# Patient Record
Sex: Male | Born: 1980
Health system: Southern US, Community
[De-identification: ages and names within clinical notes are randomized; demographics above are authoritative.]

## PROBLEM LIST (undated history)

## (undated) DIAGNOSIS — I428 Other cardiomyopathies: Secondary | ICD-10-CM

## (undated) DIAGNOSIS — R059 Cough, unspecified: Secondary | ICD-10-CM

## (undated) DIAGNOSIS — I480 Paroxysmal atrial fibrillation: Secondary | ICD-10-CM

## (undated) DIAGNOSIS — I509 Heart failure, unspecified: Secondary | ICD-10-CM

## (undated) DIAGNOSIS — I639 Cerebral infarction, unspecified: Secondary | ICD-10-CM

## (undated) DIAGNOSIS — Q2112 Patent foramen ovale: Secondary | ICD-10-CM

## (undated) DIAGNOSIS — Z87898 Personal history of other specified conditions: Secondary | ICD-10-CM

## (undated) DIAGNOSIS — R05 Cough: Secondary | ICD-10-CM

## (undated) DIAGNOSIS — Q211 Atrial septal defect: Secondary | ICD-10-CM

## (undated) HISTORY — DX: Other cardiomyopathies: I42.8

## (undated) HISTORY — DX: Cough, unspecified: R05.9

## (undated) HISTORY — PX: NO PAST SURGERIES: SHX2092

## (undated) HISTORY — DX: Paroxysmal atrial fibrillation: I48.0

## (undated) HISTORY — DX: Cough: R05

---
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
## 2001-01-20 ENCOUNTER — Encounter: Admission: RE | Admit: 2001-01-20 | Discharge: 2001-01-20 | Payer: Self-pay | Admitting: Family Medicine

## 2002-06-05 ENCOUNTER — Emergency Department (HOSPITAL_COMMUNITY): Admission: EM | Admit: 2002-06-05 | Discharge: 2002-06-06 | Payer: Self-pay | Admitting: Emergency Medicine

## 2002-06-06 ENCOUNTER — Encounter: Payer: Self-pay | Admitting: Emergency Medicine

## 2002-07-13 ENCOUNTER — Encounter: Admission: RE | Admit: 2002-07-13 | Discharge: 2002-07-13 | Payer: Self-pay | Admitting: Family Medicine

## 2002-07-20 ENCOUNTER — Encounter: Admission: RE | Admit: 2002-07-20 | Discharge: 2002-07-20 | Payer: Self-pay | Admitting: Sports Medicine

## 2003-11-14 ENCOUNTER — Encounter: Admission: RE | Admit: 2003-11-14 | Discharge: 2003-11-14 | Payer: Self-pay | Admitting: Family Medicine

## 2003-11-17 ENCOUNTER — Encounter: Admission: RE | Admit: 2003-11-17 | Discharge: 2003-11-17 | Payer: Self-pay | Admitting: Sports Medicine

## 2004-07-27 ENCOUNTER — Emergency Department (HOSPITAL_COMMUNITY): Admission: EM | Admit: 2004-07-27 | Discharge: 2004-07-27 | Payer: Self-pay | Admitting: Emergency Medicine

## 2005-02-15 ENCOUNTER — Emergency Department (HOSPITAL_COMMUNITY): Admission: EM | Admit: 2005-02-15 | Discharge: 2005-02-15 | Payer: Self-pay | Admitting: Emergency Medicine

## 2006-09-04 DIAGNOSIS — F909 Attention-deficit hyperactivity disorder, unspecified type: Secondary | ICD-10-CM | POA: Insufficient documentation

## 2008-12-19 ENCOUNTER — Ambulatory Visit: Payer: Self-pay | Admitting: Diagnostic Radiology

## 2008-12-19 ENCOUNTER — Emergency Department (HOSPITAL_BASED_OUTPATIENT_CLINIC_OR_DEPARTMENT_OTHER): Admission: EM | Admit: 2008-12-19 | Discharge: 2008-12-19 | Payer: Self-pay | Admitting: Emergency Medicine

## 2009-01-09 ENCOUNTER — Emergency Department (HOSPITAL_BASED_OUTPATIENT_CLINIC_OR_DEPARTMENT_OTHER): Admission: EM | Admit: 2009-01-09 | Discharge: 2009-01-09 | Payer: Self-pay | Admitting: Emergency Medicine

## 2009-05-22 ENCOUNTER — Emergency Department (HOSPITAL_BASED_OUTPATIENT_CLINIC_OR_DEPARTMENT_OTHER): Admission: EM | Admit: 2009-05-22 | Discharge: 2009-05-22 | Payer: Self-pay | Admitting: Emergency Medicine

## 2009-05-24 ENCOUNTER — Emergency Department (HOSPITAL_BASED_OUTPATIENT_CLINIC_OR_DEPARTMENT_OTHER): Admission: EM | Admit: 2009-05-24 | Discharge: 2009-05-24 | Payer: Self-pay | Admitting: Emergency Medicine

## 2009-05-24 ENCOUNTER — Ambulatory Visit: Payer: Self-pay | Admitting: Diagnostic Radiology

## 2010-10-10 LAB — BASIC METABOLIC PANEL
GFR calc non Af Amer: >60 mL/min (ref >60)
Glucose, Bld: 74 mg/dL (ref 70–99)
Potassium: 4.2 mEq/L (ref 3.5–5.1)
Sodium: 144 mEq/L (ref 135–145)

## 2010-10-10 LAB — CBC
HCT: 45.8 % (ref 39.0–52.0)
Hemoglobin: 15.3 g/dL (ref 13.0–17.0)
WBC: 6.6 10*3/uL (ref 4.0–10.5)

## 2010-10-10 LAB — DIFFERENTIAL
Eosinophils Relative: 1 % (ref 0–5)
Lymphocytes Relative: 24 % (ref 12–46)
Lymphs Abs: 1.6 10*3/uL (ref 0.7–4.0)
Monocytes Absolute: 0.5 10*3/uL (ref 0.1–1.0)

## 2010-10-10 LAB — POCT CARDIAC MARKERS
CKMB, poc: <1 ng/mL — ABNORMAL LOW (ref 1.0–8.0)
Troponin i, poc: <0.05 ng/mL (ref 0.00–0.09)

## 2011-02-03 ENCOUNTER — Emergency Department (HOSPITAL_BASED_OUTPATIENT_CLINIC_OR_DEPARTMENT_OTHER)
Admission: EM | Admit: 2011-02-03 | Discharge: 2011-02-03 | Disposition: A | Discharge status: Home / Self Care | Payer: 59 | Attending: Emergency Medicine | Admitting: Emergency Medicine

## 2011-02-03 ENCOUNTER — Encounter: Payer: Self-pay | Admitting: *Deleted

## 2011-02-03 DIAGNOSIS — R369 Urethral discharge, unspecified: Secondary | ICD-10-CM | POA: Insufficient documentation

## 2011-02-03 DIAGNOSIS — N342 Other urethritis: Secondary | ICD-10-CM | POA: Insufficient documentation

## 2011-02-03 LAB — URINE MICROSCOPIC-ADD ON

## 2011-02-03 LAB — URINALYSIS, ROUTINE W REFLEX MICROSCOPIC
Protein, ur: NEGATIVE mg/dL
Urobilinogen, UA: 1 mg/dL (ref 0.0–1.0)

## 2011-02-03 MED ORDER — LIDOCAINE HCL (PF) 1 % IJ SOLN
INTRAMUSCULAR | Status: AC
  Administered 2011-02-03: 5 mL via INTRAMUSCULAR
  Filled 2011-02-03: qty 5

## 2011-02-03 MED ORDER — CEFTRIAXONE SODIUM 250 MG IJ SOLR
250.0000 mg | Freq: Once | INTRAMUSCULAR | Status: AC
  Administered 2011-02-03: 250 mg via INTRAMUSCULAR
  Filled 2011-02-03: qty 250

## 2011-02-03 MED ORDER — AZITHROMYCIN 250 MG PO TABS
1000.0000 mg | ORAL_TABLET | Freq: Every day | ORAL | Status: DC
  Administered 2011-02-03 (×2): 1000 mg via ORAL
  Filled 2011-02-03: qty 4

## 2011-02-03 MED ORDER — AZITHROMYCIN 1 G PO PACK: 1.0000 g | PACK | Freq: Once | ORAL | Status: DC

## 2011-02-03 MED ORDER — SULFAMETHOXAZOLE-TRIMETHOPRIM 800-160 MG PO TABS: 1.0000 | ORAL_TABLET | Freq: Two times a day (BID) | ORAL | Status: AC

## 2011-02-03 NOTE — ED Provider Notes (Signed)
History     Chief Complaint  Patient presents with  . Penile Discharge   HPI Comments: Pt states that he has a burning sensation and a white discharge:pt states that he has a history of std, but this symptoms are different  Patient is a 30 y.o. male presenting with penile discharge. The history is provided by the patient.  Penile Discharge This is a new problem. The current episode started in the past 7 days. The problem occurs constantly. The problem has been unchanged. Pertinent negatives include no abdominal pain or fever. The symptoms are aggravated by nothing. He has tried nothing for the symptoms.    History reviewed. No pertinent past medical history.  History reviewed. No pertinent past surgical history.  No family history on file.  History  Substance Use Topics  . Smoking status: Never Smoker   . Smokeless tobacco: Not on file  . Alcohol Use: No      Review of Systems  Constitutional: Negative for fever.  Gastrointestinal: Negative for abdominal pain.  Genitourinary: Positive for discharge.  All other systems reviewed and are negative.    Physical Exam  BP 128/84  Pulse 80  Temp(Src) 98.4 F (36.9 C) (Oral)  Resp 20  SpO2 100%  Physical Exam  Nursing note and vitals reviewed. Constitutional: He appears well-developed and well-nourished.  Cardiovascular: Normal rate and regular rhythm.   Pulmonary/Chest: Effort normal and breath sounds normal.  Abdominal: Soft. Bowel sounds are normal.  Genitourinary: Penis normal. No penile tenderness. No discharge found.  Musculoskeletal: Normal range of motion.  Neurological: He is alert.  Skin: Skin is warm and dry.  Psychiatric: He has a normal mood and affect.    ED Course  Procedures  MDM Will treat pt for urethritis:no penile discharge noted:pt given instructions      Teressa Lower, NP 02/03/11 1813

## 2011-02-03 NOTE — ED Notes (Signed)
Pain in his penis x 1 week. Discharge.

## 2011-02-04 NOTE — ED Provider Notes (Signed)
Medical screening examination/treatment/procedure(s) were performed by non-physician practitioner and as supervising physician I was immediately available for consultation/collaboration.   Vedansh Kerstetter, MD 02/04/11 0000 

## 2011-02-05 LAB — GC/CHLAMYDIA PROBE AMP, GENITAL: GC Probe Amp, Genital: NEGATIVE

## 2011-02-07 NOTE — ED Notes (Signed)
Chart sent to EDP office for review of chlamydia culture results

## 2011-02-08 NOTE — ED Notes (Signed)
Message left to return call.

## 2011-02-08 NOTE — ED Notes (Signed)
Patient called and notified of results and instructions.  Zithromax 1 gram PO x 1 dose called into [facility] at [phone].

## 2011-05-27 ENCOUNTER — Emergency Department (HOSPITAL_BASED_OUTPATIENT_CLINIC_OR_DEPARTMENT_OTHER)
Admission: EM | Admit: 2011-05-27 | Discharge: 2011-05-27 | Disposition: A | Discharge status: Home / Self Care | Payer: 59 | Attending: Emergency Medicine | Admitting: Emergency Medicine

## 2011-05-27 ENCOUNTER — Encounter (HOSPITAL_BASED_OUTPATIENT_CLINIC_OR_DEPARTMENT_OTHER): Payer: Self-pay

## 2011-05-27 DIAGNOSIS — Z202 Contact with and (suspected) exposure to infections with a predominantly sexual mode of transmission: Secondary | ICD-10-CM | POA: Insufficient documentation

## 2011-05-27 DIAGNOSIS — N451 Epididymitis: Secondary | ICD-10-CM

## 2011-05-27 DIAGNOSIS — N453 Epididymo-orchitis: Secondary | ICD-10-CM | POA: Insufficient documentation

## 2011-05-27 MED ORDER — CIPROFLOXACIN HCL 500 MG PO TABS
500.0000 mg | ORAL_TABLET | Freq: Once | ORAL | Status: AC
  Administered 2011-05-27: 500 mg via ORAL
  Filled 2011-05-27: qty 1

## 2011-05-27 MED ORDER — AZITHROMYCIN 250 MG PO TABS
1000.0000 mg | ORAL_TABLET | Freq: Once | ORAL | Status: AC
  Administered 2011-05-27: 1000 mg via ORAL
  Filled 2011-05-27: qty 4

## 2011-05-27 MED ORDER — METRONIDAZOLE 500 MG PO TABS
2000.0000 mg | ORAL_TABLET | Freq: Once | ORAL | Status: AC
  Administered 2011-05-27: 2000 mg via ORAL
  Filled 2011-05-27: qty 4

## 2011-05-27 MED ORDER — CEFTRIAXONE SODIUM 250 MG IJ SOLR
250.0000 mg | Freq: Once | INTRAMUSCULAR | Status: AC
  Administered 2011-05-27: 250 mg via INTRAMUSCULAR
  Filled 2011-05-27: qty 250

## 2011-05-27 MED ORDER — CIPROFLOXACIN HCL 500 MG PO TABS: 500.0000 mg | ORAL_TABLET | Freq: Two times a day (BID) | ORAL | Status: AC

## 2011-05-27 NOTE — ED Provider Notes (Signed)
History  This chart was scribed for Dayton Bailiff, MD by Bennett Scrape. This patient was seen in room MH06/MH06 and the patient's care was started at 6:08PM.  CSN: [id number] Arrival date & time: 05/27/2011  5:57 PM   First MD Initiated Contact with Patient 05/27/11 1757      Chief Complaint  Patient presents with  . Exposure to STD    HPI Nathan Baird is a 30 y.o. male who presents to the Emergency Department complaining of an exposure to an STD that occurred a few days ago after an unprotected sexual encounter with a partner that has chlamydia. Pt describes having a constant pain in his testicles described as a squeezing discomfort. Pt denies having penile discharge. Pt states that sleeping improves the pain. Pt denies any other symptoms or injuries.  Some mild dysuria at the end of his stream   History reviewed. No pertinent past medical history.  History reviewed. No pertinent past surgical history.  No family history on file.  History  Substance Use Topics  . Smoking status: Never Smoker   . Smokeless tobacco: Not on file  . Alcohol Use: No     Review of Systems A complete 10 system review of systems was obtained and is otherwise negative except as noted in the HPI.   Allergies  Review of patient's allergies indicates no known allergies.  Home Medications   Current Outpatient Rx  Name Route Sig Dispense Refill  . CIPROFLOXACIN HCL 500 MG PO TABS Oral Take 1 tablet (500 mg total) by mouth 2 (two) times daily. 14 tablet 0    BP 129/84  Pulse 94  Temp(Src) 98.4 F (36.9 C) (Oral)  Resp 16  Ht 6\' 2"  (1.88 m)  Wt 231 lb (104.781 kg)  BMI 29.66 kg/m2  SpO2 100%  Physical Exam  Nursing note and vitals reviewed. Constitutional: He is oriented to person, place, and time. He appears well-developed and well-nourished.  HENT:  Head: Normocephalic and atraumatic.  Eyes: EOM are normal. Pupils are equal, round, and reactive to light.  Neck: Normal range of  motion. Neck supple.  Cardiovascular: Normal rate and regular rhythm.   Pulmonary/Chest: Effort normal and breath sounds normal.  Abdominal: Soft.  Genitourinary:       Mild left epidemal tenderness, no penile discharge  Musculoskeletal: Normal range of motion.  Neurological: He is alert and oriented to person, place, and time.  Skin: Skin is warm and dry.  Psychiatric: He has a normal mood and affect. His behavior is normal.    ED Course  Procedures (including critical care time)  DIAGNOSTIC STUDIES: Oxygen Saturation is 100% on room air, normal by my interpretation.    COORDINATION OF CARE: 6:11PM-Discussed medication and antibiotic treatment with patient at bedside and patient agreed to plan.      Labs Reviewed  GC/CHLAMYDIA PROBE AMP, GENITAL   No results found.   1. Exposure to STD   2. Epididymitis       MDM  Patient was treated for his STD exposure. I also feel he has a component of epididymitis. He was swabbed for GC and Chlamydia. I treated with ceftriaxone, azithromycin, Flagyl. He'll be discharged home on a short course of ciprofloxacin. He is instructed to followup with his primary care physician and inform his partners of his diagnosis.      I personally performed the services described in this documentation, which was scribed in my presence. The recorded information has been reviewed and considered.  Dayton Bailiff, MD 05/27/11 [phone]

## 2011-05-27 NOTE — ED Notes (Signed)
Pt reports penile discomfort after having unprotected sex.  He reports partner has chlamydia and trich.

## 2011-05-27 NOTE — ED Notes (Signed)
EDP King at bedside to assess

## 2011-05-27 NOTE — ED Notes (Signed)
Pt given Rx x 1 for cipro

## 2011-05-28 LAB — GC/CHLAMYDIA PROBE AMP, GENITAL
Chlamydia, DNA Probe: NEGATIVE
GC Probe Amp, Genital: NEGATIVE

## 2011-09-06 ENCOUNTER — Encounter (HOSPITAL_BASED_OUTPATIENT_CLINIC_OR_DEPARTMENT_OTHER): Payer: Self-pay | Admitting: *Deleted

## 2011-09-06 ENCOUNTER — Emergency Department (HOSPITAL_BASED_OUTPATIENT_CLINIC_OR_DEPARTMENT_OTHER)
Admission: EM | Admit: 2011-09-06 | Discharge: 2011-09-06 | Disposition: A | Discharge status: Home / Self Care | Payer: 59 | Attending: Emergency Medicine | Admitting: Emergency Medicine

## 2011-09-06 DIAGNOSIS — R300 Dysuria: Secondary | ICD-10-CM | POA: Insufficient documentation

## 2011-09-06 DIAGNOSIS — N342 Other urethritis: Secondary | ICD-10-CM | POA: Insufficient documentation

## 2011-09-06 DIAGNOSIS — R369 Urethral discharge, unspecified: Secondary | ICD-10-CM | POA: Insufficient documentation

## 2011-09-06 DIAGNOSIS — Z202 Contact with and (suspected) exposure to infections with a predominantly sexual mode of transmission: Secondary | ICD-10-CM | POA: Insufficient documentation

## 2011-09-06 LAB — GC/CHLAMYDIA PROBE AMP, GENITAL: Chlamydia, DNA Probe: NEGATIVE

## 2011-09-06 MED ORDER — AZITHROMYCIN 250 MG PO TABS
1000.0000 mg | ORAL_TABLET | Freq: Once | ORAL | Status: AC
  Administered 2011-09-06: 1000 mg via ORAL
  Filled 2011-09-06: qty 4

## 2011-09-06 MED ORDER — LIDOCAINE-EPINEPHRINE 2 %-1:100000 IJ SOLN
2.0000 mL | Freq: Once | INTRAMUSCULAR | Status: AC
  Administered 2011-09-06: 2 mL
  Filled 2011-09-06: qty 1

## 2011-09-06 MED ORDER — CEFTRIAXONE SODIUM 250 MG IJ SOLR
250.0000 mg | Freq: Once | INTRAMUSCULAR | Status: AC
  Administered 2011-09-06: 250 mg via INTRAMUSCULAR
  Filled 2011-09-06: qty 250

## 2011-09-06 NOTE — ED Notes (Signed)
Pt states that he was told by a partner that he was exposed to a STD denies DC but has penile pain

## 2011-09-06 NOTE — ED Notes (Signed)
Received pt. From triage, pt. Alert and oriented, NAD noted, pt. Denies open sores or penial discharge, awaiting EDP

## 2011-09-06 NOTE — ED Notes (Signed)
Pt. Alert and oriented, NAD noted, discharged to home, pt. Ambulatory gait steady,

## 2011-09-06 NOTE — Discharge Instructions (Signed)
You were treated with antibiotics in the ER which should cure your symptoms. The swab results will be back in 1-2 days. No sex until after symptoms have completed resolved, use condoms.  Follow up with primary care doctor in 1 week if symptoms fail to improve/resolve. Return to er if worse, severe pain, high fevers, other concern.  Have any sexual contacts checked by their doctor or health department.   Safer Sex Your caregiver wants you to have this information about the infections that can be transmitted from sexual contact and how to prevent them. The idea behind safer sex is that you can be sexually active, and at the same time reduce the risk of giving or getting a sexually transmitted disease (STD). Every person should be aware of how to prevent him or herself and his or her sex partner from getting an STD. CAUSES OF STDS STDs are transmitted by sharing body fluids, which contain viruses and bacteria. The following fluids all transmit infections during sexual intercourse and sex acts:  Semen.   Saliva.   Urine.   Blood.   Vaginal mucus.  Examples of STDs include:  Chlamydia.   Gonorrhea.   Genital herpes.   Hepatitis B.   Human immunodeficiency virus or acquired immunodeficiency syndrome (HIV or AIDS).   Syphilis.   Trichomonas.   Pubic lice.   Human papillomavirus (HPV), which may include:   Genital warts.   Cervical dysplasia.   Cervical cancer (can develop with certain types of HPV).  SYMPTOMS  Sexual diseases often cause few or no symptoms until they are advanced, so a person can be infected and spread the infection without knowing it. Some STDs respond to treatment very well. Others, like HIV and herpes, cannot be cured, but are treated to reduce their effects. Specific symptoms include:  Abnormal vaginal discharge.   Irritation or itching in and around the vagina, and in the pubic hair.   Pain during sexual intercourse.   Bleeding during sexual  intercourse.   Pelvic or abdominal pain.   Fever.   Growths in and around the vagina.   An ulcer in or around the vagina.   Swollen glands in the groin area.  DIAGNOSIS   Blood tests.   Pap test.   Culture test of abnormal vaginal discharge.   A test that applies a solution and examines the cervix with a lighted magnifying scope (colposcopy).   A test that examines the pelvis with a lighted tube, through a small incision (laparoscopy).  TREATMENT  The treatment will depend on the cause of the STD.  Antibiotic treatment by injection, oral, creams, or suppositories in the vagina.   Over-the-counter medicated shampoo, to get rid of pubic lice.   Removing or treating growths with medicine, freezing, burning (electrocautery), or surgery.   Surgery treatment for HPV of the cervix.   Supportive medicines for herpes, HIV, AIDS, and hepatitis.  Being careful cannot eliminate all risk of infection, but sex can be made much safer. Safe sexual practices include body massage and gentle touching. Masturbation is safe, as long as body fluids do not contact skin that has sores or cuts. Dry kissing and oral sex on a man wearing a latex condom or on a woman wearing a male condom is also safe. Slightly less safe is intercourse while the man wears a latex condom or wet kissing. It is also safer to have one sex partner that you know is not having sex with anyone else. LENGTH OF ILLNESS An  STD might be treated and cured in a week, sometimes a month, or more. And it can linger with symptoms for many years. STDs can also cause damage to the male organs. This can cause chronic pain, infertility, and recurrence of the STD, especially herpes, hepatitis, HIV, and HPV. HOME CARE INSTRUCTIONS AND PREVENTION  Alcohol and recreational drugs are often the reason given for not practicing safer sex. These substances affect your judgment. Alcohol and recreational drugs can also impair your immune system,  making you more vulnerable to disease.   Do not engage in risky and dangerous sexual practices, including:   Vaginal or anal sex without a condom.   Oral sex on a man without a condom.   Oral sex on a woman without a male condom.   Using saliva to lubricate a condom.   Any other sexual contact in which body fluids or blood from one partner contact the other partner.   You should use only latex condoms for men and water soluble lubricants. Petroleum based lubricants or oils used to lubricate a condom will weaken the condom and increase the chance that it will break.   Think very carefully before having sex with anyone who is high risk for STDs and HIV. This includes IV drug users, people with multiple sexual partners, or people who have had an STD, or a positive hepatitis or HIV blood test.   Remember that even if your partner has had only one previous partner, their previous partner might have had multiple partners. If so, you are at high risk of being exposed to an STD. You and your sex partner should be the only sex partners with each other, with no one else involved.   A vaccine is available for hepatitis B and HPV through your caregiver or the Public Health Department. Everyone should be vaccinated with these vaccines.   Avoid risky sex practices. Sex acts that can break the skin make you more likely to get an STD.  SEEK MEDICAL CARE IF:   If you think you have an STD, even if you do not have any symptoms. Contact your caregiver for evaluation and treatment, if needed.   You think or know your sex partner has acquired an STD.   You have any of the symptoms mentioned above.  Document Released: 08/01/2004 Document Revised: 03/06/2011 Document Reviewed: 05/24/2009 [facility] Patient Information 2012 [place], [place].    Urethritis, Adult Urethritis is an inflammation (soreness) of the urethra (the tube exiting from the bladder). It is often caused by germs that may be spread  through sexual contact. TREATMENT  Urethritis will usually respond to antibiotics. These are medications that kill germs. Take all the medicine given to you. You may feel better in a couple days, but TAKE ALL MEDICINE or the infection may not be completely cured and may become more difficult to treat. Response can generally be expected in 7 to 10 days. You may require additional treatment after more testing. HOME CARE INSTRUCTIONS  Not have sex until the test results are known and treatment is completed.   Know that you may be asked to notify your sex partner when your final test results are back.   Finish all medications as prescribed.   Prevent sexually transmitted infections including AIDS. Practice safe sex. Use condoms.  SEEK MEDICAL CARE IF:   Your symptoms are not improved in 2 to 3 days.   Your symptoms are getting worse.   Your develop abdominal pain.   You develop  joint pain.  SEEK IMMEDIATE MEDICAL CARE IF:   You have a fever.   You develop severe pain in the belly, back or side.   You develop repeated vomiting.  TEST RESULTS Not all test results are available during your visit. If your test results are not back during the visit, make an appointment with your caregiver to find out the results. Do not assume everything is normal if you have not heard from your caregiver or the medical facility. It is important for you to follow-up on all of your test results. Document Released: 12/18/2000 Document Revised: 03/06/2011 Document Reviewed: 07/10/2009 [facility] Patient Information 2012 [place], [place].

## 2011-09-06 NOTE — ED Provider Notes (Signed)
History     CSN: [id number]  Arrival date & time 09/06/11  0047   First MD Initiated Contact with Patient 09/06/11 0203      Chief Complaint  Patient presents with  . Exposure to STD    (Consider location/radiation/quality/duration/timing/severity/associated sxs/prior treatment) Patient is a 31 y.o. male presenting with STD exposure. The history is provided by the patient.  Exposure to STD Pertinent negatives include no abdominal pain.  pt states was recently told by sex partner that he was exposed to an std in past week, unsure of name. States notes pain at end of penis w burning sensation and mild discharge. No scrotal or testicular pain. No abd pain or nv. No fever or chills. No rash.   History reviewed. No pertinent past medical history.  History reviewed. No pertinent past surgical history.  History reviewed. No pertinent family history.  History  Substance Use Topics  . Smoking status: Never Smoker   . Smokeless tobacco: Not on file  . Alcohol Use: No      Review of Systems  Constitutional: Negative for fever and chills.  Gastrointestinal: Negative for vomiting and abdominal pain.  Genitourinary: Positive for discharge.  Skin: Negative for rash and wound.    Allergies  Review of patient's allergies indicates no known allergies.  Home Medications  No current outpatient prescriptions on file.  BP 127/77  Pulse 85  Temp(Src) 98.4 F (36.9 C) (Oral)  Resp 16  SpO2 100%  Physical Exam  Nursing note and vitals reviewed. Constitutional: He is oriented to person, place, and time. He appears well-developed and well-nourished. No distress.  HENT:  Head: Atraumatic.  Eyes: Pupils are equal, round, and reactive to light.  Neck: Neck supple. No tracheal deviation present.  Cardiovascular: Normal rate.   Pulmonary/Chest: Effort normal. No accessory muscle usage. No respiratory distress.  Abdominal: Soft. He exhibits no distension. There is no tenderness.    Genitourinary:       Penile discharge. No scrotal or testicle pain, swelling or tenderness. No skin lesions or ulcers noted.   Musculoskeletal: Normal range of motion.  Neurological: He is alert and oriented to person, place, and time.  Skin: Skin is warm and dry. No rash noted.  Psychiatric: He has a normal mood and affect.    ED Course  Procedures (including critical care time)   Labs Reviewed  GC/CHLAMYDIA PROBE AMP, GENITAL     MDM  Rocephin im, zitrhomax po.         Suzi Roots, MD 09/06/11 [phone]

## 2011-11-17 ENCOUNTER — Ambulatory Visit (INDEPENDENT_AMBULATORY_CARE_PROVIDER_SITE_OTHER): Payer: 59 | Admitting: Family Medicine

## 2011-11-17 ENCOUNTER — Ambulatory Visit: Payer: 59

## 2011-11-17 VITALS — BP 109/78 | HR 65 | Temp 98.1°F | Resp 20 | Ht 74.0 in | Wt 239.4 lb

## 2011-11-17 DIAGNOSIS — S336XXA Sprain of sacroiliac joint, initial encounter: Secondary | ICD-10-CM

## 2011-11-17 DIAGNOSIS — M533 Sacrococcygeal disorders, not elsewhere classified: Secondary | ICD-10-CM

## 2011-11-17 MED ORDER — METHYLPREDNISOLONE 4 MG PO KIT: PACK | ORAL | Status: AC

## 2011-11-17 NOTE — Patient Instructions (Signed)
Sacroiliac Joint Dysfunction The sacroiliac joint connects the lower part of the spine (the sacrum) with the bones of the pelvis. CAUSES  Sometimes, there is no obvious reason for sacroiliac joint dysfunction. Other times, it may occur   During pregnancy.   After injury, such as:   Car accidents.   Sport-related injuries.   Work-related injuries.   Due to one leg being shorter than the other.   Due to other conditions that affect the joints, such as:   Rheumatoid arthritis.   Gout.   Psoriasis.   Joint infection (septic arthritis).  SYMPTOMS  Symptoms may include:  Pain in the:   Lower back.   Buttocks.   Groin.   Thighs and legs.   Difficult sitting, standing, walking, lying, bending or lifting.  DIAGNOSIS  A number of tests may be used to help diagnose the cause of sacroiliac joint dysfunction, including:  Imaging tests to look for other causes of pain, including:   MRI.   CT scan.   Bone scan.   Diagnostic injection: During a special x-ray (called fluoroscopy), a needle is put into the sacroiliac joint. A numbing medicine is injected into the joint. If the pain is improved or stopped, the diagnosis of sacroiliac joint dysfunction is more likely.  TREATMENT  There are a number of types of treatment used for sacroiliac joint dysfunction, including:  Only take over-the-counter or prescription medicines for pain, discomfort, or fever as directed by your caregiver.   Medications to relax muscles.   Rest. Decreasing activity can help cut down on painful muscle spasms and allow the back to heal.   Application of heat or ice to the lower back may improve muscle spasms and soothe pain.   Brace. A special back brace, called a sacroiliac belt, can help support the joint while your back is healing.   Physical therapy can help teach comfortable positions and exercises to strengthen muscles that support the sacroiliac joint.   Cortisone injections. Injections  of steroid medicine into the joint can help decrease swelling and improve pain.   Hyaluronic acid injections. This chemical improves lubrication within the sacroiliac joint, thereby decreasing pain.   Radiofrequency ablation. A special needle is placed into the joint, where it burns away nerves that are carrying pain messages from the joint.   Surgery. Because pain occurs during movement of the joint, screws and plates may be installed in order to limit or prevent joint motion.  HOME CARE INSTRUCTIONS   Take all medications exactly as directed.   Follow instructions regarding both rest and physical activity, to avoid worsening the pain.   Do physical therapy exercises exactly as prescribed.  SEEK IMMEDIATE MEDICAL CARE IF:  You experience increasingly severe pain.   You develop new symptoms, such as numbness or tingling in your legs or feet.   You lose bladder or bowel control.  Document Released: 09/20/2008 Document Revised: 06/13/2011 Document Reviewed: 09/20/2008 [employer] Patient Information 2012 [employer]. 

## 2011-11-17 NOTE — Progress Notes (Signed)
This 31 year old gentleman who fell flat on his back and playing basketball 4-6 weeks ago. He's had continued pain ever since. He works doing roof work with asphalt and has been able to continue working with only intermittent twinges of sharp pain. Nevertheless, he's had sharp pain whenever he sits for a long time on his left buttock, when he jumps or runs, although he tries to sleep at night.  Patient had no bowel or bladder problems no fever, no sensory loss in the left leg, no radiculopathy in the left leg or motor weakness.  Objective: Sacroiliac area not particularly tender.  The crossover exam negative.  Neurological: Normal motor and sensory left leg  Leg raising: Negative left side  Hip on the left: Full range of motion without pain [facility] reading (PRIMARY) by  Dr. Milus Glazier:  L/S spine films  Negative  Assessment: This patient has an SI inflammation because of the sharp intermittent nature of his pain which worsens with weightbearing such as jogging.  Plan: Medrol dose pack.

## 2012-02-10 ENCOUNTER — Ambulatory Visit (INDEPENDENT_AMBULATORY_CARE_PROVIDER_SITE_OTHER): Payer: 59 | Admitting: Family Medicine

## 2012-02-10 VITALS — BP 128/92 | HR 92 | Temp 98.1°F | Resp 14 | Ht 73.0 in | Wt 239.8 lb

## 2012-02-10 DIAGNOSIS — Z202 Contact with and (suspected) exposure to infections with a predominantly sexual mode of transmission: Secondary | ICD-10-CM

## 2012-02-10 DIAGNOSIS — Z9189 Other specified personal risk factors, not elsewhere classified: Secondary | ICD-10-CM

## 2012-02-10 MED ORDER — METRONIDAZOLE 500 MG PO TABS: ORAL_TABLET | ORAL | Status: DC

## 2012-02-10 MED ORDER — AZITHROMYCIN 250 MG PO TABS: ORAL_TABLET | ORAL | Status: AC

## 2012-02-10 NOTE — Progress Notes (Signed)
31 yo male whose partner reports trichamonas and chlamydia test positive.  No symprotms  Objective:  Normal circ genitalia  Assessment:  STD exposure  Plan:  Azithromycin and Flagyl

## 2012-05-19 ENCOUNTER — Ambulatory Visit (INDEPENDENT_AMBULATORY_CARE_PROVIDER_SITE_OTHER): Payer: 59 | Admitting: Family Medicine

## 2012-05-19 VITALS — BP 130/86 | HR 86 | Temp 98.7°F | Resp 16 | Ht 74.0 in | Wt 245.0 lb

## 2012-05-19 DIAGNOSIS — N4889 Other specified disorders of penis: Secondary | ICD-10-CM | POA: Insufficient documentation

## 2012-05-19 DIAGNOSIS — N489 Disorder of penis, unspecified: Secondary | ICD-10-CM

## 2012-05-19 MED ORDER — CEFTRIAXONE SODIUM 1 G IJ SOLR
250.0000 mg | Freq: Once | INTRAMUSCULAR | Status: AC
  Administered 2012-05-19: 250 mg via INTRAMUSCULAR

## 2012-05-19 MED ORDER — AZITHROMYCIN 500 MG PO TABS: 1000.0000 mg | ORAL_TABLET | Freq: Once | ORAL | Status: DC

## 2012-05-19 NOTE — Patient Instructions (Addendum)
Thank you for coming in today. We will test for  HIV,  Syphilis Gonorrhea Chlamydia  We're treating for syphilis gonorrhea and Chlamydia.  We will call you with the test results. Please take the 2 azithromycin pills I called in to your pharmacy Come back as needed.

## 2012-05-19 NOTE — Progress Notes (Signed)
Nathan Baird is a 31 y.o. male who presents to [facility] today for penile and lower abdominal pain present for the last several days.  Patient notes recent unprotected sex with a new partner.  He notes pain at the end of urination that radiates from his lower abdomen to his penis. He denies any discharge fever chills or significant abdominal pain. Additionally he denies any nausea vomiting or diarrhea.  He feels well otherwise.  This is consistent with prior episodes of STDs.  He has sex with women exclusively.   PMH: Reviewed otherwise healthy  History  Substance Use Topics  . Smoking status: Never Smoker   . Smokeless tobacco: Not on file  . Alcohol Use: No   ROS as above  Medications reviewed. Current Outpatient Prescriptions  Medication Sig Dispense Refill  . azithromycin (ZITHROMAX) 500 MG tablet Take 2 tablets (1,000 mg total) by mouth once.  2 tablet  0   Current Facility-Administered Medications  Medication Dose Route Frequency Provider Last Rate Last Dose  . [COMPLETED] cefTRIAXone (ROCEPHIN) injection 250 mg  250 mg Intramuscular Once Nathan Najjar, MD   250 mg at 05/19/12 1829    Exam:  BP 130/86  Pulse 86  Temp 98.7 F (37.1 C)  Resp 16  Ht 6\' 2"  (1.88 m)  Wt 245 lb (111.131 kg)  BMI 31.46 kg/m2 Gen: Well NAD Lungs: CTABL Nl WOB Heart: RRR no MRG Abd: NABS, NT, ND Exts: Non edematous BL  LE, warm and well perfused.  Genitals: Normal appearing circumcised penis nontender with no discharge. Normal testicles nontender. No hernias present bilaterally.   No results found for this or any previous visit (from the past 72 hour(s)).  Assessment and Plan: 31 y.o. male with penile pain. Worrisome for STD with recent exposure. Plan: Discussed options.  Plan for urine application of gonorrhea, Chlamydia. Blood HIV and RPR lab Empiric treatment with IM ceftriaxone 250 mg and oral azithromycin prescription for 1000 mg. Will call patient with results.

## 2012-05-20 LAB — HIV ANTIBODY (ROUTINE TESTING W REFLEX): HIV: NONREACTIVE

## 2012-05-20 LAB — RPR

## 2012-05-21 LAB — GC PROBE AMPLIFICATION, URINE: GC Probe Amp, Urine: NEGATIVE

## 2012-05-26 ENCOUNTER — Encounter: Payer: Self-pay | Admitting: *Deleted

## 2012-05-28 ENCOUNTER — Ambulatory Visit: Payer: 59

## 2012-05-28 ENCOUNTER — Ambulatory Visit (INDEPENDENT_AMBULATORY_CARE_PROVIDER_SITE_OTHER): Payer: 59 | Admitting: Family Medicine

## 2012-05-28 VITALS — BP 112/79 | HR 78 | Temp 98.8°F | Resp 18 | Wt 245.0 lb

## 2012-05-28 DIAGNOSIS — R05 Cough: Secondary | ICD-10-CM

## 2012-05-28 DIAGNOSIS — R079 Chest pain, unspecified: Secondary | ICD-10-CM

## 2012-05-28 DIAGNOSIS — R059 Cough, unspecified: Secondary | ICD-10-CM

## 2012-05-28 DIAGNOSIS — Z8489 Family history of other specified conditions: Secondary | ICD-10-CM

## 2012-05-28 DIAGNOSIS — Z832 Family history of diseases of the blood and blood-forming organs and certain disorders involving the immune mechanism: Secondary | ICD-10-CM

## 2012-05-28 DIAGNOSIS — R053 Chronic cough: Secondary | ICD-10-CM

## 2012-05-28 LAB — POCT CBC
Granulocyte percent: 49.7 % (ref 37–80)
HCT, POC: 42.8 % — AB (ref 43.5–53.7)
Hemoglobin: 13.5 g/dL — AB (ref 14.1–18.1)
Lymph, poc: 3 (ref 0.6–3.4)
MCH, POC: 29.2 pg (ref 27–31.2)
MCHC: 31.5 g/dL — AB (ref 31.8–35.4)
MCV: 92.5 fL (ref 80–97)
MID (cbc): 0.6 (ref 0–0.9)
MPV: 8.1 fL (ref 0–99.8)
POC Granulocyte: 3.6 (ref 2–6.9)
POC LYMPH PERCENT: 41.8 % (ref 10–50)
POC MID %: 8.5 % (ref 0–12)
Platelet Count, POC: 317 10*3/uL (ref 142–424)
RBC: 4.63 M/uL — AB (ref 4.69–6.13)
RDW, POC: 13.5 %
WBC: 7.2 10*3/uL (ref 4.6–10.2)

## 2012-05-28 MED ORDER — BENZONATATE 100 MG PO CAPS
200.0000 mg | ORAL_CAPSULE | Freq: Two times a day (BID) | ORAL | Status: AC | PRN
Start: 2012-05-28 — End: 2012-06-04

## 2012-05-28 MED ORDER — TRAMADOL HCL 50 MG PO TABS: 50.0000 mg | ORAL_TABLET | Freq: Three times a day (TID) | ORAL | Status: DC | PRN

## 2012-05-28 MED ORDER — ALBUTEROL SULFATE HFA 108 (90 BASE) MCG/ACT IN AERS: 2.0000 | INHALATION_SPRAY | Freq: Four times a day (QID) | RESPIRATORY_TRACT | Status: DC | PRN

## 2012-05-28 MED ORDER — NAPROXEN 500 MG PO TABS: 500.0000 mg | ORAL_TABLET | Freq: Two times a day (BID) | ORAL | Status: DC

## 2012-05-28 NOTE — Progress Notes (Signed)
[facility]:  Office Visit  Chief Complaint:  Chief Complaint  Patient presents with  . Chest Pain    fell on chest  . Shoulder Pain    HPI: Nathan Baird is a 31 y.o. male who complains of  3 day history of shoulder and chest pain after wrestling with friend. Patient picked him up and threw him down. He then started having CP, denies pain without movement or with shallow breaths, He now has 10/10  sharp pain and pain with deep inspiration. No prior injuries to chest wall. Deneis fevers, chills. Worse with cough. Worse with movement.   Works around [employer],  + productive cough of  white mucus. Multiple episodes of URI sxs ie cough and SOB  in last 1 1/2 which has forced him into using inhaler more frequently. In the past year 5 months out of 12 he has had cough sxs worse at night. 2 x daily and then 3 x a night. Cough does not wake him up. If he plays basketball he is ok but when he goes home he has the sxs. No molds, no pets. No known allergies. Denies any h/o allergies/asthma as child  Mom has end stage sarcoidosis, she is on oxygen,. He denies any skin changes, arthritis, fatigue, night sweats,no rashes, vision changes.   History reviewed. No pertinent past medical history. History reviewed. No pertinent past surgical history. History   Social History  . Marital Status: Single    Spouse Name: N/A    Number of Children: N/A  . Years of Education: N/A   Social History Main Topics  . Smoking status: Never Smoker   . Smokeless tobacco: None  . Alcohol Use: No  . Drug Use: No  . Sexually Active:    Other Topics Concern  . None   Social History Narrative  . None   Family History  Problem Relation Age of Onset  . Sarcoidosis Mother    No Known Allergies Prior to Admission medications   Medication Sig Start Date End Date Taking? Authorizing Provider  azithromycin (ZITHROMAX) 500 MG tablet Take 2 tablets (1,000 mg total) by mouth once. 05/19/12   Rodolph Bong, MD     ROS: The patient denies fevers, chills, night sweats, unintentional weight loss, , palpitations,  nausea, vomiting, abdominal pain, dysuria, hematuria, melena, numbness, weakness, or tingling.  All other systems have been reviewed and were otherwise negative with the exception of those mentioned in the HPI and as above.    PHYSICAL EXAM: Filed Vitals:   05/28/12 1623  BP: 112/79  Pulse: 78  Temp: 98.8 F (37.1 C)  Resp: 18   Filed Vitals:   05/28/12 1623  Weight: 245 lb (111.131 kg)   There is no height on file to calculate BMI.  General: Alert, no acute distress HEENT:  Normocephalic, atraumatic, oropharynx patent. EOMI, PERRLA, no exudates, slightly erythematous throat. Tm nl. No sinus tenderness Cardiovascular:  Regular rate and rhythm, no rubs murmurs or gallops.  No Carotid bruits, radial pulse intact. No pedal edema.  Respiratory: Clear to auscultation bilaterally.  No wheezes, rales, or rhonchi.  No cyanosis, no use of accessory musculature GI: No organomegaly, abdomen is soft and non-tender, positive bowel sounds.  No masses. Skin: No rashes. Neurologic: Facial musculature symmetric. Psychiatric: Patient is appropriate throughout our interaction. Lymphatic: No cervical lymphadenopathy Musculoskeletal: Gait intact. Tender on palpation of upper left chest wall in area above nipple and under clavicle   LABS:  Results for orders placed in visit on 05/28/12  POCT CBC      Component Value Range   WBC 7.2  4.6 - 10.2 K/uL   Lymph, poc 3.0  0.6 - 3.4   POC LYMPH PERCENT 41.8  10 - 50 %L   MID (cbc) 0.6  0 - 0.9   POC MID % 8.5  0 - 12 %M   POC Granulocyte 3.6  2 - 6.9   Granulocyte percent 49.7  37 - 80 %G   RBC 4.63 (*) 4.69 - 6.13 M/uL   Hemoglobin 13.5 (*) 14.1 - 18.1 g/dL   HCT, POC 98.1 (*) 19.1 - 53.7 %   MCV 92.5  80 - 97 fL   MCH, POC 29.2  27 - 31.2 pg   MCHC 31.5 (*) 31.8 - 35.4 g/dL   RDW, POC 47.8     Platelet Count, POC 317  142 - 424  K/uL   MPV 8.1  0 - 99.8 fL    EKG/XRAY:   Primary read interpreted by Dr. Conley Rolls at [facility]. No pneumothorax, cardiomegaly,  stable chronic increase interstitial markings compared to 05/24/2009 cxr ? Left 5th rib xray abnormality  at sternal/rib border or normal variant  ASSESSMENT/PLAN: Encounter Diagnoses  Name Primary?  . Chest pain Yes  . Chronic cough   . Family history of sarcoidosis    Most likely pleuritic chest pain due to rib contusion from wrestling with his friend, worse with coughing and deep breaths I did not see e/o pneumothroax or obvious fractures. He did have palpable tenderness on chest wall c/w costochondritis as well Looking back at old xrays he has had chronic interstitial changes, I am going to get an ACE level due to family h/o sarcoidosis and this on going cough. ? Also possible related to irritant at work/home. Rx Tessalon Perles, albuterol inh, naproxen and tramadol If no improvement in next 2-4 weeks or if need to use inhaler consistently more frequent then follow-up for spirometry.  May need referral to pulmonology  Rockne Coons, DO 05/29/2012 2:29 PM

## 2012-05-29 ENCOUNTER — Encounter: Payer: Self-pay | Admitting: Family Medicine

## 2012-05-29 LAB — ANGIOTENSIN CONVERTING ENZYME: Angiotensin-Converting Enzyme: 47 U/L (ref 8–52)

## 2012-06-04 ENCOUNTER — Encounter: Payer: Self-pay | Admitting: Family Medicine

## 2012-09-09 ENCOUNTER — Encounter: Payer: Self-pay | Admitting: Family Medicine

## 2012-09-09 ENCOUNTER — Ambulatory Visit (INDEPENDENT_AMBULATORY_CARE_PROVIDER_SITE_OTHER): Payer: 59 | Admitting: Family Medicine

## 2012-09-09 VITALS — BP 112/78 | HR 74 | Temp 98.4°F | Resp 16 | Ht 73.5 in | Wt 248.4 lb

## 2012-09-09 LAB — PULMONARY FUNCTION TEST

## 2012-09-09 MED ORDER — AZITHROMYCIN 250 MG PO TABS: ORAL_TABLET | ORAL | Status: DC

## 2012-09-09 MED ORDER — ALBUTEROL SULFATE (2.5 MG/3ML) 0.083% IN NEBU: 2.5000 mg | INHALATION_SOLUTION | Freq: Once | RESPIRATORY_TRACT | Status: DC

## 2012-09-09 MED ORDER — OMEPRAZOLE 20 MG PO CPDR: 20.0000 mg | DELAYED_RELEASE_CAPSULE | Freq: Every day | ORAL | Status: DC

## 2012-09-09 MED ORDER — BENZONATATE 100 MG PO CAPS: ORAL_CAPSULE | ORAL | Status: DC

## 2012-09-09 NOTE — Progress Notes (Signed)
[address]   [place], [place]  [address]   [phone]  Subjective:    Patient ID: Nathan Baird, male    DOB: [birth year], 32 y.o.   MRN: [id number]  HPI This 32 y.o. male presents for evaluation of the following:  1.  Cough:  Three month follow-up for cough.  Horrible cough at night.  Cough is outrageous; must drink a large amount of juice to help with cough.  Coughing a lot throughout the day.  Hears congestion with cough or breath.  Feels wheezing in upper airways.  Onset 1.5 years ago.  Has had previous visit.  Something is not right.  S/p CXR stable.  Mother with sarcoidosis; ACE level normal 05/2012.  Mucous has changed colors in last week.  Cough is terrible at night.  No rhinorrhea; no nasal congestion; +PND chronic.  Mucous is thicker in past week.  No fever/chills/sweats; no malaise/fatigue.  Substernal chest tightness and soreness.  Last night, chest tightness was horrible.  Intermittent SOB.  Works around [employer] for past seven years; cough started 1.5 years ago.  Symptoms are progressing.  Prescribed Albuterol without improvement.  The only thing that helped with cough is [employer].  No heartburn, indigestion, belching.  No family history of asthma; no personal history of asthma.  Playing basketball, no cough. While at work, still has cough but not nearly as bad as nighttime.  Cough is worsening.  No pets.  Current home x 8 months.  Cough was occurring before moving.     Review of Systems  Constitutional: Negative for fever, chills, diaphoresis and fatigue.  HENT: Positive for postnasal drip. Negative for ear pain, congestion, sore throat, rhinorrhea, sneezing, trouble swallowing and voice change.   Respiratory: Positive for cough, chest tightness, shortness of breath and wheezing.   Cardiovascular: Positive for chest pain. Negative for palpitations and leg swelling.  Gastrointestinal: Negative for nausea, vomiting and abdominal pain.  Allergic/Immunologic: Negative for  environmental allergies and immunocompromised state.        History reviewed. No pertinent past medical history.  History reviewed. No pertinent past surgical history.  Prior to Admission medications   Medication Sig Start Date End Date Taking? Authorizing Provider  albuterol (PROVENTIL HFA;VENTOLIN HFA) 108 (90 BASE) MCG/ACT inhaler Inhale 2 puffs into the lungs every 6 (six) hours as needed for wheezing. 05/28/12   Thao P Le, DO  naproxen (NAPROSYN) 500 MG tablet Take 1 tablet (500 mg total) by mouth 2 (two) times daily with a meal. 05/28/12   Thao P Le, DO  traMADol (ULTRAM) 50 MG tablet Take 1 tablet (50 mg total) by mouth every 8 (eight) hours as needed for pain. 05/28/12   Thao P Le, DO    No Known Allergies  History   Social History  . Marital Status: Single    Spouse Name: N/A    Number of Children: N/A  . Years of Education: N/A   Occupational History  . Not on file.   Social History Main Topics  . Smoking status: Never Smoker   . Smokeless tobacco: Not on file  . Alcohol Use: No  . Drug Use: No  . Sexually Active:    Other Topics Concern  . Not on file   Social History Narrative   Marital status: single     Children: 3 children (11, 1, 64month old)      Lives: with girlfriend.      Employment: works for city of [employer] exposure  Tobacco: none      Alcohol: none      Drugs: none      Exercise: basketball every Friday night.    Family History  Problem Relation Age of Onset  . Sarcoidosis Mother     Objective:   Physical Exam  Nursing note and vitals reviewed. Constitutional: He is oriented to person, place, and time. He appears well-developed and well-nourished. No distress.  HENT:  Head: Normocephalic and atraumatic.  Right Ear: External ear normal.  Left Ear: External ear normal.  Nose: Nose normal.  Mouth/Throat: Oropharynx is clear and moist.  Eyes: Conjunctivae and EOM are normal. Pupils are equal, round, and reactive to  light.  Neck: Normal range of motion. Neck supple. No thyromegaly present.  Cardiovascular: Normal rate, regular rhythm and normal heart sounds.  Exam reveals no gallop and no friction rub.   No murmur heard. Pulmonary/Chest: Effort normal and breath sounds normal. No respiratory distress. He has no wheezes. He has no rales.  Lymphadenopathy:    He has no cervical adenopathy.  Neurological: He is alert and oriented to person, place, and time.  Skin: He is not diaphoretic.  Psychiatric: He has a normal mood and affect. His behavior is normal.    PRE-NEBULIZER SPIROMETRY:  FVC 71%, FEV1  73%, FEV1/FVC%  103% -- RESTRICTIVE PATTERN.  ALBUTEROL NEBULIZER ADMINISTERED DURING VISIT.  POST-NEBULIZER SPIROMETRY:  FVC  71%, FEV1  74%,  FEV1/FVC% 104%      Assessment & Plan:  Chronic cough - Plan: Ambulatory referral to Pulmonology, Spirometry with graph, albuterol (PROVENTIL) (2.5 MG/3ML) 0.083% nebulizer solution 2.5 mg  Acute bronchitis - Plan: Spirometry with graph, albuterol (PROVENTIL) (2.5 MG/3ML) 0.083% nebulizer solution 2.5 mg    1.  Chronic Cough: Persistent; worse at nighttime.  Minimal exertional or exercise related symptoms.  S/p Albuterol nebulizer in office with improved areation. 2.  Acute bronchitis:  New.  Treat with Zpack, Tessalon Perles.  Meds ordered this encounter  Medications  . albuterol (PROVENTIL) (2.5 MG/3ML) 0.083% nebulizer solution 2.5 mg    Sig:   . azithromycin (ZITHROMAX Z-PAK) 250 MG tablet    Sig: Two tablets daily x 1 day then one tablet daily x 4 days    Dispense:  6 each    Refill:  0  . benzonatate (TESSALON) 100 MG capsule    Sig: 1-2 perles tid PRN cough    Dispense:  60 capsule    Refill:  0  . omeprazole (PRILOSEC) 20 MG capsule    Sig: Take 1 capsule (20 mg total) by mouth daily.    Dispense:  30 capsule    Refill:  3

## 2012-09-09 NOTE — Patient Instructions (Addendum)
Chronic cough - Plan: Ambulatory referral to Pulmonology, Spirometry with graph, albuterol (PROVENTIL) (2.5 MG/3ML) 0.083% nebulizer solution 2.5 mg, omeprazole (PRILOSEC) 20 MG capsule  Acute bronchitis - Plan: Spirometry with graph, albuterol (PROVENTIL) (2.5 MG/3ML) 0.083% nebulizer solution 2.5 mg, azithromycin (ZITHROMAX Z-PAK) 250 MG tablet, benzonatate (TESSALON) 100 MG capsule

## 2012-09-29 ENCOUNTER — Other Ambulatory Visit: Payer: Self-pay | Admitting: Occupational Medicine

## 2012-09-29 ENCOUNTER — Ambulatory Visit
Admission: RE | Admit: 2012-09-29 | Discharge: 2012-09-29 | Disposition: A | Discharge status: Home / Self Care | Payer: Worker's Compensation | Source: Ambulatory Visit | Attending: Occupational Medicine | Admitting: Occupational Medicine

## 2012-09-29 DIAGNOSIS — M25561 Pain in right knee: Secondary | ICD-10-CM

## 2012-10-05 ENCOUNTER — Institutional Professional Consult (permissible substitution): Payer: 59 | Admitting: Pulmonary Disease

## 2012-10-20 ENCOUNTER — Ambulatory Visit (INDEPENDENT_AMBULATORY_CARE_PROVIDER_SITE_OTHER): Payer: 59 | Admitting: Internal Medicine

## 2012-10-20 ENCOUNTER — Encounter: Payer: Self-pay | Admitting: Internal Medicine

## 2012-10-20 ENCOUNTER — Ambulatory Visit (INDEPENDENT_AMBULATORY_CARE_PROVIDER_SITE_OTHER)
Admission: RE | Admit: 2012-10-20 | Discharge: 2012-10-20 | Disposition: A | Discharge status: Home / Self Care | Payer: 59 | Source: Ambulatory Visit | Attending: Internal Medicine | Admitting: Internal Medicine

## 2012-10-20 VITALS — BP 118/78 | HR 86 | Temp 98.4°F | Ht 74.0 in | Wt 250.6 lb

## 2012-10-20 DIAGNOSIS — R05 Cough: Secondary | ICD-10-CM

## 2012-10-20 DIAGNOSIS — R059 Cough, unspecified: Secondary | ICD-10-CM

## 2012-10-20 DIAGNOSIS — J209 Acute bronchitis, unspecified: Secondary | ICD-10-CM

## 2012-10-20 DIAGNOSIS — I517 Cardiomegaly: Secondary | ICD-10-CM

## 2012-10-20 DIAGNOSIS — I5022 Chronic systolic (congestive) heart failure: Secondary | ICD-10-CM | POA: Insufficient documentation

## 2012-10-20 DIAGNOSIS — R053 Chronic cough: Secondary | ICD-10-CM

## 2012-10-20 MED ORDER — BENZONATATE 100 MG PO CAPS: ORAL_CAPSULE | ORAL | Status: DC

## 2012-10-20 MED ORDER — FAMOTIDINE 20 MG PO TABS: ORAL_TABLET | ORAL | Status: DC

## 2012-10-20 MED ORDER — OMEPRAZOLE 20 MG PO CPDR: DELAYED_RELEASE_CAPSULE | ORAL | Status: DC

## 2012-10-20 MED ORDER — PREDNISONE (PAK) 10 MG PO TABS: ORAL_TABLET | ORAL | Status: DC

## 2012-10-20 NOTE — Patient Instructions (Addendum)
Prednisone 10 mg take  4 each am x 2 days,   2 each am x 2 days, 1 each am x 2 days and stop   Try prilosec (omeprazole)  20mg   Take 30-60 min before first meal of the day and Pepcid 20 mg one bedtime until cough is completely gone for at least a week without the need for cough suppression  If cough bad, ok to use the tessilon to control it in short run   GERD (REFLUX)  is an extremely common cause of respiratory symptoms, many times with no significant heartburn at all.    It can be treated with medication, but also with lifestyle changes including avoidance of late meals, excessive alcohol, smoking cessation, and avoid fatty foods, chocolate, peppermint, colas, red wine, and acidic juices such as orange juice.  NO MINT OR MENTHOL PRODUCTS SO NO COUGH DROPS  USE SUGARLESS CANDY INSTEAD (jolley ranchers or Stover's)  NO OIL BASED VITAMINS - use powdered substitutes.  Please schedule a follow up office visit in 4 weeks, sooner if needed   late add:  Cm on cxr needs echo

## 2012-10-20 NOTE — Progress Notes (Signed)
Subjective:    Patient ID: Nathan Baird, male    DOB: [birth year]  MRN: [id number]  HPI  89 yobm never smoker very athletic with new onset recurrent cough since 2013    10/20/2012 1st pulmonary eval cc recurrent severe cough x 1.5 years much worse x 3 months   Seen 09/09/12  .Three month follow-up for cough. Horrible cough at night. Cough is outrageous; must drink a large amount of juice to help with cough. Coughing a lot throughout the day. Hears congestion with cough or breath. Feels wheezing in upper airways. Onset 1.5 years ago. Has had previous visit. Something is not right. S/p CXR stable. Mother with sarcoidosis; ACE level normal 05/2012. Mucous has changed colors in last week. Cough is terrible at night. No rhinorrhea; no nasal congestion; +PND chronic. Mucous is thicker in past week. No fever/chills/sweats; no malaise/fatigue. Substernal chest tightness and soreness. Last night, chest tightness was horrible. Intermittent SOB. Works around [employer] for past seven years; cough started 1.5 years ago. Symptoms are progressing. Prescribed Albuterol without improvement. The only thing that helped with cough is [employer]. No heartburn, indigestion, belching. No family history of asthma; no personal history of asthma. Playing basketball, no cough. While at work, still has cough but not nearly as bad as nighttime. Cough is worsening. No pets rec Zpak, tessilon pearls, >  Completely better x 3 weeks never took ppi worse again x sev weeks day > night worse as day goes on, worse after supper to point where lie down at it's worse at hs > clear mucus. No sinus or overt hb symptoms.   No obvious daytime variabilty or assoc sob cp or chest tightness, subjective wheeze overt sinus or hb symptoms. No unusual exp hx or h/o childhood pna/ asthma or premature birth to his knowledge.   no early am exacerbation  of respiratory  c/o's or need for noct saba. Also denies any obvious fluctuation of symptoms with  weather or environmental changes or other aggravating or alleviating factors except as outlined above      Review of Systems  Constitutional: Negative for fever and unexpected weight change.  HENT: Negative for ear pain, nosebleeds, congestion, sore throat, rhinorrhea, sneezing, trouble swallowing, dental problem, postnasal drip and sinus pressure.   Eyes: Negative for redness and itching.  Respiratory: Positive for cough. Negative for chest tightness, shortness of breath and wheezing.   Cardiovascular: Negative for palpitations and leg swelling.  Gastrointestinal: Negative for nausea and vomiting.  Genitourinary: Negative for dysuria.  Musculoskeletal: Negative for joint swelling.  Skin: Negative for rash.  Neurological: Negative for headaches.  Hematological: Does not bruise/bleed easily.  Psychiatric/Behavioral: Negative for dysphoric mood. The patient is not nervous/anxious.        Objective:   Physical Exam  amb bm nad Wt Readings from Last 3 Encounters:  10/20/12 250 lb 9.6 oz (113.671 kg)  09/09/12 248 lb 6.4 oz (112.674 kg)  05/28/12 245 lb (111.131 kg)     HEENT: nl dentition, turbinates, and orophanx. Nl external ear canals without cough reflex   NECK :  without JVD/Nodes/TM/ nl carotid upstrokes bilaterally   LUNGS: no acc muscle use, clear to A and P bilaterally without cough on insp or exp maneuvers   CV:  RRR  no s3 or murmur or increase in P2, no edema   ABD:  soft and nontender with nl excursion in the supine position. No bruits or organomegaly, bowel sounds nl  MS:  warm without deformities, calf  tenderness, cyanosis or clubbing  SKIN: warm and dry without lesions    NEURO:  alert, approp, no deficits   CXR  10/20/2012 :   Cardiomegaly with mild vascular congestion. No overt pulmonary edema or other acute cardiopulmonary abnormality.      Assessment & Plan:

## 2012-10-20 NOTE — Assessment & Plan Note (Signed)
The most common causes of chronic cough in immunocompetent adults include the following: upper airway cough syndrome (UACS), previously referred to as postnasal drip syndrome (PNDS), which is caused by variety of rhinosinus conditions; (2) asthma; (3) GERD; (4) chronic bronchitis from cigarette smoking or other inhaled environmental irritants; (5) nonasthmatic eosinophilic bronchitis; and (6) bronchiectasis.   These conditions, singly or in combination, have accounted for up to 94% of the causes of chronic cough in prospective studies.   Other conditions have constituted no >6% of the causes in prospective studies These have included bronchogenic carcinoma, chronic interstitial pneumonia, sarcoidosis, left ventricular failure, ACEI-induced cough, and aspiration from a condition associated with pharyngeal dysfunction.    Chronic cough is often simultaneously caused by more than one condition. A single cause has been found from 38 to 82% of the time, multiple causes from 18 to 62%. Multiply caused cough has been the result of three diseases up to 42% of the time.       Most likely this is  Classic Upper airway cough syndrome, so named because it's frequently impossible to sort out how much is  CR/sinusitis with freq throat clearing (which can be related to primary GERD)   vs  causing  secondary (" extra esophageal")  GERD from wide swings in gastric pressure that occur with throat clearing, often  promoting self use of mint and menthol lozenges that reduce the lower esophageal sphincter tone and exacerbate the problem further in a cyclical fashion.   These are the same pts (now being labeled as having "irritable larynx syndrome" by some cough centers) who not infrequently have a history of having failed to tolerate ace inhibitors,  dry powder inhalers or biphosphonates or report having atypical reflux symptoms that don't respond to standard doses of PPI , and are easily confused as having aecopd or asthma  flares by even experienced allergists/ pulmonologists.   Rec challenge with max gerd rx and short course prednisone then regroup  Discussed with pt: Unlike when you get a prescription for eyeglasses, it's not possible to always walk out of this or any medical office with a perfect prescription that is immediately effective  based on any test that we offer here.    On the contrary, it may take several weeks for the full impact of changes recommened today - hopefully you will respond well.  If not, then we'll adjust your medication on your next visit accordingly, knowing more then than we can possibly know now.

## 2012-10-21 ENCOUNTER — Telehealth: Payer: Self-pay | Admitting: Internal Medicine

## 2012-10-21 NOTE — Telephone Encounter (Signed)
Pt is aware of CXR results per MW.

## 2012-10-22 ENCOUNTER — Telehealth: Payer: Self-pay | Admitting: *Deleted

## 2012-10-22 NOTE — Telephone Encounter (Signed)
Message copied by Christen Butter on Thu Oct 22, 2012 10:31 AM ------      Message from: Nathan Baird      Created: Tue Oct 20, 2012  9:05 PM       Chart reviewed and never had echo but has chronic cardiac enlargement - this is probably just an "athletic heart" but could be contributing to some of his symptoms so I ordered an echo ------

## 2012-10-22 NOTE — Telephone Encounter (Signed)
LMTCB for pt 

## 2012-10-27 NOTE — Telephone Encounter (Signed)
Pt already aware ECHO is scheduled

## 2012-10-28 ENCOUNTER — Ambulatory Visit (HOSPITAL_COMMUNITY): Payer: 59 | Attending: Internal Medicine | Admitting: Radiology

## 2012-10-28 ENCOUNTER — Other Ambulatory Visit: Payer: Self-pay | Admitting: Cardiovascular Disease

## 2012-10-28 ENCOUNTER — Encounter: Payer: Self-pay | Admitting: Internal Medicine

## 2012-10-28 DIAGNOSIS — I5021 Acute systolic (congestive) heart failure: Secondary | ICD-10-CM

## 2012-10-28 DIAGNOSIS — R0989 Other specified symptoms and signs involving the circulatory and respiratory systems: Secondary | ICD-10-CM | POA: Insufficient documentation

## 2012-10-28 DIAGNOSIS — R0602 Shortness of breath: Secondary | ICD-10-CM

## 2012-10-28 DIAGNOSIS — I059 Rheumatic mitral valve disease, unspecified: Secondary | ICD-10-CM | POA: Insufficient documentation

## 2012-10-28 DIAGNOSIS — I517 Cardiomegaly: Secondary | ICD-10-CM

## 2012-10-28 DIAGNOSIS — I079 Rheumatic tricuspid valve disease, unspecified: Secondary | ICD-10-CM | POA: Insufficient documentation

## 2012-10-28 DIAGNOSIS — R0609 Other forms of dyspnea: Secondary | ICD-10-CM | POA: Insufficient documentation

## 2012-10-28 MED ORDER — POTASSIUM CHLORIDE CRYS ER 20 MEQ PO TBCR: 20.0000 meq | EXTENDED_RELEASE_TABLET | Freq: Every day | ORAL | Status: DC

## 2012-10-28 MED ORDER — FUROSEMIDE 40 MG PO TABS: 40.0000 mg | ORAL_TABLET | Freq: Every day | ORAL | Status: DC

## 2012-10-28 NOTE — Progress Notes (Signed)
Pt has acute systolic CHF by echo today.   I called in Lasix 40 daily, Kdur 20 daily.    Will see for consult on Wednesday 4/30 at 8 am. Alvino Chapel, will you open up 2-3 slots that day Please call and schedule apt.  He will need BMP, BNP, CBC, lipids.  Was told to go to ER or call our office if he worsens before  Apt. 4/30  [name], Montez Hageman., MD, [facility] 10/28/2012, 5:33 PM Office - [phone] Pager [phone]

## 2012-10-28 NOTE — Progress Notes (Signed)
Echocardiogram performed.  

## 2012-10-29 NOTE — Progress Notes (Signed)
Pt was called and high sodium foods were reviewed to avoid, CHF ss described, app given and I reviewed his new meds, he will pick it up today. Pt verbalized understanding to call with questions or concerns.

## 2012-11-04 ENCOUNTER — Encounter: Payer: Self-pay | Admitting: Cardiovascular Disease

## 2012-11-04 ENCOUNTER — Ambulatory Visit (INDEPENDENT_AMBULATORY_CARE_PROVIDER_SITE_OTHER): Payer: 59 | Admitting: Cardiovascular Disease

## 2012-11-04 VITALS — BP 112/68 | HR 73 | Wt 241.0 lb

## 2012-11-04 DIAGNOSIS — I509 Heart failure, unspecified: Secondary | ICD-10-CM

## 2012-11-04 MED ORDER — CARVEDILOL 3.125 MG PO TABS: 3.1250 mg | ORAL_TABLET | Freq: Two times a day (BID) | ORAL | Status: DC

## 2012-11-04 NOTE — Assessment & Plan Note (Signed)
Presents for further evaluation of chronic systolic congestive heart failure. Surprisingly, he he has relatively few symptoms. He does have a chronic cough. He was found have restrictive lung defect and has been seen by Dr. Sherene Sires in the pulmonary department.  His ejection fraction is 20-25%. Despite this, he is able to play several hours of basketball without symptoms.  We will start him on carvedilol 3.125 mg twice a day. We discussed the fact that this may cause him to be fatigued for a week or so but then he should feel better after about a week. We'll see him for followup visit on May 28 at 8 AM. He'll call us if he has any palpitations.  We will try to initiate low-dose ACE inhibitor at that time. We may retry Lasix 20 mg and later time. He did not tolerate Lasix 40 mg a day.  He already has improved his diet. He used to be a fairly high salt diet but now is eating relatively healthy diet. He's lost 10 pounds and is feeling well.  He may need to apply for FMLA. He works for the city of [place].

## 2012-11-04 NOTE — Patient Instructions (Addendum)
START COREG 3.125 MG TWICE A DAY   Your physician recommends that you schedule a follow-up appointment in: 12/02/12 AT 8:00 AM

## 2012-11-04 NOTE — Progress Notes (Signed)
Nathan Baird Date of Birth  [birth year]       [facility]    [place] [address], [address]  [address], [address] [place], [place]  [address]   [place], [place]  [address] [phone]     [phone]   Fax  [phone]    Fax [phone]  Problem List: 1. Congestive heart failure-ejection fraction of 20-25% by echo 2. Restrictive lung disease  History of Present Illness:  Nathan Baird is a 32 year old gentleman who is a new consultation for further evaluation of his cough. He had an echocardiogram and was found to have ejection fraction of between 20 and 25%. I started him on Lasix 40 mg a day as well as potassium chloride 20 mg a day. He developed severe orthostatic hypotension and felt very poorly after taking one dose Lasix and he stopped.  Surprisingly, he does not have much shortness of breath. His basketball on a regular basis. He is able to play for hours at a time and does not have any significant shortness of breath. He does have a cough. He's tried a Z-Pak which cleared up the greenish color of his sputum but he is still having this cough.  He denies any PND orthopnea. He denies any syncope or presyncope. He denies any chest pain.  He does not have any cardiac history.  Current Outpatient Prescriptions on File Prior to Visit  Medication Sig Dispense Refill  . benzonatate (TESSALON) 100 MG capsule 1-2 perles tid PRN cough  60 capsule  0  . famotidine (PEPCID) 20 MG tablet One at bedtime  30 tablet  11  . omeprazole (PRILOSEC) 20 MG capsule Take 30-60 min before first meal of the day  30 capsule  3  . predniSONE (STERAPRED UNI-PAK) 10 MG tablet Prednisone 10 mg take  4 each am x 2 days,   2 each am x 2 days,  1 each am x2days and stop  14 tablet  0  . furosemide (LASIX) 40 MG tablet Take 1 tablet (40 mg total) by mouth daily.  30 tablet  3  . potassium chloride SA (K-DUR,KLOR-CON) 20 MEQ tablet Take 1 tablet (20 mEq total) by mouth daily.  30  tablet  3   No current facility-administered medications on file prior to visit.    No Known Allergies  Past Medical History  Diagnosis Date  . Cough     Past Surgical History  Procedure Laterality Date  . No past surgeries      History  Smoking status  . Never Smoker   Smokeless tobacco  . Not on file    History  Alcohol Use No    Family History  Problem Relation Age of Onset  . Sarcoidosis Mother     Reviw of Systems:  Reviewed in the HPI.  All other systems are negative.  Physical Exam: Blood pressure 112/68, pulse 73, weight 241 lb (109.317 kg), SpO2 98.00%. General: Well developed, well nourished, in no acute distress.  Head: Normocephalic, atraumatic, sclera non-icteric, mucus membranes are moist,   Neck: Supple. Carotids are 2 + without bruits. No JVD   Lungs: Clear   Heart: Regular rate S1-S2. No S3 gallop.  Abdomen: Soft, non-tender, non-distended with normal bowel sounds.  Msk:  Strength and tone are normal   Extremities: No clubbing or cyanosis. No edema.  Distal pedal pulses are 2+ and equal    Neuro: CN II - XII intact.  Alert and oriented  X 3.   Psych:  Normal   ECG: 11/04/2012: Normal sinus rhythm at 66 beats a minute. He has T-wave inversions in lead V3 through V6.  Assessment / Plan:

## 2012-11-19 ENCOUNTER — Ambulatory Visit: Payer: Self-pay | Admitting: Internal Medicine

## 2012-12-02 ENCOUNTER — Encounter: Payer: Self-pay | Admitting: Cardiovascular Disease

## 2012-12-02 ENCOUNTER — Ambulatory Visit (INDEPENDENT_AMBULATORY_CARE_PROVIDER_SITE_OTHER): Payer: 59 | Admitting: Cardiovascular Disease

## 2012-12-02 VITALS — BP 100/78 | HR 72 | Ht 74.0 in | Wt 241.0 lb

## 2012-12-02 DIAGNOSIS — I5022 Chronic systolic (congestive) heart failure: Secondary | ICD-10-CM

## 2012-12-02 DIAGNOSIS — I509 Heart failure, unspecified: Secondary | ICD-10-CM

## 2012-12-02 MED ORDER — CARVEDILOL 6.25 MG PO TABS: 6.2500 mg | ORAL_TABLET | Freq: Two times a day (BID) | ORAL | Status: DC

## 2012-12-02 NOTE — Progress Notes (Signed)
Nathan Baird Date of Birth  11-13-1980       [facility]    [place] [address], [address]  [address] [place], [place]  [address]   [place], [place]  [address] [phone]     [phone]   Fax  [phone]    Fax [phone]  Problem List: 1. Congestive heart failure-ejection fraction of 20-25% by echo 2. Restrictive lung disease  History of Present Illness:  Nathan Baird is a 32 year old gentleman who is a new consultation for further evaluation of his cough. He had an echocardiogram and was found to have ejection fraction of between 20 and 25%. I started him on Lasix 40 mg a day as well as potassium chloride 20 mg a day. He developed severe orthostatic hypotension and felt very poorly after taking one dose Lasix and he stopped.  Surprisingly, he does not have much shortness of breath. His basketball on a regular basis. He is able to play for hours at a time and does not have any significant shortness of breath. He does have a cough. He's tried a Z-Pak which cleared up the greenish color of his sputum but he is still having this cough.  He denies any PND orthopnea. He denies any syncope or presyncope. He denies any chest pain.  He does not have any cardiac history.  Dec 02, 2012:  Nathan Baird is feeling a bit better. His cough has improved. He still has episodes of profound fatigue.  His breathing is better.  He has greatly reduced his salt intake.  He still plays basket ball on a regular basis.     Current Outpatient Prescriptions on File Prior to Visit  Medication Sig Dispense Refill  . carvedilol (COREG) 3.125 MG tablet Take 1 tablet (3.125 mg total) by mouth 2 (two) times daily.  60 tablet  5  . furosemide (LASIX) 40 MG tablet Take 1 tablet (40 mg total) by mouth daily.  30 tablet  3  . potassium chloride SA (K-DUR,KLOR-CON) 20 MEQ tablet Take 1 tablet (20 mEq total) by mouth daily.  30 tablet  3   No current facility-administered  medications on file prior to visit.    No Known Allergies  Past Medical History  Diagnosis Date  . Cough     Past Surgical History  Procedure Laterality Date  . No past surgeries      History  Smoking status  . Never Smoker   Smokeless tobacco  . Not on file    History  Alcohol Use No    Family History  Problem Relation Age of Onset  . Sarcoidosis Mother     Reviw of Systems:  Reviewed in the HPI.  All other systems are negative.  Physical Exam: Blood pressure 100/78, pulse 72, height 6\' 2"  (1.88 m), weight 241 lb (109.317 kg). General: Well developed, well nourished, in no acute distress.  Head: Normocephalic, atraumatic, sclera non-icteric, mucus membranes are moist,   Neck: Supple. Carotids are 2 + without bruits. No JVD   Lungs: Clear   Heart: Regular rate S1-S2. No S3 gallop.  Abdomen: Soft, non-tender, non-distended with normal bowel sounds.  Msk:  Strength and tone are normal   Extremities: No clubbing or cyanosis. No edema.  Distal pedal pulses are 2+ and equal    Neuro: CN II - XII intact.  Alert and oriented X 3.   Psych:  Normal   ECG: 11/04/2012: Normal sinus rhythm at 66 beats  a minute. He has T-wave inversions in lead V3 through V6.  Assessment / Plan:

## 2012-12-02 NOTE — Assessment & Plan Note (Signed)
Nathan Baird seems to be feeling very well. His blood pressure is on the low side and he had a difficult time starting the Lasix after our last visit. He seems to be tolerating low-dose carvedilol. He has been able to greatly decrease his sodium intake.  We will increase his carvedilol to 6.25 mg twice a day.  My hope is that we will be able to start him on low-dose ACE inhibitor at his next visit. I'll see him again in 3-4 weeks for followup visit. He'll call us if he has any additional problems. Will and has been getting an echocardiogram after we have been on maximal medical therapy for 3 months.

## 2012-12-02 NOTE — Patient Instructions (Signed)
Your physician has recommended you make the following change in your medication:   INCREASE COREG/ CARVEDILOL TO 6.25 MG TWICE A DAY 12 HOURS A DAY  Your physician recommends that you schedule a follow-up appointment in: 3-4 WEEKS

## 2012-12-04 ENCOUNTER — Emergency Department (HOSPITAL_COMMUNITY): Payer: 59

## 2012-12-04 ENCOUNTER — Encounter (HOSPITAL_COMMUNITY): Payer: Self-pay | Admitting: Emergency Medicine

## 2012-12-04 ENCOUNTER — Emergency Department (HOSPITAL_COMMUNITY)
Admission: EM | Admit: 2012-12-04 | Discharge: 2012-12-05 | Disposition: A | Discharge status: Home / Self Care | Payer: 59 | Attending: Emergency Medicine | Admitting: Emergency Medicine

## 2012-12-04 ENCOUNTER — Telehealth: Payer: Self-pay | Admitting: Physician Assistant

## 2012-12-04 DIAGNOSIS — R0602 Shortness of breath: Secondary | ICD-10-CM | POA: Insufficient documentation

## 2012-12-04 DIAGNOSIS — R61 Generalized hyperhidrosis: Secondary | ICD-10-CM | POA: Insufficient documentation

## 2012-12-04 DIAGNOSIS — M940 Chondrocostal junction syndrome [Tietze]: Secondary | ICD-10-CM | POA: Insufficient documentation

## 2012-12-04 DIAGNOSIS — Z8709 Personal history of other diseases of the respiratory system: Secondary | ICD-10-CM | POA: Insufficient documentation

## 2012-12-04 DIAGNOSIS — I509 Heart failure, unspecified: Secondary | ICD-10-CM | POA: Insufficient documentation

## 2012-12-04 HISTORY — DX: Heart failure, unspecified: I50.9

## 2012-12-04 LAB — BASIC METABOLIC PANEL
BUN: 15 mg/dL (ref 6–23)
CO2: 27 mEq/L (ref 19–32)
Calcium: 9.8 mg/dL (ref 8.4–10.5)
Chloride: 103 mEq/L (ref 96–112)
Creatinine, Ser: 1.39 mg/dL — ABNORMAL HIGH (ref 0.50–1.35)
GFR calc Af Amer: 77 mL/min — ABNORMAL LOW (ref >90)
GFR calc non Af Amer: 66 mL/min — ABNORMAL LOW (ref >90)
Glucose, Bld: 85 mg/dL (ref 70–99)
Potassium: 3.5 mEq/L (ref 3.5–5.1)
Sodium: 142 mEq/L (ref 135–145)

## 2012-12-04 LAB — CBC
HCT: 43.5 % (ref 39.0–52.0)
Hemoglobin: 15.5 g/dL (ref 13.0–17.0)
MCH: 30.6 pg (ref 26.0–34.0)
MCHC: 35.6 g/dL (ref 30.0–36.0)
MCV: 86 fL (ref 78.0–100.0)
Platelets: 298 10*3/uL (ref 150–400)
RBC: 5.06 MIL/uL (ref 4.22–5.81)
RDW: 12.5 % (ref 11.5–15.5)
WBC: 8.9 10*3/uL (ref 4.0–10.5)

## 2012-12-04 LAB — POCT I-STAT TROPONIN I: Troponin i, poc: 0 ng/mL (ref 0.00–0.08)

## 2012-12-04 MED ORDER — NAPROXEN 250 MG PO TABS
250.0000 mg | ORAL_TABLET | Freq: Once | ORAL | Status: AC
  Administered 2012-12-05: 250 mg via ORAL
  Filled 2012-12-04: qty 1

## 2012-12-04 NOTE — ED Notes (Signed)
Pt asked about wait time and states he usually goes to [facility].  Explained triage process to pt and informed him that we would start labs and chest XR so that MD would have results once he is seen.

## 2012-12-04 NOTE — ED Provider Notes (Signed)
History     CSN: [id number]  Arrival date & time 12/04/12  1929   First MD Initiated Contact with Patient 12/04/12 2256      Chief Complaint  Patient presents with  . Chest Pain    (Consider location/radiation/quality/duration/timing/severity/associated sxs/prior treatment) HPI 32 year old male presents to emergency room with complaint of central chest pain starting today around 2 PM.  Pain is nonradiating, no prior history of same.  Pain is worse with deep breathing and palpation.  Patient reports pain has been constant since onset.  Pain started after playing basketball.  He denies any trauma or strenuous activity.  Other than basketball today.  Patient has history of congestive heart failure, noted on echo to have EF of 20-25%.  Patient reports he has had some shortness of breath and diaphoresis with the pain, none currently though.  Patient is followed by [facility] our cardiology, recently increased his Coreg from 6.25 mg once a day to twice a day.  Patient was concerned about problems with his heart with this chest pain and presented to the ER.  Past Medical History  Diagnosis Date  . Cough   . CHF (congestive heart failure)     Past Surgical History  Procedure Laterality Date  . No past surgeries      Family History  Problem Relation Age of Onset  . Sarcoidosis Mother     History  Substance Use Topics  . Smoking status: Never Smoker   . Smokeless tobacco: Not on file  . Alcohol Use: No      Review of Systems  Allergies  Review of patient's allergies indicates no known allergies.  Home Medications   Current Outpatient Rx  Name  Route  Sig  Dispense  Refill  . carvedilol (COREG) 6.25 MG tablet   Oral   Take 1 tablet (6.25 mg total) by mouth 2 (two) times daily.   60 tablet   5     MED INCREASED     BP 125/85  Pulse 71  Temp(Src) 98.5 F (36.9 C) (Oral)  Resp 20  SpO2 97%  Physical Exam  Nursing note and vitals reviewed. Constitutional: He is  oriented to person, place, and time. He appears well-developed and well-nourished.  HENT:  Head: Normocephalic and atraumatic.  Right Ear: External ear normal.  Left Ear: External ear normal.  Nose: Nose normal.  Mouth/Throat: Oropharynx is clear and moist.  Eyes: Conjunctivae and EOM are normal. Pupils are equal, round, and reactive to light.  Neck: Normal range of motion. Neck supple. No JVD present. No tracheal deviation present. No thyromegaly present.  Cardiovascular: Normal rate, regular rhythm, normal heart sounds and intact distal pulses.  Exam reveals no gallop and no friction rub.   No murmur heard. Pulmonary/Chest: Effort normal and breath sounds normal. No stridor. No respiratory distress. He has no wheezes. He has no rales. He exhibits tenderness (patient with tenderness to his mid sternum and sternal costal margins).  Abdominal: Soft. Bowel sounds are normal. He exhibits no distension and no mass. There is no tenderness. There is no rebound and no guarding.  Musculoskeletal: Normal range of motion. He exhibits no edema and no tenderness.  Lymphadenopathy:    He has no cervical adenopathy.  Neurological: He is alert and oriented to person, place, and time. He exhibits normal muscle tone. Coordination normal.  Skin: Skin is warm and dry. No rash noted. No erythema. No pallor.  Psychiatric: He has a normal mood and affect. His behavior is  normal. Judgment and thought content normal.    See History of Present Illness; otherwise all other systems are reviewed and negative ED Course  Procedures (including critical care time)  Labs Reviewed  BASIC METABOLIC PANEL - Abnormal; Notable for the following:    Creatinine, Ser 1.39 (*)    GFR calc non Af Amer 66 (*)    GFR calc Af Amer 77 (*)    All other components within normal limits  PRO B NATRIURETIC PEPTIDE - Abnormal; Notable for the following:    Pro B Natriuretic peptide (BNP) 136.8 (*)    All other components within normal  limits  CBC  POCT I-STAT TROPONIN I   Dg Chest 2 View  12/04/2012   *RADIOLOGY REPORT*  Clinical Data: Chest pain, shortness of breath  CHEST - 2 VIEW  Comparison: 10/20/2012  Findings: Lungs are essentially clear.  No focal consolidation.  No pleural effusion or pneumothorax.  Cardiomegaly.  Mild degenerative changes of the visualized thoracolumbar spine.  IMPRESSION: No evidence of acute cardiopulmonary disease.   Original Report Authenticated By: Charline Bills, M.D.    Date: 12/04/2012  Rate: 79  Rhythm: normal sinus rhythm  QRS Axis: normal  Intervals: normal  ST/T Wave abnormalities: t wave inversions inferio-laterally  Conduction Disutrbances:none  Narrative Interpretation:   Old EKG Reviewed: unchanged    1. Costochondral chest pain       MDM  32 year old male with chest pain.  It appears to be musculoskeletal/costochondritis in nature.  Patient does have history of congestive heart failure with ejection fraction of 20-25%.  Will inform cardiology of his visit today, but feel that he is safe for discharge        Olivia Mackie, MD 12/05/12 [phone]

## 2012-12-04 NOTE — Telephone Encounter (Signed)
Nathan Baird is a 32 y/o M who was recently evaluated for newly diagnosed CHF with EF 20-25%. He called the answering service this evening complaining of severe intermittent chest pain starting around 2pm. He is beginning to feel SOB as well. He is actually in the [facility] parking lot and was going to go to [facility] first, but I urged him to go to the ER instead given his cardiac history and severity of symptoms. He verbalized understand and gratitude and will do that now. Elverda Wendel PA-C

## 2012-12-04 NOTE — ED Notes (Signed)
C/o tightness to center of chest with sob and diaphoresis since he finished playing basketball at 2pm. Reports history of CHF.

## 2012-12-05 MED ORDER — TRAMADOL HCL 50 MG PO TABS: 50.0000 mg | ORAL_TABLET | Freq: Four times a day (QID) | ORAL | Status: DC | PRN

## 2012-12-05 NOTE — ED Notes (Signed)
Pt c/o central, pounding CP since this afternoon.  States pain worsens with deep breath.  Denies diaphoresis, n/v

## 2012-12-05 NOTE — ED Notes (Signed)
Rx x 1.  Pt voiced understanding to f/u with cardiologist on Monday and return for worsening condition.

## 2012-12-16 ENCOUNTER — Emergency Department (HOSPITAL_BASED_OUTPATIENT_CLINIC_OR_DEPARTMENT_OTHER)
Admission: EM | Admit: 2012-12-16 | Discharge: 2012-12-16 | Disposition: A | Discharge status: Home / Self Care | Payer: 59 | Attending: Emergency Medicine | Admitting: Emergency Medicine

## 2012-12-16 ENCOUNTER — Encounter (HOSPITAL_BASED_OUTPATIENT_CLINIC_OR_DEPARTMENT_OTHER): Payer: Self-pay | Admitting: *Deleted

## 2012-12-16 DIAGNOSIS — Z79899 Other long term (current) drug therapy: Secondary | ICD-10-CM | POA: Insufficient documentation

## 2012-12-16 DIAGNOSIS — R209 Unspecified disturbances of skin sensation: Secondary | ICD-10-CM | POA: Insufficient documentation

## 2012-12-16 DIAGNOSIS — A749 Chlamydial infection, unspecified: Secondary | ICD-10-CM | POA: Insufficient documentation

## 2012-12-16 DIAGNOSIS — Z202 Contact with and (suspected) exposure to infections with a predominantly sexual mode of transmission: Secondary | ICD-10-CM

## 2012-12-16 DIAGNOSIS — I509 Heart failure, unspecified: Secondary | ICD-10-CM | POA: Insufficient documentation

## 2012-12-16 LAB — URINALYSIS, ROUTINE W REFLEX MICROSCOPIC
Glucose, UA: NEGATIVE mg/dL
Hgb urine dipstick: NEGATIVE
Leukocytes, UA: NEGATIVE
Protein, ur: NEGATIVE mg/dL
pH: 5.5 (ref 5.0–8.0)

## 2012-12-16 MED ORDER — AZITHROMYCIN 250 MG PO TABS
1000.0000 mg | ORAL_TABLET | Freq: Once | ORAL | Status: AC
  Administered 2012-12-16: 1000 mg via ORAL
  Filled 2012-12-16: qty 4

## 2012-12-16 MED ORDER — LIDOCAINE HCL (PF) 1 % IJ SOLN
INTRAMUSCULAR | Status: AC
  Administered 2012-12-16: 5 mL
  Filled 2012-12-16: qty 5

## 2012-12-16 MED ORDER — CEFTRIAXONE SODIUM 250 MG IJ SOLR
250.0000 mg | Freq: Once | INTRAMUSCULAR | Status: AC
  Administered 2012-12-16: 250 mg via INTRAMUSCULAR
  Filled 2012-12-16: qty 250

## 2012-12-16 MED ORDER — ONDANSETRON 4 MG PO TBDP
4.0000 mg | ORAL_TABLET | Freq: Once | ORAL | Status: AC
  Administered 2012-12-16: 4 mg via ORAL
  Filled 2012-12-16: qty 1

## 2012-12-16 MED ORDER — METRONIDAZOLE 500 MG PO TABS
2000.0000 mg | ORAL_TABLET | Freq: Once | ORAL | Status: AC
  Administered 2012-12-16: 2000 mg via ORAL
  Filled 2012-12-16: qty 4

## 2012-12-16 NOTE — ED Notes (Signed)
Pt reports exposure to STD , request std check

## 2012-12-16 NOTE — ED Provider Notes (Addendum)
History     CSN: [id number]  Arrival date & time 12/16/12  1748   First MD Initiated Contact with Patient 12/16/12 1905      Chief Complaint  Patient presents with  . Exposure to STD    (Consider location/radiation/quality/duration/timing/severity/associated sxs/prior treatment) Patient is a 32 y.o. male presenting with STD exposure. The history is provided by the patient.  Exposure to STD This is a new problem. The current episode started yesterday. The problem occurs constantly. The problem has not changed since onset.Associated symptoms comments: Mild tingling at the tip of penis and minimal discharge.  Girlfriend told him that she was treated for Trichomonas and Chlamydia. Exacerbated by: Urinating. Nothing relieves the symptoms. He has tried nothing for the symptoms. The treatment provided no relief.    Past Medical History  Diagnosis Date  . Cough   . CHF (congestive heart failure)     Past Surgical History  Procedure Laterality Date  . No past surgeries      Family History  Problem Relation Age of Onset  . Sarcoidosis Mother     History  Substance Use Topics  . Smoking status: Never Smoker   . Smokeless tobacco: Not on file  . Alcohol Use: No      Review of Systems  All other systems reviewed and are negative.    Allergies  Review of patient's allergies indicates no known allergies.  Home Medications   Current Outpatient Rx  Name  Route  Sig  Dispense  Refill  . carvedilol (COREG) 6.25 MG tablet   Oral   Take 1 tablet (6.25 mg total) by mouth 2 (two) times daily.   60 tablet   5     MED INCREASED   . traMADol (ULTRAM) 50 MG tablet   Oral   Take 1 tablet (50 mg total) by mouth every 6 (six) hours as needed for pain.   15 tablet   0     BP 116/76  Pulse 80  Temp(Src) 98.8 F (37.1 C) (Oral)  Resp 18  Ht 6\' 2"  (1.88 m)  Wt 245 lb (111.131 kg)  BMI 31.44 kg/m2  SpO2 99%  Physical Exam  Nursing note and vitals  reviewed. Constitutional: He is oriented to person, place, and time. He appears well-developed and well-nourished. No distress.  HENT:  Head: Normocephalic and atraumatic.  Eyes: EOM are normal. Pupils are equal, round, and reactive to light.  Cardiovascular: Normal rate.   Pulmonary/Chest: Effort normal.  Abdominal: Soft. He exhibits no distension. There is no tenderness. There is no rebound and no guarding.  Genitourinary: Testes normal and penis normal. No penile tenderness. No discharge found.  Lymphadenopathy:       Right: No inguinal adenopathy present.       Left: No inguinal adenopathy present.  Neurological: He is alert and oriented to person, place, and time.  Skin: Skin is warm and dry. No rash noted. No erythema.    ED Course  Procedures (including critical care time)  Labs Reviewed  URINALYSIS, ROUTINE W REFLEX MICROSCOPIC - Abnormal; Notable for the following:    Specific Gravity, Urine 1.035 (*)    Bilirubin Urine SMALL (*)    Ketones, ur 15 (*)    All other components within normal limits  GC/CHLAMYDIA PROBE AMP   No results found.   1. Exposure to STD       MDM   Patient here for STD exposure day. States that his girlfriend called him and told  him she was treated for Trichomonas and Chlamydia. He states within the last 2 days he's had tingling and mild discharge from the penis. Prior history of STD approximately one year ago.  UA negative for Trichomonas. Patient treated for GC and Chlamydia and Trichomonas with Rocephin, azithromycin and Flagyl.        Gwyneth Sprout, MD 12/16/12 9811  Gwyneth Sprout, MD 12/16/12 9147

## 2012-12-28 ENCOUNTER — Ambulatory Visit: Payer: Self-pay | Admitting: Cardiovascular Disease

## 2012-12-30 ENCOUNTER — Encounter: Payer: Self-pay | Admitting: Cardiovascular Disease

## 2013-01-23 ENCOUNTER — Emergency Department (HOSPITAL_COMMUNITY): Payer: 59

## 2013-01-23 ENCOUNTER — Inpatient Hospital Stay (HOSPITAL_COMMUNITY)
Admission: EM | Admit: 2013-01-23 | Discharge: 2013-01-27 | DRG: 065 | Disposition: A | Discharge status: Home / Self Care | Payer: 59 | Attending: Neurology | Admitting: Neurology

## 2013-01-23 ENCOUNTER — Encounter (HOSPITAL_COMMUNITY): Payer: Self-pay | Admitting: *Deleted

## 2013-01-23 DIAGNOSIS — G8190 Hemiplegia, unspecified affecting unspecified side: Secondary | ICD-10-CM | POA: Diagnosis present

## 2013-01-23 DIAGNOSIS — R05 Cough: Secondary | ICD-10-CM | POA: Diagnosis present

## 2013-01-23 DIAGNOSIS — K219 Gastro-esophageal reflux disease without esophagitis: Secondary | ICD-10-CM | POA: Diagnosis present

## 2013-01-23 DIAGNOSIS — Q2112 Patent foramen ovale: Secondary | ICD-10-CM

## 2013-01-23 DIAGNOSIS — R002 Palpitations: Secondary | ICD-10-CM | POA: Diagnosis present

## 2013-01-23 DIAGNOSIS — I509 Heart failure, unspecified: Secondary | ICD-10-CM | POA: Diagnosis present

## 2013-01-23 DIAGNOSIS — I5022 Chronic systolic (congestive) heart failure: Secondary | ICD-10-CM

## 2013-01-23 DIAGNOSIS — Q211 Atrial septal defect: Secondary | ICD-10-CM

## 2013-01-23 DIAGNOSIS — Z79899 Other long term (current) drug therapy: Secondary | ICD-10-CM

## 2013-01-23 DIAGNOSIS — Q2111 Secundum atrial septal defect: Secondary | ICD-10-CM

## 2013-01-23 DIAGNOSIS — I639 Cerebral infarction, unspecified: Secondary | ICD-10-CM

## 2013-01-23 DIAGNOSIS — R4789 Other speech disturbances: Secondary | ICD-10-CM | POA: Diagnosis present

## 2013-01-23 DIAGNOSIS — I6350 Cerebral infarction due to unspecified occlusion or stenosis of unspecified cerebral artery: Secondary | ICD-10-CM

## 2013-01-23 DIAGNOSIS — I6340 Cerebral infarction due to embolism of unspecified cerebral artery: Principal | ICD-10-CM | POA: Diagnosis present

## 2013-01-23 DIAGNOSIS — R059 Cough, unspecified: Secondary | ICD-10-CM | POA: Diagnosis present

## 2013-01-23 DIAGNOSIS — R1310 Dysphagia, unspecified: Secondary | ICD-10-CM | POA: Diagnosis present

## 2013-01-23 DIAGNOSIS — R29810 Facial weakness: Secondary | ICD-10-CM | POA: Diagnosis present

## 2013-01-23 HISTORY — DX: Patent foramen ovale: Q21.12

## 2013-01-23 HISTORY — DX: Personal history of other specified conditions: Z87.898

## 2013-01-23 HISTORY — DX: Atrial septal defect: Q21.1

## 2013-01-23 HISTORY — DX: Cerebral infarction, unspecified: I63.9

## 2013-01-23 LAB — COMPREHENSIVE METABOLIC PANEL
Albumin: 3.9 g/dL (ref 3.5–5.2)
BUN: 16 mg/dL (ref 6–23)
Creatinine, Ser: 1.39 mg/dL — ABNORMAL HIGH (ref 0.50–1.35)
GFR calc Af Amer: 77 mL/min — ABNORMAL LOW (ref >90)
Total Protein: 7 g/dL (ref 6.0–8.3)

## 2013-01-23 LAB — PROTIME-INR
INR: 1.08 (ref 0.00–1.49)
Prothrombin Time: 13.8 seconds (ref 11.6–15.2)

## 2013-01-23 LAB — CBC
HCT: 43.4 % (ref 39.0–52.0)
Hemoglobin: 14.4 g/dL (ref 13.0–17.0)
MCH: 28.8 pg (ref 26.0–34.0)
MCHC: 33.2 g/dL (ref 30.0–36.0)
MCV: 86.8 fL (ref 78.0–100.0)

## 2013-01-23 LAB — DIFFERENTIAL
Basophils Relative: 0 % (ref 0–1)
Eosinophils Absolute: 0.2 10*3/uL (ref 0.0–0.7)
Eosinophils Relative: 3 % (ref 0–5)
Monocytes Absolute: 0.4 10*3/uL (ref 0.1–1.0)
Monocytes Relative: 7 % (ref 3–12)

## 2013-01-23 LAB — GLUCOSE, CAPILLARY: Glucose-Capillary: 89 mg/dL (ref 70–99)

## 2013-01-23 LAB — TROPONIN I: Troponin I: <0.3 ng/mL (ref <0.30)

## 2013-01-23 LAB — POCT I-STAT, CHEM 8
BUN: 16 mg/dL (ref 6–23)
Chloride: 106 mEq/L (ref 96–112)
HCT: 46 % (ref 39.0–52.0)
Sodium: 140 mEq/L (ref 135–145)
TCO2: 23 mmol/L (ref 0–100)

## 2013-01-23 LAB — POCT I-STAT TROPONIN I: Troponin i, poc: 0.01 ng/mL (ref 0.00–0.08)

## 2013-01-23 NOTE — ED Notes (Signed)
MD at bedside. 

## 2013-01-23 NOTE — ED Provider Notes (Signed)
History    CSN: [id number] Arrival date & time 01/23/13  2227  First MD Initiated Contact with Patient 01/23/13 2310 (on return from CT)   Chief Complaint  Patient presents with  . Numbness    Facial   (Consider location/radiation/quality/duration/timing/severity/associated sxs/prior Treatment) HPI 32 year old male with a history of congestive heart failure. He is here with a 2 hour history of numbness affecting the left side of his face and his left forearm associated with weakness of the left forearm. The symptoms were mild to moderate at their worst an apartment that resolved. He is continuing to have numbness and paresthesias of the left side of his face. He denies headache. He is not confused. There is no shortness of breath, chest pain, nausea or vomiting. There no specific mitigating or exacerbating factors. He does not know why he has congestive heart failure. His nurse reports he is having difficulty swallowing and has been coughing.  Past Medical History  Diagnosis Date  . Cough   . CHF (congestive heart failure)    Past Surgical History  Procedure Laterality Date  . No past surgeries     Family History  Problem Relation Age of Onset  . Sarcoidosis Mother    History  Substance Use Topics  . Smoking status: Never Smoker   . Smokeless tobacco: Not on file  . Alcohol Use: No    Review of Systems  All other systems reviewed and are negative.    Allergies  Review of patient's allergies indicates no known allergies.  Home Medications   Current Outpatient Rx  Name  Route  Sig  Dispense  Refill  . carvedilol (COREG) 6.25 MG tablet   Oral   Take 1 tablet (6.25 mg total) by mouth 2 (two) times daily.   60 tablet   5     MED INCREASED    BP 125/93  Pulse 62  Temp(Src) 98.6 F (37 C) (Oral)  Resp 10  SpO2 97%  Physical Exam General: Well-developed, well-nourished male in no acute distress; appearance consistent with age of record HENT: normocephalic,  atraumatic Eyes: pupils equal round and reactive to light; extraocular muscles intact Neck: supple; no bruit Heart: regular rate and rhythm Lungs: clear to auscultation bilaterally with normal respirations but with rhonchi on deep inspiration Abdomen: soft; nondistended; nontender; bowel sounds present Extremities: No deformity; full range of motion; pulses normal Neurologic: Awake, alert and oriented; left pronator drift; strength 5 out of 5 in right upper extremity and bilateral lower extremities, strength 4/5 in left upper extremity with wrist drop and past-pointing; slight left facial droop; altered sensation left side of face Skin: Warm and dry Psychiatric: Normal mood and affect     ED Course  Procedures (including critical care time)   MDM   Nursing notes and vitals signs, including pulse oximetry, reviewed.  Summary of this visit's results, reviewed by myself:  Labs:  Results for orders placed during the hospital encounter of 01/23/13 (from the past 24 hour(s))  GLUCOSE, CAPILLARY     Status: None   Collection Time    01/23/13 10:30 PM      Result Value Range   Glucose-Capillary 89  70 - 99 mg/dL   Comment 1 Notify RN    PROTIME-INR     Status: None   Collection Time    01/23/13 10:34 PM      Result Value Range   Prothrombin Time 13.8  11.6 - 15.2 seconds   INR 1.08  0.00 -  1.49  APTT     Status: None   Collection Time    01/23/13 10:34 PM      Result Value Range   aPTT 30  24 - 37 seconds  CBC     Status: None   Collection Time    01/23/13 10:34 PM      Result Value Range   WBC 5.6  4.0 - 10.5 K/uL   RBC 5.00  4.22 - 5.81 MIL/uL   Hemoglobin 14.4  13.0 - 17.0 g/dL   HCT 40.9  81.1 - 91.4 %   MCV 86.8  78.0 - 100.0 fL   MCH 28.8  26.0 - 34.0 pg   MCHC 33.2  30.0 - 36.0 g/dL   RDW 78.2  95.6 - 21.3 %   Platelets 261  150 - 400 K/uL  DIFFERENTIAL     Status: Abnormal   Collection Time    01/23/13 10:34 PM      Result Value Range   Neutrophils Relative  % 35 (*) 43 - 77 %   Neutro Abs 1.9  1.7 - 7.7 K/uL   Lymphocytes Relative 55 (*) 12 - 46 %   Lymphs Abs 3.1  0.7 - 4.0 K/uL   Monocytes Relative 7  3 - 12 %   Monocytes Absolute 0.4  0.1 - 1.0 K/uL   Eosinophils Relative 3  0 - 5 %   Eosinophils Absolute 0.2  0.0 - 0.7 K/uL   Basophils Relative 0  0 - 1 %   Basophils Absolute 0.0  0.0 - 0.1 K/uL  COMPREHENSIVE METABOLIC PANEL     Status: Abnormal   Collection Time    01/23/13 10:34 PM      Result Value Range   Sodium 137  135 - 145 mEq/L   Potassium 4.0  3.5 - 5.1 mEq/L   Chloride 102  96 - 112 mEq/L   CO2 26  19 - 32 mEq/L   Glucose, Bld 94  70 - 99 mg/dL   BUN 16  6 - 23 mg/dL   Creatinine, Ser 0.86 (*) 0.50 - 1.35 mg/dL   Calcium 9.5  8.4 - 57.8 mg/dL   Total Protein 7.0  6.0 - 8.3 g/dL   Albumin 3.9  3.5 - 5.2 g/dL   AST 31  0 - 37 U/L   ALT 25  0 - 53 U/L   Alkaline Phosphatase 65  39 - 117 U/L   Total Bilirubin 0.4  0.3 - 1.2 mg/dL   GFR calc non Af Amer 66 (*) >90 mL/min   GFR calc Af Amer 77 (*) >90 mL/min  TROPONIN I     Status: None   Collection Time    01/23/13 10:34 PM      Result Value Range   Troponin I <0.30  <0.30 ng/mL  POCT I-STAT TROPONIN I     Status: None   Collection Time    01/23/13 10:42 PM      Result Value Range   Troponin i, poc 0.01  0.00 - 0.08 ng/mL   Comment 3           POCT I-STAT, CHEM 8     Status: Abnormal   Collection Time    01/23/13 10:44 PM      Result Value Range   Sodium 140  135 - 145 mEq/L   Potassium 3.9  3.5 - 5.1 mEq/L   Chloride 106  96 - 112 mEq/L   BUN 16  6 - 23 mg/dL   Creatinine, Ser 1.61 (*) 0.50 - 1.35 mg/dL   Glucose, Bld 95  70 - 99 mg/dL   Calcium, Ion 0.96  0.45 - 1.23 mmol/L   TCO2 23  0 - 100 mmol/L   Hemoglobin 15.6  13.0 - 17.0 g/dL   HCT 40.9  81.1 - 91.4 %   EKG Interpretation:  Date & Time: 01/23/2013 10:29 PM  Rate: 69  Rhythm: normal sinus rhythm  QRS Axis: normal  Intervals: normal  ST/T Wave abnormalities: Inverted T waves laterally   Conduction Disutrbances:none  Narrative Interpretation:   Old EKG Reviewed: unchanged     Imaging Studies: Ct Head Wo Contrast  01/23/2013   *RADIOLOGY REPORT*  Clinical Data: Left sided facial numbness and left arm numbness  CT HEAD WITHOUT CONTRAST  Technique:  Contiguous axial images were obtained from the base of the skull through the vertex without contrast.  Comparison: Head CT, 05/24/2009  Findings: There are no parenchymal masses or mass effect.  There are no areas of abnormal parenchymal attenuation.  There is no evidence of a recent transcortical infarct.  The ventricles are normal in size and configuration.  There are no extra-axial masses or abnormal fluid collections.  There is no intracranial hemorrhage.  The visualized sinuses and mastoid air cells are clear.  IMPRESSION: Normal unenhanced CT scan of the brain.   Original Report Authenticated By: Amie Portland, M.D.   11:30 PM Code Stroke called after dicussion with Dr. Thad Ranger of neurology. She will admit to the neuro ICU.        Hanley Seamen, MD 01/24/13 [phone]

## 2013-01-23 NOTE — ED Notes (Signed)
Rapid response RN at bedside.

## 2013-01-23 NOTE — ED Notes (Signed)
Per EMS: pt coming from home with c/o left side facial numbness, left arm weakness/tingling. Grips equal, no facial droop noted, A&Ox4, respirations equal and unlabored, skin warm and dry. Pt states symptoms started approximately a hour prior to arrival

## 2013-01-23 NOTE — ED Notes (Signed)
Patient transported to CT 

## 2013-01-23 NOTE — ED Notes (Signed)
Patient transported to X-ray 

## 2013-01-24 ENCOUNTER — Encounter (HOSPITAL_COMMUNITY): Payer: Self-pay | Admitting: *Deleted

## 2013-01-24 ENCOUNTER — Inpatient Hospital Stay (HOSPITAL_COMMUNITY): Payer: 59

## 2013-01-24 DIAGNOSIS — I517 Cardiomegaly: Secondary | ICD-10-CM

## 2013-01-24 LAB — LIPID PANEL
Cholesterol: 167 mg/dL (ref 0–200)
HDL: 59 mg/dL (ref >39)
Total CHOL/HDL Ratio: 2.8 RATIO
VLDL: 11 mg/dL (ref 0–40)

## 2013-01-24 LAB — HEMOGLOBIN A1C: Mean Plasma Glucose: 114 mg/dL (ref <117)

## 2013-01-24 MED ORDER — PANTOPRAZOLE SODIUM 40 MG IV SOLR
40.0000 mg | Freq: Every day | INTRAVENOUS | Status: DC
  Administered 2013-01-24 (×2): 40 mg via INTRAVENOUS
  Filled 2013-01-24 (×3): qty 40

## 2013-01-24 MED ORDER — ALTEPLASE (STROKE) FULL DOSE INFUSION
90.0000 mg | Freq: Once | INTRAVENOUS | Status: AC
  Administered 2013-01-24: 90 mg via INTRAVENOUS
  Filled 2013-01-24: qty 90

## 2013-01-24 MED ORDER — ACETAMINOPHEN 650 MG RE SUPP: 650.0000 mg | RECTAL | Status: DC | PRN

## 2013-01-24 MED ORDER — SODIUM CHLORIDE 0.9 % IV SOLN
250.0000 mL | Freq: Once | INTRAVENOUS | Status: AC
  Administered 2013-01-24: 250 mL via INTRAVENOUS

## 2013-01-24 MED ORDER — SODIUM CHLORIDE 0.9 % IV SOLN
INTRAVENOUS | Status: DC
  Administered 2013-01-24: 01:00:00 via INTRAVENOUS

## 2013-01-24 MED ORDER — ACETAMINOPHEN 325 MG PO TABS
650.0000 mg | ORAL_TABLET | ORAL | Status: DC | PRN
  Administered 2013-01-24: 650 mg via ORAL
  Filled 2013-01-24: qty 2

## 2013-01-24 MED ORDER — STROKE: EARLY STAGES OF RECOVERY BOOK
Freq: Once | Status: AC
  Administered 2013-01-24: 20:00:00
  Filled 2013-01-24: qty 1

## 2013-01-24 MED ORDER — ASPIRIN EC 325 MG PO TBEC
325.0000 mg | DELAYED_RELEASE_TABLET | Freq: Every day | ORAL | Status: DC
  Administered 2013-01-24 – 2013-01-27 (×4): 325 mg via ORAL
  Filled 2013-01-24 (×5): qty 1

## 2013-01-24 MED ORDER — SENNOSIDES-DOCUSATE SODIUM 8.6-50 MG PO TABS
1.0000 | ORAL_TABLET | Freq: Every evening | ORAL | Status: DC | PRN
  Filled 2013-01-24: qty 1

## 2013-01-24 NOTE — Evaluation (Signed)
Clinical/Bedside Swallow Evaluation Patient Details  Name: Nathan Baird MRN: [id number] Date of Birth: June 14, 1981  Today's Date: 01/24/2013 Time: 1000-1035 SLP Time Calculation (min): 35 min  Past Medical History:  Past Medical History  Diagnosis Date  . Cough   . CHF (congestive heart failure)    Past Surgical History:  Past Surgical History  Procedure Laterality Date  . No past surgeries     HPI:  32 year old male admitted 01/23/13 after sudden onset of "weird" sensation in left face and LUE.  Pt given tPA in ED. BSE/SLE ordered per stroke protocol.  CT negative.   Assessment / Plan / Recommendation Clinical Impression  Minimal left facial weakness.  Speech fully intelligible, however, pt and girlfriend indicate it has not returned to baseline.  No oral leakage or residue observed or reported.  Pt did report difficulty with posterior propulsion and swallow of cracker last night, but reported this had resolved. No overt s/s aspiration with any consistency presented.  Will recommend regular diet with thin liquids, and follow for diet tolerance.  SLE ordered for Monday 7/21/    Aspiration Risk  Mild    Diet Recommendation Regular;Thin liquid   Liquid Administration via: Cup;Straw Medication Administration: Whole meds with liquid Supervision: Patient able to self feed Compensations: Slow rate;Small sips/bites Postural Changes and/or Swallow Maneuvers: Seated upright 90 degrees    Other  Recommendations Oral Care Recommendations: Oral care BID Other Recommendations: Clarify dietary restrictions   Follow Up Recommendations  Inpatient Rehab    Frequency and Duration min 1 x/week  1 week   Pertinent Vitals/Pain No pain reported.    SLP Swallow Goals Patient will consume recommended diet without observed clinical signs of aspiration with: Independent assistance Swallow Study Goal #1 - Progress: Progressing toward goal   Swallow Study Prior Functional Status   Regular  diet, thin liquids    General Date of Onset: 01/23/13 HPI: 32 year old male admitted 01/23/13 after sudden onset of "weird" sensation in left face and LUE.  Pt given tPA in ED. BSE/SLE ordered per stroke protocol.  CT negative. Type of Study: Bedside swallow evaluation Previous Swallow Assessment: n/a Diet Prior to this Study: NPO Temperature Spikes Noted: No Respiratory Status: Room air History of Recent Intubation: No Behavior/Cognition: Alert;Cooperative;Pleasant mood Oral Cavity - Dentition: Adequate natural dentition Self-Feeding Abilities: Able to feed self Patient Positioning: Upright in bed Baseline Vocal Quality: Clear Volitional Cough: Strong Volitional Swallow: Able to elicit    Oral/Motor/Sensory Function Overall Oral Motor/Sensory Function: Impaired Labial ROM: Reduced left Labial Symmetry: Abnormal symmetry left Labial Strength: Within Functional Limits Labial Sensation: Within Functional Limits Lingual ROM: Within Functional Limits Lingual Symmetry: Within Functional Limits Lingual Strength: Within Functional Limits Lingual Sensation: Within Functional Limits Facial ROM: Reduced left Facial Symmetry: Left droop Facial Strength: Within Functional Limits Facial Sensation: Reduced Velum: Within Functional Limits Mandible: Within Functional Limits   Ice Chips Ice chips: Not tested   Thin Liquid Thin Liquid: Within functional limits Presentation: Self Fed;Straw    Nectar Thick Nectar Thick Liquid: Not tested   Honey Thick Honey Thick Liquid: Not tested   Puree Puree: Within functional limits Presentation: Spoon;Self Fed   Solid   Nathan Baird [facility], CCC-SLP [phone]    Solid: Within functional limits Presentation: Self Fed       Nathan Baird, Ruffin Pyo 01/24/2013,11:52 AM

## 2013-01-24 NOTE — Progress Notes (Signed)
  Echocardiogram 2D Echocardiogram has been performed.  Nathan Baird 01/24/2013, 4:40 PM

## 2013-01-24 NOTE — Progress Notes (Signed)
*  PRELIMINARY RESULTS* Vascular Ultrasound Carotid Duplex (Doppler) has been completed.   There is no obvious evidence of hemodynamically significant internal carotid artery stenosis >40%. Vertebral arteries are patent with antegrade flow.  01/24/2013 12:46 PM Gertie Fey, RVT, RDCS, RDMS

## 2013-01-24 NOTE — Progress Notes (Signed)
Stroke Team Rounding History "32 y.o. male who was at work today when he began to feel "weird" on the left side. Symptoms were mostly in his left face and arm. EMS was called and the patient was brought in for evaluation. While in the ED symptoms worsened with weakness noted on the left side including the lower extremity and left facial droop. Code stroke was called at that time.  Date last known well: Date: 01/23/2013  Time last known well: Time: 21:00  tPA Given: Yes 00:14"   Subjective: Doing little better than yesterday. Left arm improving. Some slurred speech, word finding diff.   Objective: Vital signs in last 24 hours: Filed Vitals:   01/24/13 0906 01/24/13 1000 01/24/13 1100 01/24/13 1152  BP:  110/70 121/79   Pulse:  60 62   Temp: 98 F (36.7 C)   97.3 F (36.3 C)  TempSrc: Oral   Oral  Resp:  15 15   Height:      Weight:      SpO2:  95% 97%      Intake/Output from previous day: 07/19 0701 - 07/20 0700 In: 129 [I.V.:129] Out: 200 [Urine:200]  GENERAL EXAM: Patient is in no distress  CARDIOVASCULAR: Regular rate and rhythm, no murmurs, no carotid bruits  NEUROLOGIC: MENTAL STATUS: awake, alert, language fluent, comprehension intact, naming intact CRANIAL NERVE: no papilledema on fundoscopic exam, pupils equal and reactive to light, visual fields full to confrontation, extraocular muscles intact, no nystagmus, DECR SENS AND STRENGTH IN LEFT FACE, uvula midline, shoulder shrug symmetric, tongue midline. MOTOR: normal bulk and tone, full strength in the RUE AND RLE. LUE 4, LLE 4. SENSORY: DECR IN LUE AND LLE COORDINATION: NO ATAXIA. SLOW MOVEMENTS IN LUE  Lab Results:  Recent Labs  01/23/13 2234 01/23/13 2244  WBC 5.6  --   HGB 14.4 15.6  HCT 43.4 46.0  PLT 261  --     BMET  Recent Labs  01/23/13 2234 01/23/13 2244  NA 137 140  K 4.0 3.9  CL 102 106  CO2 26  --   GLUCOSE 94 95  BUN 16 16  CREATININE 1.39* 1.50*  CALCIUM 9.5  --      LIPIDS:    Component Value Date/Time   CHOL 167 01/24/2013 0400   TRIG 55 01/24/2013 0400   HDL 59 01/24/2013 0400   CHOLHDL 2.8 01/24/2013 0400   VLDL 11 01/24/2013 0400   LDLCALC 97 01/24/2013 0400    HEMOGLOBIN A1C: Lab Results  Component Value Date   HGBA1C 5.6 01/24/2013     Studies/Results: Dg Chest 2 View  01/23/2013   *RADIOLOGY REPORT*  Clinical Data: Left arm weakness  CHEST - 2 VIEW  Comparison: 12/04/2012  Findings: Borderline cardiomegaly persists without evidence for edema. Lung volumes are low with crowding of the bronchovascular markings.  No focal pulmonary opacity.  No pleural effusion. Minimal inferior endplate compression at T11 level is stable.  IMPRESSION: Borderline cardiomegaly without focal acute finding.   Original Report Authenticated By: Christiana Pellant, M.D.   Ct Head Wo Contrast  01/23/2013   *RADIOLOGY REPORT*  Clinical Data: Left sided facial numbness and left arm numbness  CT HEAD WITHOUT CONTRAST  Technique:  Contiguous axial images were obtained from the base of the skull through the vertex without contrast.  Comparison: Head CT, 05/24/2009  Findings: There are no parenchymal masses or mass effect.  There are no areas of abnormal parenchymal attenuation.  There is no evidence of a  recent transcortical infarct.  The ventricles are normal in size and configuration.  There are no extra-axial masses or abnormal fluid collections.  There is no intracranial hemorrhage.  The visualized sinuses and mastoid air cells are clear.  IMPRESSION: Normal unenhanced CT scan of the brain.   Original Report Authenticated By: Amie Portland, M.D.    MRI brain  MRA head   01/24/13 Carotid u/s - PRELIM There is no obvious evidence of hemodynamically significant internal carotid artery stenosis >40%. Vertebral arteries are patent with antegrade flow.  TTE   Medications:  .  stroke: mapping our early stages of recovery book   Does not apply Once  . pantoprazole (PROTONIX) IV   40 mg Intravenous QHS   . sodium chloride 10 mL/hr at 01/24/13 1100     Assessment: 32 y.o. male male with idiopathic CHF (dx'd April 2014), here with suspected right brain stroke, s/p IV TPA. Still with left face, arm > leg numbness and weakness.  Stroke Risk Factors - CHF   LOS: 1 day   Plan: - check MRI brain, MRA head - f/u TTE - Permissive HTN x 24-48 hours post stroke, then gradually reduce; goal SBP <180 - Antiplatelet/Anticoagulation therapy: pending 24 hour post TPA imaging, then start aspirin 325mg  daily - Head of bed < 30 degrees - Maintain euvolemia, euglycemia, euthermia - Telemetry - Frequent neuro checks - PT consult, OT consult - pending  Suanne Marker, MD 01/24/2013, 1:22 PM Certified in Neurology, Neurophysiology and Neuroimaging [facility] - Stroke Team  Please refer to [email or web address] for on-call Stroke MD

## 2013-01-24 NOTE — Code Documentation (Signed)
32 yo bm brought in via [facility] for Lt side weakness.  Per pt he was at work when he started feeling numb & weak on left & called EMS.  Code stroke called 2337, pt arrival 2227, LKW 2100, EDP exam 2320, stroke team 2345, neurologist 2352, pt arrival in CT 2300, phlebotomist arrival 2234, pharmacy notified to mix tPA 0000, tPA to bedside 0012, ICU bed requested 0004, tPA started 0014. NIH 6. Admit to 3107

## 2013-01-24 NOTE — Progress Notes (Signed)
SLP Note  Patient Details Name: PHU RECORD MRN: [id number] DOB: August 08, 1980   BSE completed. Recommend Regular diet with thin liquids.  Full report to follow. RN aware.  Ronith Berti B. Murvin [facility], CCC-SLP [phone]  Leigh Aurora 01/24/2013, 10:37 AM

## 2013-01-24 NOTE — ED Notes (Signed)
Dr. Reynolds at bedside.

## 2013-01-24 NOTE — H&P (Addendum)
Admission H&P    Chief Complaint: Left sided weakness and numbness HPI: Nathan Baird is an 32 y.o. male who was at work today when he began to feel "weird" on the left side.  Symptoms were mostly in his left face and arm.  EMS was called and the patient was brought in for evaluation.  While in the ED symptoms worsened with weakness noted on the left side including the lower extremity and left facial droop.  Code stroke was called at that time.     Date last known well: Date: 01/23/2013 Time last known well: Time: 21:00 tPA Given: Yes  Past Medical History  Diagnosis Date  . Cough   . CHF (congestive heart failure)     Past Surgical History  Procedure Laterality Date  . No past surgeries      Family History  Problem Relation Age of Onset  . Sarcoidosis Mother    Social History:  reports that he has never smoked. He does not have any smokeless tobacco history on file. He reports that he does not drink alcohol or use illicit drugs.  Allergies: No Known Allergies  Medications Prior to Admission  Medication Sig Dispense Refill  . carvedilol (COREG) 6.25 MG tablet Take 1 tablet (6.25 mg total) by mouth 2 (two) times daily.  60 tablet  5    ROS: History obtained from the patient  General ROS: negative for - chills, fatigue, fever, night sweats, weight gain or weight loss Psychological ROS: negative for - behavioral disorder, hallucinations, memory difficulties, mood swings or suicidal ideation Ophthalmic ROS: negative for - blurry vision, double vision, eye pain or loss of vision ENT ROS: negative for - epistaxis, nasal discharge, oral lesions, sore throat, tinnitus or vertigo Allergy and Immunology ROS: negative for - hives or itchy/watery eyes Hematological and Lymphatic ROS: negative for - bleeding problems, bruising or swollen lymph nodes Endocrine ROS: negative for - galactorrhea, hair pattern changes, polydipsia/polyuria or temperature intolerance Respiratory ROS: negative  for - cough, hemoptysis, shortness of breath or wheezing Cardiovascular ROS: negative for - chest pain, dyspnea on exertion, edema or irregular heartbeat Gastrointestinal ROS: negative for - abdominal pain, diarrhea, hematemesis, nausea/vomiting or stool incontinence Genito-Urinary ROS: negative for - dysuria, hematuria, incontinence or urinary frequency/urgency Musculoskeletal ROS: negative for - joint swelling or muscular weakness Neurological ROS: as noted in HPI Dermatological ROS: negative for rash and skin lesion changes  Physical Examination: Blood pressure 127/91, pulse 58, temperature 98.6 F (37 C), temperature source Oral, resp. rate 18, height 6\' 2"  (1.88 m), weight 115.667 kg (255 lb), SpO2 100.00%.  General Examination: HEENT-  Normocephalic, no lesions, without obvious abnormality.  Normal external eye and conjunctiva.  Normal TM's bilaterally.  Normal auditory canals and external ears. Normal external nose, mucus membranes and septum.  Normal pharynx. Neck supple with no masses, nodes, nodules or enlargement. Cardiovascular - S1, S2 normal Lungs - chest clear, no wheezing, rales, normal symmetric air entry Abdomen - soft, non-tender; bowel sounds normal; no masses,  no organomegaly Extremities - no edema  Neurologic Examination: Mental Status: Alert, oriented, thought content appropriate.  Speech fluent without evidence of aphasia.  Dysarthria noted.  Able to follow 3 step commands without difficulty. Cranial Nerves: II: Discs flat bilaterally; Visual fields grossly normal, pupils equal, round, reactive to light and accommodation III,IV, VI: ptosis not present, extra-ocular motions intact bilaterally V,VII: left facial droop, facial light touch sensation decreased on the left VIII: hearing normal bilaterally IX,X: gag reflex present  XI: bilateral shoulder shrug XII: midline tongue extension Motor: Right : Upper extremity   5/5    Left:     Upper extremity    4-/5  Lower extremity   5/5     Lower extremity   4/5 Tone and bulk:normal tone throughout; no atrophy noted Sensory: Pinprick and light touch decreased on the left upper extremity Deep Tendon Reflexes: 2+ with absent AJ's bilaterally Plantars: Right: downgoing   Left: downgoing Cerebellar: Finger-to-nose and heel-to-shin testing dysmetric on the left Gait: Unable to test CV: pulses palpable throughout   Laboratory Studies:   Basic Metabolic Panel:  Recent Labs Lab 01/23/13 2234 01/23/13 2244  NA 137 140  K 4.0 3.9  CL 102 106  CO2 26  --   GLUCOSE 94 95  BUN 16 16  CREATININE 1.39* 1.50*  CALCIUM 9.5  --     Liver Function Tests:  Recent Labs Lab 01/23/13 2234  AST 31  ALT 25  ALKPHOS 65  BILITOT 0.4  PROT 7.0  ALBUMIN 3.9   No results found for this basename: LIPASE, AMYLASE,  in the last 168 hours No results found for this basename: AMMONIA,  in the last 168 hours  CBC:  Recent Labs Lab 01/23/13 2234 01/23/13 2244  WBC 5.6  --   NEUTROABS 1.9  --   HGB 14.4 15.6  HCT 43.4 46.0  MCV 86.8  --   PLT 261  --     Cardiac Enzymes:  Recent Labs Lab 01/23/13 2234  TROPONINI <0.30    BNP: No components found with this basename: POCBNP,   CBG:  Recent Labs Lab 01/23/13 2230  GLUCAP 89    Microbiology: Results for orders placed during the hospital encounter of 12/16/12  GC/CHLAMYDIA PROBE AMP     Status: None   Collection Time    12/16/12  7:22 PM      Result Value Range Status   CT Probe RNA NEGATIVE  NEGATIVE Final   GC Probe RNA NEGATIVE  NEGATIVE Final   Comment: (NOTE)                                                                                              Normal Reference Range: Negative          Assay performed using the Gen-Probe APTIMA COMBO2 (R) Assay.     Acceptable specimen types for this assay include APTIMA Swabs (Unisex,     endocervical, urethral, or vaginal), first void urine, and ThinPrep     liquid based  cytology samples.    Coagulation Studies:  Recent Labs  01/23/13 2234  LABPROT 13.8  INR 1.08    Urinalysis: No results found for this basename: COLORURINE, APPERANCEUR, LABSPEC, PHURINE, GLUCOSEU, HGBUR, BILIRUBINUR, KETONESUR, PROTEINUR, UROBILINOGEN, NITRITE, LEUKOCYTESUR,  in the last 168 hours  Lipid Panel:  No results found for this basename: chol, trig, hdl, cholhdl, vldl, ldlcalc    HgbA1C:  No results found for this basename: HGBA1C    Urine Drug Screen:   No results found for this basename: labopia, cocainscrnur, labbenz, amphetmu, thcu, labbarb  Alcohol Level: No results found for this basename: ETH,  in the last 168 hours   Imaging: Dg Chest 2 View  01/23/2013   *RADIOLOGY REPORT*  Clinical Data: Left arm weakness  CHEST - 2 VIEW  Comparison: 12/04/2012  Findings: Borderline cardiomegaly persists without evidence for edema. Lung volumes are low with crowding of the bronchovascular markings.  No focal pulmonary opacity.  No pleural effusion. Minimal inferior endplate compression at T11 level is stable.  IMPRESSION: Borderline cardiomegaly without focal acute finding.   Original Report Authenticated By: Christiana Pellant, M.D.   Ct Head Wo Contrast  01/23/2013   *RADIOLOGY REPORT*  Clinical Data: Left sided facial numbness and left arm numbness  CT HEAD WITHOUT CONTRAST  Technique:  Contiguous axial images were obtained from the base of the skull through the vertex without contrast.  Comparison: Head CT, 05/24/2009  Findings: There are no parenchymal masses or mass effect.  There are no areas of abnormal parenchymal attenuation.  There is no evidence of a recent transcortical infarct.  The ventricles are normal in size and configuration.  There are no extra-axial masses or abnormal fluid collections.  There is no intracranial hemorrhage.  The visualized sinuses and mastoid air cells are clear.  IMPRESSION: Normal unenhanced CT scan of the brain.   Original Report  Authenticated By: Amie Portland, M.D.    Assessment: 32 y.o. male presenting with a left hemiparesis.  Symptoms have worsened while in the ED.  Checklist for tPA reviewed.  Risks and benefits of tPA discussed with patient and family.  Verbal consent obtained.  tPA administered.    Stroke Risk Factors - low EF  Plan: 1. HgbA1c, fasting lipid panel 2. MRI, MRA  of the brain without contrast 3. PT consult, OT consult, Speech consult 4. Echocardiogram 5. Carotid dopplers 6. Prophylactic therapy-None 7. Risk factor modification 8. Telemetry monitoring 9. Frequent neuro checks 10. Admit to 3100 11. Repeat head CT in 24 hours  This patient is critically ill and at significant risk of neurological worsening, death and care requires constant monitoring of vital signs, hemodynamics,respiratory and cardiac monitoring, neurological assessment, discussion with family, other specialists and medical decision making of high complexity. I spent 60 minutes of neurocritical care time  in the care of  this patient.  Thana Farr, MD [facility] [phone] 01/24/2013, 1:04 AM

## 2013-01-25 ENCOUNTER — Encounter (HOSPITAL_COMMUNITY): Payer: Self-pay | Admitting: Physician Assistant

## 2013-01-25 DIAGNOSIS — I639 Cerebral infarction, unspecified: Secondary | ICD-10-CM | POA: Diagnosis present

## 2013-01-25 DIAGNOSIS — I6789 Other cerebrovascular disease: Secondary | ICD-10-CM

## 2013-01-25 DIAGNOSIS — R059 Cough, unspecified: Secondary | ICD-10-CM

## 2013-01-25 DIAGNOSIS — R05 Cough: Secondary | ICD-10-CM

## 2013-01-25 MED ORDER — CARVEDILOL 6.25 MG PO TABS
6.2500 mg | ORAL_TABLET | Freq: Two times a day (BID) | ORAL | Status: DC
  Administered 2013-01-25 – 2013-01-27 (×3): 6.25 mg via ORAL
  Filled 2013-01-25 (×6): qty 1

## 2013-01-25 MED ORDER — PANTOPRAZOLE SODIUM 40 MG PO TBEC
40.0000 mg | DELAYED_RELEASE_TABLET | Freq: Every day | ORAL | Status: DC
  Administered 2013-01-25: 40 mg via ORAL
  Filled 2013-01-25: qty 1

## 2013-01-25 MED ORDER — SODIUM CHLORIDE 0.9 % IV SOLN
INTRAVENOUS | Status: DC
  Administered 2013-01-25: 23:00:00 via INTRAVENOUS

## 2013-01-25 NOTE — Progress Notes (Signed)
Stroke Team Progress Note  HISTORY 32 y.o. male who was at work today 01/24/2013 when he began to feel "weird" on the left side. Symptoms were mostly in his left face and arm. EMS was called and the patient was brought in for evaluation. While in the ED symptoms worsened with weakness noted on the left side including the lower extremity and left facial droop. Code stroke was called at that time. Post tpa administration, He was admitted to the neuro ICU for further evaluation and treatment.  SUBJECTIVE No family is at the bedside. His girlfriend arrived during rounds  Overall he feels his condition is gradually improving.   OBJECTIVE Most recent Vital Signs: Filed Vitals:   01/25/13 0500 01/25/13 0600 01/25/13 0700 01/25/13 0800  BP: 100/75 108/75 102/67 104/74  Pulse: 70 54 65 66  Temp:      TempSrc:      Resp: 16 16 15 14   Height:      Weight:      SpO2: 95% 97% 95% 96%   CBG (last 3)   Recent Labs  01/23/13 2230  GLUCAP 89    IV Fluid Intake:   . sodium chloride Stopped (01/24/13 1700)    MEDICATIONS  . aspirin EC  325 mg Oral Daily  . pantoprazole  40 mg Oral Daily   PRN:  acetaminophen, acetaminophen, senna-docusate  Diet:  Cardiac thin liquids Activity:  Bedrest DVT Prophylaxis:  SCDs   CLINICALLY SIGNIFICANT STUDIES Basic Metabolic Panel:  Recent Labs Lab 01/23/13 2234 01/23/13 2244  NA 137 140  K 4.0 3.9  CL 102 106  CO2 26  --   GLUCOSE 94 95  BUN 16 16  CREATININE 1.39* 1.50*  CALCIUM 9.5  --    Liver Function Tests:  Recent Labs Lab 01/23/13 2234  AST 31  ALT 25  ALKPHOS 65  BILITOT 0.4  PROT 7.0  ALBUMIN 3.9   CBC:  Recent Labs Lab 01/23/13 2234 01/23/13 2244  WBC 5.6  --   NEUTROABS 1.9  --   HGB 14.4 15.6  HCT 43.4 46.0  MCV 86.8  --   PLT 261  --    Coagulation:  Recent Labs Lab 01/23/13 2234  LABPROT 13.8  INR 1.08   Cardiac Enzymes:  Recent Labs Lab 01/23/13 2234  TROPONINI <0.30   Urinalysis: No results found  for this basename: COLORURINE, APPERANCEUR, LABSPEC, PHURINE, GLUCOSEU, HGBUR, BILIRUBINUR, KETONESUR, PROTEINUR, UROBILINOGEN, NITRITE, LEUKOCYTESUR,  in the last 168 hours Lipid Panel    Component Value Date/Time   CHOL 167 01/24/2013 0400   TRIG 55 01/24/2013 0400   HDL 59 01/24/2013 0400   CHOLHDL 2.8 01/24/2013 0400   VLDL 11 01/24/2013 0400   LDLCALC 97 01/24/2013 0400   HgbA1C  Lab Results  Component Value Date   HGBA1C 5.6 01/24/2013    Urine Drug Screen:   No results found for this basename: labopia, cocainscrnur, labbenz, amphetmu, thcu, labbarb    Alcohol Level: No results found for this basename: ETH,  in the last 168 hours   CT of the brain  01/23/2013    Normal unenhanced CT scan of the brain.     MRI of the brain  01/24/2013   1.  Small acute right MCA infarct effecting pre motor cortex.  No mass effect or hemorrhage. 2.  Otherwise normal noncontrast brain MRI.  MRA of the brain  01/24/2013   Negative intracranial MRA.   No right MCA branch irregularity, stenosis, or occlusion  identified.   2D Echocardiogram  EF 20-25% with no source of embolus. Systolic function severely reduced.  Carotid Doppler  No evidence of hemodynamically significant internal carotid artery stenosis. Vertebral artery flow is antegrade.   CXR  01/23/2013   Borderline cardiomegaly without focal acute finding.     EKG  normal EKG, normal sinus rhythm, nonspecific  T wave changes.   Therapy Recommendations   GENERAL EXAM:  Patient is in no distress  CARDIOVASCULAR:  Regular rate and rhythm, no murmurs, no carotid bruits  NEUROLOGIC:  MENTAL STATUS: awake, alert, language fluent, comprehension intact, naming intact  CRANIAL NERVE: no papilledema on fundoscopic exam, pupils equal and reactive to light, visual fields full to confrontation, extraocular muscles intact, no nystagmus, DECR SENS AND STRENGTH IN LEFT FACE, uvula midline, shoulder shrug symmetric, tongue midline.  MOTOR: normal bulk and  tone, full strength in the RUE AND RLE. LUE 4, LLE 4.  SENSORY: DECR IN LUE AND LLE  COORDINATION: NO ATAXIA. SLOW MOVEMENTS IN LUE   ASSESSMENT [name] is a 32 y.o. male presenting with left hemiparesis. Status post IV t-PA 01/24/2013 at 0014. Imaging confirms a small right MCA infarct in the pre motor cortex. Infarct felt to be embolic secondary to likely cardiac source, workup underway.  On no antithrombotic prior to admission. Now on aspirin 325 mg orally every day for secondary stroke prevention. Work up underway.   Idiopathic CHF Dx'd April 2014  Hospital day # 2  TREATMENT/PLAN  Continue aspirin 325 mg orally every day for secondary stroke prevention.  OOB, therapy evals  Resume home medications.  Cardiology consult for treatment - ? TEE to look for cardiac source vs anticoagulant given current cardiac status  Transfer to the floor  Annie Main, MSN, RN, ANVP-BC, ANP-BC, GNP-BC [facility] Pager: [phone] 01/25/2013 8:23 AM  I have personally obtained a history, examined the patient, evaluated imaging results, and formulated the assessment and plan of care. I agree with the above. Delia Heady, MD

## 2013-01-25 NOTE — Evaluation (Signed)
Occupational Therapy Evaluation Patient Details Name: Nathan Baird MRN: [id number] DOB: 24-Feb-1981 Today's Date: 01/25/2013 Time: 6962-9528 OT Time Calculation (min): 23 min  OT Assessment / Plan / Recommendation History of present illness 32 year old admitted with L side weakness due to small R MCA infarct affecting premotor cortex.  s/p Tpa.    Clinical Impression   Pt presents with mild weakness and impaired endurance on the L side.  Pt with dizziness due to vestibular dysfunction per PT placing him at risk for falls during ADL, particular with dynamic standing and showering in standing. Will follow acutely.    OT Assessment  Patient needs continued OT Services    Follow Up Recommendations  Outpatient OT    Barriers to Discharge      Equipment Recommendations       Recommendations for Other Services    Frequency  Min 2X/week    Precautions / Restrictions Precautions Precautions: Fall   Pertinent Vitals/Pain No pain, vital monitored throughout WNL.    ADL  Eating/Feeding: Independent Where Assessed - Eating/Feeding: Chair Grooming: Wash/dry hands;Supervision/safety Where Assessed - Grooming: Unsupported standing Upper Body Bathing: Set up Where Assessed - Upper Body Bathing: Unsupported sitting Lower Body Bathing: Min guard Where Assessed - Lower Body Bathing: Unsupported standing;Unsupported sitting Upper Body Dressing: Set up Where Assessed - Upper Body Dressing: Unsupported sitting Lower Body Dressing: Min guard Where Assessed - Lower Body Dressing: Unsupported sitting;Supported sit to stand Toilet Transfer: Min Pension scheme manager Method: Sit to Barista: Regular height toilet Toileting - Clothing Manipulation and Hygiene: Modified independent Where Assessed - Glass blower/designer Manipulation and Hygiene: Sit on 3-in-1 or toilet Equipment Used: Gait belt Transfers/Ambulation Related to ADLs: Pt with fatigue in L LE with ambulation and  dizziness, see PT note for vestibular evaluation.    OT Diagnosis: Generalized weakness;Hemiplegia non-dominant side  OT Problem List: Decreased activity tolerance;Decreased strength;Impaired balance (sitting and/or standing);Decreased coordination;Decreased knowledge of use of DME or AE OT Treatment Interventions: Self-care/ADL training;Neuromuscular education;Patient/family education;Balance training   OT Goals(Current goals can be found in the care plan section) Acute Rehab OT Goals Patient Stated Goal: Return to active lifestyle. OT Goal Formulation: With patient Time For Goal Achievement: 02/01/13 Potential to Achieve Goals: Good ADL Goals Pt Will Perform Grooming: standing;Independently Pt Will Perform Lower Body Bathing: Independently;sit to/from stand Pt Will Perform Lower Body Dressing: Independently;sit to/from stand Pt Will Transfer to Toilet: Independently;ambulating Pt Will Perform Toileting - Clothing Manipulation and hygiene: Independently;sit to/from stand Pt Will Perform Tub/Shower Transfer: Tub transfer;ambulating;with supervision Pt/caregiver will Perform Home Exercise Program: For increased strengthening;Independently Additional ADL Goal #1: Pt will be able to stand with eyes closed supported by UE simulating showering x 30 secs with supervision.  Visit Information  Last OT Received On: 01/25/13 Assistance Needed: +1 PT/OT Co-Evaluation/Treatment: Yes History of Present Illness: 32 year old admitted with L side weakness due to small R MCA infarct affecting premotor cortex.  s/p Tpa.        Prior Functioning     Home Living Family/patient expects to be discharged to:: Private residence Living Arrangements: Spouse/significant other Available Help at Discharge: Family;Available 24 hours/day (girlfriend works but pt can stay with mom if necessary) Type of Home: Apartment Home Access: Level entry Home Layout: One level Home Equipment: None Prior Function Level  of Independence: Independent Communication Communication: No difficulties Dominant Hand: Right         Vision/Perception Vision - History Baseline Vision: No visual deficits Patient  Visual Report: No change from baseline   Cognition  Cognition Arousal/Alertness: Awake/alert Behavior During Therapy: WFL for tasks assessed/performed Overall Cognitive Status: Within Functional Limits for tasks assessed    Extremity/Trunk Assessment Upper Extremity Assessment Upper Extremity Assessment: LUE deficits/detail LUE Deficits / Details: mild weakness when compared to L, 4+/5 fatigues LUE Coordination: decreased fine motor;decreased gross motor (mild incoordination appears more related to fatigue) Lower Extremity Assessment Lower Extremity Assessment: Defer to PT evaluation     Mobility Bed Mobility Bed Mobility: Supine to Sit;Sitting - Scoot to Edge of Bed Supine to Sit: 7: Independent Sitting - Scoot to [place] of Bed: 7: Independent Transfers Transfers: Sit to Stand;Stand to Sit Sit to Stand: 5: Supervision Stand to Sit: 5: Supervision     Exercise     Balance Balance Balance Assessed: Yes Dynamic Sitting Balance Dynamic Sitting - Balance Support: Feet supported;During functional activity (when donning sock) Dynamic Sitting - Level of Assistance: 5: Stand by assistance   End of Session OT - End of Session Activity Tolerance: Patient limited by fatigue;Treatment limited secondary to medical complications (Comment) (dizziness) Patient left: in chair;with call bell/phone within reach;with family/visitor present Nurse Communication: Mobility status  GO     Evern Bio 01/25/2013, 11:51 AM [phone]

## 2013-01-25 NOTE — Consult Note (Signed)
CARDIOLOGY CONSULT NOTE  Nathan Baird ID: Nathan Baird, MRN: [id number], DOB/AGE: [birth year] 32 y.o. Admit date: 01/23/2013   Date of Consult: 01/25/2013 Primary Physician: No PCP Per Nathan Baird Primary Cardiologist: Nahser  Chief Complaint: feeling "weird" on left side Reason for Consult: ? TEE vs initiate anticoagulation in setting of new CVA  HPI: Nathan Baird is a 32 y/o M with minimal PMH except cough and LV dysfunction identified this year. In 09/2012 he was evaluated by urgent care for persistent cough and PFTs showed a restrictive defect. He was referred to pulmonary 10/2012 at which time he was placed on GERD rx and short-course prednisone with plan to re-group, but did not show. Note his mother has history of sarcoidosis. Cardiomegaly was seen on CXR thus 2D echo was performed showing EF 20-25% with diffuse HK and severely dilated LV. He was referred to Dr. Elease Hashimoto who placed him on Coreg. The Nathan Baird apparently did not tolerate Lasix due to orthostatic hypotension. He was still able to be very active, playing basketball for several hours. He has not had cath or nuc. He was due to f/u end of June but per records did not show.  He presented to [facility] late on the evening of 01/23/13 via EMS feeling "weird on his left side. Symptoms were mostly in his left face and arm initially, but in the ER his symptoms worsened with weakness noted on the left side including the lower extremity and left facial droop. Code stroke was called at that time and he received TPA. MRI brain showed small acute R MCA infarct affecting pre-motor cortex, no mass effect or hemorrhage. Carotids and MRA unremarkable. 2D echo again demonstrates EF 20-25%, severely dilated LV. We are asked to see the Nathan Baird to determine if TEE is warranted vs empiric initiation of full anticoagulation.  Nathan Baird reports for several years feeling "heart racing and pounding in chest" intermittently (on average once every 2 weeks). The episodes last  approximately 1 minute, does not cause him to stop whatever he is doing at the time, and is not associated with other symptoms such as chest pain, SOB, or dizziness. He continues to have persistent cough, usually productive with dark green, yellow, or clear sputum. Overall he feels his post-CVA condition is gradually improving. Still has left-sided facial tingling but otherwise doing well.   Mother has sarcoidosis and had PPM implanted at age 78; Nathan Baird does not know reason for PPM. Sister is 50 y/o and in good health. Unaware of other conditions in family. He has no PMH of bleeding disorders, HTN, hyperlipidemia, or bleeding disorders. Nathan Baird works in [employer], [employer] and does not wear a mask, although admits he should. Denies drug, tobacco, or alcohol use.   Past Medical History  Diagnosis Date  . Cough     a. PFTS 09/2012: restriction probable, further examinations recommended. Saw pulm 11/2012: placed on GERD rx and short course prednisone, planned to regroup.  . CHF (congestive heart failure)     echo 01/2013 LV severely dilated; EF 20-25%  . History of palpitations     2x/month for several years feels heart racing  . Cardiomegaly   . CVA (cerebral vascular accident) 01/23/2013    tPA administered; MRI brain showed small acute R MCA infarct      Most Recent Cardiac Studies: 2D Echo 01/24/13 - Left ventricle: The cavity size was severely dilated. Wall thickness was normal. Systolic function was severely reduced. The estimated ejection fraction was in the range of 20% to 25%. -  Atrial septum: No defect or patent foramen ovale was Identified.  2D echo 10/28/12 - Left ventricle: The cavity size was severely dilated. Wall thickness was normal. Systolic function was severely reduced. The estimated ejection fraction was in the range of 20% to 25%. Diffuse hypokinesis.   Surgical History:  Past Surgical History  Procedure Laterality Date  . No past surgeries       Home Meds: Prior to  Admission medications   Medication Sig Start Date End Date Taking? Authorizing Provider  carvedilol (COREG) 6.25 MG tablet Take 1 tablet (6.25 mg total) by mouth 2 (two) times daily. 12/02/12  Yes [name], MD    Inpatient Medications:  . aspirin EC  325 mg Oral Daily  . pantoprazole  40 mg Oral Daily      Allergies: No Known Allergies  History   Social History  . Marital Status: Single    Spouse Name: N/A    Number of Children: N/A  . Years of Education: N/A   Occupational History  . Not on file.   Social History Main Topics  . Smoking status: Never Smoker   . Smokeless tobacco: Not on file  . Alcohol Use: No  . Drug Use: No  . Sexually Active: Not on file   Other Topics Concern  . Not on file   Social History Narrative   Marital status: single     Children: 3 children (40, 1, 28month old)      Lives: with girlfriend.      Employment: works for city of [employer]      Tobacco: none      Alcohol: none      Drugs: none      Exercise: basketball every Friday night.     Family History  Problem Relation Age of Onset  . Sarcoidosis Mother   . Heart Problems Mother     PPM implantation age 98     Review of Systems: General: negative for chills, fever, night sweats or weight changes.  Cardiovascular: negative for chest pain, edema, orthopnea, palpitations, paroxysmal nocturnal dyspnea, shortness of breath or dyspnea on exertion Dermatological: negative for rash Respiratory: negative for cough or wheezing Urologic: negative for hematuria Abdominal: negative for nausea, vomiting, diarrhea, bright red blood per rectum, melena, or hematemesis Neurologic: negative for visual changes, syncope, or dizziness All other systems reviewed and are otherwise negative except as noted above.  Labs:  Recent Labs  01/23/13 2234  TROPONINI <0.30   Lab Results  Component Value Date   WBC 5.6 01/23/2013   HGB 15.6 01/23/2013   HCT 46.0 01/23/2013   MCV  86.8 01/23/2013   PLT 261 01/23/2013     Recent Labs Lab 01/23/13 2234 01/23/13 2244  NA 137 140  K 4.0 3.9  CL 102 106  CO2 26  --   BUN 16 16  CREATININE 1.39* 1.50*  CALCIUM 9.5  --   PROT 7.0  --   BILITOT 0.4  --   ALKPHOS 65  --   ALT 25  --   AST 31  --   GLUCOSE 94 95   Lab Results  Component Value Date   CHOL 167 01/24/2013   HDL 59 01/24/2013   LDLCALC 97 01/24/2013   TRIG 55 01/24/2013   Radiology/Studies:  Dg Chest 2 View  01/23/2013   *RADIOLOGY REPORT*  Clinical Data: Left arm weakness  CHEST - 2 VIEW  Comparison: 12/04/2012  Findings: Borderline cardiomegaly persists without  evidence for edema. Lung volumes are low with crowding of the bronchovascular markings.  No focal pulmonary opacity.  No pleural effusion. Minimal inferior endplate compression at T11 level is stable.  IMPRESSION: Borderline cardiomegaly without focal acute finding.   Original Report Authenticated By: Christiana Pellant, M.D.   Ct Head Wo Contrast  01/23/2013   *RADIOLOGY REPORT*  Clinical Data: Left sided facial numbness and left arm numbness  CT HEAD WITHOUT CONTRAST  Technique:  Contiguous axial images were obtained from the base of the skull through the vertex without contrast.  Comparison: Head CT, 05/24/2009  Findings: There are no parenchymal masses or mass effect.  There are no areas of abnormal parenchymal attenuation.  There is no evidence of a recent transcortical infarct.  The ventricles are normal in size and configuration.  There are no extra-axial masses or abnormal fluid collections.  There is no intracranial hemorrhage.  The visualized sinuses and mastoid air cells are clear.  IMPRESSION: Normal unenhanced CT scan of the brain.   Original Report Authenticated By: Amie Portland, M.D.   [name] Contrast  01/24/2013   *RADIOLOGY REPORT*  Clinical Data:  32 year old male 24 hours after TPA treatment for left side numbness and facial droop.  Comparison: Head CTs without contrast 01/23/2013  and earlier.  MRI HEAD WITHOUT CONTRAST  Technique: Multiplanar, multiecho pulse sequences of the brain and surrounding structures were obtained according to standard protocol without intravenous contrast.  Findings: A small mixed nodular and linear area of restricted diffusion occurs in the right posterior frontal lobe gyrus, 1 gyrus anterior to the motor strip, along the superior lateral convexity. The abnormal area encompasses 14 x 13 mm transaxially with decreased signal on ADC.  There is subtle associated T2 and FLAIR hyperintensity, mostly cortical (series 6 and series 7 images 17 and 18).  No associated mass effect or hemorrhage.  Elsewhere normal diffusion, great, and white matter signal throughout the brain. Major intracranial vascular flow voids are preserved.  No midline shift, ventriculomegaly, mass effect, evidence of mass lesion, extra-axial collection or acute intracranial hemorrhage. Cervicomedullary junction and pituitary are within normal limits. Negative visualized cervical spine.  Normal bone marrow signal.  Visualized orbit soft tissues are within normal limits.  Small right maxillary sinus mucous retention cyst.  Other paranasal sinuses and mastoids are clear.  Chronic left lamina papyracea fracture.  Negative scalp soft tissues.  IMPRESSION: 1.  Small acute right MCA infarct effecting pre motor cortex.  No mass effect or hemorrhage. 2.  Otherwise normal noncontrast brain MRI. 3.  MRA findings are below.  MRA HEAD WITHOUT CONTRAST  Technique: Angiographic images of the Circle of Willis were obtained using MRA technique without  intravenous contrast.  Findings: Antegrade flow in the posterior circulation with dominant distal right vertebral artery, the left functionally terminates in PICA.  Normal right PICA.  No basilar artery stenosis.  SCA and PCA origins are normal.  Diminutive posterior communicating arteries. Bilateral PCA branches are within normal limits.  Antegrade flow in both ICA  siphons.  No ICA stenosis.  Normal ophthalmic and posterior communicating artery origins.  Normal carotid termini, MCA and ACA origins.  Anterior communicating artery, visualized ACA branches, and left MCA branches are within normal limits.  Right MCA M1 segment is normal.  Right MCA trifurcation is patent. Visualized right MCA branches are within normal limits.  No focal branch stenosis or major branch occlusion is identified.  IMPRESSION: Negative intracranial MRA.   No right MCA branch irregularity,  stenosis, or occlusion identified.   Original Report Authenticated By: Erskine Speed, M.D.   Mr Mra Head/brain Wo Cm  01/24/2013   *RADIOLOGY REPORT*  Clinical Data:  32 year old male 24 hours after TPA treatment for left side numbness and facial droop.  Comparison: Head CTs without contrast 01/23/2013 and earlier.  MRI HEAD WITHOUT CONTRAST  Technique: Multiplanar, multiecho pulse sequences of the brain and surrounding structures were obtained according to standard protocol without intravenous contrast.  Findings: A small mixed nodular and linear area of restricted diffusion occurs in the right posterior frontal lobe gyrus, 1 gyrus anterior to the motor strip, along the superior lateral convexity. The abnormal area encompasses 14 x 13 mm transaxially with decreased signal on ADC.  There is subtle associated T2 and FLAIR hyperintensity, mostly cortical (series 6 and series 7 images 17 and 18).  No associated mass effect or hemorrhage.  Elsewhere normal diffusion, great, and white matter signal throughout the brain. Major intracranial vascular flow voids are preserved.  No midline shift, ventriculomegaly, mass effect, evidence of mass lesion, extra-axial collection or acute intracranial hemorrhage. Cervicomedullary junction and pituitary are within normal limits. Negative visualized cervical spine.  Normal bone marrow signal.  Visualized orbit soft tissues are within normal limits.  Small right maxillary sinus mucous  retention cyst.  Other paranasal sinuses and mastoids are clear.  Chronic left lamina papyracea fracture.  Negative scalp soft tissues.  IMPRESSION: 1.  Small acute right MCA infarct effecting pre motor cortex.  No mass effect or hemorrhage. 2.  Otherwise normal noncontrast brain MRI. 3.  MRA findings are below.  MRA HEAD WITHOUT CONTRAST  Technique: Angiographic images of the Circle of Willis were obtained using MRA technique without  intravenous contrast.  Findings: Antegrade flow in the posterior circulation with dominant distal right vertebral artery, the left functionally terminates in PICA.  Normal right PICA.  No basilar artery stenosis.  SCA and PCA origins are normal.  Diminutive posterior communicating arteries. Bilateral PCA branches are within normal limits.  Antegrade flow in both ICA siphons.  No ICA stenosis.  Normal ophthalmic and posterior communicating artery origins.  Normal carotid termini, MCA and ACA origins.  Anterior communicating artery, visualized ACA branches, and left MCA branches are within normal limits.  Right MCA M1 segment is normal.  Right MCA trifurcation is patent. Visualized right MCA branches are within normal limits.  No focal branch stenosis or major branch occlusion is identified.  IMPRESSION: Negative intracranial MRA.   No right MCA branch irregularity, stenosis, or occlusion identified.   Original Report Authenticated By: Erskine Speed, M.D.   EKG: NSR 69bpm, inf lat ST-T changes but similar territory to prior tracing Tele: NSR, sinus arrhythmia/sinus bradycardia, rare PVC  Physical Exam: Blood pressure 109/76, pulse 72, temperature 98.7 F (37.1 C), temperature source Oral, resp. rate 19, height 6\' 2"  (1.88 m), weight 255 lb (115.667 kg), SpO2 97.00%. General: Well developed, well nourished, healthy appearing black male in no acute distress. Head: Normocephalic, atraumatic, sclera non-icteric, no xanthomas, nares are without discharge.  Neck: Negative for carotid  bruits. JVD not elevated. Lungs: Clear bilaterally to auscultation without wheezes, rales, or rhonchi. Breathing is unlabored. Heart: RRR with S1 S2. No murmurs, rubs, or gallops appreciated. Abdomen: Soft, non-tender, non-distended with normoactive bowel sounds. No hepatomegaly. No rebound/guarding. No obvious abdominal masses. Msk:  Strength and tone appear normal for age. Extremities: No clubbing or cyanosis. No edema.  Distal pedal pulses are 2+ and equal bilaterally. Neuro: Alert  and oriented X 3. No facial asymmetry. No focal deficit. Moves all extremities spontaneously. Psych:  Responds to questions appropriately with a normal affect.   Assessment and Plan:   1. Acute CVA 2. Known LV dysfunction 3. Chronic cough with restrictive defect on PFTs 4. Renal insufficiency  Signed, Nyra Jabs, PA-S2 01/25/2013, 4:36 PM  Nathan Baird seen, examined. Available data reviewed. Agree with findings, assessment, and plan as outlined by Ms Lyanne Co. 33 year old gentleman with no past medical history who presented with an acute ischemic stroke. Has new dx of DCM with LVEF 20-25%. Exam reveals muscular, healthy appearing male in NAD. Lungs clear, heart RRR, no murmur, no leg edema. Issues as follows:   Etiology of cardiomyopathy: no HTN or alcohol history. R/O CAD with a gated coronary CTA. May be familial but no clear history  Etiology of stroke, suspect cardioembolic: check TEE tomorrow. Reviewed risks and indication with Nathan Baird. May need outpatient monitor if we do not anticoagulate him  Treatment of cardiomyopathy: on carvedilol. Wait on ACE until after CT / dye load.  Will hydrate overnight in anticipation of studies in the am.  Tonny Bollman, M.D. 01/25/2013 5:08 PM

## 2013-01-25 NOTE — Progress Notes (Signed)
UR completed 

## 2013-01-25 NOTE — Progress Notes (Signed)
Physical Therapy Evaluation Patient Details Name: Nathan Baird MRN: [id number] DOB: 09/15/1980 Today's Date: 01/25/2013 Time: 6962-9528 PT Time Calculation (min): 26 min  PT Assessment / Plan / Recommendation History of Present Illness  32 year old admitted with L side weakness due to small R MCA infarct affecting premotor cortex.  s/p Tpa.   Clinical Impression  Pt admitted with right CVA. Pt currently with functional limitations due to the deficits listed below (see PT Problem List). Pt appears to have right vestibular hypofunction as well as left LE coordination.  Initiated x1 exercises.  Pt reports that his left LE fatigues quickly.  Recommend Outpt PT.  Pt and girlfriend aware.  Pt will benefit from skilled PT to increase their independence and safety with mobility to allow discharge to the venue listed below.     PT Assessment  Patient needs continued PT services    Follow Up Recommendations  Outpatient PT;Supervision - Intermittent                Equipment Recommendations  None recommended by PT         Frequency Min 4X/week    Precautions / Restrictions Precautions Precautions: Fall Restrictions Weight Bearing Restrictions: No   Pertinent Vitals/Pain VSS, No pain      Mobility  Bed Mobility Bed Mobility: Supine to Sit;Sitting - Scoot to Edge of Bed Supine to Sit: 7: Independent Sitting - Scoot to [place] of Bed: 7: Independent Transfers Transfers: Sit to Stand;Stand to Sit Sit to Stand: 5: Supervision Stand to Sit: 5: Supervision Ambulation/Gait Ambulation/Gait Assistance: 4: Min assist Ambulation Distance (Feet): 300 Feet Assistive device: None Ambulation/Gait Assistance Details: Pt ambulated without device on unit.  Noted that pts' left LE with  incr effort to pick it up and swing it through.  No LOB because of it.  Pt reports that he felt "his left leg getting tired."  Tested vestibular system as we walked and pt did become dizzy with turns needing  steadying assist.  Pt sat and rested and walked back to room with continued difficulty secondary to unsteady.   Gait Pattern: Step-through pattern;Decreased stride length;Decreased hip/knee flexion - left;Decreased dorsiflexion - left Gait velocity: decreased Stairs: No Wheelchair Mobility Wheelchair Mobility: No Modified Rankin (Stroke Patients Only) Pre-Morbid Rankin Score: No symptoms Modified Rankin: Moderate disability         PT Diagnosis: Generalized weakness  PT Problem List: Decreased activity tolerance;Decreased balance;Decreased mobility;Decreased knowledge of use of DME;Decreased safety awareness;Decreased coordination PT Treatment Interventions: DME instruction;Gait training;Functional mobility training;Therapeutic activities;Therapeutic exercise;Balance training;Neuromuscular re-education;Cognitive remediation;Patient/family education     PT Goals(Current goals can be found in the care plan section) Acute Rehab PT Goals Patient Stated Goal: Return to active lifestyle. PT Goal Formulation: With patient Time For Goal Achievement: 02/01/13 Potential to Achieve Goals: Good  Visit Information  Last PT Received On: 01/25/13 Assistance Needed: +1 PT/OT Co-Evaluation/Treatment: Yes History of Present Illness: 32 year old admitted with L side weakness due to small R MCA infarct affecting premotor cortex.  s/p Tpa.        Prior Functioning  Home Living Family/patient expects to be discharged to:: Private residence Living Arrangements: Spouse/significant other Available Help at Discharge: Family;Available 24 hours/day (girlfriend works but pt can stay with mom if necessary) Type of Home: Apartment Home Access: Level entry Home Layout: One level Home Equipment: None Prior Function Level of Independence: Independent Communication Communication: No difficulties Dominant Hand: Right    Cognition  Cognition Arousal/Alertness: Awake/alert Behavior During  Therapy: WFL  for tasks assessed/performed Overall Cognitive Status: Within Functional Limits for tasks assessed    Extremity/Trunk Assessment Upper Extremity Assessment Upper Extremity Assessment: Defer to OT evaluation LUE Deficits / Details: mild weakness when compared to L, 4+/5 fatigues LUE Coordination: decreased fine motor;decreased gross motor (mild incoordination appears more related to fatigue) Lower Extremity Assessment Lower Extremity Assessment: LLE deficits/detail LLE Deficits / Details: grossly 4+/5 LLE Coordination: decreased fine motor;decreased gross motor Cervical / Trunk Assessment Cervical / Trunk Assessment: Normal   Balance Balance Balance Assessed: Yes Dynamic Sitting Balance Dynamic Sitting - Balance Support: Feet supported;During functional activity (when donning sock) Dynamic Sitting - Level of Assistance: 5: Stand by assistance Standardized Balance Assessment Standardized Balance Assessment: Dynamic Gait Index Dynamic Gait Index Level Surface: Normal Change in Gait Speed: Normal Gait with Horizontal Head Turns: Mild Impairment Gait with Vertical Head Turns: Mild Impairment Gait and Pivot Turn: Mild Impairment Step Over Obstacle: Mild Impairment Step Around Obstacles: Normal Steps: Mild Impairment Total Score: 19 High Level Balance High Level Balance Activites: Turns;Direction changes;Head turns High Level Balance Comments: Has difficulty with turns and direction changes as well as head turns.    End of Session PT - End of Session Equipment Utilized During Treatment: Gait belt Activity Tolerance: Patient limited by fatigue Patient left: in chair;with call bell/phone within reach;with family/visitor present Nurse Communication: Mobility status       INGOLD,Colena Ketterman 01/25/2013, 12:55 PM [facility] [phone] (pager)

## 2013-01-26 ENCOUNTER — Encounter (HOSPITAL_COMMUNITY)
Admission: EM | Disposition: A | Discharge status: Home / Self Care | Payer: Self-pay | Source: Home / Self Care | Attending: Neurology

## 2013-01-26 ENCOUNTER — Encounter (HOSPITAL_COMMUNITY): Payer: Self-pay | Admitting: *Deleted

## 2013-01-26 DIAGNOSIS — I059 Rheumatic mitral valve disease, unspecified: Secondary | ICD-10-CM

## 2013-01-26 HISTORY — PX: TEE WITHOUT CARDIOVERSION: SHX5443

## 2013-01-26 LAB — BASIC METABOLIC PANEL
BUN: 15 mg/dL (ref 6–23)
Calcium: 9.5 mg/dL (ref 8.4–10.5)
Creatinine, Ser: 1.22 mg/dL (ref 0.50–1.35)
GFR calc Af Amer: >90 mL/min (ref >90)
GFR calc non Af Amer: 78 mL/min — ABNORMAL LOW (ref >90)
Glucose, Bld: 99 mg/dL (ref 70–99)
Potassium: 3.7 mEq/L (ref 3.5–5.1)

## 2013-01-26 SURGERY — ECHOCARDIOGRAM, TRANSESOPHAGEAL
Anesthesia: Moderate Sedation

## 2013-01-26 MED ORDER — BUTAMBEN-TETRACAINE-BENZOCAINE 2-2-14 % EX AERO
INHALATION_SPRAY | CUTANEOUS | Status: DC | PRN
  Administered 2013-01-26: 2 via TOPICAL

## 2013-01-26 MED ORDER — SODIUM CHLORIDE 0.9 % IV SOLN: INTRAVENOUS | Status: DC

## 2013-01-26 MED ORDER — MIDAZOLAM HCL 10 MG/2ML IJ SOLN
INTRAMUSCULAR | Status: DC | PRN
  Administered 2013-01-26 (×2): 2 mg via INTRAVENOUS

## 2013-01-26 MED ORDER — FENTANYL CITRATE 0.05 MG/ML IJ SOLN
INTRAMUSCULAR | Status: DC | PRN
  Administered 2013-01-26 (×2): 25 ug via INTRAVENOUS

## 2013-01-26 NOTE — Progress Notes (Signed)
Echocardiogram Echocardiogram Transesophageal has been performed.  Nathan Baird 01/26/2013, 3:37 PM 

## 2013-01-26 NOTE — H&P (View-Only) (Signed)
 CARDIOLOGY CONSULT NOTE  Patient ID: Nathan Baird, MRN: [id number], DOB/AGE: 10/08/1980 32 y.o. Admit date: 01/23/2013   Date of Consult: 01/25/2013 Primary Physician: No PCP Per Patient Primary Cardiologist: Nahser  Chief Complaint: feeling "weird" on left side Reason for Consult: ? TEE vs initiate anticoagulation in setting of new CVA  HPI: Nathan Baird is a 32 y/o M with minimal PMH except cough and LV dysfunction identified this year. In 09/2012 he was evaluated by urgent care for persistent cough and PFTs showed a restrictive defect. He was referred to pulmonary 10/2012 at which time he was placed on GERD rx and short-course prednisone with plan to re-group, but did not show. Note his mother has history of sarcoidosis. Cardiomegaly was seen on CXR thus 2D echo was performed showing EF 20-25% with diffuse HK and severely dilated LV. He was referred to Dr. Nahser who placed him on Coreg. The patient apparently did not tolerate Lasix due to orthostatic hypotension. He was still able to be very active, playing basketball for several hours. He has not had cath or nuc. He was due to f/u end of June but per records did not show.  He presented to [facility] late on the evening of 01/23/13 via EMS feeling "weird on his left side. Symptoms were mostly in his left face and arm initially, but in the ER his symptoms worsened with weakness noted on the left side including the lower extremity and left facial droop. Code stroke was called at that time and he received TPA. MRI brain showed small acute R MCA infarct affecting pre-motor cortex, no mass effect or hemorrhage. Carotids and MRA unremarkable. 2D echo again demonstrates EF 20-25%, severely dilated LV. We are asked to see the patient to determine if TEE is warranted vs empiric initiation of full anticoagulation.  Patient reports for several years feeling "heart racing and pounding in chest" intermittently (on average once every 2 weeks). The episodes last  approximately 1 minute, does not cause him to stop whatever he is doing at the time, and is not associated with other symptoms such as chest pain, SOB, or dizziness. He continues to have persistent cough, usually productive with dark green, yellow, or clear sputum. Overall he feels his post-CVA condition is gradually improving. Still has left-sided facial tingling but otherwise doing well.   Mother has sarcoidosis and had PPM implanted at age 52; patient does not know reason for PPM. Sister is 22 y/o and in good health. Unaware of other conditions in family. He has no PMH of bleeding disorders, HTN, hyperlipidemia, or bleeding disorders. 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 Denies drug, tobacco, or alcohol use.   Past Medical History  Diagnosis Date  . Cough     a. PFTS 09/2012: restriction probable, further examinations recommended. Saw pulm 11/2012: placed on GERD rx and short course prednisone, planned to regroup.  . CHF (congestive heart failure)     echo 01/2013 LV severely dilated; EF 20-25%  . History of palpitations     2x/month for several years feels heart racing  . Cardiomegaly   . CVA (cerebral vascular accident) 01/23/2013    tPA administered; MRI brain showed small acute R MCA infarct      Most Recent Cardiac Studies: 2D Echo 01/24/13 - Left ventricle: The cavity size was severely dilated. Wall thickness was normal. Systolic function was severely reduced. The estimated ejection fraction was in the range of 20% to 25%. -   Atrial septum: No defect or patent foramen ovale was Identified.  2D echo 10/28/12 - Left ventricle: The cavity size was severely dilated. Wall thickness was normal. Systolic function was severely reduced. The estimated ejection fraction was in the range of 20% to 25%. Diffuse hypokinesis.   Surgical History:  Past Surgical History  Procedure Laterality Date  . No past surgeries       Home Meds: Prior to  Admission medications   Medication Sig Start Date End Date Taking? Authorizing Provider  carvedilol (COREG) 6.25 MG tablet Take 1 tablet (6.25 mg total) by mouth 2 (two) times daily. 12/02/12  Yes Philip J Nahser, MD    Inpatient Medications:  . aspirin EC  325 mg Oral Daily  . pantoprazole  40 mg Oral Daily      Allergies: No Known Allergies  History   Social History  . Marital Status: Single    Spouse Name: N/A    Number of Children: N/A  . Years of Education: N/A   Occupational History  . Not on file.   Social History Main Topics  . Smoking status: Never Smoker   . Smokeless tobacco: Not on file  . Alcohol Use: No  . Drug Use: No  . Sexually Active: Not on file   Other Topics Concern  . Not on file   Social History Narrative   Marital status: single     Children: 3 children (9, 1, 3month old)      Lives: with girlfriend.      Employment: works for [employer]      Tobacco: none      Alcohol: none      Drugs: none      Exercise: basketball every Friday night.     Family History  Problem Relation Age of Onset  . Sarcoidosis Mother   . Heart Problems Mother     PPM implantation age 52     Review of Systems: General: negative for chills, fever, night sweats or weight changes.  Cardiovascular: negative for chest pain, edema, orthopnea, palpitations, paroxysmal nocturnal dyspnea, shortness of breath or dyspnea on exertion Dermatological: negative for rash Respiratory: negative for cough or wheezing Urologic: negative for hematuria Abdominal: negative for nausea, vomiting, diarrhea, bright red blood per rectum, melena, or hematemesis Neurologic: negative for visual changes, syncope, or dizziness All other systems reviewed and are otherwise negative except as noted above.  Labs:  Recent Labs  01/23/13 2234  TROPONINI <0.30   Lab Results  Component Value Date   WBC 5.6 01/23/2013   HGB 15.6 01/23/2013   HCT 46.0 01/23/2013   MCV  86.8 01/23/2013   PLT 261 01/23/2013     Recent Labs Lab 01/23/13 2234 01/23/13 2244  NA 137 140  K 4.0 3.9  CL 102 106  CO2 26  --   BUN 16 16  CREATININE 1.39* 1.50*  CALCIUM 9.5  --   PROT 7.0  --   BILITOT 0.4  --   ALKPHOS 65  --   ALT 25  --   AST 31  --   GLUCOSE 94 95   Lab Results  Component Value Date   CHOL 167 01/24/2013   HDL 59 01/24/2013   LDLCALC 97 01/24/2013   TRIG 55 01/24/2013   Radiology/Studies:  Dg Chest 2 View  01/23/2013   *RADIOLOGY REPORT*  Clinical Data: Left arm weakness  CHEST - 2 VIEW  Comparison: 12/04/2012  Findings: Borderline cardiomegaly persists without   evidence for edema. Lung volumes are low with crowding of the bronchovascular markings.  No focal pulmonary opacity.  No pleural effusion. Minimal inferior endplate compression at T11 level is stable.  IMPRESSION: Borderline cardiomegaly without focal acute finding.   Original Report Authenticated By: Gretchen Green, M.D.   Ct Head Wo Contrast  01/23/2013   *RADIOLOGY REPORT*  Clinical Data: Left sided facial numbness and left arm numbness  CT HEAD WITHOUT CONTRAST  Technique:  Contiguous axial images were obtained from the base of the skull through the vertex without contrast.  Comparison: Head CT, 05/24/2009  Findings: There are no parenchymal masses or mass effect.  There are no areas of abnormal parenchymal attenuation.  There is no evidence of a recent transcortical infarct.  The ventricles are normal in size and configuration.  There are no extra-axial masses or abnormal fluid collections.  There is no intracranial hemorrhage.  The visualized sinuses and mastoid air cells are clear.  IMPRESSION: Normal unenhanced CT scan of the brain.   Original Report Authenticated By: David Ormond, M.D.   [name] Contrast  01/24/2013   *RADIOLOGY REPORT*  Clinical Data:  32-year-old male 24 hours after TPA treatment for left side numbness and facial droop.  Comparison: Head CTs without contrast 01/23/2013  and earlier.  MRI HEAD WITHOUT CONTRAST  Technique: Multiplanar, multiecho pulse sequences of the brain and surrounding structures were obtained according to standard protocol without intravenous contrast.  Findings: A small mixed nodular and linear area of restricted diffusion occurs in the right posterior frontal lobe gyrus, 1 gyrus anterior to the motor strip, along the superior lateral convexity. The abnormal area encompasses 14 x 13 mm transaxially with decreased signal on ADC.  There is subtle associated T2 and FLAIR hyperintensity, mostly cortical (series 6 and series 7 images 17 and 18).  No associated mass effect or hemorrhage.  Elsewhere normal diffusion, great, and white matter signal throughout the brain. Major intracranial vascular flow voids are preserved.  No midline shift, ventriculomegaly, mass effect, evidence of mass lesion, extra-axial collection or acute intracranial hemorrhage. Cervicomedullary junction and pituitary are within normal limits. Negative visualized cervical spine.  Normal bone marrow signal.  Visualized orbit soft tissues are within normal limits.  Small right maxillary sinus mucous retention cyst.  Other paranasal sinuses and mastoids are clear.  Chronic left lamina papyracea fracture.  Negative scalp soft tissues.  IMPRESSION: 1.  Small acute right MCA infarct effecting pre motor cortex.  No mass effect or hemorrhage. 2.  Otherwise normal noncontrast brain MRI. 3.  MRA findings are below.  MRA HEAD WITHOUT CONTRAST  Technique: Angiographic images of the Circle of Willis were obtained using MRA technique without  intravenous contrast.  Findings: Antegrade flow in the posterior circulation with dominant distal right vertebral artery, the left functionally terminates in PICA.  Normal right PICA.  No basilar artery stenosis.  SCA and PCA origins are normal.  Diminutive posterior communicating arteries. Bilateral PCA branches are within normal limits.  Antegrade flow in both ICA  siphons.  No ICA stenosis.  Normal ophthalmic and posterior communicating artery origins.  Normal carotid termini, MCA and ACA origins.  Anterior communicating artery, visualized ACA branches, and left MCA branches are within normal limits.  Right MCA M1 segment is normal.  Right MCA trifurcation is patent. Visualized right MCA branches are within normal limits.  No focal branch stenosis or major branch occlusion is identified.  IMPRESSION: Negative intracranial MRA.   No right MCA branch irregularity,   stenosis, or occlusion identified.   Original Report Authenticated By: H. Hall III, M.D.   Mr Mra Head/brain Wo Cm  01/24/2013   *RADIOLOGY REPORT*  Clinical Data:  32-year-old male 24 hours after TPA treatment for left side numbness and facial droop.  Comparison: Head CTs without contrast 01/23/2013 and earlier.  MRI HEAD WITHOUT CONTRAST  Technique: Multiplanar, multiecho pulse sequences of the brain and surrounding structures were obtained according to standard protocol without intravenous contrast.  Findings: A small mixed nodular and linear area of restricted diffusion occurs in the right posterior frontal lobe gyrus, 1 gyrus anterior to the motor strip, along the superior lateral convexity. The abnormal area encompasses 14 x 13 mm transaxially with decreased signal on ADC.  There is subtle associated T2 and FLAIR hyperintensity, mostly cortical (series 6 and series 7 images 17 and 18).  No associated mass effect or hemorrhage.  Elsewhere normal diffusion, great, and white matter signal throughout the brain. Major intracranial vascular flow voids are preserved.  No midline shift, ventriculomegaly, mass effect, evidence of mass lesion, extra-axial collection or acute intracranial hemorrhage. Cervicomedullary junction and pituitary are within normal limits. Negative visualized cervical spine.  Normal bone marrow signal.  Visualized orbit soft tissues are within normal limits.  Small right maxillary sinus mucous  retention cyst.  Other paranasal sinuses and mastoids are clear.  Chronic left lamina papyracea fracture.  Negative scalp soft tissues.  IMPRESSION: 1.  Small acute right MCA infarct effecting pre motor cortex.  No mass effect or hemorrhage. 2.  Otherwise normal noncontrast brain MRI. 3.  MRA findings are below.  MRA HEAD WITHOUT CONTRAST  Technique: Angiographic images of the Circle of Willis were obtained using MRA technique without  intravenous contrast.  Findings: Antegrade flow in the posterior circulation with dominant distal right vertebral artery, the left functionally terminates in PICA.  Normal right PICA.  No basilar artery stenosis.  SCA and PCA origins are normal.  Diminutive posterior communicating arteries. Bilateral PCA branches are within normal limits.  Antegrade flow in both ICA siphons.  No ICA stenosis.  Normal ophthalmic and posterior communicating artery origins.  Normal carotid termini, MCA and ACA origins.  Anterior communicating artery, visualized ACA branches, and left MCA branches are within normal limits.  Right MCA M1 segment is normal.  Right MCA trifurcation is patent. Visualized right MCA branches are within normal limits.  No focal branch stenosis or major branch occlusion is identified.  IMPRESSION: Negative intracranial MRA.   No right MCA branch irregularity, stenosis, or occlusion identified.   Original Report Authenticated By: H. Hall III, M.D.   EKG: NSR 69bpm, inf lat ST-T changes but similar territory to prior tracing Tele: NSR, sinus arrhythmia/sinus bradycardia, rare PVC  Physical Exam: Blood pressure 109/76, pulse 72, temperature 98.7 F (37.1 C), temperature source Oral, resp. rate 19, height 6' 2" (1.88 m), weight 255 lb (115.667 kg), SpO2 97.00%. General: Well developed, well nourished, healthy appearing black male in no acute distress. Head: Normocephalic, atraumatic, sclera non-icteric, no xanthomas, nares are without discharge.  Neck: Negative for carotid  bruits. JVD not elevated. Lungs: Clear bilaterally to auscultation without wheezes, rales, or rhonchi. Breathing is unlabored. Heart: RRR with S1 S2. No murmurs, rubs, or gallops appreciated. Abdomen: Soft, non-tender, non-distended with normoactive bowel sounds. No hepatomegaly. No rebound/guarding. No obvious abdominal masses. Msk:  Strength and tone appear normal for age. Extremities: No clubbing or cyanosis. No edema.  Distal pedal pulses are 2+ and equal bilaterally. Neuro: Alert   and oriented X 3. No facial asymmetry. No focal deficit. Moves all extremities spontaneously. Psych:  Responds to questions appropriately with a normal affect.   Assessment and Plan:   1. Acute CVA 2. Known LV dysfunction 3. Chronic cough with restrictive defect on PFTs 4. Renal insufficiency  Signed, Kate Daenzer, PA-S2 01/25/2013, 4:36 PM  Patient seen, examined. Available data reviewed. Agree with findings, assessment, and plan as outlined by Ms Daenzer. 32-year-old gentleman with no past medical history who presented with an acute ischemic stroke. Has new dx of DCM with LVEF 20-25%. Exam reveals muscular, healthy appearing male in NAD. Lungs clear, heart RRR, no murmur, no leg edema. Issues as follows:   Etiology of cardiomyopathy: no HTN or alcohol history. R/O CAD with a gated coronary CTA. May be familial but no clear history  Etiology of stroke, suspect cardioembolic: check TEE tomorrow. Reviewed risks and indication with patient. May need outpatient monitor if we do not anticoagulate him  Treatment of cardiomyopathy: on carvedilol. Wait on ACE until after CT / dye load.  Will hydrate overnight in anticipation of studies in the am.  Andyn Sales, M.D. 01/25/2013 5:08 PM     

## 2013-01-26 NOTE — Interval H&P Note (Signed)
History and Physical Interval Note:  01/26/2013 3:02 PM  Nathan Baird  has presented today for surgery, with the diagnosis of stroke  The various methods of treatment have been discussed with the patient and family. After consideration of risks, benefits and other options for treatment, the patient has consented to  Procedure(s): TRANSESOPHAGEAL ECHOCARDIOGRAM (TEE) (N/A) as a surgical intervention .  The patient's history has been reviewed, patient examined, no change in status, stable for surgery.  I have reviewed the patient's chart and labs.  Questions were answered to the patient's satisfaction.     Olga Millers

## 2013-01-26 NOTE — CV Procedure (Signed)
See full TEE report in [employer]; Severe global LV dysfunction (EF 15); mild spontaneous contrast LV; severe RV dysfunction; mild MR.

## 2013-01-26 NOTE — Progress Notes (Signed)
Occupational Therapy Treatment Patient Details Name: Nathan Baird MRN: [id number] DOB: [birth year] Today's Date: 01/26/2013 Time: 7829-5621 OT Time Calculation (min): 15 min  OT Assessment / Plan / Recommendation  History of present illness 32 year old admitted with L side weakness due to small R MCA infarct affecting premotor cortex.  s/p Tpa.    Clinical Impression Pt progressing well with OT. Pt has met acute care goals at this time. ot to sign off acutely and recommend outpatient follow up for high balance activities.   OT comments  No pain No dizziness Reports weakness in left le Reports hands and arms are at baseline  Follow Up Recommendations  Outpatient OT    Barriers to Discharge       Equipment Recommendations  None recommended by OT    Recommendations for Other Services    Frequency Min 2X/week   Progress towards OT Goals Progress towards OT goals: Goals met/education completed, patient discharged from OT  Plan Discharge plan remains appropriate    Precautions / Restrictions Precautions Precautions: Fall   Pertinent Vitals/Pain     ADL  Lower Body Dressing: Independent Where Assessed - Lower Body Dressing: Unsupported sit to stand Toilet Transfer: Modified independent Toilet Transfer Method: Sit to stand Toilet Transfer Equipment: Regular height toilet Tub/Shower Transfer: Modified independent Tub/Shower Transfer Method: Ambulating Equipment Used: Gait belt Transfers/Ambulation Related to ADLs: Pt ambulating with bil UE extended at times to guard. Pt reports "I am not dizzy but I feel like one leg is weaker" Pt with slight sway to the right side x2 during ambulation ADL Comments: pt completed bed mobility MOD I. Pt don shorts mod I. pt ambulated to the gym completed tub transfer. Pt transfered into tub no hands single leg standing. Pt completed 30 seconds eye closed and turned 360 degrees in tub surface. pt educated to have family near by first few times  showering in case (A) is needed. Pt located room without deficits. Pt pleasant and ready to return to basketball . Pt educated on shooting basketball would be okay but playing basketball will have to be cleared by MD    OT Diagnosis:    OT Problem List:   OT Treatment Interventions:     OT Goals(current goals can now be found in the care plan section) Acute Rehab OT Goals Patient Stated Goal: Return to active lifestyle. OT Goal Formulation: With patient Time For Goal Achievement: 02/01/13 Potential to Achieve Goals: Good ADL Goals Pt Will Perform Grooming: standing;Independently Pt Will Perform Lower Body Bathing: Independently;sit to/from stand Pt Will Perform Lower Body Dressing: Independently;sit to/from stand Pt Will Transfer to Toilet: Independently;ambulating Pt Will Perform Toileting - Clothing Manipulation and hygiene: Independently;sit to/from stand Pt Will Perform Tub/Shower Transfer: Tub transfer;ambulating;with supervision Pt/caregiver will Perform Home Exercise Program: For increased strengthening;Independently Additional ADL Goal #1: Pt will be able to stand with eyes closed supported by UE simulating showering x 30 secs with supervision.  Visit Information  Last OT Received On: 01/26/13 Assistance Needed: +1 History of Present Illness: 32 year old admitted with L side weakness due to small R MCA infarct affecting premotor cortex.  s/p Tpa.     Subjective Data      Prior Functioning       Cognition  Cognition Arousal/Alertness: Awake/alert Behavior During Therapy: WFL for tasks assessed/performed Overall Cognitive Status: Within Functional Limits for tasks assessed    Mobility  Bed Mobility Supine to Sit: 7: Independent Sitting - Scoot to Edge of Bed:  7: Independent Transfers Transfers: Sit to Stand Sit to Stand: 7: Independent Stand to Sit: 7: Independent    Exercises      Balance High Level Balance High Level Balance Activites: Turns;Sudden  stops;Direction changes High Level Balance Comments: pt required extra steps to change directions quickly. Pt with high balance deficits noted. OT to call PT assigned to help facilitate incr high level balance challenges   End of Session OT - End of Session Activity Tolerance: Patient tolerated treatment well Patient left: in bed;with call bell/phone within reach Nurse Communication: Mobility status;Precautions  GO     Lucile Shutters 01/26/2013, 10:27 AM Pager: [phone]

## 2013-01-26 NOTE — Progress Notes (Signed)
Physical Therapy Treatment Patient Details Name: Nathan Baird MRN: [id number] DOB: [birth year] Today's Date: 01/26/2013 Time: 6213-0865 PT Time Calculation (min): 23 min  PT Assessment / Plan / Recommendation  History of Present Illness 32 year old admitted with L side weakness due to small R MCA infarct affecting premotor cortex.  s/p Tpa.    Clinical Impression Emphasis on high level balance tasks.  No overt LOB, but decr. Coordination and clumsiness with fatigue.   He will benefit greatly from OPPT      Follow Up Recommendations  Outpatient PT;Supervision - Intermittent     Does the patient have the potential to tolerate intense rehabilitation     Barriers to Discharge        Equipment Recommendations  None recommended by PT    Recommendations for Other Services    Frequency Min 4X/week   Progress towards PT Goals Progress towards PT goals: Progressing toward goals  Plan Current plan remains appropriate    Precautions / Restrictions Precautions Precautions: Fall Restrictions Weight Bearing Restrictions: No   Pertinent Vitals/Pain     Mobility  Bed Mobility Bed Mobility: Supine to Sit;Sitting - Scoot to Edge of Bed;Sit to Supine Supine to Sit: 7: Independent Sitting - Scoot to Edge of Bed: 7: Independent Sit to Supine: 7: Independent Transfers Transfers: Sit to Stand;Stand to Sit Sit to Stand: 7: Independent Stand to Sit: 7: Independent Ambulation/Gait Ambulation/Gait Assistance: 4: Min assist Ambulation Distance (Feet): 900 Feet Assistive device: None Ambulation/Gait Assistance Details: Challenged pt with progressively incr. speed abrupt changes in direction, hopping laterally over broad space going forward and back, braiding and up/down stairs, no hands forward and back.  One fatigued, pt started circumducting and weaker df and foot flat contact.  L foot showed clumsy movement in braiding both directions. Stairs: Yes Stairs Assistance: 5: Supervision Stairs  Assistance Details (indicate cue type and reason): exaggerated stepping with L foot to clear toes. mildly ataxic once fatigue started. Stair Management Technique: No rails;One rail Right;Alternating pattern;Backwards;Forwards Number of Stairs: 9 Wheelchair Mobility Wheelchair Mobility: No Modified Rankin (Stroke Patients Only) Pre-Morbid Rankin Score: No symptoms Modified Rankin: Moderate disability    Exercises     PT Diagnosis:    PT Problem List:   PT Treatment Interventions:     PT Goals (current goals can now be found in the care plan section) Acute Rehab PT Goals Patient Stated Goal: Return to active lifestyle. PT Goal Formulation: With patient Time For Goal Achievement: 02/01/13 Potential to Achieve Goals: Good  Visit Information  Last PT Received On: 01/26/13 Assistance Needed: +1 History of Present Illness: 32 year old admitted with L side weakness due to small R MCA infarct affecting premotor cortex.  s/p Tpa.     Subjective Data  Subjective: I'm going to get really (emotional) about this when I get back to the room Patient Stated Goal: Return to active lifestyle.   Cognition  Cognition Arousal/Alertness: Awake/alert Behavior During Therapy: WFL for tasks assessed/performed Overall Cognitive Status: Within Functional Limits for tasks assessed    Balance  High Level Balance High Level Balance Activites: Side stepping;Braiding;Backward walking;Direction changes;Turns;Sudden stops;Head turns High Level Balance Comments: No LOB, but noticeably less coordinated with higher level tasks (braiding) or when fatigued  End of Session PT - End of Session Equipment Utilized During Treatment: Gait belt Activity Tolerance: Patient tolerated treatment well;Patient limited by fatigue Patient left: in bed;with call bell/phone within reach Nurse Communication: Mobility status   GP  Koda Routon, Eliseo Gum 01/26/2013, 1:34 PM 01/26/2013  [place] Bing,  PT [phone]  (pager)

## 2013-01-26 NOTE — Progress Notes (Signed)
Pt seen in TEE suite. Reviewed images with Dr Jens Som. Notable findings are very severe LV dysfunction, spontaneous contrast in the LV, and PFO with left-to-right shunting. Will cancel coronary CT and arrange for further outpatient evaluation of ischemic disease as a potential etiology of his cardiomyopathy.  Tonny Bollman 01/26/2013 3:24 PM

## 2013-01-26 NOTE — Progress Notes (Signed)
IV team unable to obtain an 18G in either [facility] Right AC is very sore from earlier infiltration and L AC has a 20G.  Read note with plan to cancel cardiac CT and arrange outpatient instead, so will no longer attempt to get IV since pt c/o pain in the Right AC. [place] aware.

## 2013-01-26 NOTE — Progress Notes (Signed)
Occupational Therapy Discharge Patient Details Name: Nathan Baird MRN: [id number] DOB: February 19, 1981 Today's Date: 01/26/2013 Time: 4098-1191 OT Time Calculation (min): 15 min  Patient discharged from OT services secondary to goals met and no further OT needs identified.  Please see latest therapy progress note for current level of functioning and progress toward goals.    Progress and discharge plan discussed with patient and/or caregiver: Patient/Caregiver agrees with plan  GO     Lucile Shutters Pager: [phone]  01/26/2013, 10:28 AM

## 2013-01-26 NOTE — Progress Notes (Signed)
Upon AM assessment noticed Nathan Baird had a LAC and a RAC IV, both 20G. For cardiac CT Nathan Baird needs 18G in an [facility]. Paged IV team to see if they could place an 18G. Waiting for IV RN to come and assess. [place] aware of the delay in getting an IV. Cheral Cappucci, [place] Marie, RN

## 2013-01-26 NOTE — Progress Notes (Signed)
Stroke Team Progress Note  HISTORY 32 y.o. male who was at work today 01/24/2013 when he began to feel "weird" on the left side. Symptoms were mostly in his left face and arm. EMS was called and the patient was brought in for evaluation. While in the ED symptoms worsened with weakness noted on the left side including the lower extremity and left facial droop. Code stroke was called at that time. Post tpa administration, He was admitted to the neuro ICU for further evaluation and treatment.  SUBJECTIVE Patient preparing for studies. No new symptoms.   OBJECTIVE Most recent Vital Signs: Filed Vitals:   01/25/13 2124 01/26/13 0105 01/26/13 0511 01/26/13 1014  BP: 118/81 98/60 99/65  123/75  Pulse: 81 70 64 65  Temp: 97.5 F (36.4 C) 98.4 F (36.9 C) 98.4 F (36.9 C) 98.1 F (36.7 C)  TempSrc: Oral Oral Oral Oral  Resp: 20 20 20 20   Height:      Weight:      SpO2: 99% 94% 98% 100%   CBG (last 3)   Recent Labs  01/23/13 2230  GLUCAP 89    IV Fluid Intake:   . sodium chloride 75 mL/hr at 01/25/13 2240    MEDICATIONS  . aspirin EC  325 mg Oral Daily  . carvedilol  6.25 mg Oral BID   PRN:  acetaminophen, acetaminophen, senna-docusate  Diet:  NPO for procedure (thin liquids) Activity:  Bedrest DVT Prophylaxis:  SCDs   CLINICALLY SIGNIFICANT STUDIES Basic Metabolic Panel:   Recent Labs Lab 01/23/13 2234 01/23/13 2244 01/26/13 0545  NA 137 140 138  K 4.0 3.9 3.7  CL 102 106 103  CO2 26  --  24  GLUCOSE 94 95 99  BUN 16 16 15   CREATININE 1.39* 1.50* 1.22  CALCIUM 9.5  --  9.5   Liver Function Tests:   Recent Labs Lab 01/23/13 2234  AST 31  ALT 25  ALKPHOS 65  BILITOT 0.4  PROT 7.0  ALBUMIN 3.9   CBC:   Recent Labs Lab 01/23/13 2234 01/23/13 2244  WBC 5.6  --   NEUTROABS 1.9  --   HGB 14.4 15.6  HCT 43.4 46.0  MCV 86.8  --   PLT 261  --    Coagulation:   Recent Labs Lab 01/23/13 2234  LABPROT 13.8  INR 1.08   Cardiac Enzymes:   Recent  Labs Lab 01/23/13 2234  TROPONINI <0.30   Urinalysis: No results found for this basename: COLORURINE, APPERANCEUR, LABSPEC, PHURINE, GLUCOSEU, HGBUR, BILIRUBINUR, KETONESUR, PROTEINUR, UROBILINOGEN, NITRITE, LEUKOCYTESUR,  in the last 168 hours Lipid Panel    Component Value Date/Time   CHOL 167 01/24/2013 0400   TRIG 55 01/24/2013 0400   HDL 59 01/24/2013 0400   CHOLHDL 2.8 01/24/2013 0400   VLDL 11 01/24/2013 0400   LDLCALC 97 01/24/2013 0400   HgbA1C  Lab Results  Component Value Date   HGBA1C 5.6 01/24/2013    Urine Drug Screen:   No results found for this basename: labopia,  cocainscrnur,  labbenz,  amphetmu,  thcu,  labbarb    Alcohol Level: No results found for this basename: ETH,  in the last 168 hours   CT of the brain  01/23/2013    Normal unenhanced CT scan of the brain.     MRI of the brain  01/24/2013   1.  Small acute right MCA infarct effecting pre motor cortex.  No mass effect or hemorrhage. 2.  Otherwise normal noncontrast brain  MRI.  MRA of the brain  01/24/2013   Negative intracranial MRA.   No right MCA branch irregularity, stenosis, or occlusion identified.   2D Echocardiogram  EF 20-25% with no source of embolus. Systolic function severely reduced.  Carotid Doppler  No evidence of hemodynamically significant internal carotid artery stenosis. Vertebral artery flow is antegrade.   CXR  01/23/2013   Borderline cardiomegaly without focal acute finding.     TEE Severe LV dysfunction, spontaneous contrast in the LV, and PFO with left-to-right shunting  EKG  normal EKG, normal sinus rhythm, nonspecific  T wave changes.   Therapy Recommendations OUTPATIENT  GENERAL EXAM:  Patient is in no distress  CARDIOVASCULAR:  Regular rate and rhythm, no murmurs, no carotid bruits  NEUROLOGIC:  MENTAL STATUS: awake, alert, language fluent, comprehension intact, naming intact  CRANIAL NERVE: no papilledema on fundoscopic exam, pupils equal and reactive to light, visual  fields full to confrontation, extraocular muscles intact, no nystagmus, DECR SENS AND STRENGTH IN LEFT FACE, uvula midline, shoulder shrug symmetric, tongue midline.  MOTOR: normal bulk and tone, full strength in the RUE AND RLE. LUE 4, LLE 4.  SENSORY: DECR IN LUE AND LLE  COORDINATION: NO ATAXIA. SLOW MOVEMENTS IN LUE   ASSESSMENT [name] is a 32 y.o. male presenting with left hemiparesis. Status post IV t-PA 01/24/2013 at 0014. Imaging confirms a small right MCA infarct in the pre motor cortex. Infarct felt to be embolic secondary to likely cardiac source, workup underway.  On no antithrombotic prior to admission. Now on aspirin 325 mg orally every day for secondary stroke prevention. Work up underway.   Idiopathic CHF Dx'd April 2014  Hospital day # 3  TREATMENT/PLAN  Continue aspirin 325 mg orally every day for secondary stroke prevention.  Outpatient therapy  Results of TEE discussed with Dr. Excell Seltzer. Patient would be candidate for coumadin; however will need to discuss with patient the commitment with taking this drug for indefinite time peroid. Otherwise, we discussed that perhaps Xarelto may be an alternative. He will see patient in am. Coronary CT canceled as ischemic workup can be completed as an outpatient.  Gwendolyn Lima. Manson Passey, [facility], MBA, MHA [facility] Pager: [phone] 01/26/2013 8:19 PM  I have personally obtained a history, examined the patient, evaluated imaging results, and formulated the assessment and plan of care. I agree with the above.  Delia Heady, MD

## 2013-01-26 NOTE — Evaluation (Signed)
Speech Language Pathology Evaluation Patient Details Name: Nathan Baird MRN: [id number] DOB: [birth year] Today's Date: 01/26/2013 Time: 4098-1191 SLP Time Calculation (min): 17 min  Problem List:  Patient Active Problem List   Diagnosis Date Noted  . Congestive dilated cardiomyopathy 01/25/2013  . Stroke, acute, embolic 01/25/2013  . Cough 10/20/2012  . Chronic systolic congestive heart failure 10/20/2012  . Penile pain 05/19/2012  . ATTENTION DEFICIT, W/HYPERACTIVITY 09/04/2006   Past Medical History:  Past Medical History  Diagnosis Date  . Cough     a. PFTS 09/2012: restriction probable, further examinations recommended. Saw pulm 11/2012: placed on GERD rx and short course prednisone, planned to regroup.  . CHF (congestive heart failure)     echo 01/2013 LV severely dilated; EF 20-25%  . History of palpitations     2x/month for several years feels heart racing  . Cardiomegaly   . CVA (cerebral vascular accident) 01/23/2013    tPA administered; MRI brain showed small acute R MCA infarct   Past Surgical History:  Past Surgical History  Procedure Laterality Date  . No past surgeries     HPI:  32 year old male admitted 01/23/13 after sudden onset of "weird" sensation in left face and LUE.  Pt given tPA in ED. BSE/SLE ordered per stroke protocol.  CT negative.   Assessment / Plan / Recommendation Clinical Impression  Pts cognitive linguistic function WNL; pt agrees speech has returned to baseline.  Unable to observe pt with PO as he is NPO for TEE. Pt WNL with Regular solid at last visit, reports he is tolerating well.  Education complete, no SLP f/u needed. Will sign off.     SLP Assessment  Patient does not need any further Speech Lanaguage Pathology Services    Follow Up Recommendations       Frequency and Duration        Pertinent Vitals/Pain NA   SLP Goals     SLP Evaluation Prior Functioning  Cognitive/Linguistic Baseline: Within functional limits Type of  Home: Apartment Available Help at Discharge: Family;Available 24 hours/day Vocation: Full time employment   Cognition  Overall Cognitive Status: Within Functional Limits for tasks assessed Orientation Level: Oriented X4    Comprehension  Auditory Comprehension Overall Auditory Comprehension: Appears within functional limits for tasks assessed    Expression Verbal Expression Overall Verbal Expression: Appears within functional limits for tasks assessed   Oral / Motor Oral Motor/Sensory Function Overall Oral Motor/Sensory Function: Appears within functional limits for tasks assessed Motor Speech Overall Motor Speech: Appears within functional limits for tasks assessed   GO    [name], MA CCC-SLP [phone]  Claudine Mouton 01/26/2013, 12:01 PM

## 2013-01-27 ENCOUNTER — Other Ambulatory Visit: Payer: Self-pay

## 2013-01-27 ENCOUNTER — Encounter (HOSPITAL_COMMUNITY): Payer: Self-pay | Admitting: Physician Assistant

## 2013-01-27 DIAGNOSIS — Q2112 Patent foramen ovale: Secondary | ICD-10-CM

## 2013-01-27 DIAGNOSIS — I429 Cardiomyopathy, unspecified: Secondary | ICD-10-CM

## 2013-01-27 DIAGNOSIS — Z79899 Other long term (current) drug therapy: Secondary | ICD-10-CM

## 2013-01-27 DIAGNOSIS — I509 Heart failure, unspecified: Secondary | ICD-10-CM

## 2013-01-27 DIAGNOSIS — Q211 Atrial septal defect: Secondary | ICD-10-CM

## 2013-01-27 DIAGNOSIS — I5022 Chronic systolic (congestive) heart failure: Secondary | ICD-10-CM

## 2013-01-27 LAB — BASIC METABOLIC PANEL
BUN: 16 mg/dL (ref 6–23)
Calcium: 9.8 mg/dL (ref 8.4–10.5)
GFR calc Af Amer: >90 mL/min (ref >90)
GFR calc non Af Amer: 84 mL/min — ABNORMAL LOW (ref >90)
Glucose, Bld: 100 mg/dL — ABNORMAL HIGH (ref 70–99)
Potassium: 4 mEq/L (ref 3.5–5.1)
Sodium: 139 mEq/L (ref 135–145)

## 2013-01-27 MED ORDER — LOSARTAN POTASSIUM 25 MG PO TABS
25.0000 mg | ORAL_TABLET | Freq: Every day | ORAL | Status: DC
  Administered 2013-01-27: 25 mg via ORAL
  Filled 2013-01-27: qty 1

## 2013-01-27 MED ORDER — RIVAROXABAN 20 MG PO TABS: 20.0000 mg | ORAL_TABLET | Freq: Every day | ORAL | Status: DC

## 2013-01-27 MED ORDER — LOSARTAN POTASSIUM 25 MG PO TABS: 25.0000 mg | ORAL_TABLET | Freq: Every day | ORAL | Status: DC

## 2013-01-27 MED ORDER — RIVAROXABAN 20 MG PO TABS
20.0000 mg | ORAL_TABLET | Freq: Every day | ORAL | Status: DC
  Filled 2013-01-27: qty 1

## 2013-01-27 NOTE — Care Management Note (Signed)
    Page 1 of 1   01/27/2013     2:41:41 PM   CARE MANAGEMENT NOTE 01/27/2013  Patient:  Nathan Baird, Nathan Baird   Account Number:  [id number]  Date Initiated:  01/27/2013  Documentation initiated by:  Jiles Crocker  Subjective/Objective Assessment:   ADMITTED WITH LT SIDED WEAKNESS     Action/Plan:   LIVES AT HOME WITH SPOUSE; CM FOLLOWING FOR DCP; POSSIBLE NEED OUTPATIENT REHAB AT DISCHARGE   Anticipated DC Date:  01/30/2013   Anticipated DC Plan:  HOME/SELF CARE      DC Planning Services  CM consult      Choice offered to / List presented to:             Status of service:  Completed, signed off Medicare Important Message given?  NA - LOS <3 / Initial given by admissions (If response is "NO", the following Medicare IM given date fields will be blank) Date Medicare IM given:   Date Additional Medicare IM given:    Discharge Disposition:  HOME/SELF CARE  Per UR Regulation:  Reviewed for med. necessity/level of care/duration of stay  If discussed at Long Length of Stay Meetings, dates discussed:    Comments:  01/27/13 1430 Elmer Bales RN, MSN, CM- Met with patient and family to discuss outpatient PT/OT.  Pt is agreeable to [facility].  Information was faxed out and written information was provided.  01/27/2013- B CHANDLER RN,BSN,MHA

## 2013-01-27 NOTE — Progress Notes (Signed)
Our office is working on scheduling cardiac MRI - spoke with Leotis Shames, RN at our office. He will have BMET in 1 week to ensure stability of renal function given initiation of ARB, then cardiac MRI soon after and then follow up appointment in our office. Our office is working on scheduling these things and will call him with dates/times. Meliah Appleman PA-C

## 2013-01-27 NOTE — Progress Notes (Signed)
Stroke Discharge Summary  Patient ID: Nathan Baird   MRN: [id number]      DOB: [birth year]  Date of Admission: 01/23/2013 Date of Discharge: 01/27/2013  Attending Physician:  Darcella Cheshire, MD, Stroke MD  Consulting Physician(s):   Treatment Team:  Rounding L[facility], MD   Patient's PCP:  No PCP Per Patient  Discharge Diagnoses:  Principal Problem:   Stroke, acute, embolic Active Problems:   Congestive dilated cardiomyopathy   Encounter for long-term (current) use of high-risk medication   Patent foramen ovale with right to left shunt  BMI: Body mass index is 30.31 kg/(m^2).  Past Medical History  Diagnosis Date  . Cough     a. PFTS 09/2012: restriction probable, further examinations recommended. Saw pulm 11/2012: placed on GERD rx and short course prednisone, planned to regroup.  . LV dysfunction     a. Identified 10/2012 by echo EF 20-25%. b. Echo 01/2013: EF 20-25%, TEE EF 15% with severe RV dysfunction as well.  [name] History of palpitations     2x/month for several years feels heart racing  . CVA (cerebral vascular accident) 01/23/2013    tPA administered; MRI brain showed small acute R MCA infarct  . PFO (patent foramen ovale)     a. By TEE 01/2013.   Past Surgical History  Procedure Laterality Date  . No past surgeries    . Tee without cardioversion N/A 01/26/2013    Procedure: TRANSESOPHAGEAL ECHOCARDIOGRAM (TEE);  Surgeon: [name], MD;  Location: [facility] ENDOSCOPY;  Service: Cardiovascular;  Laterality: N/A;      Medication List         carvedilol 6.25 MG tablet  Commonly known as:  COREG  Take 1 tablet (6.25 mg total) by mouth 2 (two) times daily.     losartan 25 MG tablet  Commonly known as:  COZAAR  Take 1 tablet (25 mg total) by mouth daily.     Rivaroxaban 20 MG Tabs  Commonly known as:  XARELTO  Take 1 tablet (20 mg total) by mouth daily.        LABORATORY STUDIES CBC    Component Value Date/Time   WBC 5.6 01/23/2013 2234   WBC 7.2  05/28/2012 1745   RBC 5.00 01/23/2013 2234   RBC 4.63* 05/28/2012 1745   HGB 15.6 01/23/2013 2244   HGB 13.5* 05/28/2012 1745   HCT 46.0 01/23/2013 2244   HCT 42.8* 05/28/2012 1745   PLT 261 01/23/2013 2234   MCV 86.8 01/23/2013 2234   MCV 92.5 05/28/2012 1745   MCH 28.8 01/23/2013 2234   MCH 29.2 05/28/2012 1745   MCHC 33.2 01/23/2013 2234   MCHC 31.5* 05/28/2012 1745   RDW 12.5 01/23/2013 2234   LYMPHSABS 3.1 01/23/2013 2234   MONOABS 0.4 01/23/2013 2234   EOSABS 0.2 01/23/2013 2234   BASOSABS 0.0 01/23/2013 2234   CMP    Component Value Date/Time   NA 139 01/27/2013 0518   K 4.0 01/27/2013 0518   CL 103 01/27/2013 0518   CO2 24 01/27/2013 0518   GLUCOSE 100* 01/27/2013 0518   BUN 16 01/27/2013 0518   CREATININE 1.14 01/27/2013 0518   CALCIUM 9.8 01/27/2013 0518   PROT 7.0 01/23/2013 2234   ALBUMIN 3.9 01/23/2013 2234   AST 31 01/23/2013 2234   ALT 25 01/23/2013 2234   ALKPHOS 65 01/23/2013 2234   BILITOT 0.4 01/23/2013 2234   GFRNONAA 84* 01/27/2013 0518   GFRAA >90 01/27/2013 0518   COAGS  Lab Results  Component Value Date   INR 1.08 01/23/2013   Lipid Panel    Component Value Date/Time   CHOL 167 01/24/2013 0400   TRIG 55 01/24/2013 0400   HDL 59 01/24/2013 0400   CHOLHDL 2.8 01/24/2013 0400   VLDL 11 01/24/2013 0400   LDLCALC 97 01/24/2013 0400   HgbA1C  Lab Results  Component Value Date   HGBA1C 5.6 01/24/2013   Cardiac Panel (last 3 results) No results found for this basename: CKTOTAL, CKMB, TROPONINI, RELINDX,  in the last 72 hours Urinalysis    Component Value Date/Time   COLORURINE YELLOW 12/16/2012 1925   APPEARANCEUR CLEAR 12/16/2012 1925   LABSPEC 1.035* 12/16/2012 1925   PHURINE 5.5 12/16/2012 1925   GLUCOSEU NEGATIVE 12/16/2012 1925   HGBUR NEGATIVE 12/16/2012 1925   BILIRUBINUR SMALL* 12/16/2012 1925   KETONESUR 15* 12/16/2012 1925   PROTEINUR NEGATIVE 12/16/2012 1925   UROBILINOGEN 1.0 12/16/2012 1925   NITRITE NEGATIVE 12/16/2012 1925   LEUKOCYTESUR NEGATIVE 12/16/2012  1925   Urine Drug Screen  No results found for this basename: labopia,  cocainscrnur,  labbenz,  amphetmu,  thcu,  labbarb    Alcohol Level No results found for this basename: eth     SIGNIFICANT DIAGNOSTIC STUDIES Urine Drug Screen:  No results found for this basename: labopia, cocainscrnur, labbenz, amphetmu, thcu, labbarb    Alcohol Level: No results found for this basename: ETH, in the last 168 hours  CT of the brain 01/23/2013 Normal unenhanced CT scan of the brain.  MRI of the brain 01/24/2013 1. Small acute right MCA infarct effecting pre motor cortex. No mass effect or hemorrhage. 2. Otherwise normal noncontrast brain MRI.  MRA of the brain 01/24/2013 Negative intracranial MRA. No right MCA branch irregularity, stenosis, or occlusion identified.  2D Echocardiogram EF 20-25% with no source of embolus. Systolic function severely reduced.  Carotid Doppler No evidence of hemodynamically significant internal carotid artery stenosis. Vertebral artery flow is antegrade.  CXR 01/23/2013 Borderline cardiomegaly without focal acute finding.  TEE Severe LV dysfunction, spontaneous contrast in the LV, and PFO with left-to-right shunting  EKG normal EKG, normal sinus rhythm, nonspecific T wave changes.  Therapy Recommendations OUTPATIENT      History of Present Illness    32 y.o. male who was at work 01/24/2013 when he began to feel "weird" on the left side. Symptoms were mostly in his left face and arm. EMS was called and the patient was brought in for evaluation. While in the ED symptoms worsened with weakness noted on the left side including the lower extremity and left facial droop. Code stroke was called at that time. Post tpa administration, He was admitted to the neuro ICU for further evaluation and treatment   Hospital Course   [name] is a 32 y.o. male presenting with left hemiparesis. Status post IV t-PA 01/24/2013 at 0014. Imaging confirmed a small right MCA infarct in  the pre motor cortex. Infarct felt to be embolic secondary to likely cardiac source, workup underway. On no antithrombotic prior to admission. Now on aspirin 325 mg orally every day for secondary stroke prevention. Idiopathic CHF Dx'd April 2014. Placed on Coreg. Now ARB added. Ischemic workup as outpatient. Results of TEE discussed with Dr. Excell Seltzer. Patient will be placed on xarelto long term.  LDL 97, will not place on statin as no atherosclerotic disease as symptoms felt secondary to severe cardiac function  Cardiac MRI as outpatient for ischemic workup.  No  sports or rigorous physical activity due to severe decreased heart function.  Follow up with cardiology as arranged. Follow up with Dr. Pearlean Brownie in 2 months   Patient with continued stroke symptoms of left hemiparesis. Physical therapy, occupational therapy and speech therapy evaluated patient. They recommend outpatient therapy  Discharge Exam  Blood pressure 109/73, pulse 76, temperature 98.5 F (36.9 C), temperature source Oral, resp. rate 20, height 6\' 3"  (1.905 m), weight 110 kg (242 lb 8.1 oz), SpO2 100.00%.  GENERAL EXAM:  Patient is in no distress  CARDIOVASCULAR:  Regular rate and rhythm, no murmurs, no carotid bruits  NEUROLOGIC:  MENTAL STATUS: awake, alert, language fluent, comprehension intact, naming intact  CRANIAL NERVE: no papilledema on fundoscopic exam, pupils equal and reactive to light, visual fields full to confrontation, extraocular muscles intact, no nystagmus, DECR SENS AND STRENGTH IN LEFT FACE, uvula midline, shoulder shrug symmetric, tongue midline.  MOTOR: normal bulk and tone, full strength in the RUE AND RLE. LUE 4, LLE 4.  SENSORY: DECR IN LUE AND LLE  COORDINATION: NO ATAXIA. SLOW MOVEMENTS IN LUE   Discharge Diet   Cardiac thin liquids  Discharge Plan    Disposition:  home   xarelto 20mg  daily for secondary stroke prevention.  Ongoing risk factor control by Primary Care Physician. Risk factor  recommendations:  Hypertension target range 130-140/70-80 Lipid range - LDL < 100 and checked every 6 months, fasting   Follow-up with primary MD in 1 month.  Follow-up with Dr. Delia Heady, Stroke [facility] for TCD Bubble and emboli.  Follow-up with Cardiology as they have set up.  35 minutes were spent preparing discharge.  Signed  Gwendolyn Lima. Manson Passey, [facility], MBA, MHA [facility] Pager: [phone] 01/27/2013 1:19 PM  I have personally examined this patient, reviewed pertinent data and developed the plan of care. I agree with above. Delia Heady, MD

## 2013-01-27 NOTE — Progress Notes (Signed)
Stroke Team Progress Note  HISTORY 32 y.o. male who was at work today 01/24/2013 when he began to feel "weird" on the left side. Symptoms were mostly in his left face and arm. EMS was called and the patient was brought in for evaluation. While in the ED symptoms worsened with weakness noted on the left side including the lower extremity and left facial droop. Code stroke was called at that time. Post tpa administration, He was admitted to the neuro ICU for further evaluation and treatment.  SUBJECTIVE Pt already seen by Dr. Excell Seltzer. Ready to go home. No new symptoms.  OBJECTIVE Most recent Vital Signs: Filed Vitals:   01/26/13 2139 01/27/13 0217 01/27/13 0648 01/27/13 0939  BP: 108/69 103/59 107/66 109/73  Pulse: 58 59 68 76  Temp: 98.4 F (36.9 C) 98.3 F (36.8 C) 97.9 F (36.6 C) 98.5 F (36.9 C)  TempSrc: Oral Oral Oral Oral  Resp: 20 20 20 20   Height:      Weight:      SpO2: 99% 97% 97% 100%   CBG (last 3)  No results found for this basename: GLUCAP,  in the last 72 hours  IV Fluid Intake:   . sodium chloride 75 mL/hr at 01/25/13 2240    MEDICATIONS  . aspirin EC  325 mg Oral Daily  . carvedilol  6.25 mg Oral BID  . losartan  25 mg Oral Daily   PRN:  acetaminophen, acetaminophen, senna-docusate  Diet:  Cardiac for procedure (thin liquids) Activity: up as tolerated DVT Prophylaxis:  SCDs   CLINICALLY SIGNIFICANT STUDIES Basic Metabolic Panel:   Recent Labs Lab 01/26/13 0545 01/27/13 0518  NA 138 139  K 3.7 4.0  CL 103 103  CO2 24 24  GLUCOSE 99 100*  BUN 15 16  CREATININE 1.22 1.14  CALCIUM 9.5 9.8   Liver Function Tests:   Recent Labs Lab 01/23/13 2234  AST 31  ALT 25  ALKPHOS 65  BILITOT 0.4  PROT 7.0  ALBUMIN 3.9   CBC:   Recent Labs Lab 01/23/13 2234 01/23/13 2244  WBC 5.6  --   NEUTROABS 1.9  --   HGB 14.4 15.6  HCT 43.4 46.0  MCV 86.8  --   PLT 261  --    Coagulation:   Recent Labs Lab 01/23/13 2234  LABPROT 13.8  INR 1.08    Cardiac Enzymes:   Recent Labs Lab 01/23/13 2234  TROPONINI <0.30   Urinalysis: No results found for this basename: COLORURINE, APPERANCEUR, LABSPEC, PHURINE, GLUCOSEU, HGBUR, BILIRUBINUR, KETONESUR, PROTEINUR, UROBILINOGEN, NITRITE, LEUKOCYTESUR,  in the last 168 hours Lipid Panel    Component Value Date/Time   CHOL 167 01/24/2013 0400   TRIG 55 01/24/2013 0400   HDL 59 01/24/2013 0400   CHOLHDL 2.8 01/24/2013 0400   VLDL 11 01/24/2013 0400   LDLCALC 97 01/24/2013 0400   HgbA1C  Lab Results  Component Value Date   HGBA1C 5.6 01/24/2013    Urine Drug Screen:   No results found for this basename: labopia,  cocainscrnur,  labbenz,  amphetmu,  thcu,  labbarb    Alcohol Level: No results found for this basename: ETH,  in the last 168 hours   CT of the brain  01/23/2013    Normal unenhanced CT scan of the brain.     MRI of the brain  01/24/2013   1.  Small acute right MCA infarct effecting pre motor cortex.  No mass effect or hemorrhage. 2.  Otherwise normal  noncontrast brain MRI.  MRA of the brain  01/24/2013   Negative intracranial MRA.   No right MCA branch irregularity, stenosis, or occlusion identified.   2D Echocardiogram  EF 20-25% with no source of embolus. Systolic function severely reduced.  Carotid Doppler  No evidence of hemodynamically significant internal carotid artery stenosis. Vertebral artery flow is antegrade.   CXR  01/23/2013   Borderline cardiomegaly without focal acute finding.     TEE Severe LV dysfunction, spontaneous contrast in the LV, and PFO with left-to-right shunting  EKG  normal EKG, normal sinus rhythm, nonspecific  T wave changes.   Therapy Recommendations OUTPATIENT  GENERAL EXAM:  Patient is in no distress  CARDIOVASCULAR:  Regular rate and rhythm, no murmurs, no carotid bruits  NEUROLOGIC:  MENTAL STATUS: awake, alert, language fluent, comprehension intact, naming intact  CRANIAL NERVE: no papilledema on fundoscopic exam, pupils equal and  reactive to light, visual fields full to confrontation, extraocular muscles intact, no nystagmus, DECR SENS AND STRENGTH IN LEFT FACE, uvula midline, shoulder shrug symmetric, tongue midline.  MOTOR: normal bulk and tone, full strength in the RUE AND RLE. LUE 4, LLE 4.  SENSORY: DECR IN LUE AND LLE  COORDINATION: NO ATAXIA. SLOW MOVEMENTS IN LUE   ASSESSMENT [name] is a 32 y.o. male presenting with left hemiparesis. Status post IV t-PA 01/24/2013 at 0014. Imaging confirms a small right MCA infarct in the pre motor cortex. Infarct felt to be embolic secondary to likely cardiac source, workup underway.  On no antithrombotic prior to admission. Now on aspirin 325 mg orally every day for secondary stroke prevention. Work up underway.   Idiopathic CHF Dx'd Nathan 2014  Hospital day # 4  TREATMENT/PLAN  Continue aspirin 325 mg orally every day for secondary stroke prevention.  Results of TEE discussed with Dr. Excell Seltzer. Patient will be placed on xarelto long term.  LDL 97, will not place on statin as no atherosclerotic disease and symptoms felt secondary to severe cardiac function  Cardiac MRI as outpatient for ischemic workup.  No sports or rigorous physical activity due to severe decreased heart function.  Follow up with cardiology as arranged.  Follow up with Dr. Pearlean Brownie in 2 months.  Gwendolyn Lima. Manson Passey, [facility], MBA, MHA [facility] Pager: [phone] 01/27/2013 10:28 AM  I have personally obtained a history, examined the patient, evaluated imaging results, and formulated the assessment and plan of care. I agree with the above.  Delia Heady, MD

## 2013-01-27 NOTE — Progress Notes (Signed)
Physical Therapy Treatment Patient Details Name: Nathan Baird MRN: [id number] DOB: 1980-07-10 Today's Date: 01/27/2013 Time: 7829-5621 PT Time Calculation (min): 15 min  PT Assessment / Plan / Recommendation  History of Present Illness 32 year old admitted with L side weakness due to small R MCA infarct affecting premotor cortex.  s/p Tpa.    Clinical Impression Ready for D/C home,  Much improved, but still not at normal for 32 y/o.   PT Comments     Follow Up Recommendations  Outpatient PT;Supervision - Intermittent     Does the patient have the potential to tolerate intense rehabilitation     Barriers to Discharge        Equipment Recommendations  None recommended by PT    Recommendations for Other Services    Frequency Min 4X/week   Progress towards PT Goals Progress towards PT goals: Progressing toward goals  Plan Current plan remains appropriate    Precautions / Restrictions Precautions Precautions: Fall Restrictions Weight Bearing Restrictions: No   Pertinent Vitals/Pain     Mobility  Bed Mobility Bed Mobility: Supine to Sit;Sitting - Scoot to Edge of Bed;Sit to Supine Supine to Sit: 7: Independent Sitting - Scoot to Edge of Bed: 7: Independent Sit to Supine: 7: Independent Transfers Transfers: Sit to Stand;Stand to Sit Sit to Stand: 7: Independent Stand to Sit: 7: Independent Ambulation/Gait Ambulation/Gait Assistance: 4: Min assist Ambulation Distance (Feet): 900 Feet Assistive device: None Ambulation/Gait Assistance Details: steady with high level balance changes, but pt feels that he's having trouble makingquicker decisions Gait Pattern: Step-through pattern;Decreased stride length;Decreased hip/knee flexion - left;Decreased dorsiflexion - left Gait velocity: WFL Stairs: Yes Stairs Assistance: 6: Modified independent (Device/Increase time) Stairs Assistance Details (indicate cue type and reason): front ward and backward up 2 flights turning mid  flight Stair Management Technique: One rail Right;No rails;Alternating pattern;Sideways;Backwards;Forwards Number of Stairs: 26 Wheelchair Mobility Wheelchair Mobility: No Modified Rankin (Stroke Patients Only) Pre-Morbid Rankin Score: No symptoms Modified Rankin: Slight disability    Exercises     PT Diagnosis:    PT Problem List:   PT Treatment Interventions:     PT Goals (current goals can now be found in the care plan section) Acute Rehab PT Goals Potential to Achieve Goals: Good  Visit Information  Last PT Received On: 01/27/13 Assistance Needed: +1 PT/OT Co-Evaluation/Treatment: Yes History of Present Illness: 32 year old admitted with L side weakness due to small R MCA infarct affecting premotor cortex.  s/p Tpa.     Subjective Data  Subjective: I've just been kind of bummed   Cognition  Cognition Arousal/Alertness: Awake/alert Behavior During Therapy: WFL for tasks assessed/performed Overall Cognitive Status: Within Functional Limits for tasks assessed    Balance  Dynamic Gait Index Level Surface: Normal Change in Gait Speed: Normal Gait and Pivot Turn: Normal Step Over Obstacle: Normal Step Around Obstacles: Normal Steps: Normal High Level Balance High Level Balance Activites: Side stepping;Backward walking;Direction changes;Turns;Sudden stops;Head turns;Other (comment) High Level Balance Comments: Few signs of fatigue today.  no over episodes on uncoordinated movement today  End of Session PT - End of Session Equipment Utilized During Treatment: Gait belt Activity Tolerance: Patient tolerated treatment well;Patient limited by fatigue Patient left: in bed;with call bell/phone within reach Nurse Communication: Mobility status   GP     Aladdin Kollmann, Eliseo Gum 01/27/2013, 3:31 PM 01/27/2013  [place], PT [phone]  (pager)

## 2013-01-27 NOTE — Progress Notes (Addendum)
Patient: Nathan Baird / Admit Date: 01/23/2013 / Date of Encounter: 01/27/2013, 7:35 AM   Subjective  Wrote his name for the first time yesterday and it felt funny, as with stairs. No CP, SOB or palpitations.   Objective    TEE 01/26/13 - Left ventricle: The cavity size was dilated. The estimated ejection fraction was 15%. Diffuse hypokinesis. There was mild spontaneous echo contrast, indicative of stasis. - Aortic valve: No evidence of vegetation. - Mitral valve: No evidence of vegetation. Mild regurgitation. - Left atrium: The atrium was mildly dilated. No evidence of thrombus in the atrial cavity or appendage. - Right ventricle: The cavity size was mildly dilated. Systolic function was severely reduced. - Right atrium: The atrium was mildly dilated. - Atrial septum: There was a patent foramen ovale. - Tricuspid valve: No evidence of vegetation.   Telemetry: NSR occ PVCs Physical Exam: Filed Vitals:   01/27/13 0648  BP: 107/66  Pulse: 68  Temp: 97.9 F (36.6 C)  Resp: 20   General: Well developed, well nourished AAM in no acute distress. Head: Normocephalic, atraumatic, sclera non-icteric, no xanthomas, nares are without discharge. Neck: Negative for carotid bruits. JVD not elevated. Lungs: Clear bilaterally to auscultation without wheezes, rales, or rhonchi. Breathing is unlabored. Heart: RRR S1 S2 without murmurs, rubs, or gallops.  Abdomen: Soft, non-tender, non-distended with normoactive bowel sounds. No hepatomegaly. No rebound/guarding. No obvious abdominal masses. Msk:  Strength and tone appear normal for age. Extremities: No clubbing or cyanosis. No edema.  Distal pedal pulses are 2+ and equal bilaterally. Neuro: Alert and oriented X 3. Moves all extremities spontaneously. Psych:  Responds to questions appropriately with a normal affect.    Intake/Output Summary (Last 24 hours) at 01/27/13 0735 Last data filed at 01/26/13 1500  Gross per 24 hour  Intake      50 ml  Output      0 ml  Net     50 ml    Inpatient Medications:  . aspirin EC  325 mg Oral Daily  . carvedilol  6.25 mg Oral BID    Labs:  Recent Labs  01/26/13 0545 01/27/13 0518  NA 138 139  K 3.7 4.0  CL 103 103  CO2 24 24  GLUCOSE 99 100*  BUN 15 16  CREATININE 1.22 1.14  CALCIUM 9.5 9.8   No results found for this basename: AST, ALT, ALKPHOS, BILITOT, PROT, ALBUMIN,  in the last 72 hours No results found for this basename: WBC, NEUTROABS, HGB, HCT, MCV, PLT,  in the last 72 hours No results found for this basename: CKTOTAL, CKMB, TROPONINI,  in the last 72 hours No components found with this basename: POCBNP,  No results found for this basename: HGBA1C,  in the last 72 hours   Radiology/Studies:  Dg Chest 2 View  01/23/2013   *RADIOLOGY REPORT*  Clinical Data: Left arm weakness  CHEST - 2 VIEW  Comparison: 12/04/2012  Findings: Borderline cardiomegaly persists without evidence for edema. Lung volumes are low with crowding of the bronchovascular markings.  No focal pulmonary opacity.  No pleural effusion. Minimal inferior endplate compression at T11 level is stable.  IMPRESSION: Borderline cardiomegaly without focal acute finding.   Original Report Authenticated By: Christiana Pellant, M.D.   Ct Head Wo Contrast  01/23/2013   *RADIOLOGY REPORT*  Clinical Data: Left sided facial numbness and left arm numbness  CT HEAD WITHOUT CONTRAST  Technique:  Contiguous axial images were obtained from the base of the skull  through the vertex without contrast.  Comparison: Head CT, 05/24/2009  Findings: There are no parenchymal masses or mass effect.  There are no areas of abnormal parenchymal attenuation.  There is no evidence of a recent transcortical infarct.  The ventricles are normal in size and configuration.  There are no extra-axial masses or abnormal fluid collections.  There is no intracranial hemorrhage.  The visualized sinuses and mastoid air cells are clear.  IMPRESSION: Normal  unenhanced CT scan of the brain.   Original Report Authenticated By: Amie Portland, M.D.   [name] Contrast  01/24/2013   *RADIOLOGY REPORT*  Clinical Data:  32 year old male 24 hours after TPA treatment for left side numbness and facial droop.  Comparison: Head CTs without contrast 01/23/2013 and earlier.  MRI HEAD WITHOUT CONTRAST  Technique: Multiplanar, multiecho pulse sequences of the brain and surrounding structures were obtained according to standard protocol without intravenous contrast.  Findings: A small mixed nodular and linear area of restricted diffusion occurs in the right posterior frontal lobe gyrus, 1 gyrus anterior to the motor strip, along the superior lateral convexity. The abnormal area encompasses 14 x 13 mm transaxially with decreased signal on ADC.  There is subtle associated T2 and FLAIR hyperintensity, mostly cortical (series 6 and series 7 images 17 and 18).  No associated mass effect or hemorrhage.  Elsewhere normal diffusion, great, and white matter signal throughout the brain. Major intracranial vascular flow voids are preserved.  No midline shift, ventriculomegaly, mass effect, evidence of mass lesion, extra-axial collection or acute intracranial hemorrhage. Cervicomedullary junction and pituitary are within normal limits. Negative visualized cervical spine.  Normal bone marrow signal.  Visualized orbit soft tissues are within normal limits.  Small right maxillary sinus mucous retention cyst.  Other paranasal sinuses and mastoids are clear.  Chronic left lamina papyracea fracture.  Negative scalp soft tissues.  IMPRESSION: 1.  Small acute right MCA infarct effecting pre motor cortex.  No mass effect or hemorrhage. 2.  Otherwise normal noncontrast brain MRI. 3.  MRA findings are below.  MRA HEAD WITHOUT CONTRAST  Technique: Angiographic images of the Circle of Willis were obtained using MRA technique without  intravenous contrast.  Findings: Antegrade flow in the posterior  circulation with dominant distal right vertebral artery, the left functionally terminates in PICA.  Normal right PICA.  No basilar artery stenosis.  SCA and PCA origins are normal.  Diminutive posterior communicating arteries. Bilateral PCA branches are within normal limits.  Antegrade flow in both ICA siphons.  No ICA stenosis.  Normal ophthalmic and posterior communicating artery origins.  Normal carotid termini, MCA and ACA origins.  Anterior communicating artery, visualized ACA branches, and left MCA branches are within normal limits.  Right MCA M1 segment is normal.  Right MCA trifurcation is patent. Visualized right MCA branches are within normal limits.  No focal branch stenosis or major branch occlusion is identified.  IMPRESSION: Negative intracranial MRA.   No right MCA branch irregularity, stenosis, or occlusion identified.   Original Report Authenticated By: Erskine Speed, M.D.   Mr Mra Head/brain Wo Cm  01/24/2013   *RADIOLOGY REPORT*  Clinical Data:  32 year old male 24 hours after TPA treatment for left side numbness and facial droop.  Comparison: Head CTs without contrast 01/23/2013 and earlier.  MRI HEAD WITHOUT CONTRAST  Technique: Multiplanar, multiecho pulse sequences of the brain and surrounding structures were obtained according to standard protocol without intravenous contrast.  Findings: A small mixed nodular and linear area of  restricted diffusion occurs in the right posterior frontal lobe gyrus, 1 gyrus anterior to the motor strip, along the superior lateral convexity. The abnormal area encompasses 14 x 13 mm transaxially with decreased signal on ADC.  There is subtle associated T2 and FLAIR hyperintensity, mostly cortical (series 6 and series 7 images 17 and 18).  No associated mass effect or hemorrhage.  Elsewhere normal diffusion, great, and white matter signal throughout the brain. Major intracranial vascular flow voids are preserved.  No midline shift, ventriculomegaly, mass effect,  evidence of mass lesion, extra-axial collection or acute intracranial hemorrhage. Cervicomedullary junction and pituitary are within normal limits. Negative visualized cervical spine.  Normal bone marrow signal.  Visualized orbit soft tissues are within normal limits.  Small right maxillary sinus mucous retention cyst.  Other paranasal sinuses and mastoids are clear.  Chronic left lamina papyracea fracture.  Negative scalp soft tissues.  IMPRESSION: 1.  Small acute right MCA infarct effecting pre motor cortex.  No mass effect or hemorrhage. 2.  Otherwise normal noncontrast brain MRI. 3.  MRA findings are below.  MRA HEAD WITHOUT CONTRAST  Technique: Angiographic images of the Circle of Willis were obtained using MRA technique without  intravenous contrast.  Findings: Antegrade flow in the posterior circulation with dominant distal right vertebral artery, the left functionally terminates in PICA.  Normal right PICA.  No basilar artery stenosis.  SCA and PCA origins are normal.  Diminutive posterior communicating arteries. Bilateral PCA branches are within normal limits.  Antegrade flow in both ICA siphons.  No ICA stenosis.  Normal ophthalmic and posterior communicating artery origins.  Normal carotid termini, MCA and ACA origins.  Anterior communicating artery, visualized ACA branches, and left MCA branches are within normal limits.  Right MCA M1 segment is normal.  Right MCA trifurcation is patent. Visualized right MCA branches are within normal limits.  No focal branch stenosis or major branch occlusion is identified.  IMPRESSION: Negative intracranial MRA.   No right MCA branch irregularity, stenosis, or occlusion identified.   Original Report Authenticated By: Erskine Speed, M.D.     Assessment and Plan  1. Acute CVA - traditional CV risk factors are absent. He has known LV dysfunction (not fully evaluated yet) also with PFO identified on TEE this admission; spontaneous contrast seen. 2. Known LV dysfunction,  RV dysfunction also present - not yet clear if ICM vs NICM although no ischemic-type symptoms at home (plays basketball 3-4x/week). Tolerating Coreg at home dose, but BP remains chronically on lower side prohibiting addition of ACEI. (H/o severe orthostasis when placed on Lasix in the past.) 18g IV access unable to be obtained yesterday so cardiac CT cancelled in favor of outpatient study by Dr. Excell Seltzer. Will discuss further recs for this with him including consideration for referral to EP given persistent LV dysfunction. 3. PFO by TEE 01/26/13 - will discuss mgmt with Dr. Excell Seltzer.   4. Palpitations - 2x/mo, lasting seconds at at time. Could represent SVT vs NSVT vs afib. Consider 30 day outpt event monitor. If unrevealing, could proceed with loop. 4. Chronic cough with restrictive defect on PFTs - pt feels this has worsened over the last several months. I instructed him to f/u with Dr. Sherene Sires as previously planned particularly given that mother has sarcoidosis. 5. Renal insufficiency - improved.  Signed, Ronie Spies PA-C  Patient seen, examined. Available data reviewed. Agree with findings, assessment, and plan as outlined by Ronie Spies, PA-C. Exam reveals pleasant, alert and oriented male in NAD.  Lungs clear, heart RRR without murmur, no peripheral edema. TEE findings notable for very severe LV dysfunction with spontaneous contrast and PFO with L-R shunting. Several issues discussed at length with patient and his girlfriend:  With findings noted above, would favor anticoagulation over antiplatelet Rx if stroke team is in agreement. I reviewed pros and cons of warfarin versus NOAC's and would favor a novel drug in this young patient, i.e. Xarelto 20 mg daily. Obviously should stop ASA if placed on Xarelto. Will ask Case Manager to evaluate cost.  Will arrange outpatient cardiac MRI to assess etiology of cardiomyopathy, but highly suspect nonischemic dilated CM  Will add low-dose ARB for treatment of  cardiomyopathy, losartan 25 mg daily. No ACE because cough is already an issue for him  Lipids reviewed and LDL less than 100 mg/dL. With acute stroke I suspect low-dose statin indicated. Will defer to stroke team, but would be happy to follow lipids/LFT's as an outpatient  Will ultimately need EP evaluation for primary prevention of sudden cardiac death. Will defer timing of this to Dr Elease Hashimoto when he sees the patient back in follow-up.  Advised patient he cannot participate in vigorous physical activity or competitive sports at this time because of his severe cardiomyopathy.  Please call if any questions. I will arrange cardiac MRI and outpatient follow-up visit.  Tonny Bollman, M.D. 01/27/2013 10:02 AM

## 2013-01-28 ENCOUNTER — Telehealth: Payer: Self-pay | Admitting: *Deleted

## 2013-01-28 DIAGNOSIS — I6350 Cerebral infarction due to unspecified occlusion or stenosis of unspecified cerebral artery: Secondary | ICD-10-CM

## 2013-01-28 NOTE — Telephone Encounter (Signed)
Angie called for pt re: needing order for PT.  DONE.

## 2013-01-29 NOTE — Addendum Note (Signed)
Addended byHermenia Fiscal on: 01/29/2013 03:47 PM   Modules accepted: Orders

## 2013-01-29 NOTE — Discharge Summary (Addendum)
Stroke Discharge Summary  Patient ID: Nathan Baird    l   MRN: [id number]      DOB: 04/13/1981  Date of Admission: 01/23/2013 Date of Discharge: 01/27/2013  Attending Physician:  Pramodkumar Aolanis Crispen, MD, Stroke MD  Consulting Physician(s):   Treatment Team:  [facility], MD   Patient's PCP:  No PCP Per Patient  Discharge Diagnoses:  Principal Problem:   Stroke, acute, embolic Active Problems:   Congestive dilated cardiomyopathy   Encounter for long-term (current) use of high-risk medication   Patent foramen ovale with right to left shunt  BMI: Body mass index is 30.31 kg/(m^2).  Past Medical History  Diagnosis Date  . Cough     a. PFTS 09/2012: restriction probable, further examinations recommended. Saw pulm 11/2012: placed on GERD rx and short course prednisone, planned to regroup.  . LV dysfunction     a. Identified 10/2012 by echo EF 20-25%. b. Echo 01/2013: EF 20-25%, TEE EF 15% with severe RV dysfunction as well.  . History of palpitations     2x/month for several years feels heart racing  . CVA (cerebral vascular accident) 01/23/2013    tPA administered; MRI brain showed small acute R MCA infarct  . PFO (patent foramen ovale)     a. By TEE 01/2013.   Past Surgical History  Procedure Laterality Date  . No past surgeries    . Tee without cardioversion N/A 01/26/2013    Procedure: TRANSESOPHAGEAL ECHOCARDIOGRAM (TEE);  Surgeon: Brian S Crenshaw, MD;  Location: [facility];  Service: Cardiovascular;  Laterality: N/A;      Medication List         carvedilol 6.25 MG tablet  Commonly known as:  COREG  Take 1 tablet (6.25 mg total) by mouth 2 (two) times daily.     losartan 25 MG tablet  Commonly known as:  COZAAR  Take 1 tablet (25 mg total) by mouth daily.     Rivaroxaban 20 MG Tabs  Commonly known as:  XARELTO  Take 1 tablet (20 mg total) by mouth daily.        LABORATORY STUDIES CBC    Component Value Date/Time   WBC 5.6 01/23/2013 2234   WBC 7.2  05/28/2012 1745   RBC 5.00 01/23/2013 2234   RBC 4.63* 05/28/2012 1745   HGB 15.6 01/23/2013 2244   HGB 13.5* 05/28/2012 1745   HCT 46.0 01/23/2013 2244   HCT 42.8* 05/28/2012 1745   PLT 261 01/23/2013 2234   MCV 86.8 01/23/2013 2234   MCV 92.5 05/28/2012 1745   MCH 28.8 01/23/2013 2234   MCH 29.2 05/28/2012 1745   MCHC 33.2 01/23/2013 2234   MCHC 31.5* 05/28/2012 1745   RDW 12.5 01/23/2013 2234   LYMPHSABS 3.1 01/23/2013 2234   MONOABS 0.4 01/23/2013 2234   EOSABS 0.2 01/23/2013 2234   BASOSABS 0.0 01/23/2013 2234   CMP    Component Value Date/Time   NA 139 01/27/2013 0518   K 4.0 01/27/2013 0518   CL 103 01/27/2013 0518   CO2 24 01/27/2013 0518   GLUCOSE 100* 01/27/2013 0518   BUN 16 01/27/2013 0518   CREATININE 1.14 01/27/2013 0518   CALCIUM 9.8 01/27/2013 0518   PROT 7.0 01/23/2013 2234   ALBUMIN 3.9 01/23/2013 2234   AST 31 01/23/2013 2234   ALT 25 01/23/2013 2234   ALKPHOS 65 01/23/2013 2234   BILITOT 0.4 01/23/2013 2234   GFRNONAA 84* 01/27/2013 0518   GFRAA >90 01/27/2013 0518   COAGS   Lab Results  Component Value Date   INR 1.08 01/23/2013   Lipid Panel    Component Value Date/Time   CHOL 167 01/24/2013 0400   TRIG 55 01/24/2013 0400   HDL 59 01/24/2013 0400   CHOLHDL 2.8 01/24/2013 0400   VLDL 11 01/24/2013 0400   LDLCALC 97 01/24/2013 0400   HgbA1C  Lab Results  Component Value Date   HGBA1C 5.6 01/24/2013   Cardiac Panel (last 3 results) No results found for this basename: CKTOTAL, CKMB, TROPONINI, RELINDX,  in the last 72 hours Urinalysis    Component Value Date/Time   COLORURINE YELLOW 12/16/2012 1925   APPEARANCEUR CLEAR 12/16/2012 1925   LABSPEC 1.035* 12/16/2012 1925   PHURINE 5.5 12/16/2012 1925   GLUCOSEU NEGATIVE 12/16/2012 1925   HGBUR NEGATIVE 12/16/2012 1925   BILIRUBINUR SMALL* 12/16/2012 1925   KETONESUR 15* 12/16/2012 1925   PROTEINUR NEGATIVE 12/16/2012 1925   UROBILINOGEN 1.0 12/16/2012 1925   NITRITE NEGATIVE 12/16/2012 1925   LEUKOCYTESUR NEGATIVE 12/16/2012  1925   Urine Drug Screen  No results found for this basename: labopia,  cocainscrnur,  labbenz,  amphetmu,  thcu,  labbarb    Alcohol Level No results found for this basename: eth     SIGNIFICANT DIAGNOSTIC STUDIES Urine Drug Screen:  No results found for this basename: labopia, cocainscrnur, labbenz, amphetmu, thcu, labbarb    Alcohol Level: No results found for this basename: ETH, in the last 168 hours  CT of the brain 01/23/2013 Normal unenhanced CT scan of the brain.  MRI of the brain 01/24/2013 1. Small acute right MCA infarct effecting pre motor cortex. No mass effect or hemorrhage. 2. Otherwise normal noncontrast brain MRI.  MRA of the brain 01/24/2013 Negative intracranial MRA. No right MCA branch irregularity, stenosis, or occlusion identified.  2D Echocardiogram EF 20-25% with no source of embolus. Systolic function severely reduced.  Carotid Doppler No evidence of hemodynamically significant internal carotid artery stenosis. Vertebral artery flow is antegrade.  CXR 01/23/2013 Borderline cardiomegaly without focal acute finding.  TEE Severe LV dysfunction, spontaneous contrast in the LV, and PFO with left-to-right shunting  EKG normal EKG, normal sinus rhythm, nonspecific T wave changes.  Therapy Recommendations OUTPATIENT      History of Present Illness    32 y.o. male who was at work 01/24/2013 when he began to feel "weird" on the left side. Symptoms were mostly in his left face and arm. EMS was called and the patient was brought in for evaluation. While in the ED symptoms worsened with weakness noted on the left side including the lower extremity and left facial droop. Code stroke was called at that time. Post tpa administration, He was admitted to the neuro ICU for further evaluation and treatment   Hospital Course   Nathan Baird is a 32 y.o. male presenting with left hemiparesis. Status post IV t-PA 01/24/2013 at 0014. Imaging confirmed a small right MCA infarct in  the pre motor cortex. Infarct felt to be embolic secondary to likely cardiac source, workup underway. On no antithrombotic prior to admission. Now on aspirin 325 mg orally every day for secondary stroke prevention. Idiopathic CHF Dx'd April 2014. Placed on Coreg. Now ARB added. Ischemic workup as outpatient. Results of TEE discussed with Dr. Cooper. Patient will be placed on xarelto long term.  LDL 97, will not place on statin as no atherosclerotic disease as symptoms felt secondary to severe cardiac function  Cardiac MRI as outpatient for ischemic workup.  No   sports or rigorous physical activity due to severe decreased heart function.  Follow up with cardiology as arranged. Follow up with Dr. Mairany Bruno in 2 months   Patient with continued stroke symptoms of left hemiparesis. Physical therapy, occupational therapy and speech therapy evaluated patient. They recommend outpatient therapy  Discharge Exam  Blood pressure 109/73, pulse 76, temperature 98.5 F (36.9 C), temperature source Oral, resp. rate 20, height 6' 3" (1.905 m), weight 110 kg (242 lb 8.1 oz), SpO2 100.00%.  GENERAL EXAM:  Patient is in no distress  CARDIOVASCULAR:  Regular rate and rhythm, no murmurs, no carotid bruits  NEUROLOGIC:  MENTAL STATUS: awake, alert, language fluent, comprehension intact, naming intact  CRANIAL NERVE: no papilledema on fundoscopic exam, pupils equal and reactive to light, visual fields full to confrontation, extraocular muscles intact, no nystagmus, DECR SENS AND STRENGTH IN LEFT FACE, uvula midline, shoulder shrug symmetric, tongue midline.  MOTOR: normal bulk and tone, full strength in the RUE AND RLE. LUE 4, LLE 4.  SENSORY: DECR IN LUE AND LLE  COORDINATION: NO ATAXIA. SLOW MOVEMENTS IN LUE   Discharge Diet   Cardiac thin liquids  Discharge Plan    Disposition:  home   xarelto 20mg daily for secondary stroke prevention.  Ongoing risk factor control by Primary Care Physician. Risk factor  recommendations:  Hypertension target range 130-140/70-80 Lipid range - LDL < 100 and checked every 6 months, fasting   Follow-up with primary MD in 1 month.  Follow-up with Dr. Juli Odom, Stroke Clinic for TCD Bubble and emboli.  Follow-up with Cardiology as they have set up.  35 minutes were spent preparing discharge.  Signed  Lynn D. Brown, PAC, MBA, MHA [facility] Pager: [phone] 01/27/2013 1:19 PM  I have personally examined this patient, reviewed pertinent data and developed the plan of care. I agree with above. Hayzel Ruberg, MD  

## 2013-02-01 ENCOUNTER — Ambulatory Visit: Payer: 59 | Attending: Neurology | Admitting: Physical Therapy

## 2013-02-01 DIAGNOSIS — R5381 Other malaise: Secondary | ICD-10-CM | POA: Insufficient documentation

## 2013-02-01 DIAGNOSIS — IMO0001 Reserved for inherently not codable concepts without codable children: Secondary | ICD-10-CM | POA: Insufficient documentation

## 2013-02-01 DIAGNOSIS — R42 Dizziness and giddiness: Secondary | ICD-10-CM | POA: Insufficient documentation

## 2013-02-03 ENCOUNTER — Other Ambulatory Visit (INDEPENDENT_AMBULATORY_CARE_PROVIDER_SITE_OTHER): Payer: 59

## 2013-02-03 ENCOUNTER — Telehealth: Payer: Self-pay | Admitting: Cardiovascular Disease

## 2013-02-03 ENCOUNTER — Telehealth: Payer: Self-pay | Admitting: Neurology

## 2013-02-03 DIAGNOSIS — I428 Other cardiomyopathies: Secondary | ICD-10-CM

## 2013-02-03 DIAGNOSIS — I429 Cardiomyopathy, unspecified: Secondary | ICD-10-CM

## 2013-02-03 LAB — BASIC METABOLIC PANEL
CO2: 28 mEq/L (ref 19–32)
Calcium: 9.9 mg/dL (ref 8.4–10.5)
Chloride: 104 mEq/L (ref 96–112)
Potassium: 4 mEq/L (ref 3.5–5.1)
Sodium: 140 mEq/L (ref 135–145)

## 2013-02-03 NOTE — Telephone Encounter (Signed)
This is a Dr Elease Hashimoto patient.

## 2013-02-03 NOTE — Telephone Encounter (Signed)
Pt was called and told to call Dr Marlis Edelson office/ number provided and see when he can return to work. Pt had a stroke and was seen in ED/ admit. Told him to call with further questions.

## 2013-02-03 NOTE — Telephone Encounter (Signed)
Pt would like a work note for work please call pt for specific dates.

## 2013-02-03 NOTE — Telephone Encounter (Signed)
I called pt and he relayed needed note for work.  He stated that he works for [employer] and was admitted for stroke on 01-23-13.  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  He will bring to tomorrow.

## 2013-02-03 NOTE — Telephone Encounter (Signed)
Left msg call back.

## 2013-02-04 ENCOUNTER — Other Ambulatory Visit: Payer: Self-pay | Admitting: Neurology

## 2013-02-04 DIAGNOSIS — I6350 Cerebral infarction due to unspecified occlusion or stenosis of unspecified cerebral artery: Secondary | ICD-10-CM

## 2013-02-08 ENCOUNTER — Ambulatory Visit: Payer: 59 | Attending: Neurology | Admitting: Occupational Therapy

## 2013-02-08 ENCOUNTER — Ambulatory Visit (HOSPITAL_COMMUNITY)
Admission: RE | Admit: 2013-02-08 | Discharge: 2013-02-08 | Disposition: A | Discharge status: Home / Self Care | Payer: 59 | Source: Ambulatory Visit | Attending: Cardiovascular Disease | Admitting: Cardiovascular Disease

## 2013-02-08 ENCOUNTER — Encounter: Payer: Self-pay | Admitting: *Deleted

## 2013-02-08 DIAGNOSIS — IMO0001 Reserved for inherently not codable concepts without codable children: Secondary | ICD-10-CM | POA: Insufficient documentation

## 2013-02-08 DIAGNOSIS — I429 Cardiomyopathy, unspecified: Secondary | ICD-10-CM

## 2013-02-08 DIAGNOSIS — R5381 Other malaise: Secondary | ICD-10-CM | POA: Insufficient documentation

## 2013-02-08 DIAGNOSIS — I428 Other cardiomyopathies: Secondary | ICD-10-CM | POA: Insufficient documentation

## 2013-02-08 DIAGNOSIS — R42 Dizziness and giddiness: Secondary | ICD-10-CM | POA: Insufficient documentation

## 2013-02-08 MED ORDER — GADOBENATE DIMEGLUMINE 529 MG/ML IV SOLN
35.0000 mL | Freq: Once | INTRAVENOUS | Status: AC | PRN
  Administered 2013-02-08: 35 mL via INTRAVENOUS

## 2013-02-09 ENCOUNTER — Ambulatory Visit (INDEPENDENT_AMBULATORY_CARE_PROVIDER_SITE_OTHER): Payer: 59 | Admitting: Emergency Medicine

## 2013-02-09 VITALS — BP 110/78 | HR 79 | Temp 97.9°F | Resp 18 | Ht 74.5 in | Wt 241.0 lb

## 2013-02-09 DIAGNOSIS — Z2089 Contact with and (suspected) exposure to other communicable diseases: Secondary | ICD-10-CM

## 2013-02-09 DIAGNOSIS — Z202 Contact with and (suspected) exposure to infections with a predominantly sexual mode of transmission: Secondary | ICD-10-CM

## 2013-02-09 NOTE — Progress Notes (Signed)
[facility] [address], [place] [address] [phone]  Date:  02/09/2013   Name:  Nathan Baird   DOB:  12-07-1980   MRN:  [id number]  PCP:  No primary provider on file.    Chief Complaint: Abdominal Pain   History of Present Illness:  Nathan Baird is a 32 y.o. very pleasant male patient who presents with the following:  Had unprotected sexual encounter with a woman who texted him that she was treated for an STD.  He doesn't know the specifics. He is asymptomatic.  Has no discharge or dysuria.  Denies other complaint or health concern today.   Patient Active Problem List   Diagnosis Date Noted  . Encounter for long-term (current) use of high-risk medication 01/27/2013  . Patent foramen ovale with right to left shunt 01/27/2013  . Congestive dilated cardiomyopathy 01/25/2013  . Stroke, acute, embolic 01/25/2013  . Cough 10/20/2012  . Chronic systolic congestive heart failure 10/20/2012  . Penile pain 05/19/2012  . ATTENTION DEFICIT, W/HYPERACTIVITY 09/04/2006    Past Medical History  Diagnosis Date  . Cough     a. PFTS 09/2012: restriction probable, further examinations recommended. Saw pulm 11/2012: placed on GERD rx and short course prednisone, planned to regroup.  . LV dysfunction     a. Identified 10/2012 by echo EF 20-25%. b. Echo 01/2013: EF 20-25%, TEE EF 15% with severe RV dysfunction as well.  [name] History of palpitations     2x/month for several years feels heart racing  . CVA (cerebral vascular accident) 01/23/2013    tPA administered; MRI brain showed small acute R MCA infarct  . PFO (patent foramen ovale)     a. By TEE 01/2013.  [name] CHF (congestive heart failure)     Past Surgical History  Procedure Laterality Date  . No past surgeries    . Tee without cardioversion N/A 01/26/2013    Procedure: TRANSESOPHAGEAL ECHOCARDIOGRAM (TEE);  Surgeon: [name], MD;  Location: [facility] ENDOSCOPY;  Service: Cardiovascular;  Laterality: N/A;     History  Substance Use Topics  . Smoking status: Never Smoker   . Smokeless tobacco: Not on file  . Alcohol Use: No    Family History  Problem Relation Age of Onset  . Sarcoidosis Mother   . Heart Problems Mother     PPM implantation age 44    No Known Allergies  Medication list has been reviewed and updated.  Current Outpatient Prescriptions on File Prior to Visit  Medication Sig Dispense Refill  . carvedilol (COREG) 6.25 MG tablet Take 1 tablet (6.25 mg total) by mouth 2 (two) times daily.  60 tablet  5  . losartan (COZAAR) 25 MG tablet Take 1 tablet (25 mg total) by mouth daily.  30 tablet  2  . Rivaroxaban (XARELTO) 20 MG TABS Take 1 tablet (20 mg total) by mouth daily.  30 tablet  2   No current facility-administered medications on file prior to visit.    Review of Systems:  As per HPI, otherwise negative.    Physical Examination: Filed Vitals:   02/09/13 1148  BP: 110/78  Pulse: 79  Temp: 97.9 F (36.6 C)  Resp: 18   Filed Vitals:   02/09/13 1148  Height: 6' 2.5" (1.892 m)  Weight: 241 lb (109.317 kg)   Body mass index is 30.54 kg/(m^2). Ideal Body Weight: Weight in (lb) to have BMI = 25: 196.9   GEN: WDWN, NAD, Non-toxic, Alert &  Oriented x 3 HEENT: Atraumatic, Normocephalic.  Ears and Nose: No external deformity. EXTR: No clubbing/cyanosis/edema NEURO: Normal gait.  PSYCH: Normally interactive. Conversant. Not depressed or anxious appearing.  Calm demeanor.  GENITALIA:  Normal male  Assessment and Plan: STD exposure Follow up based on labs   Signed,  Phillips Odor, MD

## 2013-02-10 LAB — HSV(HERPES SIMPLEX VRS) I + II AB-IGG: HSV 2 Glycoprotein G Ab, IgG: 0.12 IV

## 2013-02-10 LAB — RPR

## 2013-02-10 LAB — HIV ANTIBODY (ROUTINE TESTING W REFLEX): HIV: NONREACTIVE

## 2013-02-10 LAB — GC/CHLAMYDIA PROBE AMP: GC Probe RNA: NEGATIVE

## 2013-02-12 DIAGNOSIS — Z0289 Encounter for other administrative examinations: Secondary | ICD-10-CM

## 2013-02-15 ENCOUNTER — Telehealth: Payer: Self-pay | Admitting: *Deleted

## 2013-02-15 ENCOUNTER — Telehealth: Payer: Self-pay | Admitting: Neurology

## 2013-02-15 NOTE — Telephone Encounter (Signed)
Message copied by Antony Odea on Mon Feb 15, 2013  5:43 PM ------      Message from: [name]      Created: Sat Feb 13, 2013  7:10 AM       No thrombus seen.  EF 26%.  Continue current meds. ------

## 2013-02-16 ENCOUNTER — Ambulatory Visit (INDEPENDENT_AMBULATORY_CARE_PROVIDER_SITE_OTHER): Payer: 59 | Admitting: Nurse Practitioner

## 2013-02-16 ENCOUNTER — Encounter: Payer: Self-pay | Admitting: Nurse Practitioner

## 2013-02-16 VITALS — BP 110/76 | HR 72 | Ht 75.0 in | Wt 239.1 lb

## 2013-02-16 DIAGNOSIS — I5022 Chronic systolic (congestive) heart failure: Secondary | ICD-10-CM

## 2013-02-16 MED ORDER — CARVEDILOL 6.25 MG PO TABS: 6.2500 mg | ORAL_TABLET | Freq: Two times a day (BID) | ORAL | Status: DC

## 2013-02-16 NOTE — Patient Instructions (Addendum)
Increase the Coreg to TWO times a day  Really avoid salt  Weigh each morning  See Dr. Elease Hashimoto in 2 to 3 weeks  We need to check lab today to follow up the Xarelto  Call the [facility] office at [phone] if you have any questions, problems or concerns.

## 2013-02-16 NOTE — Telephone Encounter (Signed)
I returned patient's call regarding status of medical forms. I received them on February 12, 2013. There is a two week turn around time. I do expect to get it completed and faxed by this Friday, pending physician signature. I will call patient when I have faxed document.

## 2013-02-16 NOTE — Progress Notes (Signed)
Nathan Baird Date of Birth: [birth year] Medical Record #[id number]  History of Present Illness: Nathan Baird is seen back today for a follow up visit. Seen for Dr. Elease Hashimoto. He has systolic heart failure with EF 20 to 25%. TEE confirmed - EF was 15% with severe RV dysfunction along with PFO. Other issues include restrictive lung disease.   Seen here back in May. Had stroke in July. Now on Xarelto.  Has had cardiac MRI. Results as noted below.   Comes back today. Here with his girlfriend. He is doing ok. No chest pain. Not short of breath. Not much endurance and gets dizzy/lightheaded if he does too much. No swelling. Weight is down 2 pounds. Tolerating his medicines but only taking the Coreg once a day. No bleeding issues. No apparent residual from his stroke.   Current Outpatient Prescriptions  Medication Sig Dispense Refill  . carvedilol (COREG) 6.25 MG tablet Take 1 tablet (6.25 mg total) by mouth 2 (two) times daily.  60 tablet  5  . losartan (COZAAR) 25 MG tablet Take 1 tablet (25 mg total) by mouth daily.  30 tablet  2  . Rivaroxaban (XARELTO) 20 MG TABS Take 1 tablet (20 mg total) by mouth daily.  30 tablet  2   No current facility-administered medications for this visit.    No Known Allergies  Past Medical History  Diagnosis Date  . Cough     a. PFTS 09/2012: restriction probable, further examinations recommended. Saw pulm 11/2012: placed on GERD rx and short course prednisone, planned to regroup.  . LV dysfunction     a. Identified 10/2012 by echo EF 20-25%. b. Echo 01/2013: EF 20-25%, TEE EF 15% with severe RV dysfunction as well.  Nathan Baird History of palpitations     2x/month for several years feels heart racing  . CVA (cerebral vascular accident) 01/23/2013    tPA administered; MRI brain showed small acute R MCA infarct  . PFO (patent foramen ovale)     a. By TEE 01/2013.  Nathan Baird CHF (congestive heart failure)     Past Surgical History  Procedure Laterality Date  . No past surgeries    .  Tee without cardioversion N/A 01/26/2013    Procedure: TRANSESOPHAGEAL ECHOCARDIOGRAM (TEE);  Surgeon: [name], MD;  Location: [facility] ENDOSCOPY;  Service: Cardiovascular;  Laterality: N/A;    History  Smoking status  . Never Smoker   Smokeless tobacco  . Not on file    History  Alcohol Use No    Family History  Problem Relation Age of Onset  . Sarcoidosis Mother   . Heart Problems Mother     PPM implantation age 44    Review of Systems: The review of systems is per the HPI.  All other systems were reviewed and are negative.  Physical Exam: BP 110/76  Pulse 72  Ht 6\' 3"  (1.905 m)  Wt 239 lb 1.9 oz (108.464 kg)  BMI 29.89 kg/m2 Patient is very pleasant and in no acute distress. Skin is warm and dry. Color is normal.  HEENT is unremarkable. Normocephalic/atraumatic. PERRL. Sclera are nonicteric. Neck is supple. No masses. No JVD. Lungs are clear. Cardiac exam shows a regular rate and rhythm. Abdomen is soft. Extremities are without edema. Gait and ROM are intact. No gross neurologic deficits noted.  LABORATORY DATA:  Lab Results  Component Value Date   WBC 5.6 01/23/2013   HGB 15.6 01/23/2013   HCT 46.0 01/23/2013   PLT 261 01/23/2013  GLUCOSE 79 02/03/2013   CHOL 167 01/24/2013   TRIG 55 01/24/2013   HDL 59 01/24/2013   LDLCALC 97 01/24/2013   ALT 25 01/23/2013   AST 31 01/23/2013   NA 140 02/03/2013   K 4.0 02/03/2013   CL 104 02/03/2013   CREATININE 1.3 02/03/2013   BUN 14 02/03/2013   CO2 28 02/03/2013   INR 1.08 01/23/2013   HGBA1C 5.6 01/24/2013   Nathan Baird Contrast  01/24/2013   * IMPRESSION: 1.  Small acute right MCA infarct effecting pre motor cortex.  No mass effect or hemorrhage. 2.  Otherwise normal noncontrast brain MRI. 3.  MRA findings are below.    MRA HEAD WITHOUT CONTRAST  IMPRESSION: Negative intracranial MRA.   No right MCA branch irregularity, stenosis, or occlusion identified.   Original Report Authenticated By: Erskine Speed, M.D.   Nathan Baird  Morphology Baird/w Cm  02/08/2013   Cardiac MRI:  Indication:  TIA/ Cardiomyopathy  Protocol:  The patient was scanned on a 1.5 Tesla GE magnet.  A dedicated cardiac coil was used.  Functional imaging was done using Fiesta sequences.  2,3 and 4 chamber views were done to assess RWMA;s.  Quantitative  EF was calculated using Circle software on a dedicated work station.  The patient received 30cc of Multihance. After 10 minutes inversion recovery sequences were done to assess for infarct or scar  Findings:  There was severe LVE with no LVH.  There was diffuse hypokinesis.  The quantitative EF was 26% ( EDV 301 ESV 223 SV 78 ) Delayed enhancement images showed no infarct or scar. There appeared to be ventricular non compaction. There was no mural apical thrombus. There was Mild LAE.  There was no LAA thrombus. The RV was mildly dilated  There was no ASD but PFO flow was apparent.  The RA was normal.  The AV was trileaflet and normal The mitral valve was normal.  The tricuspid valve was normal The ascending aorta was normal with no evidence of dissection.  Impression:     1)    Severe LVE with diffuse hypokinesis EF 26%        2)    Findings consistent with ventricular non-compaction 3)    No infiltration or scar in LV myocardium 4)    Mild RV enlargement 5)    PFO 6)    No LAA thrombus 7)    Mild LAE  Nathan Haws MD [facility]   Original Report Authenticated By: Nathan Baird, M.D.   Assessment / Plan: 1. Systolic heart failure - EF 26% per cardiac MRI - will try to get his medicines titrated up to target doses. Don't have much BP to work with and he has had issues with orthostasis in the past. Will try to get him on the Coreg BID for now. See Dr. Elease Hashimoto in 2 to 3 weeks. Will defer timing of a repeat echo to Dr. Elease Hashimoto. Have reviewed in detail the need for salt restriction. Paper handouts are given to him as well today. We have discussed possible ICD implant as well as approached the subject of possible heart transplant.    2. Recent right MCA infarct - on Xarelto - noted to have PFO flow on his cardiac MRI - for further studies by Neurology later this month.   Patient is agreeable to this plan and will call if any problems develop in the interim.   Rosalio Macadamia, RN, ANP-C [facility] [address]  [address] [place], [place]  [address]

## 2013-02-17 ENCOUNTER — Telehealth: Payer: Self-pay | Admitting: *Deleted

## 2013-02-17 LAB — BASIC METABOLIC PANEL
BUN: 14 mg/dL (ref 6–23)
CO2: 31 mEq/L (ref 19–32)
Calcium: 10 mg/dL (ref 8.4–10.5)
Chloride: 104 mEq/L (ref 96–112)
Creatinine, Ser: 1.2 mg/dL (ref 0.4–1.5)
GFR: 90.58 mL/min (ref >60.00)
Glucose, Bld: 76 mg/dL (ref 70–99)
Potassium: 4 mEq/L (ref 3.5–5.1)
Sodium: 139 mEq/L (ref 135–145)

## 2013-02-17 LAB — CBC WITH DIFFERENTIAL/PLATELET
Basophils Absolute: 0 10*3/uL (ref 0.0–0.1)
Basophils Relative: 0.6 % (ref 0.0–3.0)
Eosinophils Absolute: 0.2 10*3/uL (ref 0.0–0.7)
Eosinophils Relative: 2.1 % (ref 0.0–5.0)
HCT: 47.5 % (ref 39.0–52.0)
Hemoglobin: 16 g/dL (ref 13.0–17.0)
Lymphocytes Relative: 43.1 % (ref 12.0–46.0)
Lymphs Abs: 3.2 10*3/uL (ref 0.7–4.0)
MCHC: 33.7 g/dL (ref 30.0–36.0)
MCV: 89.3 fl (ref 78.0–100.0)
Monocytes Absolute: 0.6 10*3/uL (ref 0.1–1.0)
Monocytes Relative: 8.1 % (ref 3.0–12.0)
Neutro Abs: 3.4 10*3/uL (ref 1.4–7.7)
Neutrophils Relative %: 46.1 % (ref 43.0–77.0)
Platelets: 315 10*3/uL (ref 150.0–400.0)
RBC: 5.32 Mil/uL (ref 4.22–5.81)
RDW: 12.5 % (ref 11.5–14.6)
WBC: 7.3 10*3/uL (ref 4.5–10.5)

## 2013-02-17 NOTE — Telephone Encounter (Signed)
ERROR

## 2013-02-17 NOTE — Telephone Encounter (Deleted)
ERROR

## 2013-02-23 ENCOUNTER — Telehealth: Payer: Self-pay | Admitting: *Deleted

## 2013-02-23 NOTE — Telephone Encounter (Signed)
Per Dr Nahser/ due to low EF, needs app with EP, staff msg sent, pt was made aware and agreed to plan.

## 2013-02-25 ENCOUNTER — Encounter: Payer: Self-pay | Admitting: Internal Medicine

## 2013-02-25 ENCOUNTER — Ambulatory Visit (INDEPENDENT_AMBULATORY_CARE_PROVIDER_SITE_OTHER): Payer: 59 | Admitting: Internal Medicine

## 2013-02-25 VITALS — BP 119/90 | HR 78 | Ht 75.0 in | Wt 237.0 lb

## 2013-02-25 DIAGNOSIS — I639 Cerebral infarction, unspecified: Secondary | ICD-10-CM

## 2013-02-25 DIAGNOSIS — I6340 Cerebral infarction due to embolism of unspecified cerebral artery: Secondary | ICD-10-CM

## 2013-02-25 DIAGNOSIS — I509 Heart failure, unspecified: Secondary | ICD-10-CM

## 2013-02-25 DIAGNOSIS — I5022 Chronic systolic (congestive) heart failure: Secondary | ICD-10-CM

## 2013-02-25 MED ORDER — CARVEDILOL 6.25 MG PO TABS: ORAL_TABLET | ORAL | Status: DC

## 2013-02-25 NOTE — Patient Instructions (Addendum)
Your physician wants you to follow-up in: 6 months with Dr Court Joy will receive a reminder letter in the mail two months in advance. If you don't receive a letter, please call our office to schedule the follow-up appointment.  Your physician has requested that you have an echocardiogram. Echocardiography is a painless test that uses sound waves to create images of your heart. It provides your doctor with information about the size and shape of your heart and how well your heart's chambers and valves are working. This procedure takes approximately one hour. There are no restrictions for this procedure. 4-5 months prior to office visit  Your physician has recommended you make the following change in your medication:  1) Increase Carvedilol to 6.25mg   1 1/2 tablets twice daily

## 2013-02-25 NOTE — Assessment & Plan Note (Signed)
The patient has little if any residual deficits. He will continue his anticoagulation.

## 2013-02-25 NOTE — Progress Notes (Signed)
HPI Nathan Baird is referred today for consideration for prophylactic ICD implantation. The patient is a 32 year old man whose health is been quite good until several months ago when he developed cough, and was ultimately diagnosed with a nonischemic cardiomyopathy. Subsequent evaluation has shown biventricular cardiac enlargement, an ejection fraction of 25%, and his etiology of his cardiomyopathy is thought to be idiopathic. He has very minimal heart failure, currently class 1-2. He exercises regularly, and continues to work. He has not had syncope, and denies peripheral edema. There is no family history of sudden cardiac death or cardiomyopathy. He does not have palpitations. He has had problems with medical noncompliance. No Known Allergies   Current Outpatient Prescriptions  Medication Sig Dispense Refill  . carvedilol (COREG) 6.25 MG tablet Take 1 1/2 tablets by mouth twice daily  90 tablet  6  . losartan (COZAAR) 25 MG tablet Take 1 tablet (25 mg total) by mouth daily.  30 tablet  2  . Rivaroxaban (XARELTO) 20 MG TABS Take 1 tablet (20 mg total) by mouth daily.  30 tablet  2   No current facility-administered medications for this visit.     Past Medical History  Diagnosis Date  . Cough     a. PFTS 09/2012: restriction probable, further examinations recommended. Saw pulm 11/2012: placed on GERD rx and short course prednisone, planned to regroup.  . LV dysfunction     a. Identified 10/2012 by echo EF 20-25%. b. Echo 01/2013: EF 20-25%, TEE EF 15% with severe RV dysfunction as well.  [name] History of palpitations     2x/month for several years feels heart racing  . CVA (cerebral vascular accident) 01/23/2013    tPA administered; MRI brain showed small acute R MCA infarct  . PFO (patent foramen ovale)     a. By TEE 01/2013.  [name] CHF (congestive heart failure)     ROS:   All systems reviewed and negative except as noted in the HPI.   Past Surgical History  Procedure Laterality Date  . No  past surgeries    . Tee without cardioversion N/A 01/26/2013    Procedure: TRANSESOPHAGEAL ECHOCARDIOGRAM (TEE);  Surgeon: [name], MD;  Location: [facility] ENDOSCOPY;  Service: Cardiovascular;  Laterality: N/A;     Family History  Problem Relation Age of Onset  . Sarcoidosis Mother   . Heart Problems Mother     PPM implantation age 16     History   Social History  . Marital Status: Single    Spouse Name: N/A    Number of Children: N/A  . Years of Education: N/A   Occupational History  . Not on file.   Social History Main Topics  . Smoking status: Never Smoker   . Smokeless tobacco: Not on file  . Alcohol Use: No  . Drug Use: No  . Sexual Activity: Yes   Other Topics Concern  . Not on file   Social History Narrative   Marital status: single     Children: 3 children (50, 1, 18month old)      Lives: with girlfriend.      Employment: works for city of [employer]      Tobacco: none      Alcohol: none      Drugs: none      Exercise: basketball every Friday night.     BP 119/90  Pulse 78  Ht 6\' 3"  (1.905 m)  Wt 237 lb (107.502 kg)  BMI 29.62 kg/m2  Physical Exam:  Well appearing 32 year old man, NAD HEENT: Unremarkable Neck:  7 cm JVD, no thyromegally Back:  No CVA tenderness Lungs:  Clear with no wheezes, rales, or rhonchi. HEART:  Regular rate rhythm, no murmurs, no rubs, no clicks Abd:  soft, positive bowel sounds, no organomegally, no rebound, no guarding Ext:  2 plus pulses, no edema, no cyanosis, no clubbing Skin:  No rashes no nodules Neuro:  CN II through XII intact, motor grossly intact  EKG - nsr  Assess/Plan:

## 2013-02-25 NOTE — Assessment & Plan Note (Signed)
I discussed the treatment options with the patient and his wife in detail. While he typically would qualify for ICD implantation, based on his nonischemic cardiomyopathy, left ventricular dysfunction, and heart failure, his symptoms are improved, and nearly class I. For this reason, and because the patient is not inclined to proceed with ICD implantation, we will undergo watchful waiting and up titration of his medical therapy. His meds have not been maximally up titrated. We'll increase his dose of carvedilol today and he will need ongoing medication adjustment of both his beta blocker and losartan. He has not have problems with volume, and for this reason I would not recommend a diuretic, unless Aldactone was being considered. I will defer this to his primary cardiologist. Ultimately, if his left ventricular dysfunction does not improve, prophylactic ICD insertion would be indicated, particularly if his heart failure symptoms worsen. I've recommended that he maintain a low-sodium diet, avoid caffeine and alcohol, and abstain from any recreational drugs. He denies use of any recreational drugs at this time.

## 2013-02-26 ENCOUNTER — Ambulatory Visit (INDEPENDENT_AMBULATORY_CARE_PROVIDER_SITE_OTHER): Payer: 59 | Admitting: Neurology

## 2013-02-26 ENCOUNTER — Ambulatory Visit (INDEPENDENT_AMBULATORY_CARE_PROVIDER_SITE_OTHER): Payer: 59

## 2013-02-26 ENCOUNTER — Encounter: Payer: Self-pay | Admitting: *Deleted

## 2013-02-26 ENCOUNTER — Ambulatory Visit: Payer: 59 | Admitting: Neurology

## 2013-02-26 ENCOUNTER — Encounter: Payer: Self-pay | Admitting: Neurology

## 2013-02-26 VITALS — BP 104/73 | HR 65

## 2013-02-26 DIAGNOSIS — I6350 Cerebral infarction due to unspecified occlusion or stenosis of unspecified cerebral artery: Secondary | ICD-10-CM

## 2013-02-26 DIAGNOSIS — Z0289 Encounter for other administrative examinations: Secondary | ICD-10-CM

## 2013-02-26 NOTE — Patient Instructions (Addendum)
He was advised to continue xarelto for stroke prevention and maintain strict control of blood pressure. He was advised to stay out of work till he gets cardiology clearance from Dr. Elease Hashimoto. Return for followup in 3 months with Larita Fife, NP

## 2013-02-26 NOTE — Progress Notes (Signed)
[facility] [address] [place]. [place] [address] [phone]       OFFICE FOLLOW-UP NOTE  [name] Date of Birth:  1980/12/17 Medical Record Number:  [id number]   HPI: Mr Ellery is a 32 year African American male seen for first office followup visit today after hospital admission on 01/24/13 for stroke. He presented with a weird sensation on the left side of his face and arm. His wife called EMS and his symptoms worsened en route and he was found to have left facial droop and left-sided weakness. Code stroke was called. He was felt to be candidate for TPA which was administered uneventfully. He showed significant improvement except for mild residual left facial numbness. MRI scan of the brain showed a right middle cerebral artery branch infarct involving the pre-motor cortex. MRA of the brain showed no significant large vessel occlusion. Carotid Dopplers were unremarkable. Transthoracic echo showed decreased ejection fraction of 2045% with no obvious clot. Transesophageal echocardiogram confirmed severe LV dysfunction and showed spontaneous echo contrast in the left ventricle as well as a patent foramen ovale. Hemoglobin A1c was 5.6% and lipid profile showed total cholesterol 167, HDL 59, LDL 97 mg percent. He was started on Xarelto anticoagulation   for presumed cardiogenic embolism from his left ventricular dysfunction and probable clot. He states he has done well since discharge and he has seen his cardiologist and has been advised to have a defibrillator implanted. His left-sided strength and motor skills have improved though she still has some diminished fine motor skills. He still has some numbness on his left face the arm and leg have improved. 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  ROS:   14 system review of systems is positive for numbness, dizziness, headache, depression, not enough  sleep, change in appetite, insomnia PMH:  Past Medical History  Diagnosis Date  . Cough     a. PFTS 09/2012: restriction probable, further examinations recommended. Saw pulm 11/2012: placed on GERD rx and short course prednisone, planned to regroup.  . LV dysfunction     a. Identified 10/2012 by echo EF 20-25%. b. Echo 01/2013: EF 20-25%, TEE EF 15% with severe RV dysfunction as well.  [name] History of palpitations     2x/month for several years feels heart racing  . CVA (cerebral vascular accident) 01/23/2013    tPA administered; MRI brain showed small acute R MCA infarct  . PFO (patent foramen ovale)     a. By TEE 01/2013.  [name] CHF (congestive heart failure)     Social History:  History   Social History  . Marital Status: Single    Spouse Name: N/A    Number of Children: N/A  . Years of Education: N/A   Occupational History  . Not on file.   Social History Main Topics  . Smoking status: Never Smoker   . Smokeless tobacco: Not on file  . Alcohol Use: No  . Drug Use: No  . Sexual Activity: Yes   Other Topics Concern  . Not on file   Social History Narrative   Marital status: single     Children: 3 children (78, 1, 40month old)      Lives: with girlfriend.      Employment: works for city of [employer]      Tobacco: none      Alcohol: none  Drugs: none      Exercise: basketball every Friday night.    Medications:   Current Outpatient Prescriptions on File Prior to Visit  Medication Sig Dispense Refill  . carvedilol (COREG) 6.25 MG tablet Take 1 1/2 tablets by mouth twice daily  90 tablet  6  . losartan (COZAAR) 25 MG tablet Take 1 tablet (25 mg total) by mouth daily.  30 tablet  2  . Rivaroxaban (XARELTO) 20 MG TABS Take 1 tablet (20 mg total) by mouth daily.  30 tablet  2   No current facility-administered medications on file prior to visit.    Allergies:  No Known Allergies  Physical Exam General: well developed, well nourished young [place]  American male, seated, in no evident distress Head: head normocephalic and atraumatic. Orohparynx benign Neck: supple with no carotid or supraclavicular bruits Cardiovascular: regular rate and rhythm, no murmurs Musculoskeletal: no deformity Skin:  no rash/petichiae Vascular:  Normal pulses all extremities Filed Vitals:   02/26/13 1634  BP: 104/73  Pulse: 65    Neurologic Exam Mental Status: Awake and fully alert. Oriented to place and time. Recent and remote memory intact. Attention span, concentration and fund of knowledge appropriate. Mood and affect appropriate.  Cranial Nerves: Fundoscopic exam reveals sharp disc margins. Pupils equal, briskly reactive to light. Extraocular movements full without nystagmus. Visual fields full to confrontation. Hearing intact. Facial sensation intact. Face, tongue, palate moves normally and symmetrically.  Motor: Normal bulk and tone. Normal strength in all tested extremity muscles. Diminished fine finger movements on the left. Orbits right over left approximately. Sensory.: Diminished touch and pinprick sensation on the left face only Coordination: Rapid alternating movements normal in all extremities. Finger-to-nose and heel-to-shin performed accurately bilaterally. Gait and Station: Arises from chair without difficulty. Stance is normal. Gait demonstrates normal stride length and balance .  Reflexes: 1+ and symmetric. Toes downgoing.   NIHSS 1 Modified Rankin  1  ASSESSMENT: 32 year old African American male with embolic right middle cerebral artery branch infarct in July 2014 treated with IV TPA with good neurological improvement. Vascular risk factors of cardiomyopathy with low ejection fraction and patent foramen ovale.    PLAN: He was advised to continue xarelto for stroke prevention and maintain strict control of blood pressure. He was advised to stay out of work till he gets cardiology clearance from Dr. Elease Hashimoto. Return for followup in 3  months with Larita Fife, NP.Check TCD Bubble study to further categorize his PFO.       [facility]      [address]      [place]. [place] [address] [phone]       TRANSCRANIAL DOPPLER BUBBLE STUDY   Mr. TYRIK STETZER Date of Birth:  1981/02/13 Medical Record Number:  [id number]   Indications: Diagnostic Date of Procedure: 02/26/2013 Clinical History:  32 year male with Rt MCA stroke Technical Description:   Transcranial Doppler Bubble Study was performed at the bedside after taking written informed consent from the patient and explaining risk/benefits. Both middle cerebral arteries were insonated using a headset. And IV line was inserted in the left forearm by the RN using aseptic precautions. Agitated saline injection at rest and after valsalva maneuver did  result in high intensity transient signals (HITS).   Impression:  Positive  Transcranial Doppler Bubble Study indicative  Of medium size right to left intracardiac shunt.   Results were explained to the patient. Questions were answered.I had a long discussion with the patient with regards  to the role of patent foramen ovale   and risk of stroke. It is unclear at the present time whether PFO closure leads to better secondary stroke prevention or not. There are ongoing clinical trials which are trying to address this issue. I would anticoagulation with xarelto  therapy for now given his low EF and risk for cardaic clot.[name]

## 2013-02-26 NOTE — Telephone Encounter (Signed)
App was made and pt cancelled.

## 2013-02-26 NOTE — Telephone Encounter (Signed)
Pt was seen by Dr Ladona Ridgel

## 2013-03-04 ENCOUNTER — Ambulatory Visit: Payer: Self-pay | Admitting: Internal Medicine

## 2013-03-10 ENCOUNTER — Telehealth: Payer: Self-pay | Admitting: *Deleted

## 2013-03-10 ENCOUNTER — Encounter: Payer: Self-pay | Admitting: Cardiovascular Disease

## 2013-03-10 ENCOUNTER — Ambulatory Visit (INDEPENDENT_AMBULATORY_CARE_PROVIDER_SITE_OTHER): Payer: 59 | Admitting: Cardiovascular Disease

## 2013-03-10 ENCOUNTER — Encounter: Payer: Self-pay | Admitting: *Deleted

## 2013-03-10 VITALS — BP 118/78 | HR 67 | Ht 75.0 in | Wt 242.0 lb

## 2013-03-10 DIAGNOSIS — I5022 Chronic systolic (congestive) heart failure: Secondary | ICD-10-CM

## 2013-03-10 DIAGNOSIS — I509 Heart failure, unspecified: Secondary | ICD-10-CM

## 2013-03-10 MED ORDER — LOSARTAN POTASSIUM 50 MG PO TABS: 50.0000 mg | ORAL_TABLET | Freq: Every day | ORAL | Status: DC

## 2013-03-10 NOTE — Assessment & Plan Note (Addendum)
He seems to be doing fairly well. He remains asymptomatic and would be classified class I.  There is a suggestion that he has left ventricular non-compaction based on MRI scan.  I have reviewed that scan with Dr. Delton See and she does not think it is c/w ventricular non-compaction.     We'll increase his Losartan  and 50 mg a day. He'll call if he develops any symptoms of orthostatic hypotension. I seen again in several months.  We will write him a note to return to work on Monday, Sept. 8.   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   I will recommend that he not work in the [employer] .  Hopefully, he can be moved to a different deparment.   Continue the Xarelto for now

## 2013-03-10 NOTE — Progress Notes (Signed)
Nathan Baird Date of Birth  1980-11-25       [facility]    [place] [address]  [address] [place], [place]  [address]   [place], [place]  [address] [phone]     [phone]   Fax  [phone]    Fax [phone]  Problem List: 1. Congestive heart failure-ejection fraction of 20-25% by echo 2. Restrictive lung disease 3. CVA   History of Present Illness:  Nathan Baird is a 32 year old gentleman who is a new consultation for further evaluation of his cough. He had an echocardiogram and was found to have ejection fraction of between 20 and 25%. I started him on Lasix 40 mg a day as well as potassium chloride 20 mg a day. He developed severe orthostatic hypotension and felt very poorly after taking one dose Lasix and he stopped.  Surprisingly, he does not have much shortness of breath. His basketball on a regular basis. He is able to play for hours at a time and does not have any significant shortness of breath. He does have a cough. He's tried a Z-Pak which cleared up the greenish color of his sputum but he is still having this cough.  He denies any PND orthopnea. He denies any syncope or presyncope. He denies any chest pain.  He does not have any cardiac history.  Dec 02, 2012:  Nathan Baird is feeling a bit better. His cough has improved. He still has episodes of profound fatigue.  His breathing is better.  He has greatly reduced his salt intake.  He still plays basket ball on a regular basis.   March 10, 2013:     Current Outpatient Prescriptions on File Prior to Visit  Medication Sig Dispense Refill  . carvedilol (COREG) 6.25 MG tablet Take 1 1/2 tablets by mouth twice daily  90 tablet  6  . losartan (COZAAR) 25 MG tablet Take 1 tablet (25 mg total) by mouth daily.  30 tablet  2  . Rivaroxaban (XARELTO) 20 MG TABS Take 1 tablet (20 mg total) by mouth daily.  30 tablet  2   No current facility-administered medications on file  prior to visit.    No Known Allergies  Past Medical History  Diagnosis Date  . Cough     a. PFTS 09/2012: restriction probable, further examinations recommended. Saw pulm 11/2012: placed on GERD rx and short course prednisone, planned to regroup.  . LV dysfunction     a. Identified 10/2012 by echo EF 20-25%. b. Echo 01/2013: EF 20-25%, TEE EF 15% with severe RV dysfunction as well.  [name] History of palpitations     2x/month for several years feels heart racing  . CVA (cerebral vascular accident) 01/23/2013    tPA administered; MRI brain showed small acute R MCA infarct  . PFO (patent foramen ovale)     a. By TEE 01/2013.  [name] CHF (congestive heart failure)     Past Surgical History  Procedure Laterality Date  . No past surgeries    . Tee without cardioversion N/A 01/26/2013    Procedure: TRANSESOPHAGEAL ECHOCARDIOGRAM (TEE);  Surgeon: [name], MD;  Location: [facility] ENDOSCOPY;  Service: Cardiovascular;  Laterality: N/A;    History  Smoking status  . Never Smoker   Smokeless tobacco  . Not on file    History  Alcohol Use No    Family History  Problem Relation Age of Onset  . Sarcoidosis Mother   .  Heart Problems Mother     PPM implantation age 68    Reviw of Systems:  Reviewed in the HPI.  All other systems are negative.  Physical Exam: Blood pressure 118/78, pulse 67, height 6\' 3"  (1.905 m), weight 242 lb (109.77 kg), SpO2 99.00%. General: Well developed, well nourished, in no acute distress.  Head: Normocephalic, atraumatic, sclera non-icteric, mucus membranes are moist,   Neck: Supple. Carotids are 2 + without bruits. No JVD   Lungs: Clear   Heart: Regular rate S1-S2. No S3 gallop.  Abdomen: Soft, non-tender, non-distended with normal bowel sounds.  Msk:  Strength and tone are normal   Extremities: No clubbing or cyanosis. No edema.  Distal pedal pulses are 2+ and equal    Neuro: CN II - XII intact.  Alert and oriented X 3.   Psych:  Normal    ECG: 11/04/2012: Normal sinus rhythm at 66 beats a minute. He has T-wave inversions in lead V3 through V6.  Assessment / Plan:

## 2013-03-10 NOTE — Telephone Encounter (Signed)
Pt was called / reviewed message, pt verbalized understanding.

## 2013-03-10 NOTE — Telephone Encounter (Signed)
Message copied by Antony Odea on Wed Mar 10, 2013 11:29 AM ------      Message from: [name]      Created: Wed Mar 10, 2013 11:04 AM       Talked with Dr. Delton See.      She does not think that Nathan Baird has ventricular non-compaction.  Will you call him and inform him that is not the congenital abnormality that was orignially diagnosed.            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  We will continue the meds and the heart should gradually improve. ------

## 2013-03-10 NOTE — Patient Instructions (Addendum)
Your physician has recommended you make the following change in your medication:   INCREASE LOSARTAN TO 50 MG DAILY/ USE CURRENT 25 MG TABLETS/ TAKE 2 AT ONE TIME DAILY TILL GONE/ THEN PICK UP NEW  50 MG TABLET AND TAKE ONE DAILY  YOU MAY RETURN TO WORK Monday.  Your physician recommends that you schedule a follow-up appointment in: 2 MONTHS

## 2013-03-30 ENCOUNTER — Ambulatory Visit: Payer: 59 | Admitting: Nurse Practitioner

## 2013-04-14 ENCOUNTER — Telehealth: Payer: Self-pay | Admitting: Cardiovascular Disease

## 2013-04-14 NOTE — Telephone Encounter (Signed)
Pt was called back and informed that we did not receive any request for records.

## 2013-04-14 NOTE — Telephone Encounter (Signed)
His place of employment requested records 2 days ago. He is wondering about the status of that. MR contacted to see if they have received request/ I have not seen one. 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  Pt told I will call him back with his advice.

## 2013-04-14 NOTE — Telephone Encounter (Signed)
New problem:  Pt states his job sent in paperwork and he wanted to check on the status...  Pt states he wants to know if he is medically cleared to work his part-time job. Pt would like the doctor to write a letter explaining why he cannot stay employed at his part-time job. Pt states he'd like to leave his job on good terms. Please advise

## 2013-04-15 NOTE — Telephone Encounter (Signed)
I have not seen any paperwork mentioned.

## 2013-04-15 NOTE — Telephone Encounter (Signed)
Please advise if he should stop his part time job. See prior note.

## 2013-04-16 NOTE — Telephone Encounter (Signed)
Given his diagnosis of systolic CHF, I would imagine that it would be difficult for him to work 2 jobs and I would agree that he should probably quit his part time job.

## 2013-04-16 NOTE — Telephone Encounter (Signed)
msg left with information and asked him to call back with questions next week.

## 2013-04-19 ENCOUNTER — Encounter: Payer: Self-pay | Admitting: *Deleted

## 2013-04-19 NOTE — Telephone Encounter (Signed)
Pt was called, letter written and placed at desk for pick up.

## 2013-04-19 NOTE — Telephone Encounter (Signed)
Follow up      Pt called back please call him back @  [phone].

## 2013-04-20 ENCOUNTER — Telehealth: Payer: Self-pay | Admitting: Cardiovascular Disease

## 2013-04-20 NOTE — Telephone Encounter (Signed)
New Problem:  Pt is asking Jodette call his manager, Madalyn Rob --[phone]...  Pt states it's regarding paperwork. I kept asking the pt what the call was in reference to but pt was very vague... Call the patient with any questions.

## 2013-04-20 NOTE — Telephone Encounter (Signed)
Called  MR/  message left, pt's employer has requested MR x 3 and has had no response. Asked Selena Batten to look into this situation.

## 2013-05-10 ENCOUNTER — Other Ambulatory Visit: Payer: 59

## 2013-05-10 ENCOUNTER — Ambulatory Visit (INDEPENDENT_AMBULATORY_CARE_PROVIDER_SITE_OTHER): Payer: 59 | Admitting: Cardiovascular Disease

## 2013-05-10 ENCOUNTER — Encounter: Payer: Self-pay | Admitting: Cardiovascular Disease

## 2013-05-10 ENCOUNTER — Encounter (INDEPENDENT_AMBULATORY_CARE_PROVIDER_SITE_OTHER): Payer: Self-pay

## 2013-05-10 VITALS — BP 102/80 | HR 80 | Ht 75.0 in | Wt 243.0 lb

## 2013-05-10 DIAGNOSIS — I509 Heart failure, unspecified: Secondary | ICD-10-CM

## 2013-05-10 DIAGNOSIS — R0602 Shortness of breath: Secondary | ICD-10-CM

## 2013-05-10 DIAGNOSIS — I5022 Chronic systolic (congestive) heart failure: Secondary | ICD-10-CM

## 2013-05-10 LAB — BASIC METABOLIC PANEL
Chloride: 104 mEq/L (ref 96–112)
Potassium: 4.2 mEq/L (ref 3.5–5.1)

## 2013-05-10 LAB — BRAIN NATRIURETIC PEPTIDE: Pro B Natriuretic peptide (BNP): 12 pg/mL (ref 0.0–100.0)

## 2013-05-10 MED ORDER — RIVAROXABAN 20 MG PO TABS: 20.0000 mg | ORAL_TABLET | Freq: Every day | ORAL | Status: DC

## 2013-05-10 MED ORDER — CARVEDILOL 12.5 MG PO TABS: ORAL_TABLET | ORAL | Status: DC

## 2013-05-10 NOTE — Progress Notes (Signed)
Nathan Baird Date of Birth  1981-03-19       [facility]    [place] [address]  [address] [place], [place]  [address]   [place], [place]  [address] [phone]     [phone]   Fax  [phone]    Fax [phone]  Problem List: 1. Congestive heart failure-ejection fraction of 20-25% by echo 2. Restrictive lung disease 3. CVA  History of Present Illness:  Nathan Baird is a 32 year old gentleman who is a new consultation for further evaluation of his cough. He had an echocardiogram and was found to have ejection fraction of between 20 and 25%. I started him on Lasix 40 mg a day as well as potassium chloride 20 mg a day. He developed severe orthostatic hypotension and felt very poorly after taking one dose Lasix and he stopped.  Surprisingly, he does not have much shortness of breath. His basketball on a regular basis. He is able to play for hours at a time and does not have any significant shortness of breath. He does have a cough. He's tried a Z-Pak which cleared up the greenish color of his sputum but he is still having this cough.  He denies any PND orthopnea. He denies any syncope or presyncope. He denies any chest pain.  He does not have any cardiac history.  Dec 02, 2012:  Nathan Baird is feeling a bit better. His cough has improved. He still has episodes of profound fatigue.  His breathing is better.  He has greatly reduced his salt intake.  He still plays basket ball on a regular basis.   March 10, 2013:  Nov. 3, 2014:  Nathan Baird has gained some weight since his last ov.   He complains of a slight head ache / dizziness.   He has occasional episodes of orthostasis.   Otherwise, he is feeling well - breathing well.    Current Outpatient Prescriptions on File Prior to Visit  Medication Sig Dispense Refill  . carvedilol (COREG) 6.25 MG tablet Take 1 1/2 tablets by mouth twice daily  90 tablet  6  . losartan (COZAAR) 50 MG tablet  Take 1 tablet (50 mg total) by mouth daily.  30 tablet  2  . Rivaroxaban (XARELTO) 20 MG TABS Take 1 tablet (20 mg total) by mouth daily.  30 tablet  2   No current facility-administered medications on file prior to visit.    No Known Allergies  Past Medical History  Diagnosis Date  . Cough     a. PFTS 09/2012: restriction probable, further examinations recommended. Saw pulm 11/2012: placed on GERD rx and short course prednisone, planned to regroup.  . LV dysfunction     a. Identified 10/2012 by echo EF 20-25%. b. Echo 01/2013: EF 20-25%, TEE EF 15% with severe RV dysfunction as well.  [name] History of palpitations     2x/month for several years feels heart racing  . CVA (cerebral vascular accident) 01/23/2013    tPA administered; MRI brain showed small acute R MCA infarct  . PFO (patent foramen ovale)     a. By TEE 01/2013.  [name] CHF (congestive heart failure)     Past Surgical History  Procedure Laterality Date  . No past surgeries    . Tee without cardioversion N/A 01/26/2013    Procedure: TRANSESOPHAGEAL ECHOCARDIOGRAM (TEE);  Surgeon: [name], MD;  Location: [facility] ENDOSCOPY;  Service: Cardiovascular;  Laterality: N/A;  History  Smoking status  . Never Smoker   Smokeless tobacco  . Not on file    History  Alcohol Use No    Family History  Problem Relation Age of Onset  . Sarcoidosis Mother   . Heart Problems Mother     PPM implantation age 60    Reviw of Systems:  Reviewed in the HPI.  All other systems are negative.  Physical Exam: Blood pressure 102/80, pulse 80, height 6\' 3"  (1.905 m), weight 243 lb (110.224 kg). General: Well developed, well nourished, in no acute distress.  Head: Normocephalic, atraumatic, sclera non-icteric, mucus membranes are moist,   Neck: Supple. Carotids are 2 + without bruits. No JVD   Lungs: Clear   Heart: Regular rate S1-S2. No S3 gallop.  Abdomen: Soft, non-tender, non-distended with normal bowel sounds.  Msk:  Strength  and tone are normal   Extremities: No clubbing or cyanosis. No edema.  Distal pedal pulses are 2+ and equal    Neuro: CN II - XII intact.  Alert and oriented X 3.   Psych:  Normal   ECG: 11/04/2012: Normal sinus rhythm at 66 beats a minute. He has T-wave inversions in lead V3 through V6.  Assessment / Plan:

## 2013-05-10 NOTE — Assessment & Plan Note (Signed)
10 she is making slow progress. We'll continue to gradually titrate up his medications. His heart rate still a bit faster than I would like. We will increase his carvedilol from 9.375 mg twice a day up to 12.5 mg twice a day.  He'll call us if he develops any weakness or dizziness.  His blood pressure is about as low as he'll tolerate. I do not think it we can increase his Bussard at this point. In addition, I don't think that he'll necessarily tolerate spironolactone although we can consider adding spironolactone in the future.  He's having a slight headache has been chronic. I don't think that it is necessarily do to the medications or his markedly low blood pressure. I've encouraged him to see his medical doctor for further evaluation of his headache.

## 2013-05-10 NOTE — Patient Instructions (Signed)
Your physician recommends that you return for lab work in: today bnp bmet   Your physician has requested that you have an echocardiogram. Echocardiography is a painless test that uses sound waves to create images of your heart. It provides your doctor with information about the size and shape of your heart and how well your heart's chambers and valves are working. This procedure takes approximately one hour. There are no restrictions for this procedure.  Your physician has recommended you make the following change in your medication: increase carvedilol to 12.5 mg twice daily 12 hours apart  Your physician recommends that you schedule a follow-up appointment in: 2 months /// take a consult app.

## 2013-05-21 ENCOUNTER — Ambulatory Visit (HOSPITAL_COMMUNITY): Payer: 59 | Attending: Cardiovascular Disease | Admitting: Radiology

## 2013-05-21 DIAGNOSIS — Z8673 Personal history of transient ischemic attack (TIA), and cerebral infarction without residual deficits: Secondary | ICD-10-CM | POA: Insufficient documentation

## 2013-05-21 DIAGNOSIS — I509 Heart failure, unspecified: Secondary | ICD-10-CM | POA: Insufficient documentation

## 2013-05-21 DIAGNOSIS — I5022 Chronic systolic (congestive) heart failure: Secondary | ICD-10-CM

## 2013-05-21 DIAGNOSIS — R0602 Shortness of breath: Secondary | ICD-10-CM

## 2013-05-21 NOTE — Progress Notes (Signed)
Echocardiogram performed.  

## 2013-06-14 ENCOUNTER — Ambulatory Visit (INDEPENDENT_AMBULATORY_CARE_PROVIDER_SITE_OTHER): Payer: 59 | Admitting: Family Medicine

## 2013-06-14 VITALS — BP 126/74 | HR 79 | Temp 98.5°F | Resp 18 | Ht 73.5 in | Wt 248.0 lb

## 2013-06-14 DIAGNOSIS — R6889 Other general symptoms and signs: Secondary | ICD-10-CM

## 2013-06-14 DIAGNOSIS — J09X2 Influenza due to identified novel influenza A virus with other respiratory manifestations: Secondary | ICD-10-CM

## 2013-06-14 DIAGNOSIS — J029 Acute pharyngitis, unspecified: Secondary | ICD-10-CM

## 2013-06-14 DIAGNOSIS — R059 Cough, unspecified: Secondary | ICD-10-CM

## 2013-06-14 DIAGNOSIS — R05 Cough: Secondary | ICD-10-CM

## 2013-06-14 LAB — POCT RAPID STREP A (OFFICE): Rapid Strep A Screen: NEGATIVE

## 2013-06-14 LAB — POCT INFLUENZA A/B
Influenza A, POC: POSITIVE
Influenza B, POC: NEGATIVE

## 2013-06-14 MED ORDER — OSELTAMIVIR PHOSPHATE 75 MG PO CAPS
75.0000 mg | ORAL_CAPSULE | Freq: Two times a day (BID) | ORAL | Status: DC
Start: 2013-06-14 — End: 2013-08-06

## 2013-06-14 MED ORDER — BENZONATATE 100 MG PO CAPS: 200.0000 mg | ORAL_CAPSULE | Freq: Two times a day (BID) | ORAL | Status: DC | PRN

## 2013-06-14 NOTE — Patient Instructions (Signed)

## 2013-06-14 NOTE — Progress Notes (Signed)
Chief Complaint:  Chief Complaint  Patient presents with  . Cough    x3 days   . Night Sweats  . Generalized Body Aches    HPI: Nathan Baird is a 32 y.o. male who is here for  2 -3 day history of sinus HA and had soreness in his neck muscles, he had nause and vomiting. Since Saturday he has sweating and it was not even hot while he was working.  He has had sneezing and coughing, coughing up mucus and was yellow. He has had soreness in his midchest. Vomited x 1. Subjective fevers. Denies SOB or cough Feels like he has ear pressure, no facial pain.   Past Medical History  Diagnosis Date  . Cough     a. PFTS 09/2012: restriction probable, further examinations recommended. Saw pulm 11/2012: placed on GERD rx and short course prednisone, planned to regroup.  . LV dysfunction     a. Identified 10/2012 by echo EF 20-25%. b. Echo 01/2013: EF 20-25%, TEE EF 15% with severe RV dysfunction as well.  [name] History of palpitations     2x/month for several years feels heart racing  . CVA (cerebral vascular accident) 01/23/2013    tPA administered; MRI brain showed small acute R MCA infarct  . PFO (patent foramen ovale)     a. By TEE 01/2013.  [name] CHF (congestive heart failure)    Past Surgical History  Procedure Laterality Date  . No past surgeries    . Tee without cardioversion N/A 01/26/2013    Procedure: TRANSESOPHAGEAL ECHOCARDIOGRAM (TEE);  Surgeon: [name], MD;  Location: [facility] ENDOSCOPY;  Service: Cardiovascular;  Laterality: N/A;   History   Social History  . Marital Status: Single    Spouse Name: N/A    Number of Children: N/A  . Years of Education: N/A   Social History Main Topics  . Smoking status: Never Smoker   . Smokeless tobacco: None  . Alcohol Use: No  . Drug Use: No  . Sexual Activity: Yes   Other Topics Concern  . None   Social History Narrative   Marital status: single     Children: 3 children (9, 1, 39month old)      Lives: with girlfriend.   Employment: works for city of [employer]      Tobacco: none      Alcohol: none      Drugs: none      Exercise: basketball every Friday night.   Family History  Problem Relation Age of Onset  . Sarcoidosis Mother   . Heart Problems Mother     PPM implantation age 92   No Known Allergies Prior to Admission medications   Medication Sig Start Date End Date Taking? Authorizing Provider  carvedilol (COREG) 12.5 MG tablet Take 1  tablets by mouth twice daily, 12 hours apart 05/10/13  Yes [name], MD  losartan (COZAAR) 50 MG tablet Take 1 tablet (50 mg total) by mouth daily. 03/10/13  Yes [name], MD  Rivaroxaban (XARELTO) 20 MG TABS tablet Take 1 tablet (20 mg total) by mouth daily. 05/10/13  Yes [name], MD     ROS: The patient denies unintentional weight loss, chest pain, palpitations, wheezing, dyspnea on exertion, nausea, vomiting, abdominal pain, dysuria, hematuria, melena, numbness, weakness, or tingling.   All other systems have been reviewed and were otherwise negative with the exception of those mentioned in the HPI and as  above.    PHYSICAL EXAM: Filed Vitals:   06/14/13 1251  BP: 126/74  Pulse: 79  Temp: 98.5 F (36.9 C)  Resp: 18   Filed Vitals:   06/14/13 1251  Height: 6' 1.5" (1.867 m)  Weight: 248 lb (112.492 kg)   Body mass index is 32.27 kg/(m^2).  General: Alert, no acute distress, tired appearing HEENT:  Normocephalic, atraumatic, oropharynx patent. EOMI, PERRLA, no exudates, TM nl Cardiovascular:  Regular rate and rhythm, no rubs , no gallops.  No Carotid bruits, radial pulse intact. No pedal edema.  Respiratory: Clear to auscultation bilaterally.  No wheezes, rales, or rhonchi.  No cyanosis, no use of accessory musculature GI: No organomegaly, abdomen is soft and non-tender, positive bowel sounds.  No masses. Skin: No rashes. Neurologic: Facial musculature symmetric. Psychiatric: Patient is appropriate throughout our  interaction. Lymphatic: No cervical lymphadenopathy Musculoskeletal: Gait intact.   LABS: Results for orders placed in visit on 06/14/13  POCT RAPID STREP A (OFFICE)      Result Value Range   Rapid Strep A Screen Negative  Negative  POCT INFLUENZA A/B      Result Value Range   Influenza A, POC Positive     Influenza B, POC Negative       EKG/XRAY:   Primary read interpreted by Dr. Conley Rolls at [facility].   ASSESSMENT/PLAN: Encounter Diagnoses  Name Primary?  . Flu-like symptoms   . Acute pharyngitis   . Influenza due to identified novel influenza A virus with other respiratory manifestations Yes  . Cough    Rx Tamiflu, tessalon perles F/u prn Work note given Precautions given to Mr Matus due to h/o CHF and low EF , if he feels worse and not getting slowly better then consider secondary infection   Gross sideeffects, risk and benefits, and alternatives of medications d/w patient. Patient is aware that all medications have potential sideeffects and we are unable to predict every sideeffect or drug-drug interaction that may occur.  Kassi Esteve PHUONG, DO 06/16/2013 11:21 AM

## 2013-06-16 ENCOUNTER — Telehealth: Payer: Self-pay

## 2013-06-16 ENCOUNTER — Telehealth: Payer: Self-pay | Admitting: Radiology

## 2013-06-16 NOTE — Telephone Encounter (Signed)
Yes, thank you. I have called him to advise.

## 2013-06-16 NOTE — Telephone Encounter (Signed)
Message copied by Caffie Damme on Wed Jun 16, 2013 12:31 PM ------      Message from: Hamilton Capri P      Created: Wed Jun 16, 2013 11:26 AM       Hi Amy-            Can you call him back and let him know that he has to be very careful if he goes back to work, he is on the Tamiflu and it can make you feel falsely better than you really are, so he really needs not to exert himself. He has a history of CVA on xarelto and also CHF with a current EF of 20-25%. I had taken him out of work for 5 days but if he returns to work and is not feeling better and getting worse then he needs to take the full 5 days to recover.             Thanks,      T Le             ------

## 2013-06-16 NOTE — Telephone Encounter (Signed)
Left message for him to call me back, he should return to work after he has been without fever for 24 hrs.

## 2013-06-16 NOTE — Telephone Encounter (Signed)
Patient advised. Note provided

## 2013-06-16 NOTE — Telephone Encounter (Signed)
Patient says he was diagnosed with the flu by Dr. Conley Rolls. Says he is feeling better and is at work right now. His employer needs to know if he is well enough to come back to work or is he still contagious. Cb# [id number].

## 2013-06-18 NOTE — Telephone Encounter (Signed)
Called again, had previously left message. Left another detailed message.

## 2013-07-19 ENCOUNTER — Other Ambulatory Visit: Payer: Self-pay

## 2013-07-19 MED ORDER — LOSARTAN POTASSIUM 50 MG PO TABS: 50.0000 mg | ORAL_TABLET | Freq: Every day | ORAL | Status: DC

## 2013-08-06 ENCOUNTER — Other Ambulatory Visit (INDEPENDENT_AMBULATORY_CARE_PROVIDER_SITE_OTHER): Payer: 59

## 2013-08-06 ENCOUNTER — Ambulatory Visit (INDEPENDENT_AMBULATORY_CARE_PROVIDER_SITE_OTHER): Payer: 59 | Admitting: Cardiovascular Disease

## 2013-08-06 VITALS — BP 110/90 | HR 66 | Ht 74.0 in | Wt 254.8 lb

## 2013-08-06 DIAGNOSIS — I5022 Chronic systolic (congestive) heart failure: Secondary | ICD-10-CM

## 2013-08-06 DIAGNOSIS — I509 Heart failure, unspecified: Secondary | ICD-10-CM

## 2013-08-06 DIAGNOSIS — R0602 Shortness of breath: Secondary | ICD-10-CM

## 2013-08-06 LAB — BASIC METABOLIC PANEL
BUN: 16 mg/dL (ref 6–23)
CHLORIDE: 106 meq/L (ref 96–112)
CO2: 29 meq/L (ref 19–32)
CREATININE: 1.1 mg/dL (ref 0.4–1.5)
Calcium: 9.6 mg/dL (ref 8.4–10.5)
GFR: 96.79 mL/min (ref >60.00)
GLUCOSE: 78 mg/dL (ref 70–99)
POTASSIUM: 3.9 meq/L (ref 3.5–5.1)
Sodium: 140 mEq/L (ref 135–145)

## 2013-08-06 LAB — BRAIN NATRIURETIC PEPTIDE: PRO B NATRI PEPTIDE: 9 pg/mL (ref 0.0–100.0)

## 2013-08-06 MED ORDER — LOSARTAN POTASSIUM 100 MG PO TABS: 100.0000 mg | ORAL_TABLET | Freq: Every day | ORAL | Status: DC

## 2013-08-06 NOTE — Progress Notes (Signed)
Wt Readings from Last 3 Encounters:  08/06/13 254 lb 12.8 oz (115.577 kg)  06/14/13 248 lb (112.492 kg)  05/10/13 243 lb (110.224 kg)

## 2013-08-06 NOTE — Assessment & Plan Note (Signed)
Nathan Baird is doing well. He was able to play basketball for an hour or so recently without problems. His left ventricular systolic function continues to gradually improved on medical therapy.   His ejection fraction is around 35% now. We will increase his losartan 100 mg a day. I'll see him back in one to 2 months. We will add spironolactone at that time.  I've advised him to avoid eating any extra salt. I am pleased that  feeling so well. At this point, I do not think that we need to consider an ICD since his left ventricular ejection fraction is greater than 35%.

## 2013-08-06 NOTE — Progress Notes (Signed)
Nathan Simonsony J Baird Date of Birth  1980/09/13       [facility]    [place] [facility] [address]  [address] [place], [place]  [address]   [place], [place]  [id number] [phone]     [phone]   Fax  [phone]    Fax [phone]  Problem List: 1. Congestive heart failure-ejection fraction of 20-25% by echo 2. Restrictive lung disease 3. CVA  History of Present Illness:  Nathan Baird is a 33 year old gentleman who is a new consultation for further evaluation of his cough. He had an echocardiogram and was found to have ejection fraction of between 20 and 25%. I started him on Lasix 40 mg a day as well as potassium chloride 20 mg a day. He developed severe orthostatic hypotension and felt very poorly after taking one dose Lasix and he stopped.  Surprisingly, he does not have much shortness of breath. His basketball on a regular basis. He is able to play for hours at a time and does not have any significant shortness of breath. He does have a cough. He's tried a Z-Pak which cleared up the greenish color of his sputum but he is still having this cough.  He denies any PND orthopnea. He denies any syncope or presyncope. He denies any chest pain. He does not have any cardiac history.  Dec 02, 2012:  Nathan Baird is feeling a bit better. His cough has improved. He still has episodes of profound fatigue.  His breathing is better.  He has greatly reduced his salt intake.  He still plays basket ball on a regular basis.   March 10, 2013:  Nov. 3, 2014:  Nathan Baird has gained some weight since his last ov.   He complains of a slight head ache / dizziness.   He has occasional episodes of orthostasis.   Otherwise, he is feeling well - breathing well.    Jan. 30, 2015:  Nathan Baird is doing OK.   He played basketball 2 weeks ago and felt great.  He was able to play without dyspnea.   His most recent echocardiogram in November revealed improvement of his left ventricular  systolic function. The official reading on the echo reports an ejection fraction of 30%  Reviewed the echo and I think that his ejection fraction is actually a little bit better- at least 35%.  In one view, the Simpson calculation reported 42%.    Current Outpatient Prescriptions on File Prior to Visit  Medication Sig Dispense Refill  . benzonatate (TESSALON) 100 MG capsule Take 2 capsules (200 mg total) by mouth 2 (two) times daily as needed for cough.  30 capsule  1  . carvedilol (COREG) 12.5 MG tablet Take 1  tablets by mouth twice daily, 12 hours apart  60 tablet  6  . Rivaroxaban (XARELTO) 20 MG TABS tablet Take 1 tablet (20 mg total) by mouth daily.  30 tablet  6   No current facility-administered medications on file prior to visit.    No Known Allergies  Past Medical History  Diagnosis Date  . Cough     a. PFTS 09/2012: restriction probable, further examinations recommended. Saw pulm 11/2012: placed on GERD rx and short course prednisone, planned to regroup.  . LV dysfunction     a. Identified 10/2012 by echo EF 20-25%. b. Echo 01/2013: EF 20-25%, TEE EF 15% with severe RV dysfunction as well.  [name]. History of palpitations  2x/month for several years feels heart racing  . CVA (cerebral vascular accident) 01/23/2013    tPA administered; MRI brain showed small acute R MCA infarct  . PFO (patent foramen ovale)     a. By TEE 01/2013.  [name] CHF (congestive heart failure)     Past Surgical History  Procedure Laterality Date  . No past surgeries    . Tee without cardioversion N/A 01/26/2013    Procedure: TRANSESOPHAGEAL ECHOCARDIOGRAM (TEE);  Surgeon: [name], MD;  Location: [facility] ENDOSCOPY;  Service: Cardiovascular;  Laterality: N/A;    History  Smoking status  . Never Smoker   Smokeless tobacco  . Not on file    History  Alcohol Use No    Family History  Problem Relation Age of Onset  . Sarcoidosis Mother   . Heart Problems Mother     PPM implantation age 64     Reviw of Systems:  Reviewed in the HPI.  All other systems are negative.  Physical Exam: Blood pressure 110/90, pulse 66, height 6\' 2"  (1.88 m), weight 254 lb 12.8 oz (115.577 kg).  Wt Readings from Last 3 Encounters:  08/06/13 254 lb 12.8 oz (115.577 kg)  06/14/13 248 lb (112.492 kg)  05/10/13 243 lb (110.224 kg)    General: Well developed, well nourished, in no acute distress.  Head: Normocephalic, atraumatic, sclera non-icteric, mucus membranes are moist,   Neck: Supple. Carotids are 2 + without bruits. No JVD   Lungs: Clear   Heart: Regular rate S1-S2. No S3 gallop.  Abdomen: Soft, non-tender, non-distended with normal bowel sounds.  Msk:  Strength and tone are normal   Extremities: No clubbing or cyanosis. No edema.  Distal pedal pulses are 2+ and equal   Neuro: CN II - XII intact.  Alert and oriented X 3.   Psych:  Normal   ECG:  Assessment / Plan:

## 2013-08-06 NOTE — Patient Instructions (Addendum)
Your physician recommends that you return for lab work in: today bmet bnp  Your physician has recommended you make the following change in your medication:  Increase losartan to 100 mg daily  Your physician recommends that you schedule a follow-up appointment in: 1-2 months

## 2013-08-10 ENCOUNTER — Encounter: Payer: Self-pay | Admitting: Cardiovascular Disease

## 2013-08-23 ENCOUNTER — Telehealth: Payer: Self-pay | Admitting: Cardiovascular Disease

## 2013-08-23 ENCOUNTER — Encounter (HOSPITAL_BASED_OUTPATIENT_CLINIC_OR_DEPARTMENT_OTHER): Payer: Self-pay | Admitting: Emergency Medicine

## 2013-08-23 ENCOUNTER — Emergency Department (HOSPITAL_BASED_OUTPATIENT_CLINIC_OR_DEPARTMENT_OTHER)
Admission: EM | Admit: 2013-08-23 | Discharge: 2013-08-23 | Disposition: A | Discharge status: Home / Self Care | Payer: 59 | Attending: Emergency Medicine | Admitting: Emergency Medicine

## 2013-08-23 DIAGNOSIS — I509 Heart failure, unspecified: Secondary | ICD-10-CM | POA: Insufficient documentation

## 2013-08-23 DIAGNOSIS — Z8673 Personal history of transient ischemic attack (TIA), and cerebral infarction without residual deficits: Secondary | ICD-10-CM | POA: Insufficient documentation

## 2013-08-23 DIAGNOSIS — R300 Dysuria: Secondary | ICD-10-CM

## 2013-08-23 DIAGNOSIS — Z79899 Other long term (current) drug therapy: Secondary | ICD-10-CM | POA: Insufficient documentation

## 2013-08-23 DIAGNOSIS — R369 Urethral discharge, unspecified: Secondary | ICD-10-CM

## 2013-08-23 DIAGNOSIS — Z87798 Personal history of other (corrected) congenital malformations: Secondary | ICD-10-CM | POA: Insufficient documentation

## 2013-08-23 DIAGNOSIS — Z7901 Long term (current) use of anticoagulants: Secondary | ICD-10-CM | POA: Insufficient documentation

## 2013-08-23 LAB — URINALYSIS, ROUTINE W REFLEX MICROSCOPIC
BILIRUBIN URINE: NEGATIVE
GLUCOSE, UA: NEGATIVE mg/dL
KETONES UR: NEGATIVE mg/dL
Nitrite: NEGATIVE
PH: 6 (ref 5.0–8.0)
PROTEIN: NEGATIVE mg/dL
Specific Gravity, Urine: 1.015 (ref 1.005–1.030)
Urobilinogen, UA: 1 mg/dL (ref 0.0–1.0)

## 2013-08-23 LAB — URINE MICROSCOPIC-ADD ON

## 2013-08-23 MED ORDER — AZITHROMYCIN 250 MG PO TABS
1000.0000 mg | ORAL_TABLET | Freq: Once | ORAL | Status: AC
  Administered 2013-08-23: 1000 mg via ORAL
  Filled 2013-08-23: qty 4

## 2013-08-23 MED ORDER — CEFTRIAXONE SODIUM 250 MG IJ SOLR
250.0000 mg | Freq: Once | INTRAMUSCULAR | Status: AC
  Administered 2013-08-23: 250 mg via INTRAMUSCULAR
  Filled 2013-08-23: qty 250

## 2013-08-23 NOTE — Discharge Instructions (Signed)
We have treated you with antibiotics to cover Gonorrhea and Chlamydia. You should go to the Health Department for further screening for HIV, Hepatitis and Syphilis. We will call you if your cultures are positive. Return as needed for problems.  BE CAREFUL DRIVING IN THE SNOW AND HAVE A GOOD DAY.

## 2013-08-23 NOTE — Telephone Encounter (Signed)
New message   Wife calling trying to get a note for work .  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 Need a detail notes stating this for his work.

## 2013-08-23 NOTE — Telephone Encounter (Signed)
I will forward to Dr Elease Hashimoto to ask for advice.

## 2013-08-23 NOTE — ED Provider Notes (Signed)
CSN: [id number]     Arrival date & time 08/23/13  1539 History   First MD Initiated Contact with Patient 08/23/13 1606     No chief complaint on file.    (Consider location/radiation/quality/duration/timing/severity/associated sxs/prior Treatment) Patient is a 33 y.o. male presenting with penile discharge. The history is provided by the patient.  Penile Discharge This is a new problem. The current episode started today. The problem occurs constantly. The problem has been gradually worsening.   Nathan Baird is a 33 y.o. male who presents to the ED with dysuria and yellow urethral discharge. He is sexually active and usually uses condoms and also has oral sex. He states he is sure he has GC and request treatment. He denies fever, chills, abdominal pain or any other problems.  Past Medical History  Diagnosis Date  . Cough     a. PFTS 09/2012: restriction probable, further examinations recommended. Saw pulm 11/2012: placed on GERD rx and short course prednisone, planned to regroup.  . LV dysfunction     a. Identified 10/2012 by echo EF 20-25%. b. Echo 01/2013: EF 20-25%, TEE EF 15% with severe RV dysfunction as well.  [name] History of palpitations     2x/month for several years feels heart racing  . CVA (cerebral vascular accident) 01/23/2013    tPA administered; MRI brain showed small acute R MCA infarct  . PFO (patent foramen ovale)     a. By TEE 01/2013.  [name] CHF (congestive heart failure)    Past Surgical History  Procedure Laterality Date  . No past surgeries    . Tee without cardioversion N/A 01/26/2013    Procedure: TRANSESOPHAGEAL ECHOCARDIOGRAM (TEE);  Surgeon: [name], MD;  Location: [facility] ENDOSCOPY;  Service: Cardiovascular;  Laterality: N/A;   Family History  Problem Relation Age of Onset  . Sarcoidosis Mother   . Heart Problems Mother     PPM implantation age 70   History  Substance Use Topics  . Smoking status: Never Smoker   . Smokeless tobacco: Not on file  . Alcohol  Use: No    Review of Systems  Negative except as stated in HPI    Allergies  Review of patient's allergies indicates no known allergies.  Home Medications   Current Outpatient Rx  Name  Route  Sig  Dispense  Refill  . benzonatate (TESSALON) 100 MG capsule   Oral   Take 2 capsules (200 mg total) by mouth 2 (two) times daily as needed for cough.   30 capsule   1   . carvedilol (COREG) 12.5 MG tablet      Take 1  tablets by mouth twice daily, 12 hours apart   60 tablet   6     MED INCREASED   . losartan (COZAAR) 100 MG tablet   Oral   Take 1 tablet (100 mg total) by mouth daily.   30 tablet   6     increased   . Rivaroxaban (XARELTO) 20 MG TABS tablet   Oral   Take 1 tablet (20 mg total) by mouth daily.   30 tablet   6    BP 126/82  Pulse 86  Temp(Src) 98.2 F (36.8 C) (Oral)  Resp 18  Ht 6\' 2"  (1.88 m)  Wt 254 lb (115.214 kg)  BMI 32.60 kg/m2  SpO2 98% Physical Exam  Nursing note and vitals reviewed. Constitutional: He is oriented to person, place, and time. He appears well-developed and well-nourished. No distress.  HENT:  Head: Normocephalic.  Eyes: EOM are normal.  Neck: Neck supple.  Cardiovascular: Normal rate.   Pulmonary/Chest: Effort normal.  Abdominal: Soft. There is no tenderness.  Genitourinary: Testes normal. Circumcised. Discharge found.  Musculoskeletal: Normal range of motion.  Lymphadenopathy:       Right: Inguinal adenopathy present.       Left: No inguinal adenopathy present.  Neurological: He is alert and oriented to person, place, and time. No cranial nerve deficit.  Skin: Skin is warm and dry.  Psychiatric: He has a normal mood and affect. His behavior is normal.    ED Course  Procedures (including critical care time) Labs Review MDM  Will treat with Rocephin 250 mg IM and Zithromax 1 gram PO while cultures pending. Discussed plan of care with the patient. He will follow up with the health department for additional  screening for HIV, syphilis and Hepatitis. He will return here as needed.     [facility] Orlene OchM Neese, [place] 08/24/13 1910

## 2013-08-23 NOTE — Telephone Encounter (Signed)
Pt has letter already his wife feels that will work//see letters written prior. They may call back if need additional copy.

## 2013-08-23 NOTE — ED Notes (Signed)
Penile discharge and burning. States he knows for a fact he has GC.

## 2013-08-24 LAB — GC/CHLAMYDIA PROBE AMP
CT PROBE, AMP APTIMA: NEGATIVE
GC PROBE AMP APTIMA: POSITIVE — AB

## 2013-08-24 NOTE — ED Provider Notes (Signed)
Medical screening examination/treatment/procedure(s) were performed by non-physician practitioner and as supervising physician I was immediately available for consultation/collaboration.  EKG Interpretation   None         Rolan Bucco, MD 08/24/13 1920

## 2013-08-25 NOTE — ED Notes (Signed)
+   Gonorrhea-Patient treated with Rocephin And Zithromax-[facility] faxed. Patient informed of positive results after id'd x 2 and informed of need to notify partner to be treated.

## 2013-09-13 ENCOUNTER — Ambulatory Visit: Payer: 59 | Admitting: Cardiovascular Disease

## 2013-10-08 ENCOUNTER — Encounter: Payer: Self-pay | Admitting: Cardiovascular Disease

## 2013-10-08 ENCOUNTER — Ambulatory Visit (INDEPENDENT_AMBULATORY_CARE_PROVIDER_SITE_OTHER): Payer: 59 | Admitting: Cardiovascular Disease

## 2013-10-08 VITALS — BP 110/82 | HR 76 | Ht 75.0 in | Wt 246.0 lb

## 2013-10-08 DIAGNOSIS — I509 Heart failure, unspecified: Secondary | ICD-10-CM

## 2013-10-08 DIAGNOSIS — I5022 Chronic systolic (congestive) heart failure: Secondary | ICD-10-CM

## 2013-10-08 MED ORDER — SPIRONOLACTONE 25 MG PO TABS: 25.0000 mg | ORAL_TABLET | Freq: Every day | ORAL | Status: DC

## 2013-10-08 NOTE — Patient Instructions (Signed)
Your physician has recommended you make the following change in your medication:  Start aldactone 25 mg daily  Your physician recommends that you return for lab work in:  bmet 1 week bmet 1 month Your physician recommends that you schedule a follow-up appointment in: 1 month

## 2013-10-08 NOTE — Assessment & Plan Note (Signed)
He seems to be doing okay. He did not tolerate the most recent increase in losartan. We'll continue with the current dose of losartan and carvedilol. We will add spironolactone 25 mg a day. We'll check a basic metabolic profile in one week and again in one month or as otherwise directed by the pharmacist protocol.   I'll see him again in one month for followup visit. Once his medical regimen is stable will recheck an echocardiogram. If the echo still shows that his ejection fraction is below 35%, we will refer him to the electrophysiologist for consideration for an ICD.

## 2013-10-08 NOTE — Progress Notes (Signed)
Alger Simonsony J Reitz Date of Birth  11-20-1980       [facility]    [place] [facility] [address]  [address] [place], [place]  [address]   [place], [place]  [id number] [phone]     [phone]   Fax  [phone]    Fax [phone]  Problem List: 1. Congestive heart failure-ejection fraction of 20-25% by echo 2. Restrictive lung disease 3. CVA  History of Present Illness:  Nathan Baird is a 33 year old gentleman who is a new consultation for further evaluation of his cough. He had an echocardiogram and was found to have ejection fraction of between 20 and 25%. I started him on Lasix 40 mg a day as well as potassium chloride 20 mg a day. He developed severe orthostatic hypotension and felt very poorly after taking one dose Lasix and he stopped.  Surprisingly, he does not have much shortness of breath. His basketball on a regular basis. He is able to play for hours at a time and does not have any significant shortness of breath. He does have a cough. He's tried a Z-Pak which cleared up the greenish color of his sputum but he is still having this cough.  He denies any PND orthopnea. He denies any syncope or presyncope. He denies any chest pain. He does not have any cardiac history.  Dec 02, 2012:  Nathan Baird is feeling a bit better. His cough has improved. He still has episodes of profound fatigue.  His breathing is better.  He has greatly reduced his salt intake.  He still plays basket ball on a regular basis.   March 10, 2013:  Nov. 3, 2014:  Nathan Baird has gained some weight since his last ov.   He complains of a slight head ache / dizziness.   He has occasional episodes of orthostasis.   Otherwise, he is feeling well - breathing well.    Jan. 30, 2015:  Nathan Baird is doing OK.   He played basketball 2 weeks ago and felt great.  He was able to play without dyspnea.   His most recent echocardiogram in November revealed improvement of his left ventricular  systolic function. The official reading on the echo reports an ejection fraction of 30%  Reviewed the echo and I think that his ejection fraction is actually a little bit better- at least 35%.  In one view, the Simpson calculation reported 42%.  October 08, 2013:  Nathan Baird seems to be doing fairly well. We increased his losartan to 100 mg a day during his last visit but he started having episodes of lightheadedness any developed a headache. The symptoms resolved when he decreased his dose back down to the 50 mg where we were previously.  Current Outpatient Prescriptions on File Prior to Visit  Medication Sig Dispense Refill  . carvedilol (COREG) 12.5 MG tablet Take 1  tablets by mouth twice daily, 12 hours apart  60 tablet  6  . Rivaroxaban (XARELTO) 20 MG TABS tablet Take 1 tablet (20 mg total) by mouth daily.  30 tablet  6   No current facility-administered medications on file prior to visit.    No Known Allergies  Past Medical History  Diagnosis Date  . Cough     a. PFTS 09/2012: restriction probable, further examinations recommended. Saw pulm 11/2012: placed on GERD rx and short course prednisone, planned to regroup.  . LV dysfunction     a. Identified 10/2012 by echo EF  20-25%. b. Echo 01/2013: EF 20-25%, TEE EF 15% with severe RV dysfunction as well.  [name] History of palpitations     2x/month for several years feels heart racing  . CVA (cerebral vascular accident) 01/23/2013    tPA administered; MRI brain showed small acute R MCA infarct  . PFO (patent foramen ovale)     a. By TEE 01/2013.  [name] CHF (congestive heart failure)     Past Surgical History  Procedure Laterality Date  . No past surgeries    . Tee without cardioversion N/A 01/26/2013    Procedure: TRANSESOPHAGEAL ECHOCARDIOGRAM (TEE);  Surgeon: [name], MD;  Location: [facility] ENDOSCOPY;  Service: Cardiovascular;  Laterality: N/A;    History  Smoking status  . Never Smoker   Smokeless tobacco  . Not on file    History   Alcohol Use No    Family History  Problem Relation Age of Onset  . Sarcoidosis Mother   . Heart Problems Mother     PPM implantation age 68    Reviw of Systems:  Reviewed in the HPI.  All other systems are negative.  Physical Exam: Blood pressure 110/82, pulse 76, height 6\' 3"  (1.905 m), weight 246 lb (111.585 kg).  Wt Readings from Last 3 Encounters:  10/08/13 246 lb (111.585 kg)  08/23/13 254 lb (115.214 kg)  08/06/13 254 lb 12.8 oz (115.577 kg)    General: Well developed, well nourished, in no acute distress.  Head: Normocephalic, atraumatic, sclera non-icteric, mucus membranes are moist,   Neck: Supple. Carotids are 2 + without bruits. No JVD   Lungs: Clear   Heart: Regular rate S1-S2. No S3 gallop.  Abdomen: Soft, non-tender, non-distended with normal bowel sounds.  Msk:  Strength and tone are normal   Extremities: No clubbing or cyanosis. No edema.  Distal pedal pulses are 2+ and equal   Neuro: CN II - XII intact.  Alert and oriented X 3.   Psych:  Normal   ECG: 10/08/2013: Sinus rhythm with occasional premature ventricular contractions. His heart rate is 76. His T-wave inversions in the inferior leads and lateral leads. EKG is unchanged from previous tracings. Assessment / Plan:

## 2013-10-11 ENCOUNTER — Telehealth: Payer: Self-pay | Admitting: Pharmacist

## 2013-10-11 NOTE — Telephone Encounter (Signed)
Message copied by Lou Miner on Mon Oct 11, 2013  2:22 PM ------      Message from: Antony Odea      Created: Fri Oct 08, 2013  5:03 PM      Regarding: aldactone        Started him on aldactone today, ordered bmet in 1 week and 1 month.      jodette  ------

## 2013-10-11 NOTE — Telephone Encounter (Signed)
Patient contacted and discussed spironolactone use with him.  Discussed the benefits of spironolactone in CHF, and possible side effects that could occur.  Explained the importance of getting potassium levels checked, and he understands to get a BMET on 10/15/13 as scheduled, and more frequent levels may be needed if his K+ fluctuates.  Will call patient following BMET on 10/15/13.

## 2013-10-15 ENCOUNTER — Telehealth: Payer: Self-pay | Admitting: Pharmacist

## 2013-10-15 ENCOUNTER — Other Ambulatory Visit: Payer: 59

## 2013-10-15 NOTE — Telephone Encounter (Signed)
Patient missed his BMET today.  He started on spironolactone 1 week ago.  Attempted to call patient to remind him again to get this done.  Unable to leave message, so will attempt to call patient again Monday to come in for BMET.

## 2013-10-18 ENCOUNTER — Other Ambulatory Visit: Payer: 59

## 2013-10-18 NOTE — Telephone Encounter (Signed)
Unable to get through to patient after multiple attempts.  Spoke with patient's mother (emergency contact), who tells me she will have patient come in today for blood work.  Lab appointment set up.

## 2013-10-20 ENCOUNTER — Other Ambulatory Visit: Payer: Self-pay

## 2013-10-20 NOTE — Telephone Encounter (Signed)
Finally got through to patient and he agrees to come in for lab today - BMET given recent spironolactone start.  Lab encounter created.

## 2013-10-21 ENCOUNTER — Other Ambulatory Visit (INDEPENDENT_AMBULATORY_CARE_PROVIDER_SITE_OTHER): Payer: 59

## 2013-10-21 DIAGNOSIS — I5022 Chronic systolic (congestive) heart failure: Secondary | ICD-10-CM

## 2013-10-21 DIAGNOSIS — I509 Heart failure, unspecified: Secondary | ICD-10-CM

## 2013-10-21 NOTE — Telephone Encounter (Signed)
Patient still didn't keep appointment.  Call and left a message stating importance of keeping appointment. Hopefully will come in today or tomorrow morning for BMET.

## 2013-10-22 LAB — BASIC METABOLIC PANEL
BUN: 16 mg/dL (ref 6–23)
CO2: 27 meq/L (ref 19–32)
Calcium: 9.4 mg/dL (ref 8.4–10.5)
Chloride: 106 mEq/L (ref 96–112)
Creatinine, Ser: 1.1 mg/dL (ref 0.4–1.5)
GFR: 100.77 mL/min (ref >60.00)
Glucose, Bld: 101 mg/dL — ABNORMAL HIGH (ref 70–99)
POTASSIUM: 3.7 meq/L (ref 3.5–5.1)
SODIUM: 139 meq/L (ref 135–145)

## 2013-10-22 NOTE — Telephone Encounter (Signed)
Patient did come in for BMET yesterday (10/21/13) and blood work was normal.  K was 3.7 and Scr was 1.1, both which are stable.  Patient advised to continue spironolactone 25 mg qd which he started two weeks ago, and to keep his appointment with Dr. Elease Hashimoto in 2 and 1/2 weeks on 11/09/13.  He is due for a repeat BMET at that time.

## 2013-11-09 ENCOUNTER — Encounter: Payer: Self-pay | Admitting: Cardiovascular Disease

## 2013-11-09 ENCOUNTER — Ambulatory Visit (INDEPENDENT_AMBULATORY_CARE_PROVIDER_SITE_OTHER): Payer: 59 | Admitting: Cardiovascular Disease

## 2013-11-09 ENCOUNTER — Other Ambulatory Visit (INDEPENDENT_AMBULATORY_CARE_PROVIDER_SITE_OTHER): Payer: 59

## 2013-11-09 VITALS — BP 125/85 | HR 86 | Ht 74.0 in | Wt 252.0 lb

## 2013-11-09 DIAGNOSIS — I6340 Cerebral infarction due to embolism of unspecified cerebral artery: Secondary | ICD-10-CM

## 2013-11-09 DIAGNOSIS — I509 Heart failure, unspecified: Secondary | ICD-10-CM

## 2013-11-09 DIAGNOSIS — I5022 Chronic systolic (congestive) heart failure: Secondary | ICD-10-CM

## 2013-11-09 DIAGNOSIS — I639 Cerebral infarction, unspecified: Secondary | ICD-10-CM

## 2013-11-09 MED ORDER — CARVEDILOL 25 MG PO TABS: 25.0000 mg | ORAL_TABLET | Freq: Two times a day (BID) | ORAL | Status: DC

## 2013-11-09 NOTE — Patient Instructions (Signed)
Increase Coreg 25 mg twice a day  Your physician recommends that you schedule a follow-up appointment in: 1 month   May return to work without restrictions

## 2013-11-09 NOTE — Assessment & Plan Note (Signed)
Nathan Baird  seems to be making improvements. His left ventricular systolic function has already improved from last year. We continued to be able to up titrate his medications. We'll increase his carvedilol to 25 mg twice a day. I anticipate doing an echocardiogram in about 6 weeks. My hope is that his ejection fraction is greater than 35% so that he will not need a defibrillator.  He's back doing most of his normal activities at work. We will write a letter to that he can return to work with no restrictions.    I'll see him again in one month for followup office visit.

## 2013-11-09 NOTE — Assessment & Plan Note (Signed)
No further signs of CVA.  Continue Xarelto.

## 2013-11-09 NOTE — Progress Notes (Signed)
Nathan Baird J Granquist Date of Birth  [birth year]       [facility]    [place] [facility] [address]  [address] [place]  [address]   [place]  [address] [phone]     [phone]   Fax  [phone]    Fax [phone]  Problem List: 1. Congestive heart failure-ejection fraction of 20-25% by echo 2. Restrictive lung disease 3. CVA  History of Present Illness:  Nathan Baird is a 33 year old gentleman who is a new consultation for further evaluation of his cough. He had an echocardiogram and was found to have ejection fraction of between 20 and 25%. I started him on Lasix 40 mg a day as well as potassium chloride 20 mg a day. He developed severe orthostatic hypotension and felt very poorly after taking one dose Lasix and he stopped.  Surprisingly, he does not have much shortness of breath. His basketball on a regular basis. He is able to play for hours at a time and does not have any significant shortness of breath. He does have a cough. He's tried a Z-Pak which cleared up the greenish color of his sputum but he is still having this cough.  He denies any PND orthopnea. He denies any syncope or presyncope. He denies any chest pain. He does not have any cardiac history.  Dec 02, 2012:  Nathan Baird is feeling a bit better. His cough has improved. He still has episodes of profound fatigue.  His breathing is better.  He has greatly reduced his salt intake.  He still plays basket ball on a regular basis.   March 10, 2013:  Nov. 3, 2014:  Nathan Baird has gained some weight since his last ov.   He complains of a slight head ache / dizziness.   He has occasional episodes of orthostasis.   Otherwise, he is feeling well - breathing well.    Jan. 30, 2015:  Nathan Baird is doing OK.   He played basketball 2 weeks ago and felt great.  He was able to play without dyspnea.   His most recent echocardiogram in November revealed improvement of his left ventricular  systolic function. The official reading on the echo reports an ejection fraction of 30%  Reviewed the echo and I think that his ejection fraction is actually a little bit better- at least 35%.  In one view, the Simpson calculation reported 42%.  October 08, 2013:  Nathan Baird seems to be doing fairly well. We increased his losartan to 100 mg a day during his last visit but he started having episodes of lightheadedness any developed a headache. The symptoms resolved when he decreased his dose back down to the 50 mg where we were previously.  Nov 09, 2013:  Nathan Baird is doing ok.    Current Outpatient Prescriptions on File Prior to Visit  Medication Sig Dispense Refill  . carvedilol (COREG) 12.5 MG tablet Take 1  tablets by mouth twice daily, 12 hours apart  60 tablet  6  . losartan (COZAAR) 50 MG tablet Take 50 mg by mouth daily.      . Rivaroxaban (XARELTO) 20 MG TABS tablet Take 1 tablet (20 mg total) by mouth daily.  30 tablet  6  . spironolactone (ALDACTONE) 25 MG tablet Take 1 tablet (25 mg total) by mouth daily.  30 tablet  3   No current facility-administered medications on file prior to visit.    Not on File  Past Medical History  Diagnosis Date  . Cough     a. PFTS 09/2012: restriction probable, further examinations recommended. Saw pulm 11/2012: placed on GERD rx and short course prednisone, planned to regroup.  . LV dysfunction     a. Identified 10/2012 by echo EF 20-25%. b. Echo 01/2013: EF 20-25%, TEE EF 15% with severe RV dysfunction as well.  Nathan Baird History of palpitations     2x/month for several years feels heart racing  . CVA (cerebral vascular accident) 01/23/2013    tPA administered; MRI brain showed small acute R MCA infarct  . PFO (patent foramen ovale)     a. By TEE 01/2013.  Nathan Baird CHF (congestive heart failure)     Past Surgical History  Procedure Laterality Date  . No past surgeries    . Tee without cardioversion N/A 01/26/2013    Procedure: TRANSESOPHAGEAL ECHOCARDIOGRAM (TEE);   Surgeon: [name], MD;  Location: [facility] ENDOSCOPY;  Service: Cardiovascular;  Laterality: N/A;    History  Smoking status  . Never Smoker   Smokeless tobacco  . Not on file    History  Alcohol Use No    Family History  Problem Relation Age of Onset  . Sarcoidosis Mother   . Heart Problems Mother     PPM implantation age 49    Reviw of Systems:  Reviewed in the HPI.  All other systems are negative.  Physical Exam: Blood pressure 125/85, pulse 86, height 6\' 2"  (1.88 m), weight 252 lb (114.306 kg).  Wt Readings from Last 3 Encounters:  11/09/13 252 lb (114.306 kg)  10/08/13 246 lb (111.585 kg)  08/23/13 254 lb (115.214 kg)    General: Well developed, well nourished, in no acute distress.  Head: Normocephalic, atraumatic, sclera non-icteric, mucus membranes are moist,   Neck: Supple. Carotids are 2 + without bruits. No JVD   Lungs: Clear   Heart: Regular rate S1-S2. No S3 gallop.  Abdomen: Soft, non-tender, non-distended with normal bowel sounds.  Msk:  Strength and tone are normal   Extremities: No clubbing or cyanosis. No edema.  Distal pedal pulses are 2+ and equal   Neuro: CN II - XII intact.  Alert and oriented X 3.   Psych:  Normal   ECG: 10/08/2013: Sinus rhythm with occasional premature ventricular contractions. His heart rate is 76. His T-wave inversions in the inferior leads and lateral leads. EKG is unchanged from previous tracings. Assessment / Plan:

## 2013-11-10 ENCOUNTER — Telehealth: Payer: Self-pay | Admitting: Pharmacist

## 2013-11-10 DIAGNOSIS — Z79899 Other long term (current) drug therapy: Secondary | ICD-10-CM

## 2013-11-10 LAB — BASIC METABOLIC PANEL
BUN: 16 mg/dL (ref 6–23)
CHLORIDE: 103 meq/L (ref 96–112)
CO2: 28 mEq/L (ref 19–32)
Calcium: 9.8 mg/dL (ref 8.4–10.5)
Creatinine, Ser: 1.2 mg/dL (ref 0.4–1.5)
GFR: 86.81 mL/min (ref >60.00)
Glucose, Bld: 82 mg/dL (ref 70–99)
POTASSIUM: 3.9 meq/L (ref 3.5–5.1)
Sodium: 139 mEq/L (ref 135–145)

## 2013-11-10 NOTE — Telephone Encounter (Signed)
Patient notified that BMET yesterday was normal, and his potassium and serum creatinine are stable since starting spironolactone 1 months ago.  Will get one more BMET in 4 weeks when he sees Dr. Elease Hashimoto, then likely 3 months after that if still normal.

## 2013-12-10 ENCOUNTER — Other Ambulatory Visit (INDEPENDENT_AMBULATORY_CARE_PROVIDER_SITE_OTHER): Payer: 59

## 2013-12-10 ENCOUNTER — Ambulatory Visit (INDEPENDENT_AMBULATORY_CARE_PROVIDER_SITE_OTHER): Payer: 59 | Admitting: Cardiovascular Disease

## 2013-12-10 ENCOUNTER — Encounter: Payer: Self-pay | Admitting: Cardiovascular Disease

## 2013-12-10 VITALS — BP 100/82 | HR 86 | Ht 74.0 in | Wt 250.0 lb

## 2013-12-10 DIAGNOSIS — Z79899 Other long term (current) drug therapy: Secondary | ICD-10-CM

## 2013-12-10 DIAGNOSIS — I5022 Chronic systolic (congestive) heart failure: Secondary | ICD-10-CM

## 2013-12-10 DIAGNOSIS — I509 Heart failure, unspecified: Secondary | ICD-10-CM

## 2013-12-10 LAB — BASIC METABOLIC PANEL
BUN: 19 mg/dL (ref 6–23)
CO2: 29 meq/L (ref 19–32)
Calcium: 10.5 mg/dL (ref 8.4–10.5)
Chloride: 105 mEq/L (ref 96–112)
Creatinine, Ser: 1.5 mg/dL (ref 0.4–1.5)
GFR: 72.43 mL/min (ref >60.00)
GLUCOSE: 78 mg/dL (ref 70–99)
POTASSIUM: 4.2 meq/L (ref 3.5–5.1)
Sodium: 141 mEq/L (ref 135–145)

## 2013-12-10 NOTE — Progress Notes (Signed)
Nathan Baird Date of Birth  Apr 18, 1981       [facility]    [place] [address]  [address] [place], [place]  [address]   [place], [place]  [address] [phone]     [phone]   Fax  [phone]    Fax [phone]  Problem List: 1. Congestive heart failure-ejection fraction of 20-25% by echo 2. Restrictive lung disease 3. CVA  History of Present Illness:  Nathan Baird is a 33 year old gentleman who is a new consultation for further evaluation of his cough. He had an echocardiogram and was found to have ejection fraction of between 20 and 25%. I started him on Lasix 40 mg a day as well as potassium chloride 20 mg a day. He developed severe orthostatic hypotension and felt very poorly after taking one dose Lasix and he stopped.  Surprisingly, he does not have much shortness of breath. His basketball on a regular basis. He is able to play for hours at a time and does not have any significant shortness of breath. He does have a cough. He's tried a Z-Pak which cleared up the greenish color of his sputum but he is still having this cough.  He denies any PND orthopnea. He denies any syncope or presyncope. He denies any chest pain. He does not have any cardiac history.  Dec 02, 2012:  Nathan Baird is feeling a bit better. His cough has improved. He still has episodes of profound fatigue.  His breathing is better.  He has greatly reduced his salt intake.  He still plays basket ball on a regular basis.   March 10, 2013:  Nov. 3, 2014:  Nathan Baird has gained some weight since his last ov.   He complains of a slight head ache / dizziness.   He has occasional episodes of orthostasis.   Otherwise, he is feeling well - breathing well.    Jan. 30, 2015:  Nathan Baird is doing OK.   He played basketball 2 weeks ago and felt great.  He was able to play without dyspnea.   His most recent echocardiogram in November revealed improvement of his left ventricular  systolic function. The official reading on the echo reports an ejection fraction of 30%  Reviewed the echo and I think that his ejection fraction is actually a little bit better- at least 35%.  In one view, the Simpson calculation reported 42%.  October 08, 2013:  Nathan Baird seems to be doing fairly well. We increased his losartan to 100 mg a day during his last visit but he started having episodes of lightheadedness any developed a headache. The symptoms resolved when he decreased his dose back down to the 50 mg where we were previously.  Nov 09, 2013:  Nathan Baird is doing ok.   December 10, 2013:   Nathan Baird is   Current Outpatient Prescriptions on File Prior to Visit  Medication Sig Dispense Refill  . carvedilol (COREG) 25 MG tablet Take 1 tablet (25 mg total) by mouth 2 (two) times daily.  60 tablet  6  . losartan (COZAAR) 50 MG tablet Take 50 mg by mouth daily.      . Rivaroxaban (XARELTO) 20 MG TABS tablet Take 1 tablet (20 mg total) by mouth daily.  30 tablet  6  . spironolactone (ALDACTONE) 25 MG tablet Take 1 tablet (25 mg total) by mouth daily.  30 tablet  3   No current facility-administered medications on file prior  to visit.    Not on File  Past Medical History  Diagnosis Date  . Cough     a. PFTS 09/2012: restriction probable, further examinations recommended. Saw pulm 11/2012: placed on GERD rx and short course prednisone, planned to regroup.  . LV dysfunction     a. Identified 10/2012 by echo EF 20-25%. b. Echo 01/2013: EF 20-25%, TEE EF 15% with severe RV dysfunction as well.  [name]. History of palpitations     2x/month for several years feels heart racing  . CVA (cerebral vascular accident) 01/23/2013    tPA administered; MRI brain showed small acute R MCA infarct  . PFO (patent foramen ovale)     a. By TEE 01/2013.  [name]. CHF (congestive heart failure)     Past Surgical History  Procedure Laterality Date  . No past surgeries    . Tee without cardioversion N/A 01/26/2013    Procedure:  TRANSESOPHAGEAL ECHOCARDIOGRAM (TEE);  Surgeon: Lewayne BuntingBrian S Crenshaw, MD;  Location: [facility] ENDOSCOPY;  Service: Cardiovascular;  Laterality: N/A;    History  Smoking status  . Never Smoker   Smokeless tobacco  . Not on file    History  Alcohol Use No    Family History  Problem Relation Age of Onset  . Sarcoidosis Mother   . Heart Problems Mother     PPM implantation age 32    Reviw of Systems:  Reviewed in the HPI.  All other systems are negative.  Physical Exam: Blood pressure 100/82, pulse 86, height 6\' 2"  (1.88 m), weight 250 lb (113.399 kg).  Wt Readings from Last 3 Encounters:  12/10/13 250 lb (113.399 kg)  11/09/13 252 lb (114.306 kg)  10/08/13 246 lb (111.585 kg)    General: Well developed, well nourished, in no acute distress.  Head: Normocephalic, atraumatic, sclera non-icteric, mucus membranes are moist,   Neck: Supple. Carotids are 2 + without bruits. No JVD   Lungs: Clear   Heart: Regular rate S1-S2. No S3 gallop.  Abdomen: Soft, non-tender, non-distended with normal bowel sounds.  Msk:  Strength and tone are normal   Extremities: No clubbing or cyanosis. No edema.  Distal pedal pulses are 2+ and equal   Neuro: CN II - XII intact.  Alert and oriented X 3.   Psych:  Normal   ECG: 10/08/2013: Sinus rhythm with occasional premature ventricular contractions. His heart rate is 76. His T-wave inversions in the inferior leads and lateral leads. EKG is unchanged from previous tracings. Assessment / Plan:

## 2013-12-10 NOTE — Assessment & Plan Note (Signed)
Nathan Baird is doing very well.  He is back working full time.  He played 2 hours of basketball this afternoon without any problems.  We have gradually titrated his meds up.  Will get an echocardiogram for further evaluation. I'll see him again in 3 months for followup visit.  I have advised him to avoid salt.

## 2013-12-10 NOTE — Patient Instructions (Signed)
Schedule Echo     Your physician recommends that you schedule a follow-up appointment in: 3 months

## 2013-12-13 ENCOUNTER — Telehealth: Payer: Self-pay | Admitting: Cardiovascular Disease

## 2013-12-13 NOTE — Telephone Encounter (Signed)
Notified pt that his labs were normal per Dr Elease Hashimoto.  Pt verbalized understanding and pleased with this news.

## 2013-12-13 NOTE — Telephone Encounter (Signed)
New message ° ° ° ° °Returning a nurses call to get lab results °

## 2013-12-14 ENCOUNTER — Ambulatory Visit (HOSPITAL_COMMUNITY): Payer: Self-pay

## 2013-12-15 ENCOUNTER — Other Ambulatory Visit: Payer: Self-pay | Admitting: *Deleted

## 2013-12-15 MED ORDER — LOSARTAN POTASSIUM 50 MG PO TABS: 50.0000 mg | ORAL_TABLET | Freq: Every day | ORAL | Status: DC

## 2013-12-20 ENCOUNTER — Ambulatory Visit (HOSPITAL_COMMUNITY)
Admission: RE | Admit: 2013-12-20 | Discharge: 2013-12-20 | Disposition: A | Discharge status: Home / Self Care | Payer: 59 | Source: Ambulatory Visit | Attending: Cardiovascular Disease | Admitting: Cardiovascular Disease

## 2013-12-20 DIAGNOSIS — I5022 Chronic systolic (congestive) heart failure: Secondary | ICD-10-CM

## 2013-12-20 DIAGNOSIS — I428 Other cardiomyopathies: Secondary | ICD-10-CM | POA: Insufficient documentation

## 2013-12-20 DIAGNOSIS — I519 Heart disease, unspecified: Secondary | ICD-10-CM

## 2013-12-20 NOTE — Progress Notes (Signed)
2D Echo Performed 12/20/2013    Marialuisa Basara, RCS  

## 2014-01-18 ENCOUNTER — Ambulatory Visit: Payer: Self-pay | Admitting: Internal Medicine

## 2014-01-27 ENCOUNTER — Ambulatory Visit: Payer: Self-pay | Admitting: Internal Medicine

## 2014-02-26 ENCOUNTER — Emergency Department (HOSPITAL_BASED_OUTPATIENT_CLINIC_OR_DEPARTMENT_OTHER)
Admission: EM | Admit: 2014-02-26 | Discharge: 2014-02-26 | Disposition: A | Discharge status: Home / Self Care | Payer: 59 | Attending: Emergency Medicine | Admitting: Emergency Medicine

## 2014-02-26 ENCOUNTER — Encounter (HOSPITAL_BASED_OUTPATIENT_CLINIC_OR_DEPARTMENT_OTHER): Payer: Self-pay | Admitting: Emergency Medicine

## 2014-02-26 DIAGNOSIS — Z79899 Other long term (current) drug therapy: Secondary | ICD-10-CM | POA: Diagnosis not present

## 2014-02-26 DIAGNOSIS — K089 Disorder of teeth and supporting structures, unspecified: Secondary | ICD-10-CM | POA: Insufficient documentation

## 2014-02-26 DIAGNOSIS — Q2111 Secundum atrial septal defect: Secondary | ICD-10-CM | POA: Insufficient documentation

## 2014-02-26 DIAGNOSIS — I509 Heart failure, unspecified: Secondary | ICD-10-CM | POA: Diagnosis not present

## 2014-02-26 DIAGNOSIS — Z7901 Long term (current) use of anticoagulants: Secondary | ICD-10-CM | POA: Insufficient documentation

## 2014-02-26 DIAGNOSIS — K0889 Other specified disorders of teeth and supporting structures: Secondary | ICD-10-CM

## 2014-02-26 DIAGNOSIS — Q211 Atrial septal defect: Secondary | ICD-10-CM | POA: Insufficient documentation

## 2014-02-26 MED ORDER — HYDROCODONE-ACETAMINOPHEN 5-325 MG PO TABS: 1.0000 | ORAL_TABLET | ORAL | Status: DC | PRN

## 2014-02-26 NOTE — ED Notes (Signed)
Patient here with lower dental tooth pain x 3 days, thinks all the pain is related to wisdom tooth coming in. No relief with any otc meds.

## 2014-02-26 NOTE — ED Notes (Signed)
PT discharged to home with family. NAD. 

## 2014-02-26 NOTE — ED Provider Notes (Signed)
CSN: [id number]     Arrival date & time 02/26/14  1002 History   First MD Initiated Contact with Patient 02/26/14 1054     Chief Complaint  Patient presents with  . Dental Pain     (Consider location/radiation/quality/duration/timing/severity/associated sxs/prior Treatment) HPI 33 year old male presents with 3 days of left jaw/tooth pain. He states it seemed to start on. Family members state they're concerned a wisdom tooth coming in. Is not any fevers. He's had a harder time opening his mouth. He is unable to eat solid food at this time. He rates the pain as a 9/10. Denies facial swelling. Feels like he has a sore throat. Called his dentist today are unable to see him until 57.  Past Medical History  Diagnosis Date  . Cough     a. PFTS 09/2012: restriction probable, further examinations recommended. Saw pulm 11/2012: placed on GERD rx and short course prednisone, planned to regroup.  . LV dysfunction     a. Identified 10/2012 by echo EF 20-25%. b. Echo 01/2013: EF 20-25%, TEE EF 15% with severe RV dysfunction as well.  [name] History of palpitations     2x/month for several years feels heart racing  . CVA (cerebral vascular accident) 01/23/2013    tPA administered; MRI brain showed small acute R MCA infarct  . PFO (patent foramen ovale)     a. By TEE 01/2013.  [name] CHF (congestive heart failure)    Past Surgical History  Procedure Laterality Date  . No past surgeries    . Tee without cardioversion N/A 01/26/2013    Procedure: TRANSESOPHAGEAL ECHOCARDIOGRAM (TEE);  Surgeon: [name], MD;  Location: [facility] ENDOSCOPY;  Service: Cardiovascular;  Laterality: N/A;   Family History  Problem Relation Age of Onset  . Sarcoidosis Mother   . Heart Problems Mother     PPM implantation age 28   History  Substance Use Topics  . Smoking status: Never Smoker   . Smokeless tobacco: Not on file  . Alcohol Use: No    Review of Systems  Constitutional: Negative for fever.  HENT: Positive for  dental problem. Negative for drooling, trouble swallowing and voice change.   Gastrointestinal: Negative for vomiting.  All other systems reviewed and are negative.     Allergies  Review of patient's allergies indicates no known allergies.  Home Medications   Prior to Admission medications   Medication Sig Start Date End Date Taking? Authorizing Provider  carvedilol (COREG) 25 MG tablet Take 1 tablet (25 mg total) by mouth 2 (two) times daily. 11/09/13   [name], MD  HYDROcodone-acetaminophen (NORCO/VICODIN) 5-325 MG per tablet Take 1-2 tablets by mouth every 4 (four) hours as needed for moderate pain or severe pain. 02/26/14   Audree Camel, MD  losartan (COZAAR) 50 MG tablet Take 1 tablet (50 mg total) by mouth daily. 12/15/13   [name], MD  Rivaroxaban (XARELTO) 20 MG TABS tablet Take 1 tablet (20 mg total) by mouth daily. 05/10/13   [name], MD  spironolactone (ALDACTONE) 25 MG tablet Take 1 tablet (25 mg total) by mouth daily. 10/08/13   [name], MD   BP 140/97  Pulse 79  Temp(Src) 98.1 F (36.7 C)  Resp 20  Wt 250 lb (113.399 kg)  SpO2 99% Physical Exam  Nursing note and vitals reviewed. Constitutional: He is oriented to person, place, and time. He appears well-developed and well-nourished.  HENT:  Head: Normocephalic and atraumatic.  Right Ear:  External ear normal.  Left Ear: External ear normal.  Nose: Nose normal.  Mouth/Throat: No dental abscesses or uvula swelling.    Is able to open his mouth with very limited range of motion. No jaw tenderness or swelling.  Eyes: Right eye exhibits no discharge. Left eye exhibits no discharge.  Neck: Normal range of motion. Neck supple.  Cardiovascular: Normal rate, regular rhythm, normal heart sounds and intact distal pulses.   Pulmonary/Chest: Effort normal.  Abdominal: Soft. There is no tenderness.  Musculoskeletal: He exhibits no edema.  Lymphadenopathy:    He has no cervical adenopathy.    Neurological: He is alert and oriented to person, place, and time.  Skin: Skin is warm and dry.    ED Course  Procedures (including critical care time) Labs Review Labs Reviewed - No data to display  Imaging Review No results found.   EKG Interpretation None      MDM   Final diagnoses:  Pain, dental    Patient's pain appears to be coming from his most posterior molar on the left. I do not see any obvious dental caries or abscess. He is very mildly decreased range of motion of his mouth but no significant trismus. No uvula swelling. No signs of infection. This molar appears to be growing distal towards his other teeth instead of up towards his upper mouth. I believe that this is likely a was induced seem to be abnormally growing. He likely need it removed. This time I have low suspicion for any sort of infection or deep space neck infection. Discussed strict return precautions including fevers, trismus, drooling, etc. Will followup with his dentist.    Audree CamelScott T Katlynn Naser, MD 02/26/14 1140

## 2014-02-26 NOTE — Discharge Instructions (Signed)

## 2014-03-15 ENCOUNTER — Encounter: Payer: Self-pay | Admitting: Cardiovascular Disease

## 2014-03-15 ENCOUNTER — Ambulatory Visit (INDEPENDENT_AMBULATORY_CARE_PROVIDER_SITE_OTHER): Payer: 59 | Admitting: Cardiovascular Disease

## 2014-03-15 VITALS — BP 110/88 | HR 76 | Ht 74.0 in | Wt 242.0 lb

## 2014-03-15 DIAGNOSIS — I5022 Chronic systolic (congestive) heart failure: Secondary | ICD-10-CM

## 2014-03-15 DIAGNOSIS — I509 Heart failure, unspecified: Secondary | ICD-10-CM

## 2014-03-15 NOTE — Assessment & Plan Note (Addendum)
Nathan Baird has done well.  His last echo showed that his EF was 20-25% he thinks that his ejection fraction is actually increased because his cough is improved his breathing has improved. We've referred him to see [name] for consideration for an ICD placement. His symptoms are very well controlled at this time.  He's under maximal medication at this time. We previously had him on Cozaar 100 mg that he was having lots of lightheadedness and so we decreased his losartan 50 mg a day.  Continue with carvedilol 25 mg twice a day and spironolactone 25 mg a day.

## 2014-03-15 NOTE — Progress Notes (Addendum)
Nathan Baird Date of Birth  [birth year]       [facility]    [place] [address]  [address], [address] [place], [place]  [address]   [place], [place]  [address] [phone]     [phone]   Fax  [phone]    Fax [phone]  Problem List: 1. Congestive heart failure-ejection fraction of 20-25% by echo 2. Restrictive lung disease 3. CVA  History of Present Illness:  Nathan Baird is a 33 year old gentleman who is a new consultation for further evaluation of his cough. He had an echocardiogram and was found to have ejection fraction of between 20 and 25%. I started him on Lasix 40 mg a day as well as potassium chloride 20 mg a day. He developed severe orthostatic hypotension and felt very poorly after taking one dose Lasix and he stopped.  Surprisingly, he does not have much shortness of breath. His basketball on a regular basis. He is able to play for hours at a time and does not have any significant shortness of breath. He does have a cough. He's tried a Z-Pak which cleared up the greenish color of his sputum but he is still having this cough.  He denies any PND orthopnea. He denies any syncope or presyncope. He denies any chest pain. He does not have any cardiac history.  Dec 02, 2012:  Welcome is feeling a bit better. His cough has improved. He still has episodes of profound fatigue.  His breathing is better.  He has greatly reduced his salt intake.  He still plays basket ball on a regular basis.   March 10, 2013:  Nov. 3, 2014:  Nathan Baird has gained some weight since his last ov.   He complains of a slight head ache / dizziness.   He has occasional episodes of orthostasis.   Otherwise, he is feeling well - breathing well.    Jan. 30, 2015:  Nathan Baird is doing OK.   He played basketball 2 weeks ago and felt great.  He was able to play without dyspnea.   His most recent echocardiogram in November revealed improvement of his left ventricular  systolic function. The official reading on the echo reports an ejection fraction of 30%  Reviewed the echo and I think that his ejection fraction is actually a little bit better- at least 35%.  In one view, the Simpson calculation reported 42%.  October 08, 2013:  Nathan Baird seems to be doing fairly well. We increased his losartan to 100 mg a day during his last visit but he started having episodes of lightheadedness any developed a headache. The symptoms resolved when he decreased his dose back down to the 50 mg where we were previously.  Nov 09, 2013:  [name] is doing ok.   December 10, 2013:   Nathan Baird is doing wel  Sept. 8, 2015:  Nathan Baird is doing well.  No dyspnea.  His last echocardiogram revealed his ejection fraction had decreased back down to 25%. He thinks that his EF has gone back up because he is not having shortness breath and his cough is better.  We have referred him to [name] for consideration for an ICD. He does not have a left bundle branch block so I do not think that he needs a CRT.    Current Outpatient Prescriptions on File Prior to Visit  Medication Sig Dispense Refill  . carvedilol (COREG) 25 MG tablet Take 1 tablet (25  mg total) by mouth 2 (two) times daily.  60 tablet  6  . losartan (COZAAR) 50 MG tablet Take 1 tablet (50 mg total) by mouth daily.  30 tablet  2  . Rivaroxaban (XARELTO) 20 MG TABS tablet Take 1 tablet (20 mg total) by mouth daily.  30 tablet  6  . spironolactone (ALDACTONE) 25 MG tablet Take 1 tablet (25 mg total) by mouth daily.  30 tablet  3   No current facility-administered medications on file prior to visit.    No Known Allergies  Past Medical History  Diagnosis Date  . Cough     a. PFTS 09/2012: restriction probable, further examinations recommended. Saw pulm 11/2012: placed on GERD rx and short course prednisone, planned to regroup.  . LV dysfunction     a. Identified 10/2012 by echo EF 20-25%. b. Echo 01/2013: EF 20-25%, TEE EF 15% with severe RV  dysfunction as well.  Nathan Baird History of palpitations     2x/month for several years feels heart racing  . CVA (cerebral vascular accident) 01/23/2013    tPA administered; MRI brain showed small acute R MCA infarct  . PFO (patent foramen ovale)     a. By TEE 01/2013.  Nathan Baird CHF (congestive heart failure)     Past Surgical History  Procedure Laterality Date  . No past surgeries    . Tee without cardioversion N/A 01/26/2013    Procedure: TRANSESOPHAGEAL ECHOCARDIOGRAM (TEE);  Surgeon: [name], MD;  Location: [facility] ENDOSCOPY;  Service: Cardiovascular;  Laterality: N/A;    History  Smoking status  . Never Smoker   Smokeless tobacco  . Not on file    History  Alcohol Use No    Family History  Problem Relation Age of Onset  . Sarcoidosis Mother   . Heart Problems Mother     PPM implantation age 51    Reviw of Systems:  Reviewed in the HPI.  All other systems are negative.  Physical Exam: Blood pressure 110/88, pulse 76, height 6\' 2"  (1.88 m), weight 242 lb (109.77 kg).  Wt Readings from Last 3 Encounters:  03/15/14 242 lb (109.77 kg)  02/26/14 250 lb (113.399 kg)  12/10/13 250 lb (113.399 kg)    General: Well developed, well nourished, in no acute distress.  Head: Normocephalic, atraumatic, sclera non-icteric, mucus membranes are moist,   Neck: Supple. Carotids are 2 + without bruits. No JVD   Lungs: Clear   Heart: Regular rate S1-S2. No S3 gallop.  Abdomen: Soft, non-tender, non-distended with normal bowel sounds.  Msk:  Strength and tone are normal   Extremities: No clubbing or cyanosis. No edema.  Distal pedal pulses are 2+ and equal   Neuro: CN II - XII intact.  Alert and oriented X 3.   Psych:  Normal   ECG:  Assessment / Plan:

## 2014-03-15 NOTE — Patient Instructions (Signed)
Your physician recommends that you schedule a follow-up appointment in: as soon as possible with [name] for evaluation of ICD placement  Your physician recommends that you continue on your current medications as directed. Please refer to the Current Medication list given to you today.  Your physician recommends that you schedule a follow-up appointment in: 3 months with Dr. Elease Hashimoto.

## 2014-03-23 ENCOUNTER — Telehealth: Payer: Self-pay | Admitting: Nurse Practitioner

## 2014-03-23 NOTE — Telephone Encounter (Signed)
Patient left the office without making follow-up appointments with [name] for ICD placement and with Dr. Elease Hashimoto for primary cardiology.  I spoke with patient and emphasized the importance of follow-up.  Patient verbalized understanding and agreement and states he will call back with his decision of whether to schedule an appointment with [name] or to go for another opinion.

## 2014-05-04 ENCOUNTER — Ambulatory Visit: Payer: Self-pay

## 2014-05-04 ENCOUNTER — Other Ambulatory Visit: Payer: Self-pay | Admitting: Occupational Medicine

## 2014-05-04 DIAGNOSIS — R52 Pain, unspecified: Secondary | ICD-10-CM

## 2014-05-19 ENCOUNTER — Other Ambulatory Visit: Payer: Self-pay | Admitting: *Deleted

## 2014-05-19 ENCOUNTER — Observation Stay (HOSPITAL_COMMUNITY)
Admission: EM | Admit: 2014-05-19 | Discharge: 2014-05-21 | Disposition: A | Discharge status: Home / Self Care | Payer: 59 | Attending: Internal Medicine | Admitting: Internal Medicine

## 2014-05-19 ENCOUNTER — Emergency Department (HOSPITAL_COMMUNITY): Payer: 59

## 2014-05-19 ENCOUNTER — Encounter (HOSPITAL_COMMUNITY): Payer: Self-pay | Admitting: Emergency Medicine

## 2014-05-19 DIAGNOSIS — Z79899 Other long term (current) drug therapy: Secondary | ICD-10-CM | POA: Diagnosis not present

## 2014-05-19 DIAGNOSIS — I5022 Chronic systolic (congestive) heart failure: Secondary | ICD-10-CM | POA: Diagnosis not present

## 2014-05-19 DIAGNOSIS — E785 Hyperlipidemia, unspecified: Secondary | ICD-10-CM | POA: Diagnosis not present

## 2014-05-19 DIAGNOSIS — R079 Chest pain, unspecified: Secondary | ICD-10-CM | POA: Insufficient documentation

## 2014-05-19 DIAGNOSIS — G459 Transient cerebral ischemic attack, unspecified: Principal | ICD-10-CM | POA: Diagnosis present

## 2014-05-19 DIAGNOSIS — E782 Mixed hyperlipidemia: Secondary | ICD-10-CM | POA: Insufficient documentation

## 2014-05-19 DIAGNOSIS — R29898 Other symptoms and signs involving the musculoskeletal system: Secondary | ICD-10-CM

## 2014-05-19 DIAGNOSIS — Q211 Atrial septal defect: Secondary | ICD-10-CM | POA: Diagnosis not present

## 2014-05-19 DIAGNOSIS — I10 Essential (primary) hypertension: Secondary | ICD-10-CM | POA: Insufficient documentation

## 2014-05-19 DIAGNOSIS — I639 Cerebral infarction, unspecified: Secondary | ICD-10-CM

## 2014-05-19 DIAGNOSIS — R0602 Shortness of breath: Secondary | ICD-10-CM

## 2014-05-19 DIAGNOSIS — R519 Headache, unspecified: Secondary | ICD-10-CM

## 2014-05-19 DIAGNOSIS — Z8673 Personal history of transient ischemic attack (TIA), and cerebral infarction without residual deficits: Secondary | ICD-10-CM | POA: Diagnosis not present

## 2014-05-19 DIAGNOSIS — R51 Headache: Secondary | ICD-10-CM

## 2014-05-19 DIAGNOSIS — Q2112 Patent foramen ovale: Secondary | ICD-10-CM

## 2014-05-19 DIAGNOSIS — G451 Carotid artery syndrome (hemispheric): Secondary | ICD-10-CM

## 2014-05-19 LAB — BASIC METABOLIC PANEL
Anion gap: 15 (ref 5–15)
BUN: 16 mg/dL (ref 6–23)
CO2: 24 mEq/L (ref 19–32)
CREATININE: 1.27 mg/dL (ref 0.50–1.35)
Calcium: 9.8 mg/dL (ref 8.4–10.5)
Chloride: 98 mEq/L (ref 96–112)
GFR calc Af Amer: 85 mL/min — ABNORMAL LOW (ref >90)
GFR, EST NON AFRICAN AMERICAN: 73 mL/min — AB (ref >90)
Glucose, Bld: 111 mg/dL — ABNORMAL HIGH (ref 70–99)
Potassium: 4.3 mEq/L (ref 3.7–5.3)
SODIUM: 137 meq/L (ref 137–147)

## 2014-05-19 LAB — PROTIME-INR
INR: 1.1 (ref 0.00–1.49)
PROTHROMBIN TIME: 14.3 s (ref 11.6–15.2)

## 2014-05-19 LAB — CBC
HCT: 47.3 % (ref 39.0–52.0)
Hemoglobin: 16.6 g/dL (ref 13.0–17.0)
MCH: 30.1 pg (ref 26.0–34.0)
MCHC: 35.1 g/dL (ref 30.0–36.0)
MCV: 85.7 fL (ref 78.0–100.0)
PLATELETS: 295 10*3/uL (ref 150–400)
RBC: 5.52 MIL/uL (ref 4.22–5.81)
RDW: 12.7 % (ref 11.5–15.5)
WBC: 5.9 10*3/uL (ref 4.0–10.5)

## 2014-05-19 LAB — PRO B NATRIURETIC PEPTIDE: PRO B NATRI PEPTIDE: 10.8 pg/mL (ref 0–125)

## 2014-05-19 LAB — APTT: APTT: 32 s (ref 24–37)

## 2014-05-19 LAB — I-STAT TROPONIN, ED: Troponin i, poc: 0.01 ng/mL (ref 0.00–0.08)

## 2014-05-19 LAB — ETHANOL: Alcohol, Ethyl (B): <11 mg/dL (ref 0–11)

## 2014-05-19 MED ORDER — SODIUM CHLORIDE 0.9 % IV BOLUS (SEPSIS)
1000.0000 mL | Freq: Once | INTRAVENOUS | Status: AC
  Administered 2014-05-19: 1000 mL via INTRAVENOUS

## 2014-05-19 MED ORDER — RIVAROXABAN 10 MG PO TABS: 10.0000 mg | ORAL_TABLET | Freq: Every day | ORAL | Status: DC

## 2014-05-19 MED ORDER — CARVEDILOL 25 MG PO TABS: 25.0000 mg | ORAL_TABLET | Freq: Two times a day (BID) | ORAL | Status: DC

## 2014-05-19 NOTE — ED Notes (Signed)
C/o pain in chest , headache, dizziness and left arm feeling "tired" started 2 days ago-- has gotten worse today. States had a stroke last year and CHF.

## 2014-05-19 NOTE — Consult Note (Signed)
Referring Physician: Dr. Ileana Roup    Chief Complaint: HA, vertigo, CP, SOB, left arm weakness. [phone]  05/19/2014, 11:21 PM [phone]  05/19/2014, 11:21 PM  HPI:                                                                                                                                         Nathan Baird is an 33 y.o. male with a past medical history significant for chronic systolic congestive heart failure with EF 20-25%, PFO with positive transcranial Doppler Bubble Study indicativeof medium size right to left intracardiac shunt,  right middle cerebral artery branch infarct involving the pre-motor cortex s/p IV thrombolysis, comes in today with the above stated complains. Stated that he was doing well with regard to his prior stroke, but today around 3 or 4 pm he became concerned " because I started having symptoms similar to my previous stroke". Stated that he developed a HA followed by " a spinning sensation", then CP, SOB, and the left arm became weak. He drove himself to the ED " but it was hard to drive because my head was spinning". Denies associated double vision, difficulty swallowing, unsteadiness, slurred speech, confusion, language or vision impairment. Review of his chart indicate that last August he was started on xarelto for secondary stroke prevention but he said that he ran out of the medication several weeks ago. Stroke work up 02/2013: "MRA of the brain showed no significant large vessel occlusion. Carotid Dopplers were unremarkable. Transthoracic echo showed decreased ejection fraction of 2045% with no obvious clot. Transesophageal echocardiogram confirmed severe LV dysfunction and showed spontaneous echo contrast in the left ventricle as well as a patent foramen ovale. Hemoglobin A1c was 5.6% and lipid profile showed total cholesterol 167, HDL 59, LDL 97 mg percent". Nathan Baird said that his left arm feels back to baseline.  Date last known well:  05/19/14 Time last known well: 3 pm tPA Given: no, left arm weakness resolved.   Past Medical History  Diagnosis Date  . Cough     a. PFTS 09/2012: restriction probable, further examinations recommended. Saw pulm 11/2012: placed on GERD rx and short course prednisone, planned to regroup.  . LV dysfunction     a. Identified 10/2012 by echo EF 20-25%. b. Echo 01/2013: EF 20-25%, TEE EF 15% with severe RV dysfunction as well.  [name] History of palpitations     2x/month for several years feels heart racing  . CVA (cerebral vascular accident) 01/23/2013    tPA administered; MRI brain showed small acute R MCA infarct  . PFO (patent foramen ovale)     a. By TEE 01/2013.  [name] CHF (congestive heart failure)     Past Surgical History  Procedure Laterality Date  . No past surgeries    . Tee without cardioversion N/A 01/26/2013    Procedure: TRANSESOPHAGEAL ECHOCARDIOGRAM (TEE);  Surgeon: Lelon Perla, MD;  Location: [facility] ENDOSCOPY;  Service: Cardiovascular;  Laterality: N/A;    Family History  Problem Relation Age of Onset  . Sarcoidosis Mother   . Heart Problems Mother     PPM implantation age 40   Social History:  reports that he has never smoked. He does not have any smokeless tobacco history on file. He reports that he does not drink alcohol or use illicit drugs.  Allergies: No Known Allergies  Medications:                                                                                                                           I have reviewed the patient's current medications.  ROS:                                                                                                                                       History obtained from the patient and chart review.  General ROS: negative for - chills, fatigue, fever, night sweats, or weight loss Psychological ROS: negative for - behavioral disorder, hallucinations, memory difficulties, mood swings or suicidal ideation Ophthalmic ROS:  negative for - blurry vision, double vision, eye pain or loss of vision ENT ROS: negative for - epistaxis, nasal discharge, oral lesions, sore throat, tinnitus or vertigo Allergy and Immunology ROS: negative for - hives or itchy/watery eyes Hematological and Lymphatic ROS: negative for - bleeding problems, bruising or swollen lymph nodes Endocrine ROS: negative for - galactorrhea, hair pattern changes, polydipsia/polyuria or temperature intolerance Respiratory ROS: negative for - cough, hemoptysis, shortness of breath or wheezing Cardiovascular ROS: significant  for - chest pain Gastrointestinal ROS: negative for - abdominal pain, diarrhea, hematemesis, nausea/vomiting or stool incontinence Genito-Urinary ROS: negative for - dysuria, hematuria, incontinence or urinary frequency/urgency Musculoskeletal ROS: negative for - joint swelling or muscular weakness Neurological ROS: as noted in HPI Dermatological ROS: negative for rash and skin lesion changes  Physical exam: pleasant male in no apparent distress. Blood pressure 105/64, pulse 77, temperature 98.7 F (37.1 C), resp. rate 20, SpO2 96 %. Head: normocephalic. Neck: supple, no bruits, no JVD. Cardiac: no murmurs. Lungs: clear. Abdomen: soft, no tender, no mass. Extremities: no edema. Neurologic Examination:  General: Mental Status: Alert, oriented, thought content appropriate.  Speech fluent without evidence of aphasia.  Able to follow 3 step commands without difficulty. Cranial Nerves: II: Discs flat bilaterally; Visual fields grossly normal, pupils equal, round, reactive to light and accommodation III,IV, VI: ptosis not present, extra-ocular motions intact bilaterally V,VII: smile symmetric, facial light touch sensation normal bilaterally VIII: hearing normal bilaterally IX,X: gag reflex present XI: bilateral shoulder shrug XII:  midline tongue extension without atrophy or fasciculations  Motor: Right : Upper extremity   5/5    Left:     Upper extremity   5/5  Lower extremity   5/5     Lower extremity   5/5 Tone and bulk:normal tone throughout; no atrophy noted Sensory: Pinprick and light touch intact throughout, bilaterally Deep Tendon Reflexes:  Right: Upper Extremity   Left: Upper extremity   biceps (C-5 to C-6) 2/4   biceps (C-5 to C-6) 2/4 tricep (C7) 2/4    triceps (C7) 2/4 Brachioradialis (C6) 2/4  Brachioradialis (C6) 2/4  Lower Extremity Lower Extremity  quadriceps (L-2 to L-4) 2/4   quadriceps (L-2 to L-4) 2/4 Achilles (S1) 2/4   Achilles (S1) 2/4  Plantars: Right: downgoing   Left: downgoing Cerebellar: normal finger-to-nose,  normal heel-to-shin test Gait:  No tested      Results for orders placed or performed during the hospital encounter of 05/19/14 (from the past 48 hour(s))  CBC     Status: None   Collection Time: 05/19/14  7:21 PM  Result Value Ref Range   WBC 5.9 4.0 - 10.5 K/uL   RBC 5.52 4.22 - 5.81 MIL/uL   Hemoglobin 16.6 13.0 - 17.0 g/dL   HCT 47.3 39.0 - 52.0 %   MCV 85.7 78.0 - 100.0 fL   MCH 30.1 26.0 - 34.0 pg   MCHC 35.1 30.0 - 36.0 g/dL   RDW 12.7 11.5 - 15.5 %   Platelets 295 150 - 400 K/uL  Basic metabolic panel     Status: Abnormal   Collection Time: 05/19/14  7:21 PM  Result Value Ref Range   Sodium 137 137 - 147 mEq/L   Potassium 4.3 3.7 - 5.3 mEq/L    Comment: HEMOLYSIS AT THIS LEVEL MAY AFFECT RESULT   Chloride 98 96 - 112 mEq/L   CO2 24 19 - 32 mEq/L   Glucose, Bld 111 (H) 70 - 99 mg/dL   BUN 16 6 - 23 mg/dL   Creatinine, Ser 1.27 0.50 - 1.35 mg/dL   Calcium 9.8 8.4 - 10.5 mg/dL   GFR calc non Af Amer 73 (L) >90 mL/min   GFR calc Af Amer 85 (L) >90 mL/min    Comment: (NOTE) The eGFR has been calculated using the CKD EPI equation. This calculation has not been validated in all clinical situations. eGFR's persistently <90 mL/min signify possible  Chronic Kidney Disease.    Anion gap 15 5 - 15  BNP (order ONLY if patient complains of dyspnea/SOB AND you have documented it for THIS visit)     Status: None   Collection Time: 05/19/14  7:21 PM  Result Value Ref Range   Pro B Natriuretic peptide (BNP) 10.8 0 - 125 pg/mL  I-stat troponin, ED (not at [facility])     Status: None   Collection Time: 05/19/14  7:29 PM  Result Value Ref Range   Troponin i, poc 0.01 0.00 - 0.08 ng/mL   Comment 3  Comment: Due to the release kinetics of cTnI, a negative result within the first hours of the onset of symptoms does not rule out myocardial infarction with certainty. If myocardial infarction is still suspected, repeat the test at appropriate intervals.   Ethanol     Status: None   Collection Time: 05/19/14  9:53 PM  Result Value Ref Range   Alcohol, Ethyl (B) <11 0 - 11 mg/dL    Comment:        LOWEST DETECTABLE LIMIT FOR SERUM ALCOHOL IS 11 mg/dL FOR MEDICAL PURPOSES ONLY   Protime-INR     Status: None   Collection Time: 05/19/14  9:53 PM  Result Value Ref Range   Prothrombin Time 14.3 11.6 - 15.2 seconds   INR 1.10 0.00 - 1.49  APTT     Status: None   Collection Time: 05/19/14  9:53 PM  Result Value Ref Range   aPTT 32 24 - 37 seconds   Dg Chest 2 View  05/19/2014   CLINICAL DATA:  Chest pain with dizziness for 2 days. Initial encounter.  EXAM: CHEST  2 VIEW  COMPARISON:  Radiographs 01/23/2013 and 12/04/2012.  FINDINGS: There is overall improved aeration of the lungs. The lungs are now clear. There is no pleural effusion or pneumothorax. The pulmonary vascularity is within normal limits. There is stable mild cardiomegaly.  IMPRESSION: Stable radiographic appearance of the chest.  No acute findings.   Electronically Signed   By: Camie Patience M.D.   On: 05/19/2014 20:19   Ct Head Wo Contrast  05/19/2014   CLINICAL DATA:  Headache and dizziness beginning 2 days ago, hypertension. History of stroke.  EXAM: CT HEAD WITHOUT  CONTRAST  TECHNIQUE: Contiguous axial images were obtained from the base of the skull through the vertex without intravenous contrast.  COMPARISON:  MRI of the head January 24, 2013  FINDINGS: The ventricles and sulci are normal. No intraparenchymal hemorrhage, mass effect nor midline shift. No acute large vascular territory infarcts. Known RIGHT frontal lobe small infarct is difficult to appreciate on CT.  No abnormal extra-axial fluid collections. Basal cisterns are patent.  No skull fracture. Remote RIGHT and possibly LEFT nondisplaced nasal bone fractures. The included ocular globes and orbital contents are non-suspicious. The mastoid aircells and included paranasal sinuses are well-aerated. Abnormal soft tissue within the fundus of the RIGHT external auditory canal possibly extending into the middle ear, the ossicles are not well characterized.  IMPRESSION: No acute intracranial process.  Abnormal soft tissue within the RIGHT external auditory canal/middle ear, recommend direct inspection. If history of conductive hearing loss, consider temporal bone CT on a nonemergent basis.   Electronically Signed   By: Elon Alas   On: 05/19/2014 23:02    Assessment: 33 y.o. male with HA, vertigo, CP, SOB, left arm weakness. NIHSS 0 at this time, and CT brain without acute abnormality. Patient was started on xarelto for secondary stroke prevention last year for presumed cardiogenic embolism from his left ventricular dysfunction and probable clot but he ran out xarelto several weeks ago. The left arm weakness had resolved, thus suspect probable TIA. Will recommend resuming xarelto and completing TIA work up, although mechanism most likely embolism secondary to low EF.  Stroke Risk Factors - CHF, prior stroke  Plan: 1. HgbA1c, fasting lipid panel 2. MRI, MRA  of the brain without contrast 3. Echocardiogram 4. Carotid dopplers 5. Prophylactic therapy-xarelto 6. Risk factor modification 7. Telemetry  monitoring 8. Frequent neuro checks 9. PT/OT SLP (  no needed at this time)  Dorian Pod, MD [facility] [phone]  05/19/2014, 11:21 PM

## 2014-05-19 NOTE — ED Provider Notes (Signed)
CSN: [id number]     Arrival date & time 05/19/14  1858 History   First MD Initiated Contact with Patient 05/19/14 2011     Chief Complaint  Patient presents with  . Chest Pain     (Consider location/radiation/quality/duration/timing/severity/associated sxs/prior Treatment) The history is provided by the patient. No language interpreter was used.  Nathan Baird is a 33 y/o M with PMhx of CHF, CVA of the right side in 01/2013 presenting to the ED with headache and chest pain. Patient reported that he does have history of HTN and reported that he started to develop a headache on Tuesday localized to the entire head described as a throbbing sensation that went away with time - denied sudden onset or worst headache of life. Patient reported that he started to experience chest pain localized to the center of his chest described as a pressure sensation without radiation that started at 3:00-4:00PM while the patient was playing a video game. Stated that the chest pain was associated with shortness of breath - stated that the pain continues, but is decreasing with time. Reported that at the time of the chest pain he started to experience left arm weakness. Stated that he did not take any medications at home. Reported that when he was diagnosed with CVA last year he reported that he developed headache first and started to have shortness of breath with left arm weakness, numbness, and tingling - reported that shortly after that the patient started to experience weakness to the left leg. Denied numbness, tingling, neck pain, neck stiffness, head injury, fall, blurred vision, sudden loss of vision, traveling, leg swelling, history of blood clots, nausea, vomiting, diaphoresis, fever, chills, leg weakness or numbness. PCP Dr. Elease Hashimoto  Past Medical History  Diagnosis Date  . Cough     a. PFTS 09/2012: restriction probable, further examinations recommended. Saw pulm 11/2012: placed on GERD rx and short course  prednisone, planned to regroup.  . LV dysfunction     a. Identified 10/2012 by echo EF 20-25%. b. Echo 01/2013: EF 20-25%, TEE EF 15% with severe RV dysfunction as well.  [name] History of palpitations     2x/month for several years feels heart racing  . CVA (cerebral vascular accident) 01/23/2013    tPA administered; MRI brain showed small acute R MCA infarct  . PFO (patent foramen ovale)     a. By TEE 01/2013.  [name] CHF (congestive heart failure)    Past Surgical History  Procedure Laterality Date  . No past surgeries    . Tee without cardioversion N/A 01/26/2013    Procedure: TRANSESOPHAGEAL ECHOCARDIOGRAM (TEE);  Surgeon: [name], MD;  Location: [facility] ENDOSCOPY;  Service: Cardiovascular;  Laterality: N/A;   Family History  Problem Relation Age of Onset  . Sarcoidosis Mother   . Heart Problems Mother     PPM implantation age 29   History  Substance Use Topics  . Smoking status: Never Smoker   . Smokeless tobacco: Not on file  . Alcohol Use: No    Review of Systems  Constitutional: Negative for fever, chills and diaphoresis.  Eyes: Negative for visual disturbance.  Respiratory: Positive for cough and shortness of breath. Negative for chest tightness.   Cardiovascular: Positive for chest pain.  Gastrointestinal: Negative for nausea and vomiting.  Musculoskeletal: Negative for back pain, neck pain and neck stiffness.  Neurological: Positive for weakness (left arm ) and headaches. Negative for dizziness.      Allergies  Review of patient's  allergies indicates no known allergies.  Home Medications   Prior to Admission medications   Medication Sig Start Date End Date Taking? Authorizing Provider  carvedilol (COREG) 25 MG tablet Take 1 tablet (25 mg total) by mouth 2 (two) times daily. 05/19/14  Yes [name], MD  losartan (COZAAR) 50 MG tablet Take 1 tablet (50 mg total) by mouth daily. 12/15/13  Yes [name], MD  Rivaroxaban (XARELTO) 20 MG TABS tablet Take 1  tablet (20 mg total) by mouth daily. 05/10/13  Yes [name], MD  spironolactone (ALDACTONE) 25 MG tablet Take 1 tablet (25 mg total) by mouth daily. 10/08/13  Yes [name], MD   BP 105/64 mmHg  Pulse 77  Temp(Src) 98.7 F (37.1 C)  Resp 20  SpO2 96% Physical Exam  Constitutional: He is oriented to person, place, and time. He appears well-developed and well-nourished. No distress.  HENT:  Head: Normocephalic and atraumatic.  Mouth/Throat: Oropharynx is clear and moist. No oropharyngeal exudate.  Eyes: Conjunctivae and EOM are normal. Pupils are equal, round, and reactive to light. Right eye exhibits no discharge. Left eye exhibits no discharge.  Neck: Normal range of motion. Neck supple. No tracheal deviation present.  Cardiovascular: Normal rate, regular rhythm and normal heart sounds.  Exam reveals no friction rub.   No murmur heard. Pulses:      Radial pulses are 2+ on the right side, and 2+ on the left side.       Dorsalis pedis pulses are 2+ on the right side, and 2+ on the left side.  Cap refill < 3 seconds Negative swelling or pitting edema noted to the lower extremities bilaterally   Pulmonary/Chest: Effort normal and breath sounds normal. No respiratory distress. He has no wheezes. He has no rales. He exhibits no tenderness.  Patient is able to speak in full sentences without difficulty Negative use of accessory muscles Negative stridor Negative pain upon palpation to the chest wall  Musculoskeletal: Normal range of motion. He exhibits no edema or tenderness.  Full ROM to upper and lower extremities without difficulty noted, negative ataxia noted.  Lymphadenopathy:    He has no cervical adenopathy.  Neurological: He is alert and oriented to person, place, and time. No cranial nerve deficit. He exhibits normal muscle tone. Coordination normal.  Cranial nerves III-XII grossly intact Strength 5+/5+ to upper and lower extremities bilaterally with resistance applied,  equal distribution noted Equal grip strength bilaterally Strength intact to MCP, PIP, DIP joints of left hand Sensation intact with differentiation to sharp and dull touch  Negative facial droop Negative slurred speech  Negative aphasia Negative arm drift Fine motor skills intact Heel to knee down shin normal bilaterally  Skin: Skin is warm and dry. No rash noted. He is not diaphoretic. No erythema.  Psychiatric: He has a normal mood and affect. His behavior is normal. Thought content normal.  Nursing note and vitals reviewed.   ED Course  Procedures (including critical care time)  Results for orders placed or performed during the hospital encounter of 05/19/14  CBC  Result Value Ref Range   WBC 5.9 4.0 - 10.5 K/uL   RBC 5.52 4.22 - 5.81 MIL/uL   Hemoglobin 16.6 13.0 - 17.0 g/dL   HCT 16.1 09.6 - 04.5 %   MCV 85.7 78.0 - 100.0 fL   MCH 30.1 26.0 - 34.0 pg   MCHC 35.1 30.0 - 36.0 g/dL   RDW 40.9 81.1 - 91.4 %  Platelets 295 150 - 400 K/uL  Basic metabolic panel  Result Value Ref Range   Sodium 137 137 - 147 mEq/L   Potassium 4.3 3.7 - 5.3 mEq/L   Chloride 98 96 - 112 mEq/L   CO2 24 19 - 32 mEq/L   Glucose, Bld 111 (H) 70 - 99 mg/dL   BUN 16 6 - 23 mg/dL   Creatinine, Ser 8.291.27 0.50 - 1.35 mg/dL   Calcium 9.8 8.4 - 56.210.5 mg/dL   GFR calc non Af Amer 73 (L) >90 mL/min   GFR calc Af Amer 85 (L) >90 mL/min   Anion gap 15 5 - 15  BNP (order ONLY if patient complains of dyspnea/SOB AND you have documented it for THIS visit)  Result Value Ref Range   Pro B Natriuretic peptide (BNP) 10.8 0 - 125 pg/mL  Ethanol  Result Value Ref Range   Alcohol, Ethyl (B) <11 0 - 11 mg/dL  Protime-INR  Result Value Ref Range   Prothrombin Time 14.3 11.6 - 15.2 seconds   INR 1.10 0.00 - 1.49  APTT  Result Value Ref Range   aPTT 32 24 - 37 seconds  [facility] troponin, ED ([facility])  Result Value Ref Range   Troponin i, poc 0.01 0.00 - 0.08 ng/mL   Comment 3            Labs Review Labs  Reviewed  BASIC METABOLIC PANEL - Abnormal; Notable for the following:    Glucose, Bld 111 (*)    GFR calc non Af Amer 73 (*)    GFR calc Af Amer 85 (*)    All other components within normal limits  CBC  PRO B NATRIURETIC PEPTIDE  ETHANOL  PROTIME-INR  APTT  URINE RAPID DRUG SCREEN (HOSP PERFORMED)  URINALYSIS, ROUTINE W REFLEX MICROSCOPIC  HEMOGLOBIN A1C  LIPID PANEL  I-STAT TROPOININ, ED  Rosezena SensorI-STAT TROPOININ, ED    Imaging Review Dg Chest 2 View  05/19/2014   CLINICAL DATA:  Chest pain with dizziness for 2 days. Initial encounter.  EXAM: CHEST  2 VIEW  COMPARISON:  Radiographs 01/23/2013 and 12/04/2012.  FINDINGS: There is overall improved aeration of the lungs. The lungs are now clear. There is no pleural effusion or pneumothorax. The pulmonary vascularity is within normal limits. There is stable mild cardiomegaly.  IMPRESSION: Stable radiographic appearance of the chest.  No acute findings.   Electronically Signed   By: Roxy HorsemanBill  Veazey M.D.   On: 05/19/2014 20:19   Ct Head Wo Contrast  05/19/2014   CLINICAL DATA:  Headache and dizziness beginning 2 days ago, hypertension. History of stroke.  EXAM: CT HEAD WITHOUT CONTRAST  TECHNIQUE: Contiguous axial images were obtained from the base of the skull through the vertex without intravenous contrast.  COMPARISON:  MRI of the head January 24, 2013  FINDINGS: The ventricles and sulci are normal. No intraparenchymal hemorrhage, mass effect nor midline shift. No acute large vascular territory infarcts. Known RIGHT frontal lobe small infarct is difficult to appreciate on CT.  No abnormal extra-axial fluid collections. Basal cisterns are patent.  No skull fracture. Remote RIGHT and possibly LEFT nondisplaced nasal bone fractures. The included ocular globes and orbital contents are non-suspicious. The mastoid aircells and included paranasal sinuses are well-aerated. Abnormal soft tissue within the fundus of the RIGHT external auditory canal possibly  extending into the middle ear, the ossicles are not well characterized.  IMPRESSION: No acute intracranial process.  Abnormal soft tissue within the RIGHT external  auditory canal/middle ear, recommend direct inspection. If history of conductive hearing loss, consider temporal bone CT on a nonemergent basis.   Electronically Signed   By: Awilda Metro   On: 05/19/2014 23:02     EKG Interpretation None     10:32 PM This provider spoke with Dr. Cyril Mourning, Neurology - discussed case in great detail, labs, history, ED course. Dr. Cyril Mourning to come and assess patient.   11:52 PM This provider spoke with Dr. Cyril Mourning, neurology - as per physician recommended patient to be admitted to Internal Medicine on Telemetry floor.   11:58 PM This provider spoke with Dr. Allena Katz , [facility] - discussed case, labs, imaging, vitals, ED course in great detail. Discussed recommendations from Neurology. As per admitting physician, Telemetry observation.   MDM   Final diagnoses:  Headache  Left arm weakness  Chest pain, unspecified chest pain type  History of CVA (cerebrovascular accident)    Medications  rivaroxaban (XARELTO) tablet 10 mg (not administered)  sodium chloride 0.9 % bolus 1,000 mL (1,000 mLs Intravenous New Bag/Given 05/19/14 2144)   Filed Vitals:   05/19/14 1904 05/19/14 2100 05/19/14 2214 05/19/14 2230  BP: 120/79 95/60  105/64  Pulse: 96 79  77  Temp: 98.6 F (37 C)  98.7 F (37.1 C)   Resp: 20 17  20   SpO2: 99% 95%  96%   EKG noted normal sinus rhythm with a heart rate of 75 bpm. I-STAT troponin negative elevation. APTT negative findings. INR negative elevation. Ethanol negative elevation. CBC unremarkable. BMP unremarkable. BNP negative elevation. Chest x-ray negative for acute cardiopulmonary disease. CT head without contrast no acute intracranial process. Abnormal soft tissue within the right external auditory canal/middle year recommend direct expansion-with history of  conductive hearing loss considered temporal bone CT, nonemergent basis. Patient seen by neurology regarding concerns of similar symptoms that occurred last year when patient was diagnosed with CVA. Neurology agreed to admission for further work-up to be performed. Patient started on Xarelto by neurology - as per physician patient was supposed to bed started on Xarelto last year, but patient has not been taking the medications. Patient to be admitted to Telemetry for cardaic rule out as well as stroke/TIA work-up. Discussed plan for admission with patient who agrees to plan of care. Patient understood. Patient stable for transfer.   Raymon Mutton, PA-C 05/20/14 0003  Mirian Mo, MD 05/20/14 [phone]

## 2014-05-20 ENCOUNTER — Observation Stay (HOSPITAL_COMMUNITY): Payer: 59

## 2014-05-20 DIAGNOSIS — E785 Hyperlipidemia, unspecified: Secondary | ICD-10-CM | POA: Insufficient documentation

## 2014-05-20 DIAGNOSIS — I059 Rheumatic mitral valve disease, unspecified: Secondary | ICD-10-CM

## 2014-05-20 DIAGNOSIS — G459 Transient cerebral ischemic attack, unspecified: Secondary | ICD-10-CM

## 2014-05-20 DIAGNOSIS — I10 Essential (primary) hypertension: Secondary | ICD-10-CM | POA: Insufficient documentation

## 2014-05-20 DIAGNOSIS — Z79899 Other long term (current) drug therapy: Secondary | ICD-10-CM

## 2014-05-20 DIAGNOSIS — Q211 Atrial septal defect: Secondary | ICD-10-CM

## 2014-05-20 DIAGNOSIS — Z8673 Personal history of transient ischemic attack (TIA), and cerebral infarction without residual deficits: Secondary | ICD-10-CM | POA: Insufficient documentation

## 2014-05-20 DIAGNOSIS — I5022 Chronic systolic (congestive) heart failure: Secondary | ICD-10-CM

## 2014-05-20 DIAGNOSIS — I639 Cerebral infarction, unspecified: Secondary | ICD-10-CM

## 2014-05-20 LAB — RAPID URINE DRUG SCREEN, HOSP PERFORMED
Amphetamines: NOT DETECTED
Barbiturates: NOT DETECTED
Benzodiazepines: NOT DETECTED
COCAINE: NOT DETECTED
Opiates: NOT DETECTED
TETRAHYDROCANNABINOL: NOT DETECTED

## 2014-05-20 LAB — COMPREHENSIVE METABOLIC PANEL
ALT: 22 U/L (ref 0–53)
AST: 19 U/L (ref 0–37)
Albumin: 3.9 g/dL (ref 3.5–5.2)
Alkaline Phosphatase: 67 U/L (ref 39–117)
Anion gap: 10 (ref 5–15)
BUN: 14 mg/dL (ref 6–23)
CALCIUM: 9.4 mg/dL (ref 8.4–10.5)
CO2: 27 meq/L (ref 19–32)
Chloride: 103 mEq/L (ref 96–112)
Creatinine, Ser: 1.36 mg/dL — ABNORMAL HIGH (ref 0.50–1.35)
GFR calc Af Amer: 78 mL/min — ABNORMAL LOW (ref >90)
GFR, EST NON AFRICAN AMERICAN: 68 mL/min — AB (ref >90)
Glucose, Bld: 105 mg/dL — ABNORMAL HIGH (ref 70–99)
Potassium: 3.9 mEq/L (ref 3.7–5.3)
Sodium: 140 mEq/L (ref 137–147)
Total Bilirubin: 0.5 mg/dL (ref 0.3–1.2)
Total Protein: 7.1 g/dL (ref 6.0–8.3)

## 2014-05-20 LAB — CBC WITH DIFFERENTIAL/PLATELET
Basophils Absolute: 0 10*3/uL (ref 0.0–0.1)
Basophils Relative: 0 % (ref 0–1)
EOS PCT: 2 % (ref 0–5)
Eosinophils Absolute: 0.1 10*3/uL (ref 0.0–0.7)
HCT: 47 % (ref 39.0–52.0)
Hemoglobin: 16.2 g/dL (ref 13.0–17.0)
LYMPHS PCT: 31 % (ref 12–46)
Lymphs Abs: 1.8 10*3/uL (ref 0.7–4.0)
MCH: 29.8 pg (ref 26.0–34.0)
MCHC: 34.5 g/dL (ref 30.0–36.0)
MCV: 86.4 fL (ref 78.0–100.0)
Monocytes Absolute: 1.2 10*3/uL — ABNORMAL HIGH (ref 0.1–1.0)
Monocytes Relative: 20 % — ABNORMAL HIGH (ref 3–12)
NEUTROS ABS: 2.8 10*3/uL (ref 1.7–7.7)
Neutrophils Relative %: 47 % (ref 43–77)
PLATELETS: 273 10*3/uL (ref 150–400)
RBC: 5.44 MIL/uL (ref 4.22–5.81)
RDW: 12.9 % (ref 11.5–15.5)
WBC: 6 10*3/uL (ref 4.0–10.5)

## 2014-05-20 LAB — URINALYSIS, ROUTINE W REFLEX MICROSCOPIC
Bilirubin Urine: NEGATIVE
GLUCOSE, UA: NEGATIVE mg/dL
HGB URINE DIPSTICK: NEGATIVE
Ketones, ur: NEGATIVE mg/dL
Leukocytes, UA: NEGATIVE
Nitrite: NEGATIVE
PH: 6 (ref 5.0–8.0)
Protein, ur: NEGATIVE mg/dL
SPECIFIC GRAVITY, URINE: 1.016 (ref 1.005–1.030)
UROBILINOGEN UA: 1 mg/dL (ref 0.0–1.0)

## 2014-05-20 LAB — GLUCOSE, CAPILLARY
Glucose-Capillary: 92 mg/dL (ref 70–99)
Glucose-Capillary: 107 mg/dL — ABNORMAL HIGH (ref 70–99)

## 2014-05-20 LAB — PROTIME-INR
INR: 1.69 — ABNORMAL HIGH (ref 0.00–1.49)
Prothrombin Time: 20 seconds — ABNORMAL HIGH (ref 11.6–15.2)

## 2014-05-20 LAB — LIPID PANEL
CHOLESTEROL: 162 mg/dL (ref 0–200)
HDL: 45 mg/dL (ref >39)
LDL Cholesterol: 89 mg/dL (ref 0–99)
Total CHOL/HDL Ratio: 3.6 RATIO
Triglycerides: 141 mg/dL (ref <150)
VLDL: 28 mg/dL (ref 0–40)

## 2014-05-20 LAB — I-STAT TROPONIN, ED: Troponin i, poc: 0.02 ng/mL (ref 0.00–0.08)

## 2014-05-20 LAB — HEMOGLOBIN A1C
HEMOGLOBIN A1C: 5.9 % — AB (ref <5.7)
MEAN PLASMA GLUCOSE: 123 mg/dL — AB (ref <117)

## 2014-05-20 MED ORDER — LOSARTAN POTASSIUM 50 MG PO TABS
50.0000 mg | ORAL_TABLET | Freq: Every day | ORAL | Status: DC
  Administered 2014-05-20 – 2014-05-21 (×2): 50 mg via ORAL
  Filled 2014-05-20 (×2): qty 1

## 2014-05-20 MED ORDER — STROKE: EARLY STAGES OF RECOVERY BOOK
Freq: Once | Status: AC
  Administered 2014-05-20: 10:00:00
  Filled 2014-05-20: qty 1

## 2014-05-20 MED ORDER — RIVAROXABAN 20 MG PO TABS
20.0000 mg | ORAL_TABLET | Freq: Every day | ORAL | Status: DC
  Administered 2014-05-20 (×2): 20 mg via ORAL
  Filled 2014-05-20 (×3): qty 1

## 2014-05-20 MED ORDER — SPIRONOLACTONE 25 MG PO TABS
25.0000 mg | ORAL_TABLET | Freq: Every day | ORAL | Status: DC
  Administered 2014-05-20 – 2014-05-21 (×2): 25 mg via ORAL
  Filled 2014-05-20 (×2): qty 1

## 2014-05-20 MED ORDER — ATORVASTATIN CALCIUM 10 MG PO TABS
10.0000 mg | ORAL_TABLET | Freq: Every day | ORAL | Status: DC
  Administered 2014-05-20: 10 mg via ORAL
  Filled 2014-05-20: qty 1

## 2014-05-20 MED ORDER — CARVEDILOL 12.5 MG PO TABS
25.0000 mg | ORAL_TABLET | Freq: Two times a day (BID) | ORAL | Status: DC
  Administered 2014-05-20 – 2014-05-21 (×2): 25 mg via ORAL
  Filled 2014-05-20: qty 2
  Filled 2014-05-20: qty 1
  Filled 2014-05-20: qty 2
  Filled 2014-05-20: qty 1

## 2014-05-20 NOTE — ED Notes (Signed)
Pt up to BR again able to ambulate by self

## 2014-05-20 NOTE — Progress Notes (Signed)
Patient seen and examined earlier today by my colleague Dr. Allena Katz. Patient seen and examined by me, database reviewed. Patient presented with left sided arm weakness, back to his baseline. Has history of right MCA territory stroke. Has severe dilated cardiomyopathy with ejection fraction of 15-20%. Off of his medications, his home medications restarted.  [name] Pager: [phone] 05/20/2014, 6:46 PM

## 2014-05-20 NOTE — Progress Notes (Signed)
Patient admitted via ED. Patient alert and oriented x 4. Patient oriented to the room. Will continue to monitor.

## 2014-05-20 NOTE — Progress Notes (Signed)
VASCULAR LAB PRELIMINARY  PRELIMINARY  PRELIMINARY  PRELIMINARY  Bilateral lower extremity venous duplex and Carotid duplex  completed.    Preliminary report:   1.  Venous:  Bilateral:  No evidence of DVT, superficial thrombosis, or Baker's Cyst. 2.  Carotid:  Bilateral:  1-39% ICA stenosis.  Vertebral artery flow is antegrade.      Yariah Selvey, RVT 05/20/2014, 11:30 AM

## 2014-05-20 NOTE — Progress Notes (Signed)
STROKE TEAM PROGRESS NOTE   HISTORY Nathan Baird is an 33 y.o. male with a past medical history significant for chronic systolic congestive heart failure with EF 20-25%, PFO with positive transcranial Doppler Bubble Study indicativeof medium size right to left intracardiac shunt, right middle cerebral artery branch infarct involving the pre-motor cortex s/p IV thrombolysis, comes in today with the above stated complains. Stated that he was doing well with regard to his prior stroke, but today around 3 or 4 pm he became concerned " because I started having symptoms similar to my previous stroke". Stated that he developed a HA followed by " a spinning sensation", then CP, SOB, and the left arm became weak. He drove himself to the ED " but it was hard to drive because my head was spinning". Denies associated double vision, difficulty swallowing, unsteadiness, slurred speech, confusion, language or vision impairment. Review of his chart indicate that last August he was started on xarelto for secondary stroke prevention but he said that he ran out of the medication several weeks ago. Stroke work up 02/2013: "MRA of the brain showed no significant large vessel occlusion. Carotid Dopplers were unremarkable. Transthoracic echo showed decreased ejection fraction of 2045% with no obvious clot. Transesophageal echocardiogram confirmed severe LV dysfunction and showed spontaneous echo contrast in the left ventricle as well as a patent foramen ovale. Hemoglobin A1c was 5.6% and lipid profile showed total cholesterol 167, HDL 59, LDL 97 mg percent". Nathan Baird said that his left arm feels back to baseline.  Date last known well: 05/19/14 Time last known well: 3 pm tPA Given: no, left arm weakness resolved.  SUBJECTIVE (INTERVAL HISTORY) No family is at the bedside.  Overall he feels his condition is rapidly improving. He stated that he still has mild headache but weakness feeling is gone. MRI did not show  acute stroke. He stated that Tuesday he had HA and he checked his BP at 133/103 and pulse 101. And second day HA still there and he check his BP 128/95 and pulse 100. And then he felt fatigue, dizziness, chest pain and SOB with mild left are weakness.   He said that he has no migraine HA history and he usually does not have HA in the past. The HA only stated last week. He has off medication for 2 weeks since he has not refill them yet.    OBJECTIVE Temp:  [98 F (36.7 C)-99.7 F (37.6 C)] 99.7 F (37.6 C) (11/13 1514) Pulse Rate:  [66-89] 89 (11/13 1514) Cardiac Rhythm:  [-] Normal sinus rhythm (11/13 1330) Resp:  [13-20] 20 (11/13 1514) BP: (97-122)/(56-86) 109/74 mmHg (11/13 1514) SpO2:  [90 %-100 %] 98 % (11/13 1514) Weight:  [245 lb (111.131 kg)] 245 lb (111.131 kg) (11/13 1324)   Recent Labs Lab 05/20/14 1644  GLUCAP 92    Recent Labs Lab 05/19/14 1921 05/20/14 0750  NA 137 140  K 4.3 3.9  CL 98 103  CO2 24 27  GLUCOSE 111* 105*  BUN 16 14  CREATININE 1.27 1.36*  CALCIUM 9.8 9.4    Recent Labs Lab 05/20/14 0750  AST 19  ALT 22  ALKPHOS 67  BILITOT 0.5  PROT 7.1  ALBUMIN 3.9    Recent Labs Lab 05/19/14 1921 05/20/14 0750  WBC 5.9 6.0  NEUTROABS  --  2.8  HGB 16.6 16.2  HCT 47.3 47.0  MCV 85.7 86.4  PLT 295 273   No results for input(s): CKTOTAL, CKMB, CKMBINDEX, TROPONINI  in the last 168 hours.  Recent Labs  05/19/14 2153 05/20/14 0750  LABPROT 14.3 20.0*  INR 1.10 1.69*    Recent Labs  05/19/14 2352  COLORURINE YELLOW  LABSPEC 1.016  PHURINE 6.0  GLUCOSEU NEGATIVE  HGBUR NEGATIVE  BILIRUBINUR NEGATIVE  KETONESUR NEGATIVE  PROTEINUR NEGATIVE  UROBILINOGEN 1.0  NITRITE NEGATIVE  LEUKOCYTESUR NEGATIVE       Component Value Date/Time   CHOL 162 05/20/2014 0026   TRIG 141 05/20/2014 0026   HDL 45 05/20/2014 0026   CHOLHDL 3.6 05/20/2014 0026   VLDL 28 05/20/2014 0026   LDLCALC 89 05/20/2014 0026   Lab Results  Component  Value Date   HGBA1C 5.9* 05/19/2014      Component Value Date/Time   LABOPIA NONE DETECTED 05/19/2014 2351   COCAINSCRNUR NONE DETECTED 05/19/2014 2351   LABBENZ NONE DETECTED 05/19/2014 2351   AMPHETMU NONE DETECTED 05/19/2014 2351   THCU NONE DETECTED 05/19/2014 2351   LABBARB NONE DETECTED 05/19/2014 2351     Recent Labs Lab 05/19/14 2153  ETH <11    Dg Chest 2 View  05/19/2014    IMPRESSION: Stable radiographic appearance of the chest.  No acute findings.      Ct Head Wo Contrast  05/19/2014    IMPRESSION: No acute intracranial process.  Abnormal soft tissue within the RIGHT external auditory canal/middle ear, recommend direct inspection. If history of conductive hearing loss, consider temporal bone CT on a nonemergent basis.      Mri and Mra Head Wo Contrast  05/20/2014   IMPRESSION: 1. No acute intracranial abnormality, and unremarkable noncontrast MRI appearance the brain today. Virtually no residual encephalomalacia or gliosis detected from the small 2014 right MCA infarct. 2. Stable and negative intracranial MRA.      2D echo - - Left ventricle: The cavity size was severely dilated. Wall thickness was increased in a pattern of mild LVH. Systolic function was severely reduced. The estimated ejection fraction was in the range of 15% to 20%. Doppler parameters are consistent with abnormal left ventricular relaxation (grade 1 diastolic dysfunction). - Mitral valve: There was mild regurgitation.  LE venous doppler - No evidence of DVT, superficial thrombosis, or Baker's Cyst.  CUS - Bilateral: 1-39% ICA stenosis. Vertebral artery flow is antegrade.   PHYSICAL EXAM  Temp:  [98 F (36.7 C)-99.7 F (37.6 C)] 99.7 F (37.6 C) (11/13 1514) Pulse Rate:  [66-89] 89 (11/13 1514) Resp:  [13-20] 20 (11/13 1514) BP: (97-122)/(56-86) 109/74 mmHg (11/13 1514) SpO2:  [90 %-100 %] 98 % (11/13 1514) Weight:  [245 lb (111.131 kg)] 245 lb (111.131 kg) (11/13  1324)  General - Well nourished, well developed, in no apparent distress.  Ophthalmologic - Sharp disc margins OU.  Cardiovascular - Regular rate and rhythm with no murmur.  Mental Status -  Level of arousal and orientation to time, place, and person were intact. Language including expression, naming, repetition, comprehension, reading, and writing was assessed and found intact. Attention span and concentration were normal. Fund of Knowledge was assessed and was intact.  Cranial Nerves II - XII - II - Visual field intact OU. III, IV, VI - Extraocular movements intact. V - Facial sensation intact bilaterally. VII - Facial movement intact bilaterally. VIII - Hearing & vestibular intact bilaterally. X - Palate elevates symmetrically. XI - Chin turning & shoulder shrug intact bilaterally. XII - Tongue protrusion intact.  Motor Strength - The patient's strength was normal in all extremities and pronator drift was  absent.  Bulk was normal and fasciculations were absent.   Motor Tone - Muscle tone was assessed at the neck and appendages and was normal.  Reflexes - The patient's reflexes were normal in all extremities and he had no pathological reflexes.  Sensory - Light touch, temperature/pinprick, vibration and proprioception, and Romberg testing were assessed and were normal.    Coordination - The patient had normal movements in the hands and feet with no ataxia or dysmetria.  Tremor was absent.  Gait and Station - The patient's transfers, posture, gait, station, and turns were observed as normal.   ASSESSMENT/PLAN Nathan Baird is a 33 y.o. male with history of CHF, cardiomyopathy with low EF, large PFO, right MCA stroke s/p tPA in 01/2013 presenting with HA, high BP, dizziness, left arm weakness, CP and SOB. He did not receive IV t-PA due to late arrival. He was on Xarelto but off medication for several weeks as he did not refill the meds yet. MRI did not show acute infarct. His  presentation consistent with hypertensive emergency  vs. Complicated migraine. Left arm weakness most likely recrudescence from his previous stroke. Not stroke. He needs to continue Xarelto. However, his EF now is 15-20% even worse than 5 months ago, therefore he needs cardiology consult for consideration of ICD.   Hypertensive emergency vs. Complicated migarine:  Left arm weakness likely due to recrudescence from prior stroke. Currently symptoms resolved.   Resultant  No focal deficit  MRI  No acute stroke   MRA  unremarkable  Carotid Doppler  unremarkable  2D Echo  EF 15-20%  LDL 89, not at the goal  HgbA1c 5.9, at the goal  Xarelto for VTE prophylaxis  Diet Heart thin liquids  no anticoagulation as he did not refill meds prior to admission, now on xarelto ( rivaroxaban). Continue on Xarelto.   Patient counseled to be compliant with his antithrombotic medications  Ongoing aggressive risk factor management  Cardiomyopathy - EF low at 15-20% down from 20-25% in 12/2013 - needs cardiology consult for low EF - needs EP consult to consider ICD placement.  Large PFO - LE DVT negative - continue Xarelto for anticoagulation   Hx of stroke - 01/2013 right frontal near premotor cortex - s/p tPA - left arm weakness recovered well. No baseline weakness   Hypertension  Home meds:   Coreg, losartan and spironolactone, resumed in hospital  Stable  BP goal normotensive now.  Patient counseled to be compliant with his blood pressure medications  Hyperlipidemia  Home meds:  None  LDL 89, not at goal < 70  Add lipitor low dose.   Continue statin at discharge  Hospital day # 1  Neurology will sign off. Please call with questions. Pt will follow up with Nathan Baird at [facility] in about 2 months. Pt is Nathan Baird pt in the past. Thanks for the consult.   Marvel Plan, MD PhD Stroke Neurology 05/20/2014 10:51 PM      To contact Stroke Continuity provider, please refer to  [email or web address]. After hours, contact General Neurology

## 2014-05-20 NOTE — H&P (Signed)
[facility] History and Physical  Patient: Nathan Baird  ZDG:[id number]  DOB: 1981/05/29  DOS: the patient was seen and examined on 05/20/2014 PCP: Pcp Not In System  Chief Complaint: left arm weakness  HPI: Nathan Baird is a 33 y.o. male with Past medical history of CVA, chronic systolic heart failure, patent foramen ovale. The patient is presenting with complaints of left arm weakness. He mentions that he was doing well but around 3:57 PM he started developing headache with a spinning sensation then shortness of breath and left arm weakness. He denies any fever, chills, blurring of the vision, speech difficulty, chest pain, abdominal pain, nausea, vomiting, diarrhea, burning urination, or any other focal deficit. His left arm weakness resolved while he was here in the ER. He was on Xarelto which he stopped taking several weeks ago as he has never refilled it.  The patient is coming from home. And at his baseline independent for most of his ADL.  Review of Systems: as mentioned in the history of present illness.  A Comprehensive review of the other systems is negative.  Past Medical History  Diagnosis Date  . Cough     a. PFTS 09/2012: restriction probable, further examinations recommended. Saw pulm 11/2012: placed on GERD rx and short course prednisone, planned to regroup.  . LV dysfunction     a. Identified 10/2012 by echo EF 20-25%. b. Echo 01/2013: EF 20-25%, TEE EF 15% with severe RV dysfunction as well.  [name] History of palpitations     2x/month for several years feels heart racing  . CVA (cerebral vascular accident) 01/23/2013    tPA administered; MRI brain showed small acute R MCA infarct  . PFO (patent foramen ovale)     a. By TEE 01/2013.  [name] CHF (congestive heart failure)    Past Surgical History  Procedure Laterality Date  . No past surgeries    . Tee without cardioversion N/A 01/26/2013    Procedure: TRANSESOPHAGEAL ECHOCARDIOGRAM (TEE);  Surgeon: [name],  MD;  Location[facility] ENDOSCOPY;  Service: Cardiovascular;  Laterality: N/A;   Social History:  reports that he has never smoked. He does not have any smokeless tobacco history on file. He reports that he does not drink alcohol or use illicit drugs.  No Known Allergies  Family History  Problem Relation Age of Onset  . Sarcoidosis Mother   . Heart Problems Mother     PPM implantation age 42    Prior to Admission medications   Medication Sig Start Date End Date Taking? Authorizing Provider  carvedilol (COREG) 25 MG tablet Take 1 tablet (25 mg total) by mouth 2 (two) times daily. 05/19/14  Yes [name], MD  losartan (COZAAR) 50 MG tablet Take 1 tablet (50 mg total) by mouth daily. 12/15/13  Yes [name], MD  Rivaroxaban (XARELTO) 20 MG TABS tablet Take 1 tablet (20 mg total) by mouth daily. 05/10/13  Yes [name], MD  spironolactone (ALDACTONE) 25 MG tablet Take 1 tablet (25 mg total) by mouth daily. 10/08/13  Yes [name], MD    Physical Exam: Filed Vitals:   05/20/14 0045 05/20/14 0145 05/20/14 0215 05/20/14 0245  BP: 105/68 109/80 106/76 103/73  Pulse: 73 72 70 74  Temp:      Resp: 16 15 14 14   SpO2: 97% 98% 96% 95%    General: Alert, Awake and Oriented to Time, Place and Person. Appear in mild distress Eyes: PERRL ENT: Oral  Mucosa clear moist. Neck: no JVD Cardiovascular: S1 and S2 Present, no Murmur, Peripheral Pulses Present Respiratory: Bilateral Air entry equal and Decreased, Clear to Auscultation, noCrackles, no wheezes Abdomen: Bowel Sound present, Soft and non tender Skin: no Rash Extremities: no Pedal edema, no calf tenderness Neurologic: Grossly no focal neuro deficit.  Labs on Admission:  CBC:  Recent Labs Lab 05/19/14 1921  WBC 5.9  HGB 16.6  HCT 47.3  MCV 85.7  PLT 295    CMP     Component Value Date/Time   NA 137 05/19/2014 1921   K 4.3 05/19/2014 1921   CL 98 05/19/2014 1921   CO2 24 05/19/2014 1921   GLUCOSE 111*  05/19/2014 1921   BUN 16 05/19/2014 1921   CREATININE 1.27 05/19/2014 1921   CALCIUM 9.8 05/19/2014 1921   PROT 7.0 01/23/2013 2234   ALBUMIN 3.9 01/23/2013 2234   AST 31 01/23/2013 2234   ALT 25 01/23/2013 2234   ALKPHOS 65 01/23/2013 2234   BILITOT 0.4 01/23/2013 2234   GFRNONAA 73* 05/19/2014 1921   GFRAA 85* 05/19/2014 1921    No results for input(s): LIPASE, AMYLASE in the last 168 hours. No results for input(s): AMMONIA in the last 168 hours.  No results for input(s): CKTOTAL, CKMB, CKMBINDEX, TROPONINI in the last 168 hours. BNP (last 3 results)  Recent Labs  08/06/13 0934 05/19/14 1921  PROBNP 9.0 10.8    Radiological Exams on Admission: Dg Chest 2 View  05/19/2014   CLINICAL DATA:  Chest pain with dizziness for 2 days. Initial encounter.  EXAM: CHEST  2 VIEW  COMPARISON:  Radiographs 01/23/2013 and 12/04/2012.  FINDINGS: There is overall improved aeration of the lungs. The lungs are now clear. There is no pleural effusion or pneumothorax. The pulmonary vascularity is within normal limits. There is stable mild cardiomegaly.  IMPRESSION: Stable radiographic appearance of the chest.  No acute findings.   Electronically Signed   By: Roxy Horseman M.D.   On: 05/19/2014 20:19   Ct Head Wo Contrast  05/19/2014   CLINICAL DATA:  Headache and dizziness beginning 2 days ago, hypertension. History of stroke.  EXAM: CT HEAD WITHOUT CONTRAST  TECHNIQUE: Contiguous axial images were obtained from the base of the skull through the vertex without intravenous contrast.  COMPARISON:  MRI of the head January 24, 2013  FINDINGS: The ventricles and sulci are normal. No intraparenchymal hemorrhage, mass effect nor midline shift. No acute large vascular territory infarcts. Known RIGHT frontal lobe small infarct is difficult to appreciate on CT.  No abnormal extra-axial fluid collections. Basal cisterns are patent.  No skull fracture. Remote RIGHT and possibly LEFT nondisplaced nasal bone fractures.  The included ocular globes and orbital contents are non-suspicious. The mastoid aircells and included paranasal sinuses are well-aerated. Abnormal soft tissue within the fundus of the RIGHT external auditory canal possibly extending into the middle ear, the ossicles are not well characterized.  IMPRESSION: No acute intracranial process.  Abnormal soft tissue within the RIGHT external auditory canal/middle ear, recommend direct inspection. If history of conductive hearing loss, consider temporal bone CT on a nonemergent basis.   Electronically Signed   By: Awilda Metro   On: 05/19/2014 23:02  EKG: Independently reviewed. normal sinus rhythm, T wave inversions in inferolateral leads which are unchanged from prior EKG.  Assessment/Plan Principal Problem:   TIA (transient ischemic attack) Active Problems:   Chronic systolic congestive heart failure   Encounter for long-term (current) use of high-risk medication  Patent foramen ovale with right to left shunt   1. TIA (transient ischemic attack) The patient is presenting with complaints of left arm weakness. He has been evaluated by the neurology as the patient has a TIA. Patient is noncompliant with his Xarelto. At present the patient is back to his baseline. Currently I would admit him to the hospital and obtain further stroke workup. Monitor him on telemetry and follow serial neuro checks. Patient will remain nothing by mouth until stroke swelling evaluation is done. Patient will be restarted on Xarelto per neurology.  2.chronic systolic congestive heart failure. At present his only symptoms appears she only. We will continue to closely monitor and obtain echocardiogram.  Advance goals of care discussion: full code   Consults: neurology  DVT Prophylaxis: on chronic anticoagulation Nutrition: cardiac diet  Disposition: Admitted to observation in telemetry unit.  Author: Lynden OxfordPranav Patel, MD [facility] Pager:  [phone] 05/20/2014,     If 7PM-7AM, please contact night-coverage [email or web address] Password [email or web address]

## 2014-05-20 NOTE — Progress Notes (Signed)
ANTICOAGULATION CONSULT NOTE - Initial Consult  Pharmacy Consult for Xarelto  Indication: stroke, secondary prevention   No Known Allergies   Vital Signs: Temp: 98.7 F (37.1 C) (11/12 2214) BP: 109/80 mmHg (11/13 0145) Pulse Rate: 72 (11/13 0145)  Labs:  Recent Labs  05/19/14 1921 05/19/14 2153  HGB 16.6  --   HCT 47.3  --   PLT 295  --   APTT  --  32  LABPROT  --  14.3  INR  --  1.10  CREATININE 1.27  --     Assessment: 33 y/o M previously on Xarelto for secondary stroke prevention but had ran out of medication. To resume Xarelto per neurology rec's. CBC good, renal function good, other labs as above.   Goal of Therapy:  Monitor platelets by anticoagulation protocol: Yes   Plan:  -Resume Xarelto at previous dose of 20 mg daily with supper (already ordered) -Minimum q72h CBC while inpatient -Monitor for bleeding  Abran Duke 05/20/2014,1:56 AM

## 2014-05-20 NOTE — Progress Notes (Signed)
  Echocardiogram 2D Echocardiogram has been performed.  Nathan Baird [name] 05/20/2014, 11:49 AM

## 2014-05-20 NOTE — ED Notes (Signed)
Back from MRI ,pt ate  Breakfast , placed back on monitor lab in to dram am labs

## 2014-05-20 NOTE — Progress Notes (Signed)
Occupational Therapy Evaluation/ Discharge Patient Details Name: Nathan Baird MRN: [id number] DOB: April 30, 1981 Today's Date: 05/20/2014    History of Present Illness 33 y.o. male admitted to [facility] on 05/19/14 for Left arm weakness, HA, spinning sensation, and SOB.  Pt with significant PMHx of LV dysfunction, CVA, patent foramen ovale, and CHF.    Clinical Impression   PTA pt lived with his family and was independent with ADLs and IADLs. 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 Pt feels nearly back to baseline and is independent with ADLs at this time. Vision assessed and WFL. No further acute OT needs.     Follow Up Recommendations  No OT follow up    Equipment Recommendations  None recommended by OT    Recommendations for Other Services       Precautions / Restrictions Precautions Precautions: None      Mobility Bed Mobility Overal bed mobility: Independent                Transfers Overall transfer level: Independent                    Balance Overall balance assessment: Independent                                    ADL Overall ADL's : Independent;At baseline                                       General ADL Comments: Pt overall at baseline for ADLs. He reports that LUE no longer feels numb or weak. He does c/o "feeling off" but ambulating around room and performing ADLs with no difficulty. Educated pt on signs and symptoms of stroke using teachback method.      Vision  Pt reports no change from baseline. No visual deficits.                Additional Comments: Vision screen performed and all WFL.   Perception Perception Perception Tested?: No   Praxis Praxis Praxis tested?: Within functional limits    Pertinent Vitals/Pain Pain Assessment: No/denies pain     Hand Dominance Right   Extremity/Trunk Assessment Upper Extremity Assessment Upper Extremity  Assessment: Overall WFL for tasks assessed   Lower Extremity Assessment Lower Extremity Assessment: Overall WFL for tasks assessed   Cervical / Trunk Assessment Cervical / Trunk Assessment: Normal   Communication Communication Communication: No difficulties   Cognition Arousal/Alertness: Awake/alert Behavior During Therapy: WFL for tasks assessed/performed Overall Cognitive Status: Within Functional Limits for tasks assessed                                Home Living Family/patient expects to be discharged to:: Private residence Living Arrangements: Spouse/significant other Available Help at Discharge: Family;Available PRN/intermittently (fiancee works) Type of Home: Apartment Home Access: Stairs to enter [employer] of Steps: 1 (curb step) Entrance Stairs-Rails: None Home Layout: One level     Bathroom Shower/Tub: Chief Strategy Officer: Standard     Home Equipment: None          Prior Functioning/Environment Level of Independence: Independent        Comments: 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    OT Diagnosis: Other (comment) (altered sensation)                          End of Session Nurse Communication: Mobility status  Activity Tolerance: Patient tolerated treatment well Patient left: Other (comment);with call bell/phone within reach (sitting EOB with dinner)   Time: [id number] OT Time Calculation (min): 14 min Charges:  OT General Charges $OT Visit: 1 Procedure OT Evaluation $Initial OT Evaluation Tier I: 1 Procedure  Nathan Baird, Nathan Baird 05/20/2014, 5:52 PM   Carney LivingLeeAnn Marie Christianna Baird, OTR/L Occupational Therapist [phone] (pager)

## 2014-05-20 NOTE — Evaluation (Signed)
Physical Therapy Evaluation/Discharge Patient Details Name: Nathan Baird MRN: [id number] DOB: Nov 10, 1980 Today's Date: 05/20/2014   History of Present Illness  33 y.o. male admitted to [facility] on 05/19/14 for Left arm weakness, HA, spinning sensation, and SOB.  Pt with significant PMHx of LV dysfunction, CVA, patent foramen ovale, and CHF.   Clinical Impression  Pt is independent with all mobility.  Balance and vestibular systems seem normal.  Pt does report a vague sense of fogginess/head pressure with gait and does not quite feel his normal self despite being independent.  PT to sign off as he has no deficits for [place] to work on.      Follow Up Recommendations No PT follow up    Equipment Recommendations  None recommended by PT    Recommendations for Other Services   NA     Precautions / Restrictions Precautions Precautions: None      Mobility  Bed Mobility Overal bed mobility: Independent                Transfers Overall transfer level: Independent                  Ambulation/Gait Ambulation/Gait assistance: Independent Ambulation Distance (Feet): 300 Feet Assistive device: None Gait Pattern/deviations: WFL(Within Functional Limits)   Gait velocity interpretation: at or above normal speed for age/gender    Stairs Stairs:  (for got to do the stairs for DGI)                 Balance Overall balance assessment: Independent                               Standardized Balance Assessment Standardized Balance Assessment : Dynamic Gait Index   Dynamic Gait Index Level Surface: Normal Change in Gait Speed: Normal Gait with Horizontal Head Turns: Normal Gait with Vertical Head Turns: Normal Gait and Pivot Turn: Normal Step Over Obstacle: Normal Step Around Obstacles: Normal Steps:  (left off, likely based on other preformance WNL)       Pertinent Vitals/Pain Pain Assessment: No/denies pain    Home Living Family/patient expects  to be discharged to:: Private residence Living Arrangements: Spouse/significant other Available Help at Discharge: Family;Available PRN/intermittently (fiancee works) Type of Home: Apartment Home Access: Stairs to enter Entrance Stairs-Rails: None Secretary/administrator of Steps: 1 (curb step) Home Layout: One level Home Equipment: None      Prior Function Level of Independence: Independent         Comments: 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     Higher education careers adviser   Dominant Hand: Right    Extremity/Trunk Assessment   Upper Extremity Assessment: Defer to OT evaluation           Lower Extremity Assessment: Overall WFL for tasks assessed      Cervical / Trunk Assessment: Normal  Communication   Communication: No difficulties  Cognition Arousal/Alertness: Awake/alert Behavior During Therapy: WFL for tasks assessed/performed Overall Cognitive Status: Within Functional Limits for tasks assessed                      General Comments General comments (skin integrity, edema, etc.): Vestibular testing negative for any vestibular issues.  Roll test (-) bil, dix hallpike (-) bil, smooth pursuits and VOR testing WNL.  Pt only reports a real mild sense of fogginess and pressure in his head with gait.  Assessment/Plan    PT Assessment Patent does not need any further PT services  PT Diagnosis Generalized weakness;Other (comment) (vertigo)         PT Goals (Current goals can be found in the Care Plan section) Acute Rehab PT Goals PT Goal Formulation: All assessment and education complete, DC therapy               End of Session   Activity Tolerance: Patient tolerated treatment well Patient left: Other (comment) (in bathroom) Nurse Communication: Mobility status (to RN tech)    Functional Assessment Tool Used: assist level Functional Limitation: Mobility: Walking and moving around Mobility: Walking and Moving Around  Current Status [phone](G8978): 0 percent impaired, limited or restricted Mobility: Walking and Moving Around Goal Status [phone](G8979): 0 percent impaired, limited or restricted Mobility: Walking and Moving Around Discharge Status [phone](G8980): 0 percent impaired, limited or restricted    Time: [id number] PT Time Calculation (min) (ACUTE ONLY): 17 min   Charges:   PT Evaluation $Initial PT Evaluation Tier I: 1 Procedure     PT G Codes:   Functional Assessment Tool Used: assist level Functional Limitation: Mobility: Walking and moving around    ByronRebecca B. Giles Currie, PT, DPT [phone]   05/20/2014, 4:46 PM

## 2014-05-20 NOTE — Progress Notes (Signed)
UR completed 

## 2014-05-21 DIAGNOSIS — I10 Essential (primary) hypertension: Secondary | ICD-10-CM

## 2014-05-21 DIAGNOSIS — E785 Hyperlipidemia, unspecified: Secondary | ICD-10-CM

## 2014-05-21 DIAGNOSIS — Z8673 Personal history of transient ischemic attack (TIA), and cerebral infarction without residual deficits: Secondary | ICD-10-CM

## 2014-05-21 LAB — BASIC METABOLIC PANEL
ANION GAP: 15 (ref 5–15)
BUN: 15 mg/dL (ref 6–23)
CHLORIDE: 103 meq/L (ref 96–112)
CO2: 24 mEq/L (ref 19–32)
CREATININE: 1.18 mg/dL (ref 0.50–1.35)
Calcium: 9.7 mg/dL (ref 8.4–10.5)
GFR calc non Af Amer: 80 mL/min — ABNORMAL LOW (ref >90)
Glucose, Bld: 98 mg/dL (ref 70–99)
POTASSIUM: 4.4 meq/L (ref 3.7–5.3)
Sodium: 142 mEq/L (ref 137–147)

## 2014-05-21 MED ORDER — CARVEDILOL 25 MG PO TABS: 25.0000 mg | ORAL_TABLET | Freq: Two times a day (BID) | ORAL | Status: DC

## 2014-05-21 MED ORDER — LOSARTAN POTASSIUM 50 MG PO TABS: 50.0000 mg | ORAL_TABLET | Freq: Every day | ORAL | Status: DC

## 2014-05-21 MED ORDER — ATORVASTATIN CALCIUM 10 MG PO TABS: 10.0000 mg | ORAL_TABLET | Freq: Every day | ORAL | Status: DC

## 2014-05-21 MED ORDER — RIVAROXABAN 20 MG PO TABS: 20.0000 mg | ORAL_TABLET | Freq: Every day | ORAL | Status: DC

## 2014-05-21 MED ORDER — SPIRONOLACTONE 25 MG PO TABS: 25.0000 mg | ORAL_TABLET | Freq: Every day | ORAL | Status: DC

## 2014-05-21 NOTE — Discharge Summary (Signed)
Physician Discharge Summary  Nathan Simonsony J Shieh WUJ:[id number] DOB: 03/22/1981 DOA: 05/19/2014  PCP: Pcp Not In System  Admit date: 05/19/2014 Discharge date: 05/21/2014  Time spent: 40 minutes  Recommendations for Outpatient Follow-up:  1. Follow-up with Dr. Elease HashimotoNahser within 1 week. 2. Check BMP within 1 week.  Discharge Diagnoses:  Principal Problem:   TIA (transient ischemic attack) Active Problems:   Chronic systolic congestive heart failure   Encounter for long-term (current) use of high-risk medication   Patent foramen ovale with right to left shunt   History of CVA (cerebrovascular accident)   Essential hypertension   HLD (hyperlipidemia)   Discharge Condition: stable  Diet recommendation: heart healthy  Filed Weights   05/20/14 1324  Weight: 111.131 kg (245 lb)    History of present illness:  Nathan Baird is a 33 y.o. male with Past medical history of CVA, chronic systolic heart failure, patent foramen ovale. The patient is presenting with complaints of left arm weakness. He mentions that he was doing well but around 3:57 PM he started developing headache with a spinning sensation then shortness of breath and left arm weakness. He denies any fever, chills, blurring of the vision, speech difficulty, chest pain, abdominal pain, nausea, vomiting, diarrhea, burning urination, or any other focal deficit. His left arm weakness resolved while he was here in the ER. He was on Xarelto which he stopped taking several weeks ago as he has never refilled it.  The patient is coming from home. And at his baseline independent for most of his ADL.  Hospital Course:   TIA Patient presented to the hospital with complaints of left arm weakness. Neurology consulted for TIA type of symptoms. Patient does have problems with compliance with his heart medications including Xarleto. MRI of the brain showed no evidence of CVA and resolution of previous RMCA territory CVA. Telemetry showed no  evidence of arrhythmias. Patient restarted on Xarleto.  Cardiomyopathy Patient has severe biventricular systolic dysfunction. Last 2-D echo done during this admission and showed LVEF of 15-20%. Patient does not have symptoms or signs of decompensated CHF. His medications continued including Coreg, losartan and Aldactone. No Lasix and there is no evidence of fluid overload. I reviewed the records, I can find heart done before, probably this is presumed to be nonischemic.  Increased risk of sudden cardiac death With dilated cardiomyopathy, LVEF of 15-20% he has higher risk to develop sudden cardiac death. Per his primary cardiologist he was referred previously to EP for evaluation for ICD. I asked patient about that, he said he did not go, because he was afraid he was going to lose his job if he has an ICD. Discussed over the phone with Dr. Rennis GoldenHilty, recommended to follow-up with Dr. Elease HashimotoNahser as outpatient after optimizing medical therapy. Patient right now was not taking his medication for the past 2 weeks. Prescription given for all of his medications.  Hyperlipidemia Total cholesterol of 162, LDL is 89. Patient started on low dose of statin, to bring LDL less than 70.  Procedures:  none  Consultations:  neurology  Discharge Exam: Filed Vitals:   05/21/14 0959  BP: 101/54  Pulse: 66  Temp: 98.3 F (36.8 C)  Resp: 18   General: Alert and awake, oriented x3, not in any acute distress. HEENT: anicteric sclera, pupils reactive to light and accommodation, EOMI CVS: S1-S2 clear, no murmur rubs or gallops Chest: clear to auscultation bilaterally, no wheezing, rales or rhonchi Abdomen: soft nontender, nondistended, normal bowel sounds, no organomegaly  Extremities: no cyanosis, clubbing or edema noted bilaterally Neuro: Cranial nerves II-XII intact, no focal neurological deficits  Discharge Instructions You were cared for by a hospitalist during your hospital stay. If you have any  questions about your discharge medications or the care you received while you were in the hospital after you are discharged, you can call the unit and asked to speak with the hospitalist on call if the hospitalist that took care of you is not available. Once you are discharged, your primary care physician will handle any further medical issues. Please note that NO REFILLS for any discharge medications will be authorized once you are discharged, as it is imperative that you return to your primary care physician (or establish a relationship with a primary care physician if you do not have one) for your aftercare needs so that they can reassess your need for medications and monitor your lab values.  Discharge Instructions    Ambulatory referral to Neurology    Complete by:  As directed   Pt will follow up with Dr. Pearlean Brownie at [facility] in about 2 months. Pt is Dr. Marlis Edelson patient in the past. Thanks.     Diet - low sodium heart healthy    Complete by:  As directed      Increase activity slowly    Complete by:  As directed           Current Discharge Medication List    START taking these medications   Details  atorvastatin (LIPITOR) 10 MG tablet Take 1 tablet (10 mg total) by mouth daily at 6 PM. Qty: 30 tablet, Refills: 0   Associated Diagnoses: History of CVA (cerebrovascular accident)      CONTINUE these medications which have CHANGED   Details  carvedilol (COREG) 25 MG tablet Take 1 tablet (25 mg total) by mouth 2 (two) times daily. Qty: 60 tablet, Refills: 0    losartan (COZAAR) 50 MG tablet Take 1 tablet (50 mg total) by mouth daily. Qty: 30 tablet, Refills: 0    rivaroxaban (XARELTO) 20 MG TABS tablet Take 1 tablet (20 mg total) by mouth daily. Qty: 30 tablet, Refills: 0   Associated Diagnoses: Chronic systolic congestive heart failure; SOB (shortness of breath)    spironolactone (ALDACTONE) 25 MG tablet Take 1 tablet (25 mg total) by mouth daily. Qty: 30 tablet, Refills: 0   Associated  Diagnoses: Chronic systolic congestive heart failure       No Known Allergies Follow-up Information    Follow up with Elyn Aquas., MD In 1 week.   Specialty:  Cardiology   Contact information:   [address] [place] [address] [phone]        The results of significant diagnostics from this hospitalization (including imaging, microbiology, ancillary and laboratory) are listed below for reference.    Significant Diagnostic Studies: Dg Chest 2 View  05/19/2014   CLINICAL DATA:  Chest pain with dizziness for 2 days. Initial encounter.  EXAM: CHEST  2 VIEW  COMPARISON:  Radiographs 01/23/2013 and 12/04/2012.  FINDINGS: There is overall improved aeration of the lungs. The lungs are now clear. There is no pleural effusion or pneumothorax. The pulmonary vascularity is within normal limits. There is stable mild cardiomegaly.  IMPRESSION: Stable radiographic appearance of the chest.  No acute findings.   Electronically Signed   By: Roxy Horseman M.D.   On: 05/19/2014 20:19   Ct Head Wo Contrast  05/19/2014   CLINICAL DATA:  Headache and dizziness beginning 2 days ago, hypertension. History of stroke.  EXAM: CT HEAD WITHOUT CONTRAST  TECHNIQUE: Contiguous axial images were obtained from the base of the skull through the vertex without intravenous contrast.  COMPARISON:  MRI of the head January 24, 2013  FINDINGS: The ventricles and sulci are normal. No intraparenchymal hemorrhage, mass effect nor midline shift. No acute large vascular territory infarcts. Known RIGHT frontal lobe small infarct is difficult to appreciate on CT.  No abnormal extra-axial fluid collections. Basal cisterns are patent.  No skull fracture. Remote RIGHT and possibly LEFT nondisplaced nasal bone fractures. The included ocular globes and orbital contents are non-suspicious. The mastoid aircells and included paranasal sinuses are well-aerated. Abnormal soft tissue within the fundus of the RIGHT external  auditory canal possibly extending into the middle ear, the ossicles are not well characterized.  IMPRESSION: No acute intracranial process.  Abnormal soft tissue within the RIGHT external auditory canal/middle ear, recommend direct inspection. If history of conductive hearing loss, consider temporal bone CT on a nonemergent basis.   Electronically Signed   By: Awilda Metro   On: 05/19/2014 23:02   Mr Maxine Glenn Head Wo Contrast  05/20/2014   CLINICAL DATA:  33 year old male with acute left upper extremity weakness. Headache and dizziness. Initial encounter. History of prior right MCA infarct.  EXAM: MRI HEAD WITHOUT CONTRAST  MRA HEAD WITHOUT CONTRAST  TECHNIQUE: Multiplanar, multiecho pulse sequences of the brain and surrounding structures were obtained without intravenous contrast. Angiographic images of the head were obtained using MRA technique without contrast.  COMPARISON:  Head CT without contrast 05/19/2014. Brain MRI and MRA 01/24/2013  FINDINGS: MRI HEAD FINDINGS  Major intracranial vascular flow voids are stable. No restricted diffusion or evidence of acute infarction.  Signal abnormality along the right motor strip associated with the 2014 small infarcts has resolved with virtually no residual gliosis or encephalomalacia identified. Elsewhere gray and white matter signal remains normal. No midline shift, mass effect, evidence of mass lesion, ventriculomegaly, extra-axial collection or acute intracranial hemorrhage. Cervicomedullary junction and pituitary are within normal limits. Negative visualized cervical spine. Normal bone marrow signal.  Visible internal auditory structures appear normal. Stable minimal paranasal sinus mucosal thickening. Chronic left lamina papyracea fracture re- identified. Otherwise negative orbits soft tissues. Visualized scalp soft tissues are within normal limits.  MRA HEAD FINDINGS  Stable antegrade flow in the posterior circulation with dominant distal right vertebral  artery. Normal right PICA origin. The left vertebral artery terminates in PICA. The basilar artery is stable and within normal limits. SCA and PCA origins remain normal. Posterior communicating arteries are stable and within normal limits. Bilateral PCA branches are within normal limits.  Stable antegrade flow in both ICA siphons. No siphon stenosis. Ophthalmic and posterior communicating artery origins are within normal limits. Normal carotid termini, MCA and ACA origins. Anterior communicating artery and visualized bilateral ACA branches are within normal limits. Left MCA branches are stable and within normal limits. Right MCA branches are stable and within normal limits.  IMPRESSION: 1. No acute intracranial abnormality, and unremarkable noncontrast MRI appearance the brain today. Virtually no residual encephalomalacia or gliosis detected from the small 2014 right MCA infarct. 2. Stable and negative intracranial MRA.   Electronically Signed   By: Augusto Gamble M.D.   On: 05/20/2014 08:18   [name] Contrast  05/20/2014   CLINICAL DATA:  33 year old male with acute left upper extremity weakness. Headache and dizziness. Initial encounter. History of prior right MCA  infarct.  EXAM: MRI HEAD WITHOUT CONTRAST  MRA HEAD WITHOUT CONTRAST  TECHNIQUE: Multiplanar, multiecho pulse sequences of the brain and surrounding structures were obtained without intravenous contrast. Angiographic images of the head were obtained using MRA technique without contrast.  COMPARISON:  Head CT without contrast 05/19/2014. Brain MRI and MRA 01/24/2013  FINDINGS: MRI HEAD FINDINGS  Major intracranial vascular flow voids are stable. No restricted diffusion or evidence of acute infarction.  Signal abnormality along the right motor strip associated with the 2014 small infarcts has resolved with virtually no residual gliosis or encephalomalacia identified. Elsewhere gray and white matter signal remains normal. No midline shift, mass effect,  evidence of mass lesion, ventriculomegaly, extra-axial collection or acute intracranial hemorrhage. Cervicomedullary junction and pituitary are within normal limits. Negative visualized cervical spine. Normal bone marrow signal.  Visible internal auditory structures appear normal. Stable minimal paranasal sinus mucosal thickening. Chronic left lamina papyracea fracture re- identified. Otherwise negative orbits soft tissues. Visualized scalp soft tissues are within normal limits.  MRA HEAD FINDINGS  Stable antegrade flow in the posterior circulation with dominant distal right vertebral artery. Normal right PICA origin. The left vertebral artery terminates in PICA. The basilar artery is stable and within normal limits. SCA and PCA origins remain normal. Posterior communicating arteries are stable and within normal limits. Bilateral PCA branches are within normal limits.  Stable antegrade flow in both ICA siphons. No siphon stenosis. Ophthalmic and posterior communicating artery origins are within normal limits. Normal carotid termini, MCA and ACA origins. Anterior communicating artery and visualized bilateral ACA branches are within normal limits. Left MCA branches are stable and within normal limits. Right MCA branches are stable and within normal limits.  IMPRESSION: 1. No acute intracranial abnormality, and unremarkable noncontrast MRI appearance the brain today. Virtually no residual encephalomalacia or gliosis detected from the small 2014 right MCA infarct. 2. Stable and negative intracranial MRA.   Electronically Signed   By: Augusto Gamble M.D.   On: 05/20/2014 08:18   Dg Hand Complete Right  05/04/2014   CLINICAL DATA:  Patient hit right hand with hammer. Pain and swelling mainly in region of middle and ring fingers. Initial encounter.  EXAM: RIGHT HAND - COMPLETE 3+ VIEW  COMPARISON:  None.  FINDINGS: There is no evidence of fracture or dislocation. There is no evidence of arthropathy or other focal bone  abnormality. Soft tissues are unremarkable.  IMPRESSION: Negative.   Electronically Signed   By: Myles Rosenthal M.D.   On: 05/04/2014 15:24    Microbiology: No results found for this or any previous visit (from the past 240 hour(s)).   Labs: Basic Metabolic Panel:  Recent Labs Lab 05/19/14 1921 05/20/14 0750 05/21/14 0630  NA 137 140 142  K 4.3 3.9 4.4  CL 98 103 103  CO2 24 27 24   GLUCOSE 111* 105* 98  BUN 16 14 15   CREATININE 1.27 1.36* 1.18  CALCIUM 9.8 9.4 9.7   Liver Function Tests:  Recent Labs Lab 05/20/14 0750  AST 19  ALT 22  ALKPHOS 67  BILITOT 0.5  PROT 7.1  ALBUMIN 3.9   No results for input(s): LIPASE, AMYLASE in the last 168 hours. No results for input(s): AMMONIA in the last 168 hours. CBC:  Recent Labs Lab 05/19/14 1921 05/20/14 0750  WBC 5.9 6.0  NEUTROABS  --  2.8  HGB 16.6 16.2  HCT 47.3 47.0  MCV 85.7 86.4  PLT 295 273   Cardiac Enzymes: No results for input(s):  CKTOTAL, CKMB, CKMBINDEX, TROPONINI in the last 168 hours. BNP: BNP (last 3 results)  Recent Labs  08/06/13 0934 05/19/14 1921  PROBNP 9.0 10.8   CBG:  Recent Labs Lab 05/20/14 1644 05/20/14 2209  GLUCAP 92 107*       Signed:  Anakaren Campion A  [facility] 05/21/2014, 10:38 AM

## 2014-05-21 NOTE — Progress Notes (Signed)
Patient given DC instructions and prescriptions. Questions answered regarding low sodium heart healthy diet- patient denied offer to contact dietician. Patient verbalizes understanding and compliance. Patient escorted to lobby by Clinical research associate- patient to drive self home.

## 2014-05-24 ENCOUNTER — Other Ambulatory Visit: Payer: Self-pay

## 2014-05-24 MED ORDER — CARVEDILOL 25 MG PO TABS: 25.0000 mg | ORAL_TABLET | Freq: Two times a day (BID) | ORAL | Status: DC

## 2014-07-15 ENCOUNTER — Ambulatory Visit (INDEPENDENT_AMBULATORY_CARE_PROVIDER_SITE_OTHER): Payer: 59 | Admitting: Cardiovascular Disease

## 2014-07-15 ENCOUNTER — Encounter: Payer: Self-pay | Admitting: Cardiovascular Disease

## 2014-07-15 VITALS — BP 98/80 | HR 83 | Ht 74.0 in | Wt 254.1 lb

## 2014-07-15 DIAGNOSIS — I5022 Chronic systolic (congestive) heart failure: Secondary | ICD-10-CM

## 2014-07-15 DIAGNOSIS — Z8673 Personal history of transient ischemic attack (TIA), and cerebral infarction without residual deficits: Secondary | ICD-10-CM

## 2014-07-15 DIAGNOSIS — R0602 Shortness of breath: Secondary | ICD-10-CM

## 2014-07-15 MED ORDER — SPIRONOLACTONE 25 MG PO TABS: 25.0000 mg | ORAL_TABLET | Freq: Every day | ORAL | Status: DC

## 2014-07-15 MED ORDER — ATORVASTATIN CALCIUM 10 MG PO TABS: 10.0000 mg | ORAL_TABLET | Freq: Every day | ORAL | Status: DC

## 2014-07-15 MED ORDER — CARVEDILOL 25 MG PO TABS: 25.0000 mg | ORAL_TABLET | Freq: Two times a day (BID) | ORAL | Status: DC

## 2014-07-15 MED ORDER — RIVAROXABAN 20 MG PO TABS: 20.0000 mg | ORAL_TABLET | Freq: Every day | ORAL | Status: DC

## 2014-07-15 MED ORDER — LOSARTAN POTASSIUM 50 MG PO TABS: 50.0000 mg | ORAL_TABLET | Freq: Every day | ORAL | Status: DC

## 2014-07-15 NOTE — Patient Instructions (Signed)
Your physician has recommended you make the following change in your medication:  RESTART Losartan at 1/2 tab for 2 days then 1 tab daily  Your physician recommends that you return for lab work in: 3 weeks - basic metabolic panel  Your physician recommends that you schedule a follow-up appointment in: 6 weeks with Dr. Elease Hashimoto

## 2014-07-15 NOTE — Progress Notes (Signed)
Nathan Baird Date of Birth  [birth year]       [facility]    [place] [address], [address]  [address] [place], [place]  [address]   [place], [place]  [address] [phone]     [phone]   Fax  [phone]    Fax [phone]  Problem List: 1. Congestive heart failure-ejection fraction of 20-25% by echo 2. Restrictive lung disease 3. CVA  History of Present Illness:  Nathan Baird is a 34 year old gentleman who is a new consultation for further evaluation of his cough. He had an echocardiogram and was found to have ejection fraction of between 20 and 25%. I started him on Lasix 40 mg a day as well as potassium chloride 20 mg a day. He developed severe orthostatic hypotension and felt very poorly after taking one dose Lasix and he stopped.  Surprisingly, he does not have much shortness of breath. His basketball on a regular basis. He is able to play for hours at a time and does not have any significant shortness of breath. He does have a cough. He's tried a Z-Pak which cleared up the greenish color of his sputum but he is still having this cough.  He denies any PND orthopnea. He denies any syncope or presyncope. He denies any chest pain. He does not have any cardiac history.  Dec 02, 2012:  Nathan Baird is feeling a bit better. His cough has improved. He still has episodes of profound fatigue.  His breathing is better.  He has greatly reduced his salt intake.  He still plays basket ball on a regular basis.   March 10, 2013:  Nov. 3, 2014:  Nathan Baird has gained some weight since his last ov.   He complains of a slight head ache / dizziness.   He has occasional episodes of orthostasis.   Otherwise, he is feeling well - breathing well.    Jan. 30, 2015:  Nathan Baird is doing OK.   He played basketball 2 weeks ago and felt great.  He was able to play without dyspnea.   His most recent echocardiogram in November revealed improvement of his left ventricular  systolic function. The official reading on the echo reports an ejection fraction of 30%  Reviewed the echo and I think that his ejection fraction is actually a little bit better- at least 35%.  In one view, the Simpson calculation reported 42%.  October 08, 2013:  Nathan Baird seems to be doing fairly well. We increased his losartan to 100 mg a day during his last visit but he started having episodes of lightheadedness any developed a headache. The symptoms resolved when he decreased his dose back down to the 50 mg where we were previously.  Nov 09, 2013:  Nathan Baird is doing ok.   December 10, 2013:   Nathan Baird is doing wel  Sept. 8, 2015:  Nathan Baird is doing well.  No dyspnea.  His last echocardiogram revealed his ejection fraction had decreased back down to 25%. He thinks that his EF has gone back up because he is not having shortness breath and his cough is better.  We have referred him to [name] for consideration for an ICD. He does not have a left bundle branch block so I do not think that he needs a CRT.    Jan. 8, 2016:  Nathan Baird had another TIA recently.  Was in the hospital.  CT showed no new CVA He had stopped his  Xarelto.[name]   He has run out of his Xarelto again.  Also ran out of the losartan or another med. He is feeling much better.  Current Outpatient Prescriptions on File Prior to Visit  Medication Sig Dispense Refill  . carvedilol (COREG) 25 MG tablet Take 1 tablet (25 mg total) by mouth 2 (two) times daily. 60 tablet 6  . losartan (COZAAR) 50 MG tablet Take 1 tablet (50 mg total) by mouth daily. 30 tablet 0  . rivaroxaban (XARELTO) 20 MG TABS tablet Take 1 tablet (20 mg total) by mouth daily. 30 tablet 0  . spironolactone (ALDACTONE) 25 MG tablet Take 1 tablet (25 mg total) by mouth daily. 30 tablet 0  . atorvastatin (LIPITOR) 10 MG tablet Take 1 tablet (10 mg total) by mouth daily at 6 PM. (Patient not taking: Reported on 07/15/2014) 30 tablet 0   No current facility-administered medications on file  prior to visit.    No Known Allergies  Past Medical History  Diagnosis Date  . Cough     a. PFTS 09/2012: restriction probable, further examinations recommended. Saw pulm 11/2012: placed on GERD rx and short course prednisone, planned to regroup.  . LV dysfunction     a. Identified 10/2012 by echo EF 20-25%. b. Echo 01/2013: EF 20-25%, TEE EF 15% with severe RV dysfunction as well.  [name] History of palpitations     2x/month for several years feels heart racing  . CVA (cerebral vascular accident) 01/23/2013    tPA administered; MRI brain showed small acute R MCA infarct  . PFO (patent foramen ovale)     a. By TEE 01/2013.  [name] CHF (congestive heart failure)     Past Surgical History  Procedure Laterality Date  . No past surgeries    . Tee without cardioversion N/A 01/26/2013    Procedure: TRANSESOPHAGEAL ECHOCARDIOGRAM (TEE);  Surgeon: [name], MD;  Location: [facility] ENDOSCOPY;  Service: Cardiovascular;  Laterality: N/A;    History  Smoking status  . Never Smoker   Smokeless tobacco  . Not on file    History  Alcohol Use No    Family History  Problem Relation Age of Onset  . Sarcoidosis Mother   . Heart Problems Mother     PPM implantation age 62    Reviw of Systems:  Reviewed in the HPI.  All other systems are negative.  Physical Exam: Blood pressure 98/80, pulse 83, height 6\' 2"  (1.88 m), weight 254 lb 1.9 oz (115.268 kg), SpO2 98 %.  Wt Readings from Last 3 Encounters:  07/15/14 254 lb 1.9 oz (115.268 kg)  05/20/14 245 lb (111.131 kg)  03/15/14 242 lb (109.77 kg)    General: Well developed, well nourished, in no acute distress.  Head: Normocephalic, atraumatic, sclera non-icteric, mucus membranes are moist,   Neck: Supple. Carotids are 2 + without bruits. No JVD   Lungs: Clear   Heart: Regular rate S1-S2. No S3 gallop.  Abdomen: Soft, non-tender, non-distended with normal bowel sounds.  Msk:  Strength and tone are normal   Extremities: No clubbing or  cyanosis. No edema.  Distal pedal pulses are 2+ and equal   Neuro: CN II - XII intact.  Alert and oriented X 3.   Psych:  Normal   ECG:  Assessment / Plan:

## 2014-07-15 NOTE — Assessment & Plan Note (Signed)
Nathan Baird has not been compliant with his meds. He ran out of his xarelto and had a CVA.  He had run out of his Losartan also  I had a long discussion about the importance of taking all of his meds We have also discussed the importance of getting a ICD if [name] does not improve.  We will give him samples if neede.d  retart losartain at 1/2 tab for 2 days, then increase to full 50 mg tablet each day. BMP in 3 weeks Office visit in 6 weeks.  Will continue to titrate meds up as tolerated.  Repeat echo 3 months after hes been on max medical therapy

## 2014-07-28 ENCOUNTER — Telehealth: Payer: Self-pay | Admitting: *Deleted

## 2014-07-28 NOTE — Telephone Encounter (Signed)
LVM for patient to call back if he can come in today for an appointment instead of tomorrow due to expected weather.

## 2014-07-28 NOTE — Telephone Encounter (Signed)
Called patient back to inform patient that the office will be closed tomorrow and he may call back at his earliest convenience to r/s appointment.

## 2014-07-29 ENCOUNTER — Ambulatory Visit: Payer: 59 | Admitting: Neurology

## 2014-08-04 ENCOUNTER — Telehealth: Payer: Self-pay | Admitting: *Deleted

## 2014-08-04 ENCOUNTER — Other Ambulatory Visit (INDEPENDENT_AMBULATORY_CARE_PROVIDER_SITE_OTHER): Payer: 59 | Admitting: *Deleted

## 2014-08-04 DIAGNOSIS — I5022 Chronic systolic (congestive) heart failure: Secondary | ICD-10-CM

## 2014-08-04 DIAGNOSIS — R0602 Shortness of breath: Secondary | ICD-10-CM

## 2014-08-04 DIAGNOSIS — Z8673 Personal history of transient ischemic attack (TIA), and cerebral infarction without residual deficits: Secondary | ICD-10-CM

## 2014-08-04 NOTE — Telephone Encounter (Signed)
Per patient request, provided note that indicated that patient was seen in our office today (lab) for medical care, so that he could provide it to his employer. The note did not specify the provider's name or the care given, as to protect the patient's medical information.

## 2014-08-05 LAB — BASIC METABOLIC PANEL
BUN: 14 mg/dL (ref 6–23)
CO2: 23 mEq/L (ref 19–32)
Calcium: 9.4 mg/dL (ref 8.4–10.5)
Chloride: 108 mEq/L (ref 96–112)
Creatinine, Ser: 1.12 mg/dL (ref 0.40–1.50)
GFR: 97.18 mL/min (ref >60.00)
GLUCOSE: 117 mg/dL — AB (ref 70–99)
POTASSIUM: 3.8 meq/L (ref 3.5–5.1)
Sodium: 140 mEq/L (ref 135–145)

## 2014-08-16 ENCOUNTER — Encounter (HOSPITAL_BASED_OUTPATIENT_CLINIC_OR_DEPARTMENT_OTHER): Payer: Self-pay | Admitting: *Deleted

## 2014-08-16 ENCOUNTER — Emergency Department (HOSPITAL_BASED_OUTPATIENT_CLINIC_OR_DEPARTMENT_OTHER)
Admission: EM | Admit: 2014-08-16 | Discharge: 2014-08-16 | Disposition: A | Discharge status: Home / Self Care | Payer: 59 | Attending: Emergency Medicine | Admitting: Emergency Medicine

## 2014-08-16 ENCOUNTER — Emergency Department (HOSPITAL_BASED_OUTPATIENT_CLINIC_OR_DEPARTMENT_OTHER): Payer: 59

## 2014-08-16 DIAGNOSIS — M67911 Unspecified disorder of synovium and tendon, right shoulder: Secondary | ICD-10-CM

## 2014-08-16 DIAGNOSIS — Z79899 Other long term (current) drug therapy: Secondary | ICD-10-CM | POA: Diagnosis not present

## 2014-08-16 DIAGNOSIS — M25511 Pain in right shoulder: Secondary | ICD-10-CM | POA: Diagnosis present

## 2014-08-16 DIAGNOSIS — Q211 Atrial septal defect: Secondary | ICD-10-CM | POA: Diagnosis not present

## 2014-08-16 DIAGNOSIS — I509 Heart failure, unspecified: Secondary | ICD-10-CM | POA: Diagnosis not present

## 2014-08-16 DIAGNOSIS — M75101 Unspecified rotator cuff tear or rupture of right shoulder, not specified as traumatic: Secondary | ICD-10-CM | POA: Diagnosis not present

## 2014-08-16 DIAGNOSIS — Z7901 Long term (current) use of anticoagulants: Secondary | ICD-10-CM | POA: Insufficient documentation

## 2014-08-16 DIAGNOSIS — Z8673 Personal history of transient ischemic attack (TIA), and cerebral infarction without residual deficits: Secondary | ICD-10-CM | POA: Insufficient documentation

## 2014-08-16 MED ORDER — HYDROCODONE-ACETAMINOPHEN 5-325 MG PO TABS: 2.0000 | ORAL_TABLET | ORAL | Status: DC | PRN

## 2014-08-16 NOTE — ED Provider Notes (Signed)
CSN: [id number]     Arrival date & time 08/16/14  1332 History   First MD Initiated Contact with Patient 08/16/14 1343     Chief Complaint  Patient presents with  . Shoulder Pain     HPI  Patient presents with right shoulder pain for 1 week.  Hurts worse with abduction of the shoulder.  No known trauma.  Patient currently on blood thinners.  Has history of low cardiac output CHF.  Past Medical History  Diagnosis Date  . Cough     a. PFTS 09/2012: restriction probable, further examinations recommended. Saw pulm 11/2012: placed on GERD rx and short course prednisone, planned to regroup.  . LV dysfunction     a. Identified 10/2012 by echo EF 20-25%. b. Echo 01/2013: EF 20-25%, TEE EF 15% with severe RV dysfunction as well.  [name] History of palpitations     2x/month for several years feels heart racing  . CVA (cerebral vascular accident) 01/23/2013    tPA administered; MRI brain showed small acute R MCA infarct  . PFO (patent foramen ovale)     a. By TEE 01/2013.  [name] CHF (congestive heart failure)    Past Surgical History  Procedure Laterality Date  . No past surgeries    . Tee without cardioversion N/A 01/26/2013    Procedure: TRANSESOPHAGEAL ECHOCARDIOGRAM (TEE);  Surgeon: [name], MD;  Location: [facility] ENDOSCOPY;  Service: Cardiovascular;  Laterality: N/A;   Family History  Problem Relation Age of Onset  . Sarcoidosis Mother   . Heart Problems Mother     PPM implantation age 17   History  Substance Use Topics  . Smoking status: Never Smoker   . Smokeless tobacco: Not on file  . Alcohol Use: No    Review of Systems  All other systems reviewed and are negative  Allergies  Review of patient's allergies indicates no known allergies.  Home Medications   Prior to Admission medications   Medication Sig Start Date End Date Taking? Authorizing Provider  atorvastatin (LIPITOR) 10 MG tablet Take 1 tablet (10 mg total) by mouth daily at 6 PM. 07/15/14   [name], MD   carvedilol (COREG) 25 MG tablet Take 1 tablet (25 mg total) by mouth 2 (two) times daily. 07/15/14   [name], MD  HYDROcodone-acetaminophen (NORCO/VICODIN) 5-325 MG per tablet Take 2 tablets by mouth every 4 (four) hours as needed for moderate pain or severe pain. 08/16/14   Nelia Shi, MD  losartan (COZAAR) 50 MG tablet Take 1 tablet (50 mg total) by mouth daily. 07/15/14   [name], MD  rivaroxaban (XARELTO) 20 MG TABS tablet Take 1 tablet (20 mg total) by mouth daily. 07/15/14   [name], MD  spironolactone (ALDACTONE) 25 MG tablet Take 1 tablet (25 mg total) by mouth daily. 07/15/14   [name], MD   BP 111/75 mmHg  Pulse 82  Temp(Src) 98.2 F (36.8 C) (Oral)  Resp 16  Ht 6' 2.5" (1.892 m)  Wt 250 lb (113.399 kg)  BMI 31.68 kg/m2  SpO2 99% Physical Exam Physical Exam  Nursing note and vitals reviewed. Constitutional: He is oriented to person, place, and time. He appears well-developed and well-nourished. No distress.  HENT:  Head: Normocephalic and atraumatic.  Eyes: Pupils are equal, round, and reactive to light.  Neck: Normal range of motion.  Cardiovascular: Normal rate and intact distal pulses.   Pulmonary/Chest: No respiratory distress.  Abdominal: Normal appearance. He  exhibits no distension.  Musculoskeletal: Right shoulder with decreased range of motion and pain with abduction.  Tenderness to palpation along the bursa .  Neurological: He is alert and oriented to person, place, and time. No cranial nerve deficit.  Skin: Skin is warm and dry. No rash noted.  Psychiatric: He has a normal mood and affect. His behavior is normal.   ED Course  Procedures (including critical care time) Labs Review Labs Reviewed - No data to display  Imaging Review Dg Shoulder Right  08/16/2014   CLINICAL DATA:  34 year old male with constant right shoulder pain, no known injury. Initial encounter.  EXAM: RIGHT SHOULDER - 2+ VIEW  COMPARISON:  None.  FINDINGS: Bone  mineralization is within normal limits. No glenohumeral joint dislocation. Proximal left humerus intact. Visible right clavicle and scapula intact. Visible right ribs and lung parenchyma within normal limits. No acute osseous abnormality identified.  IMPRESSION: Negative radiographic appearance of the right shoulder.   Electronically Signed   By: Odessa Fleming M.D.   On: 08/16/2014 14:44   Will refer to sports medicine for follow-up.   MDM   Final diagnoses:  Disorder of right rotator cuff        Nelia Shi, MD 08/16/14 [phone]

## 2014-08-16 NOTE — Discharge Instructions (Signed)
Bursitis °Bursitis is when the fluid-filled sac (bursa) that covers and protects a joint gets puffy and irritated. The elbow, shoulder, hip, and knee joints are most often affected. °HOME CARE °· Put ice on the area. °¨ Put ice in a plastic bag. °¨ Place a towel between your skin and the bag. °¨ Leave the ice on for 15-20 minutes, 03-04 times a day. °· Put the joint through a full range of motion 4 times a day. Rest the injured joint at other times. When you have less pain, begin slow movements and usual activities. °· Only take medicine as told by your doctor. °· Follow up with your doctor. Any delay in care could stop the bursitis from healing. This could cause long-term pain. °GET HELP RIGHT AWAY IF:  °· You have more pain with treatment. °· You have a temperature by mouth above 102° F (38.9° C), not controlled by medicine. °· You have heat and irritation over the fluid-filled sac. °MAKE SURE YOU:  °· Understand these instructions. °· Will watch your condition. °· Will get help right away if you are not doing well or get worse. °Document Released: 12/12/2009 Document Revised: 09/16/2011 Document Reviewed: 09/13/2013 °ExitCare® Patient Information ©2015 [employer]. This information is not intended to replace advice given to you by your health care provider. Make sure you discuss any questions you have with your health care provider. ° °

## 2014-08-16 NOTE — ED Notes (Signed)
Pt c/o right shoulder  pain x 1 week, denies injury

## 2014-08-16 NOTE — ED Notes (Signed)
D/c home with referral to Dr Pearletha Forge. Pt directed to pharmacy to pick up meds

## 2014-08-22 ENCOUNTER — Ambulatory Visit (INDEPENDENT_AMBULATORY_CARE_PROVIDER_SITE_OTHER): Payer: 59 | Admitting: Family Medicine

## 2014-08-22 ENCOUNTER — Encounter: Payer: Self-pay | Admitting: Family Medicine

## 2014-08-22 VITALS — BP 109/74 | HR 66 | Ht 74.0 in | Wt 255.0 lb

## 2014-08-22 DIAGNOSIS — M25511 Pain in right shoulder: Secondary | ICD-10-CM

## 2014-08-22 MED ORDER — HYDROCODONE-ACETAMINOPHEN 5-325 MG PO TABS
1.0000 | ORAL_TABLET | Freq: Four times a day (QID) | ORAL | Status: DC | PRN
Start: 2014-08-22 — End: 2015-11-30

## 2014-08-22 NOTE — Patient Instructions (Addendum)
Your shoulder pain is due to a combination of things - impingement, bursitis, mild instability though they are all treated similarly. Try to avoid painful activities (overhead activities, lifting with extended arm) as much as possible. Aleve 2 tabs twice a day with food OR ibuprofen 3 tabs three times a day with food for pain and inflammation (only if you're not taking your blood thinner). Norco as needed for severe pain. Subacromial injection may be beneficial to help with pain and to decrease inflammation. Consider physical therapy with transition to home exercise program. Do home exercise program with theraband and scapular stabilization exercises daily - these are very important for long term relief even if an injection was given. If not improving at follow-up we will consider further imaging, physical therapy and/or nitro patches. Follow up with me in 1 month to 6 weeks.

## 2014-08-24 DIAGNOSIS — M25511 Pain in right shoulder: Secondary | ICD-10-CM | POA: Insufficient documentation

## 2014-08-24 NOTE — Assessment & Plan Note (Signed)
no obvious injury.  Has findings consistent with multiple issues - impingement, mild instability, subacromial bursitis.  Start home exercise program which was reviewed today.  Consider physical therapy.  Declined subacromial injection.  He admitted he is not taking his xarelto (or, rather, rarely taking it) despite his low EF and history of a CVA - I strongly encouraged him to take this and follow up with his cardiologist regarding this.  He could take nsaids only if not taking blood thinners.  F/u in 1 month to 6 weeks.

## 2014-08-24 NOTE — Progress Notes (Signed)
PCP: Pcp Not In System  Subjective:   HPI: Patient is a 34 y.o. male here for right shoulder pain.  Patient reports he was playing basketball about 3 weeks ago when he started to get throbbing and aching in his right shoulder. Did not fall on this or suffer an obvious injury. Is right handed. No prior issues with this shoulder. Works with [employer] with [employer]. Difficulty pulling cover up when lying in bed. + night pain  Past Medical History  Diagnosis Date  . Cough     a. PFTS 09/2012: restriction probable, further examinations recommended. Saw pulm 11/2012: placed on GERD rx and short course prednisone, planned to regroup.  . LV dysfunction     a. Identified 10/2012 by echo EF 20-25%. b. Echo 01/2013: EF 20-25%, TEE EF 15% with severe RV dysfunction as well.  [name] History of palpitations     2x/month for several years feels heart racing  . CVA (cerebral vascular accident) 01/23/2013    tPA administered; MRI brain showed small acute R MCA infarct  . PFO (patent foramen ovale)     a. By TEE 01/2013.  [name] CHF (congestive heart failure)     Current Outpatient Prescriptions on File Prior to Visit  Medication Sig Dispense Refill  . atorvastatin (LIPITOR) 10 MG tablet Take 1 tablet (10 mg total) by mouth daily at 6 PM. 31 tablet 11  . carvedilol (COREG) 25 MG tablet Take 1 tablet (25 mg total) by mouth 2 (two) times daily. 62 tablet 11  . losartan (COZAAR) 50 MG tablet Take 1 tablet (50 mg total) by mouth daily. 31 tablet 11  . rivaroxaban (XARELTO) 20 MG TABS tablet Take 1 tablet (20 mg total) by mouth daily. 31 tablet 11  . spironolactone (ALDACTONE) 25 MG tablet Take 1 tablet (25 mg total) by mouth daily. 31 tablet 11   No current facility-administered medications on file prior to visit.    Past Surgical History  Procedure Laterality Date  . No past surgeries    . Tee without cardioversion N/A 01/26/2013    Procedure: TRANSESOPHAGEAL ECHOCARDIOGRAM (TEE);  Surgeon: [name], MD;  Location: [facility] ENDOSCOPY;  Service: Cardiovascular;  Laterality: N/A;    No Known Allergies  History   Social History  . Marital Status: Single    Spouse Name: N/A  . Number of Children: N/A  . Years of Education: N/A   Occupational History  . Not on file.   Social History Main Topics  . Smoking status: Never Smoker   . Smokeless tobacco: Not on file  . Alcohol Use: No  . Drug Use: No  . Sexual Activity: Yes   Other Topics Concern  . Not on file   Social History Narrative   Marital status: single     Children: 3 children (103, 1, 31month old)      Lives: with girlfriend.      Employment: works for city [employer]      Tobacco: none      Alcohol: none      Drugs: none      Exercise: basketball every Friday night.    Family History  Problem Relation Age of Onset  . Sarcoidosis Mother   . Heart Problems Mother     PPM implantation age 67    BP 109/74 mmHg  Pulse 66  Ht  (1.88 m)  Wt 255 lb (115.667 kg)  BMI 32.73 kg/m2  Review of Systems:  See HPI above.    Objective:  Physical Exam:  Gen: NAD  Right shoulder: No swelling, ecchymoses.  No gross deformity. No TTP AC joint.  Mild tenderness biceps tendon. FROM with painful arc. Mild positive Hawkins, Neers. Negative Speeds, Yergasons. Strength 5/5 with empty can and resisted internal/external rotation. Positive apprehension. Negative sulcus. Negative o'briens. NV intact distally.    Assessment & Plan:  1. Right shoulder pain - no obvious injury.  Has findings consistent with multiple issues - impingement, mild instability, subacromial bursitis.  Start home exercise program which was reviewed today.  Consider physical therapy.  Declined subacromial injection.  He admitted he is not taking his xarelto (or, rather, rarely taking it) despite his low EF and history of a CVA - I strongly encouraged him to take this and follow up with his cardiologist regarding this.  He  could take nsaids only if not taking blood thinners.  F/u in 1 month to 6 weeks.

## 2014-08-25 ENCOUNTER — Ambulatory Visit (INDEPENDENT_AMBULATORY_CARE_PROVIDER_SITE_OTHER): Payer: 59 | Admitting: Cardiovascular Disease

## 2014-08-25 ENCOUNTER — Encounter: Payer: Self-pay | Admitting: Nurse Practitioner

## 2014-08-25 ENCOUNTER — Encounter: Payer: Self-pay | Admitting: Cardiovascular Disease

## 2014-08-25 VITALS — BP 104/82 | HR 69 | Ht 74.0 in | Wt 264.0 lb

## 2014-08-25 DIAGNOSIS — I5022 Chronic systolic (congestive) heart failure: Secondary | ICD-10-CM

## 2014-08-25 NOTE — Progress Notes (Signed)
Cardiology Office Note   Date:  08/25/2014   ID:  TRONG EIDAM, DOB [birth year], MRN [id number]  PCP:  Pcp Not In System  Cardiologist:   Honest Safranek, Deloris Ping, MD   Chief Complaint  Patient presents with  . Follow-up    CHF   1. Congestive heart failure-ejection fraction of 20-25% by echo 2. Restrictive lung disease 3. CVA 4. Right shoulder injury-  History of Present Illness:  Shashank is a 34 year old gentleman who is a new consultation for further evaluation of his cough. He had an echocardiogram and was found to have ejection fraction of between 20 and 25%. I started him on Lasix 40 mg a day as well as potassium chloride 20 mg a day. He developed severe orthostatic hypotension and felt very poorly after taking one dose Lasix and he stopped.  Surprisingly, he does not have much shortness of breath. His basketball on a regular basis. He is able to play for hours at a time and does not have any significant shortness of breath. He does have a cough. He's tried a Z-Pak which cleared up the greenish color of his sputum but he is still having this cough.  He denies any PND orthopnea. He denies any syncope or presyncope. He denies any chest pain. He does not have any cardiac history.  Dec 02, 2012: Eathel is feeling a bit better. His cough has improved. He still has episodes of profound fatigue. His breathing is better. He has greatly reduced his salt intake. He still plays basket ball on a regular basis.   March 10, 2013: Nov. 3, 2014: Swarit has gained some weight since his last ov. He complains of a slight head ache / dizziness. He has occasional episodes of orthostasis. Otherwise, he is feeling well - breathing well.   Jan. 30, 2015: Makena is doing OK. He played basketball 2 weeks ago and felt great. He was able to play without dyspnea.  His most recent echocardiogram in November revealed improvement of his left ventricular systolic function. The official reading on the  echo reports an ejection fraction of 30%  Reviewed the echo and I think that his ejection fraction is actually a little bit better- at least 35%. In one view, the Simpson calculation reported 42%.  October 08, 2013: Leonid seems to be doing fairly well. We increased his losartan to 100 mg a day during his last visit but he started having episodes of lightheadedness any developed a headache. The symptoms resolved when he decreased his dose back down to the 50 mg where we were previously.  Nov 09, 2013: Cardin is doing ok.   December 10, 2013: dilon is doing well  Sept. 8, 2015: Tyrick is doing well. No dyspnea. His last echocardiogram revealed his ejection fraction had decreased back down to 25%. He thinks that his EF has gone back up because he is not having shortness breath and his cough is better.  We have referred him to [name] for consideration for an ICD. He does not have a left bundle branch block so I do not think that he needs a CRT.   Jan. 8, 2016: Jarick had another TIA recently. Was in the hospital. CT showed no new CVA He had stopped his Xarelto.[name] He has run out of his Xarelto again. Also ran out of the losartan or another med. He is feeling much better.   Feb. 18, 2016:  SERGE BAYERL is a 34 y.o. male who presents for follow  up of his chronic systolic CHf.  He has a right injury - needs to take NSAID.  His sports medicine doctor would not give him anything yet.    Past Medical History  Diagnosis Date  . Cough     a. PFTS 09/2012: restriction probable, further examinations recommended. Saw pulm 11/2012: placed on GERD rx and short course prednisone, planned to regroup.  . LV dysfunction     a. Identified 10/2012 by echo EF 20-25%. b. Echo 01/2013: EF 20-25%, TEE EF 15% with severe RV dysfunction as well.  [name] History of palpitations     2x/month for several years feels heart racing  . CVA (cerebral vascular accident) 01/23/2013    tPA administered; MRI brain showed small acute R  MCA infarct  . PFO (patent foramen ovale)     a. By TEE 01/2013.  [name] CHF (congestive heart failure)     Past Surgical History  Procedure Laterality Date  . No past surgeries    . Tee without cardioversion N/A 01/26/2013    Procedure: TRANSESOPHAGEAL ECHOCARDIOGRAM (TEE);  Surgeon: [name], MD;  Location: [facility] ENDOSCOPY;  Service: Cardiovascular;  Laterality: N/A;     Current Outpatient Prescriptions  Medication Sig Dispense Refill  . atorvastatin (LIPITOR) 10 MG tablet Take 1 tablet (10 mg total) by mouth daily at 6 PM. 31 tablet 11  . carvedilol (COREG) 25 MG tablet Take 1 tablet (25 mg total) by mouth 2 (two) times daily. 62 tablet 11  . HYDROcodone-acetaminophen (NORCO/VICODIN) 5-325 MG per tablet Take 1 tablet by mouth every 6 (six) hours as needed for moderate pain or severe pain. 40 tablet 0  . losartan (COZAAR) 50 MG tablet Take 1 tablet (50 mg total) by mouth daily. 31 tablet 11  . rivaroxaban (XARELTO) 20 MG TABS tablet Take 1 tablet (20 mg total) by mouth daily. 31 tablet 11  . spironolactone (ALDACTONE) 25 MG tablet Take 1 tablet (25 mg total) by mouth daily. 31 tablet 11   No current facility-administered medications for this visit.    Allergies:   Review of patient's allergies indicates no known allergies.    Social History:  The patient  reports that he has never smoked. He does not have any smokeless tobacco history on file. He reports that he does not drink alcohol or use illicit drugs.   Family History:  The patient's family history includes Heart Problems in his mother; Sarcoidosis in his mother.    ROS:  Please see the history of present illness.    Review of Systems: Constitutional:  denies fever, chills, diaphoresis, appetite change and fatigue.  HEENT: denies photophobia, eye pain, redness, hearing loss, ear pain, congestion, sore throat, rhinorrhea, sneezing, neck pain, neck stiffness and tinnitus.  Respiratory: denies SOB, DOE, cough, chest  tightness, and wheezing.  Cardiovascular: denies chest pain, palpitations and leg swelling.  Gastrointestinal: denies nausea, vomiting, abdominal pain, diarrhea, constipation, blood in stool.  Genitourinary: denies dysuria, urgency, frequency, hematuria, flank pain and difficulty urinating.  Musculoskeletal: denies  myalgias, back pain, joint swelling, arthralgias and gait problem.   Skin: denies pallor, rash and wound.  Neurological: denies dizziness, seizures, syncope, weakness, light-headedness, numbness and headaches.   Hematological: denies adenopathy, easy bruising, personal or family bleeding history.  Psychiatric/ Behavioral: denies suicidal ideation, mood changes, confusion, nervousness, sleep disturbance and agitation.       All other systems are reviewed and negative.    PHYSICAL EXAM: VS:  BP 104/82 mmHg  Pulse 69  Ht  (1.88 m)  Wt 264 lb (119.75 kg)  BMI 33.88 kg/m2  SpO2 99% , BMI Body mass index is 33.88 kg/(m^2). GEN: Well nourished, well developed, in no acute distress HEENT: normal Neck: no JVD, carotid bruits, or masses Cardiac: RRR; no murmurs, rubs, or gallops,no edema  Respiratory:  clear to auscultation bilaterally, normal work of breathing GI: soft, nontender, nondistended, + BS MS: no deformity or atrophy Skin: warm and dry, no rash Neuro:  Strength and sensation are intact Psych: normal   EKG:  EKG is not ordered today.    Recent Labs: 05/19/2014: Pro B Natriuretic peptide (BNP) 10.8 05/20/2014: ALT 22; Hemoglobin 16.2; Platelets 273 08/04/2014: BUN 14; Creatinine 1.12; Potassium 3.8; Sodium 140    Lipid Panel    Component Value Date/Time   CHOL 162 05/20/2014 0026   TRIG 141 05/20/2014 0026   HDL 45 05/20/2014 0026   CHOLHDL 3.6 05/20/2014 0026   VLDL 28 05/20/2014 0026   LDLCALC 89 05/20/2014 0026      Wt Readings from Last 3 Encounters:  08/25/14 264 lb (119.75 kg)  08/22/14 255 lb (115.667 kg)  08/16/14 250 lb (113.399 kg)        Other studies Reviewed: Additional studies/ records that were reviewed today include: . Review of the above records demonstrates:    ASSESSMENT AND PLAN:  1. Chronic systolic Congestive heart failure-ejection fraction of 20-25% by echo - he is basically asymptomatic . Still plays basketball and is very active.   We've discussed the role of ICD in patients with low EF.  He understands. We will get an echocardiogram Anaprox A6 to 8 weeks. I'll see him in the office approximately one week after that.  2. Restrictive lung disease 3. CVA - he has an EF of 20% .  Has had a CVA 4. Right shoulder injury.  Current medicines are reviewed at length with the patient today.  The patient does not have concerns regarding medicines.  The following changes have been made:  no change   Disposition:   FU with me in 8 weeks.    Signed, Shanita Kanan, Deloris Ping, MD  08/25/2014 11:53 AM    [facility] [address] [place], [place], [place]  [address] Phone: [phone]; Fax: [phone]

## 2014-08-25 NOTE — Patient Instructions (Addendum)
Your physician has requested that you have an echocardiogram in 8 weeks before your appointment with Dr. Elease Hashimoto. Echocardiography is a painless test that uses sound waves to create images of your heart. It provides your doctor with information about the size and shape of your heart and how well your heart's chambers and valves are working. This procedure takes approximately one hour. There are no restrictions for this procedure. ECHO MUST BE DONE AFTER April 8  Your physician recommends that you continue on your current medications as directed. Please refer to the Current Medication list given to you today.  Your physician recommends that you schedule a follow-up appointment in: 8 weeks with Dr. Elease Hashimoto after your echocardiogram.

## 2014-09-09 ENCOUNTER — Ambulatory Visit: Payer: 59 | Attending: Family Medicine | Admitting: Physical Therapy

## 2014-09-09 DIAGNOSIS — M25511 Pain in right shoulder: Secondary | ICD-10-CM | POA: Insufficient documentation

## 2014-09-09 DIAGNOSIS — R29898 Other symptoms and signs involving the musculoskeletal system: Secondary | ICD-10-CM | POA: Insufficient documentation

## 2014-09-20 DIAGNOSIS — I428 Other cardiomyopathies: Secondary | ICD-10-CM | POA: Insufficient documentation

## 2014-09-20 DIAGNOSIS — R002 Palpitations: Secondary | ICD-10-CM | POA: Insufficient documentation

## 2014-09-20 DIAGNOSIS — E782 Mixed hyperlipidemia: Secondary | ICD-10-CM | POA: Insufficient documentation

## 2014-09-23 ENCOUNTER — Ambulatory Visit: Payer: 59 | Admitting: Physical Therapy

## 2014-09-23 DIAGNOSIS — R29898 Other symptoms and signs involving the musculoskeletal system: Secondary | ICD-10-CM | POA: Diagnosis not present

## 2014-09-23 DIAGNOSIS — M25511 Pain in right shoulder: Secondary | ICD-10-CM

## 2014-09-23 DIAGNOSIS — M25611 Stiffness of right shoulder, not elsewhere classified: Secondary | ICD-10-CM

## 2014-09-23 NOTE — Therapy (Signed)
[facility] Outpatient Rehabilitation [facility] [address]  [address] [place], [place], [address] Phone: [phone]   Fax:  [phone]  Physical Therapy Evaluation  Patient Details  Name: Nathan Baird MRN: [id number] Date of Birth: December 07, 1980 Referring Provider:  Lenda Kelp, MD  Encounter Date: 09/23/2014      PT End of Session - 09/23/14 1147    Visit Number 1   Number of Visits 12   Date for PT Re-Evaluation 11/04/14   PT Start Time 1110   PT Stop Time 1139   PT Time Calculation (min) 29 min   Activity Tolerance Patient tolerated treatment well   Behavior During Therapy [facility] for tasks assessed/performed      Past Medical History  Diagnosis Date  . Cough     a. PFTS 09/2012: restriction probable, further examinations recommended. Saw pulm 11/2012: placed on GERD rx and short course prednisone, planned to regroup.  . LV dysfunction     a. Identified 10/2012 by echo EF 20-25%. b. Echo 01/2013: EF 20-25%, TEE EF 15% with severe RV dysfunction as well.  [name] History of palpitations     2x/month for several years feels heart racing  . CVA (cerebral vascular accident) 01/23/2013    tPA administered; MRI brain showed small acute R MCA infarct  . PFO (patent foramen ovale)     a. By TEE 01/2013.  [name] CHF (congestive heart failure)     Past Surgical History  Procedure Laterality Date  . No past surgeries    . Tee without cardioversion N/A 01/26/2013    Procedure: TRANSESOPHAGEAL ECHOCARDIOGRAM (TEE);  Surgeon: [name], MD;  Location: [facility] ENDOSCOPY;  Service: Cardiovascular;  Laterality: N/A;    There were no vitals filed for this visit.  Visit Diagnosis:  Pain in right shoulder - Plan: PT plan of care cert/re-cert  Weakness of right upper extremity - Plan: PT plan of care cert/re-cert  Decreased range of motion of shoulder, right - Plan: PT plan of care cert/re-cert      Subjective Assessment - 09/23/14 1114    Symptoms Pt is a 34 y/o male  who presents to OPPT for R shoulder pain x 6 weeks without resolve in symptoms.  Pt reports symptoms began initially as soreness and unsure if symptoms came from playing basketball or lifting at work.     Pertinent History CVA/TIA, CHF   Limitations Lifting;House hold activities   Diagnostic tests xrays negative   Patient Stated Goals improve pain; play basketball   Currently in Pain? Yes   Pain Score 5   can increase to 7-8/10   Pain Location Shoulder   Pain Orientation Right   Pain Descriptors / Indicators Throbbing   Pain Type --  subacute   Pain Onset More than a month ago   Pain Frequency Constant   Aggravating Factors  basketball, pulling covers up in bed   Pain Relieving Factors immobilization            [facility] PT Assessment - 09/23/14 1118    Assessment   Medical Diagnosis R shoulder pain   Onset Date 08/12/14   Next MD Visit April   Prior Therapy given exercises from MD office   Precautions   Precautions None   Restrictions   Weight Bearing Restrictions No   Balance Screen   Has the patient fallen in the past 6 months No   Has the patient had a decrease in activity level because of a  fear of falling?  No   Is the patient reluctant to leave their home because of a fear of falling?  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# (50# is rare per pt)   Leisure basketball   Cognition   Overall Cognitive Status Within Functional Limits for tasks assessed   Observation/Other Assessments   Focus on Therapeutic Outcomes (FOTO)  65 (45% limited; predicted 35% limited)   Posture/Postural Control   Posture/Postural Control Postural limitations   Postural Limitations Rounded Shoulders;Forward head;Increased thoracic kyphosis    AROM   Overall AROM Comments All other shoulder motions WNL except listed below   AROM Assessment Site Shoulder   Right/Left Shoulder Right;Left   Right Shoulder ABduction 133 Degrees   Right Shoulder Internal Rotation --  FIR WNL   Left Shoulder ABduction 145 Degrees   Left Shoulder Internal Rotation --  FIR to T11/12   Strength   Strength Assessment Site Shoulder;Elbow;Hand   Right/Left Shoulder Right;Left   Right Shoulder Flexion 3+/5   Right Shoulder ABduction 3+/5   Right Shoulder Internal Rotation 5/5   Right Shoulder External Rotation 4/5   Left Shoulder Flexion 4/5   Left Shoulder ABduction 4/5   Left Shoulder Internal Rotation 5/5   Left Shoulder External Rotation 5/5   Right/Left Elbow Right;Left   Right Elbow Flexion 5/5   Right Elbow Extension 5/5   Left Elbow Flexion 5/5   Left Elbow Extension 5/5   Palpation   Palpation tenderness to palpation along R clavicle ant and post   Special Tests    Special Tests Rotator Cuff Impingement   Rotator Cuff Impingment tests Leanord Asal test   Hawkins-Kennedy test   Findings Positive   Side Right                           PT Education - 09/23/14 1147    Education provided Yes   Education Details clinical findings; postural limitations.  Recommended scap retraction and shoulder rolls bil for posture.   Person(s) Educated Patient   Methods Explanation;Demonstration   Comprehension Verbalized understanding;Need further instruction             PT Long Term Goals - 09/23/14 1150    PT LONG TERM GOAL #1   Title independent with HEP (11/04/14)   Time 6   Period Weeks   Status New   PT LONG TERM GOAL #2   Title verbalize understanding of posture/body mechanics to reduce risk of reinjury (11/04/14)   Time 6   Period Weeks   Status New   PT LONG TERM GOAL #3   Title perform R shoulder IR and abduction full range without pain (11/04/14)   Time 6   Period Weeks   Status New   PT LONG TERM  GOAL #4   Title improve R shoulder strength to at least 4/5 for improved strength and function (11/04/14)   Time 6   Period Weeks   Status New   PT LONG TERM GOAL #5   Title report ability to pull up bed covers with RUE without pain (11/04/14)   Time 6   Period Weeks   Status New  Plan - 09/23/14 1147    Clinical Impression Statement Pt presents to OPPT with likely R shoulder impingement affecting function and daily activities.  Pt will benefit from PT to decrease pain and improve posture and strength to perform ADLs and work responsibilities.  Pt reports he is not motivated to perform HEP at home and educated on need for compliance with HEP to maximize function.   Pt will benefit from skilled therapeutic intervention in order to improve on the following deficits Decreased strength;Impaired UE functional use;Pain;Decreased activity tolerance;Postural dysfunction;Improper body mechanics;Decreased range of motion   Rehab Potential Good   PT Frequency 2x / week   PT Duration 6 weeks   PT Treatment/Interventions ADLs/Self Care Home Management;Electrical Stimulation;Moist Heat;Cryotherapy;Ultrasound;Therapeutic exercise;Therapeutic activities;Functional mobility training;Neuromuscular re-education;Patient/family education;Passive range of motion;Manual techniques  iontophoresis   PT Next Visit Plan modalities for pain; ionto if order signed; postural exercises (strengthening and stretching)   Consulted and Agree with Plan of Care Patient         Problem List Patient Active Problem List   Diagnosis Date Noted  . Right shoulder pain 08/24/2014  . TIA (transient ischemic attack) 05/20/2014  . History of CVA (cerebrovascular accident)   . Essential hypertension   . HLD (hyperlipidemia)   . Encounter for long-term (current) use of high-risk medication 01/27/2013  . Patent foramen ovale with right to left shunt 01/27/2013  . Stroke, acute, embolic 01/25/2013  . Cough  10/20/2012  . Chronic systolic congestive heart failure 10/20/2012  . Penile pain 05/19/2012  . ATTENTION DEFICIT, W/HYPERACTIVITY 09/04/2006   Clarita Crane, PT, DPT 09/23/2014 11:55 AM  [facility] [address]  [address] [place], [place], [address] Phone: [phone]   Fax:  [phone]

## 2014-09-26 ENCOUNTER — Ambulatory Visit: Payer: 59 | Admitting: Physical Therapy

## 2014-09-26 DIAGNOSIS — M25611 Stiffness of right shoulder, not elsewhere classified: Secondary | ICD-10-CM

## 2014-09-26 DIAGNOSIS — R29898 Other symptoms and signs involving the musculoskeletal system: Secondary | ICD-10-CM

## 2014-09-26 DIAGNOSIS — M25511 Pain in right shoulder: Secondary | ICD-10-CM | POA: Diagnosis not present

## 2014-09-26 NOTE — Therapy (Signed)
[facility] Outpatient Rehabilitation [facility] [address]  [address] [place], [place], [address] Phone: [phone]   Fax:  [phone]  Physical Therapy Treatment  Patient Details  Name: Nathan Baird MRN: [id number] Date of Birth: [birth year] Referring Provider:  Lenda Kelp, MD  Encounter Date: 09/26/2014      PT End of Session - 09/26/14 1609    Visit Number 2   Number of Visits 12   Date for PT Re-Evaluation 11/04/14   PT Start Time 1530   PT Stop Time 1608   PT Time Calculation (min) 38 min   Activity Tolerance Patient tolerated treatment well   Behavior During Therapy [facility] for tasks assessed/performed      Past Medical History  Diagnosis Date  . Cough     a. PFTS 09/2012: restriction probable, further examinations recommended. Saw pulm 11/2012: placed on GERD rx and short course prednisone, planned to regroup.  . LV dysfunction     a. Identified 10/2012 by echo EF 20-25%. b. Echo 01/2013: EF 20-25%, TEE EF 15% with severe RV dysfunction as well.  [name] History of palpitations     2x/month for several years feels heart racing  . CVA (cerebral vascular accident) 01/23/2013    tPA administered; MRI brain showed small acute R MCA infarct  . PFO (patent foramen ovale)     a. By TEE 01/2013.  [name] CHF (congestive heart failure)     Past Surgical History  Procedure Laterality Date  . No past surgeries    . Tee without cardioversion N/A 01/26/2013    Procedure: TRANSESOPHAGEAL ECHOCARDIOGRAM (TEE);  Surgeon: [name], MD;  Location: [facility] ENDOSCOPY;  Service: Cardiovascular;  Laterality: N/A;    There were no vitals filed for this visit.  Visit Diagnosis:  Pain in right shoulder  Weakness of right upper extremity  Decreased range of motion of shoulder, right      Subjective Assessment - 09/26/14 1534    Symptoms R shoulder feels "alright" pain is moderate today   Pertinent History CVA/TIA, CHF   Limitations Lifting;House hold activities   Diagnostic tests xrays negative   Patient Stated Goals improve pain; play basketball   Pain Score 2    Pain Location Shoulder   Pain Orientation Right   Pain Descriptors / Indicators Throbbing   Pain Type --  subacute   Pain Onset More than a month ago   Pain Frequency Constant                       [facility] Adult PT Treatment/Exercise - 09/26/14 1533    Neck Exercises: Seated   Shoulder Rolls Backwards;15 reps   Shoulder Exercises: Seated   Retraction Both;15 reps   Shoulder Exercises: ROM/Strengthening   UBE (Upper Arm Bike) Level 1.5 x 8 min alt forward/2 min backward   Shoulder Exercises: Stretch   Corner Stretch 3 reps;30 seconds   Internal Rotation Stretch 3 reps   Internal Rotation Stretch Limitations 30 sec; sleeper stretch   Other Shoulder Stretches supine on 1/2 foam roll with arms out x 5 min   Modalities   Modalities Iontophoresis   Iontophoresis   Type of Iontophoresis Dexamethasone   Location R shoulder   Dose 1.0 ml   Time stat patch x 6 hours                PT Education - 09/26/14 1609    Education provided Yes  Education Details ionto   Person(s) Educated Patient   Methods Explanation;Handout   Comprehension Verbalized understanding             PT Long Term Goals - 09/26/14 1610    PT LONG TERM GOAL #1   Title independent with HEP (11/04/14)   Status On-going   PT LONG TERM GOAL #2   Title verbalize understanding of posture/body mechanics to reduce risk of reinjury (11/04/14)   Status On-going   PT LONG TERM GOAL #3   Title perform R shoulder IR and abduction full range without pain (11/04/14)   Status On-going   PT LONG TERM GOAL #4   Title improve R shoulder strength to at least 4/5 for improved strength and function (11/04/14)   Status On-going   PT LONG TERM GOAL #5   Title report ability to pull up bed covers with RUE without pain (11/04/14)   Status On-going               Plan - 09/26/14 1609     Clinical Impression Statement Continues to have L shoulder pain; only 2nd visit.  Trialed ionto to determine if pt reports improved symptoms.   PT Next Visit Plan modalities for pain; ionto if helped; postural exercises (strengthening and stretching)   Consulted and Agree with Plan of Care Patient        Problem List Patient Active Problem List   Diagnosis Date Noted  . Right shoulder pain 08/24/2014  . TIA (transient ischemic attack) 05/20/2014  . History of CVA (cerebrovascular accident)   . Essential hypertension   . HLD (hyperlipidemia)   . Encounter for long-term (current) use of high-risk medication 01/27/2013  . Patent foramen ovale with right to left shunt 01/27/2013  . Stroke, acute, embolic 01/25/2013  . Cough 10/20/2012  . Chronic systolic congestive heart failure 10/20/2012  . Penile pain 05/19/2012  . ATTENTION DEFICIT, W/HYPERACTIVITY 09/04/2006   Clarita Crane, PT, DPT 09/26/2014 4:11 PM  [facility] [address]  [address] [place], [place], [address] Phone: [phone]   Fax:  [phone]

## 2014-09-28 ENCOUNTER — Ambulatory Visit: Payer: 59 | Admitting: Rehabilitation

## 2014-09-28 DIAGNOSIS — M25611 Stiffness of right shoulder, not elsewhere classified: Secondary | ICD-10-CM

## 2014-09-28 DIAGNOSIS — R29898 Other symptoms and signs involving the musculoskeletal system: Secondary | ICD-10-CM

## 2014-09-28 DIAGNOSIS — M25511 Pain in right shoulder: Secondary | ICD-10-CM | POA: Diagnosis not present

## 2014-09-28 NOTE — Therapy (Signed)
[facility] [address]  [address] [place], [place], [address] Phone: [phone]   Fax:  [phone]  Physical Therapy Treatment  Patient Details  Name: Nathan Baird MRN: [id number] Date of Birth: June 14, 1981 Referring Provider:  Lenda Kelp, MD  Encounter Date: 09/28/2014      PT End of Session - 09/28/14 1402    Visit Number 3   Number of Visits 12   Date for PT Re-Evaluation 11/04/14   PT Start Time 1402   PT Stop Time 1440   PT Time Calculation (min) 38 min   Activity Tolerance Patient tolerated treatment well   Behavior During Therapy [facility] for tasks assessed/performed      Past Medical History  Diagnosis Date  . Cough     a. PFTS 09/2012: restriction probable, further examinations recommended. Saw pulm 11/2012: placed on GERD rx and short course prednisone, planned to regroup.  . LV dysfunction     a. Identified 10/2012 by echo EF 20-25%. b. Echo 01/2013: EF 20-25%, TEE EF 15% with severe RV dysfunction as well.  [name] History of palpitations     2x/month for several years feels heart racing  . CVA (cerebral vascular accident) 01/23/2013    tPA administered; MRI brain showed small acute R MCA infarct  . PFO (patent foramen ovale)     a. By TEE 01/2013.  [name] CHF (congestive heart failure)     Past Surgical History  Procedure Laterality Date  . No past surgeries    . Tee without cardioversion N/A 01/26/2013    Procedure: TRANSESOPHAGEAL ECHOCARDIOGRAM (TEE);  Surgeon: [name], MD;  Location: [facility] ENDOSCOPY;  Service: Cardiovascular;  Laterality: N/A;    There were no vitals filed for this visit.  Visit Diagnosis:  Pain in right shoulder  Weakness of right upper extremity  Decreased range of motion of shoulder, right      Subjective Assessment - 09/28/14 1406    Symptoms Notes some throbbing when wearing the patch but still wants to continue to use it.    Currently in Pain? Yes   Pain Score 1    Pain Location Shoulder   Pain Orientation Right                       [facility] Adult PT Treatment/Exercise - 09/28/14 1402    Exercises   Exercises Shoulder   Shoulder Exercises: Supine   Other Supine Exercises 1/2 foam roll: Pec stretch x3 minutes, bil pullover 4# x10, bil horz abd green TB x10, bil ER green TB x10   Shoulder Exercises: Sidelying   Other Sidelying Exercises Rt open book 3 second hold x10   Shoulder Exercises: Therapy Ball   Flexion 10 reps  pball roll up wall with lift off   Shoulder Exercises: ROM/Strengthening   UBE (Upper Arm Bike) Level 1.5 x 8 min alt forward/2 min backward   Shoulder Exercises: Stretch   Corner Stretch 3 reps;30 seconds   Modalities   Modalities Iontophoresis   Iontophoresis   Type of Iontophoresis Dexamethasone   Location R shoulder   Dose 1.0 ml   Time stat patch x 6 hours (2/6)   Manual Therapy   Manual Therapy Joint mobilization;Myofascial release   Joint Mobilization To Rt AC joint area gr II-III   Myofascial Release Crossfriction to supraspinatus, pec  PT Long Term Goals - 09/26/14 1610    PT LONG TERM GOAL #1   Title independent with HEP (11/04/14)   Status On-going   PT LONG TERM GOAL #2   Title verbalize understanding of posture/body mechanics to reduce risk of reinjury (11/04/14)   Status On-going   PT LONG TERM GOAL #3   Title perform R shoulder IR and abduction full range without pain (11/04/14)   Status On-going   PT LONG TERM GOAL #4   Title improve R shoulder strength to at least 4/5 for improved strength and function (11/04/14)   Status On-going   PT LONG TERM GOAL #5   Title report ability to pull up bed covers with RUE without pain (11/04/14)   Status On-going               Plan - 09/28/14 1439    Clinical Impression Statement Tried Ionto again per pt request. Good tolerance to exercises with minimal pain noted.    PT Next Visit Plan Try ultrasound,  postural exercises.    Consulted and Agree with Plan of Care Patient        Problem List Patient Active Problem List   Diagnosis Date Noted  . Right shoulder pain 08/24/2014  . TIA (transient ischemic attack) 05/20/2014  . History of CVA (cerebrovascular accident)   . Essential hypertension   . HLD (hyperlipidemia)   . Encounter for long-term (current) use of high-risk medication 01/27/2013  . Patent foramen ovale with right to left shunt 01/27/2013  . Stroke, acute, embolic 01/25/2013  . Cough 10/20/2012  . Chronic systolic congestive heart failure 10/20/2012  . Penile pain 05/19/2012  . ATTENTION DEFICIT, W/HYPERACTIVITY 09/04/2006    Ronney Lion, PTA 09/28/2014, 2:40 PM  [facility] [address]  [address] [place], [place], [address] Phone: [phone]   Fax:  [phone]

## 2014-10-03 ENCOUNTER — Encounter: Payer: Self-pay | Admitting: Family Medicine

## 2014-10-03 ENCOUNTER — Ambulatory Visit: Payer: 59 | Admitting: Rehabilitation

## 2014-10-03 ENCOUNTER — Ambulatory Visit (INDEPENDENT_AMBULATORY_CARE_PROVIDER_SITE_OTHER): Payer: 59 | Admitting: Family Medicine

## 2014-10-03 VITALS — BP 110/72 | HR 80 | Ht 75.0 in | Wt 250.0 lb

## 2014-10-03 DIAGNOSIS — M25511 Pain in right shoulder: Secondary | ICD-10-CM

## 2014-10-03 MED ORDER — METHYLPREDNISOLONE ACETATE 40 MG/ML IJ SUSP
40.0000 mg | Freq: Once | INTRAMUSCULAR | Status: AC
  Administered 2014-10-03: 40 mg via INTRA_ARTICULAR

## 2014-10-03 NOTE — Patient Instructions (Signed)
Continue with your therapy but we will cancel your appointment today since you've gotten an injection. Follow up with me in 4 weeks for reevaluation. If still not improving would consider nitro patches or further imaging.

## 2014-10-05 NOTE — Assessment & Plan Note (Signed)
no obvious injury.  Has findings consistent with multiple issues - impingement, mild instability, subacromial bursitis.  Not improving with PT, home exercise program.  Micah Flesher ahead with subacromial injection and will continue with PT.  If not improving would consider nitro patches or imaging.  After informed written consent, patient was seated on exam table. Right shoulder was prepped with alcohol swab and utilizing posterior approach, patient's right subacromial space was injected with 3:1 marcaine: depomedrol. Patient tolerated the procedure well without immediate complications.

## 2014-10-05 NOTE — Progress Notes (Signed)
PCP: Pcp Not In System  Subjective:   HPI: Patient is a 34 y.o. male here for right shoulder pain.  2/15: Patient reports he was playing basketball about 3 weeks ago when he started to get throbbing and aching in his right shoulder. Did not fall on this or suffer an obvious injury. Is right handed. No prior issues with this shoulder. Works with [employer] with [employer]. Difficulty pulling cover up when lying in bed. + night pain  3/28: Patient reports he doesn't feel any different. Is doing PT and home exercises. + night pain. No new injuries or trauma.  Past Medical History  Diagnosis Date  . Cough     a. PFTS 09/2012: restriction probable, further examinations recommended. Saw pulm 11/2012: placed on GERD rx and short course prednisone, planned to regroup.  . LV dysfunction     a. Identified 10/2012 by echo EF 20-25%. b. Echo 01/2013: EF 20-25%, TEE EF 15% with severe RV dysfunction as well.  [name] History of palpitations     2x/month for several years feels heart racing  . CVA (cerebral vascular accident) 01/23/2013    tPA administered; MRI brain showed small acute R MCA infarct  . PFO (patent foramen ovale)     a. By TEE 01/2013.  [name] CHF (congestive heart failure)     Current Outpatient Prescriptions on File Prior to Visit  Medication Sig Dispense Refill  . atorvastatin (LIPITOR) 10 MG tablet Take 1 tablet (10 mg total) by mouth daily at 6 PM. 31 tablet 11  . carvedilol (COREG) 25 MG tablet Take 1 tablet (25 mg total) by mouth 2 (two) times daily. 62 tablet 11  . HYDROcodone-acetaminophen (NORCO/VICODIN) 5-325 MG per tablet Take 1 tablet by mouth every 6 (six) hours as needed for moderate pain or severe pain. 40 tablet 0  . losartan (COZAAR) 50 MG tablet Take 1 tablet (50 mg total) by mouth daily. 31 tablet 11  . rivaroxaban (XARELTO) 20 MG TABS tablet Take 1 tablet (20 mg total) by mouth daily. 31 tablet 11  . spironolactone (ALDACTONE) 25 MG tablet Take 1 tablet (25 mg  total) by mouth daily. 31 tablet 11   No current facility-administered medications on file prior to visit.    Past Surgical History  Procedure Laterality Date  . No past surgeries    . Tee without cardioversion N/A 01/26/2013    Procedure: TRANSESOPHAGEAL ECHOCARDIOGRAM (TEE);  Surgeon: [name], MD;  Location: [facility] ENDOSCOPY;  Service: Cardiovascular;  Laterality: N/A;    No Known Allergies  History   Social History  . Marital Status: Single    Spouse Name: N/A  . Number of Children: N/A  . Years of Education: N/A   Occupational History  . Not on file.   Social History Main Topics  . Smoking status: Never Smoker   . Smokeless tobacco: Not on file  . Alcohol Use: No  . Drug Use: No  . Sexual Activity: Yes   Other Topics Concern  . Not on file   Social History Narrative   Marital status: single     Children: 3 children (27, 1, 42month old)      Lives: with girlfriend.      Employment: works for city of [employer]      Tobacco: none      Alcohol: none      Drugs: none      Exercise: basketball every Friday night.    Family History  Problem Relation Age of Onset  . Sarcoidosis Mother   . Heart Problems Mother     PPM implantation age 63    BP 110/72 mmHg  Pulse 80  Ht 6\' 3"  (1.905 m)  Wt 250 lb (113.399 kg)  BMI 31.25 kg/m2  Review of Systems: See HPI above.    Objective:  Physical Exam:  Gen: NAD  Right shoulder: No swelling, ecchymoses.  No gross deformity. No TTP AC joint.  Mild tenderness biceps tendon. FROM with painful arc. Mild positive Hawkins, Neers. Negative Speeds, Yergasons. Strength 5/5 with empty can and resisted internal/external rotation. Positive apprehension. Negative sulcus. Negative o'briens. NV intact distally.    Assessment & Plan:  1. Right shoulder pain - no obvious injury.  Has findings consistent with multiple issues - impingement, mild instability, subacromial bursitis.  Not improving with PT,  home exercise program.  Micah Flesher ahead with subacromial injection and will continue with PT.  If not improving would consider nitro patches or imaging.  After informed written consent, patient was seated on exam table. Right shoulder was prepped with alcohol swab and utilizing posterior approach, patient's right subacromial space was injected with 3:1 marcaine: depomedrol. Patient tolerated the procedure well without immediate complications.

## 2014-10-06 ENCOUNTER — Ambulatory Visit: Payer: 59 | Admitting: Rehabilitation

## 2014-10-06 DIAGNOSIS — M25511 Pain in right shoulder: Secondary | ICD-10-CM | POA: Diagnosis not present

## 2014-10-06 DIAGNOSIS — M25611 Stiffness of right shoulder, not elsewhere classified: Secondary | ICD-10-CM

## 2014-10-06 DIAGNOSIS — R29898 Other symptoms and signs involving the musculoskeletal system: Secondary | ICD-10-CM

## 2014-10-06 NOTE — Therapy (Signed)
[facility] [address]  [address] [place], [place], [address] Phone: [phone]   Fax:  [phone]  Physical Therapy Treatment  Patient Details  Name: Nathan Baird MRN: [id number] Date of Birth: 13-Jan-1981 Referring Provider:  Lenda Kelp, MD  Encounter Date: 10/06/2014      PT End of Session - 10/06/14 1320    Visit Number 4   Number of Visits 12   Date for PT Re-Evaluation 11/04/14   PT Start Time 1317   PT Stop Time 1357   PT Time Calculation (min) 40 min      Past Medical History  Diagnosis Date  . Cough     a. PFTS 09/2012: restriction probable, further examinations recommended. Saw pulm 11/2012: placed on GERD rx and short course prednisone, planned to regroup.  . LV dysfunction     a. Identified 10/2012 by echo EF 20-25%. b. Echo 01/2013: EF 20-25%, TEE EF 15% with severe RV dysfunction as well.  [name] History of palpitations     2x/month for several years feels heart racing  . CVA (cerebral vascular accident) 01/23/2013    tPA administered; MRI brain showed small acute R MCA infarct  . PFO (patent foramen ovale)     a. By TEE 01/2013.  [name] CHF (congestive heart failure)     Past Surgical History  Procedure Laterality Date  . No past surgeries    . Tee without cardioversion N/A 01/26/2013    Procedure: TRANSESOPHAGEAL ECHOCARDIOGRAM (TEE);  Surgeon: [name], MD;  Location: [facility] ENDOSCOPY;  Service: Cardiovascular;  Laterality: N/A;    There were no vitals filed for this visit.  Visit Diagnosis:  Pain in right shoulder  Weakness of right upper extremity  Decreased range of motion of shoulder, right      Subjective Assessment - 10/06/14 1319    Symptoms Had injection Monday which has helped. Still notes pain but not as bad as before the shot. "I just want the pain gone."   Currently in Pain? No/denies            [facility] PT Assessment - 10/06/14 1324    AROM   Right/Left Shoulder  Right;Left   Right Shoulder ABduction --  WNL   Right Shoulder Internal Rotation --  FIR T11   Left Shoulder ABduction --  WNL   Left Shoulder Internal Rotation --  FIR WNL   Strength   Right/Left Shoulder Right;Left   Right Shoulder Flexion 4+/5   Right Shoulder ABduction 4/5  Pain in pec area   Left Shoulder Flexion 4+/5   Left Shoulder ABduction 4+/5                   [facility] Adult PT Treatment/Exercise - 10/06/14 1323    Shoulder Exercises: Supine   Flexion 15 reps;Right  Circles at 90 degrees flexion   Shoulder Exercises: Sidelying   ABduction 15 reps;Right  Circles at 90 degrees abduction-slight pain noted   Other Sidelying Exercises Rt open book 3 second hold x10   Shoulder Exercises: Standing   Extension Both;10 reps   Theraband Level (Shoulder Extension) --  Black   Row Both;10 reps   Theraband Level (Shoulder Row) --  Black   Shoulder Exercises: ROM/Strengthening   UBE (Upper Arm Bike) Level 2.0 x 8 min alt forward/2 min backward   Shoulder Exercises: Stretch   Corner Stretch 2 reps;20 seconds  In doorway (mid and high)  Modalities   Modalities Ultrasound   Ultrasound   Ultrasound Location Rt bicep/ant shoulder   Ultrasound Parameters 1. , 20%, 0.8 w/cm2,x8'   Ultrasound Goals Pain   Manual Therapy   Manual Therapy Massage;Myofascial release   Massage Rt bicep, pec area   Myofascial Release Crossfriction to Rt bicep, pec                     PT Long Term Goals - 09/26/14 1610    PT LONG TERM GOAL #1   Title independent with HEP (11/04/14)   Status On-going   PT LONG TERM GOAL #2   Title verbalize understanding of posture/body mechanics to reduce risk of reinjury (11/04/14)   Status On-going   PT LONG TERM GOAL #3   Title perform R shoulder IR and abduction full range without pain (11/04/14)   Status On-going   PT LONG TERM GOAL #4   Title improve R shoulder strength to at least 4/5 for improved strength and function  (11/04/14)   Status On-going   PT LONG TERM GOAL #5   Title report ability to pull up bed covers with RUE without pain (11/04/14)   Status On-going               Plan - 10/06/14 1358    Clinical Impression Statement Improved motion and strength noted today. Tenderness noted at Rt bicep area.    PT Next Visit Plan Continue ultrasound, postural exercises.         Problem List Patient Active Problem List   Diagnosis Date Noted  . Right shoulder pain 08/24/2014  . TIA (transient ischemic attack) 05/20/2014  . History of CVA (cerebrovascular accident)   . Essential hypertension   . HLD (hyperlipidemia)   . Encounter for long-term (current) use of high-risk medication 01/27/2013  . Patent foramen ovale with right to left shunt 01/27/2013  . Stroke, acute, embolic 01/25/2013  . Cough 10/20/2012  . Chronic systolic congestive heart failure 10/20/2012  . Penile pain 05/19/2012  . ATTENTION DEFICIT, W/HYPERACTIVITY 09/04/2006    Ronney Lion, PTA 10/06/2014, 1:59 PM  [facility] [address]  [address] [place], [place], [address] Phone: [phone]   Fax:  [phone]

## 2014-10-11 ENCOUNTER — Ambulatory Visit: Payer: 59 | Attending: Family Medicine | Admitting: Physical Therapy

## 2014-10-11 DIAGNOSIS — M25511 Pain in right shoulder: Secondary | ICD-10-CM | POA: Diagnosis present

## 2014-10-11 DIAGNOSIS — M25611 Stiffness of right shoulder, not elsewhere classified: Secondary | ICD-10-CM

## 2014-10-11 DIAGNOSIS — R29898 Other symptoms and signs involving the musculoskeletal system: Secondary | ICD-10-CM | POA: Insufficient documentation

## 2014-10-11 NOTE — Therapy (Addendum)
[facility] [address]  [place], [place], [address] Phone: [phone]   Fax:  [phone]  Physical Therapy Treatment  Patient Details  Name: Nathan Baird MRN: [id number] Date of Birth: [birth year] Referring Provider:  Dene Gentry, MD  Encounter Date: 10/11/2014      PT End of Session - 10/11/14 1612    Visit Number 5   Number of Visits 12   Date for PT Re-Evaluation 11/04/14   PT Start Time 9379   PT Stop Time 1610   PT Time Calculation (min) 40 min   Activity Tolerance Patient tolerated treatment well   Behavior During Therapy [facility] for tasks assessed/performed      Past Medical History  Diagnosis Date  . Cough     a. PFTS 09/2012: restriction probable, further examinations recommended. Saw pulm 11/2012: placed on GERD rx and short course prednisone, planned to regroup.  . LV dysfunction     a. Identified 10/2012 by echo EF 20-25%. b. Echo 01/2013: EF 20-25%, TEE EF 15% with severe RV dysfunction as well.  [name] History of palpitations     2x/month for several years feels heart racing  . CVA (cerebral vascular accident) 01/23/2013    tPA administered; MRI brain showed small acute R MCA infarct  . PFO (patent foramen ovale)     a. By TEE 01/2013.  [name] CHF (congestive heart failure)     Past Surgical History  Procedure Laterality Date  . No past surgeries    . Tee without cardioversion N/A 01/26/2013    Procedure: TRANSESOPHAGEAL ECHOCARDIOGRAM (TEE);  Surgeon: Lelon Perla, MD;  Location: [facility] ENDOSCOPY;  Service: Cardiovascular;  Laterality: N/A;    There were no vitals filed for this visit.  Visit Diagnosis:  Pain in right shoulder  Decreased range of motion of shoulder, right  Weakness of right upper extremity      Subjective Assessment - 10/11/14 1529    Subjective Feeling a lot better since the injection.  Didn't feel ionto patch was helping at all.  R shoulder still hurting   Pertinent  History CVA/TIA, CHF   Limitations Lifting;House hold activities   Diagnostic tests xrays negative   Patient Stated Goals improve pain; play basketball   Currently in Pain? No/denies                       [facility] Adult PT Treatment/Exercise - 10/11/14 1534    Shoulder Exercises: Standing   External Rotation Strengthening;Right;20 reps;Theraband   Theraband Level (Shoulder External Rotation) Level 3 (Green)   Internal Rotation Strengthening;Right;20 reps;Theraband   Theraband Level (Shoulder Internal Rotation) Level 3 (Green)   Extension Strengthening;Right;20 reps;Theraband   Theraband Level (Shoulder Extension) Level 3 (Green)   Row Strengthening;Right;20 reps;Theraband   Theraband Level (Shoulder Row) Level 3 (Green)   Shoulder Exercises: ROM/Strengthening   UBE (Upper Arm Bike) Level 2.0 x 8 min alt 23mn forward/2 min backward   Cybex Row Limitations 55# 2x10   Wall Pushups 20 reps   Wall Pushups Limitations with serratus punch   Ultrasound   Ultrasound Location R bicep temdon   Ultrasound Parameters 1.0 mHz; 20% DC; 0.8 w/cm2 x 8 min   Ultrasound Goals Pain                     PT Long Term Goals - 09/26/14 1610    PT LONG TERM GOAL #1   Title  independent with HEP (11/04/14)   Status On-going   PT LONG TERM GOAL #2   Title verbalize understanding of posture/body mechanics to reduce risk of reinjury (11/04/14)   Status On-going   PT LONG TERM GOAL #3   Title perform R shoulder IR and abduction full range without pain (11/04/14)   Status On-going   PT LONG TERM GOAL #4   Title improve R shoulder strength to at least 4/5 for improved strength and function (11/04/14)   Status On-going   PT LONG TERM GOAL #5   Title report ability to pull up bed covers with RUE without pain (11/04/14)   Status On-going               Plan - 10/11/14 1612    Clinical Impression Statement Pt reports improved pain this session.  Continues to progress towards  goals.   PT Next Visit Plan Continue ultrasound, postural exercises.    Consulted and Agree with Plan of Care Patient        Problem List Patient Active Problem List   Diagnosis Date Noted  . Right shoulder pain 08/24/2014  . TIA (transient ischemic attack) 05/20/2014  . History of CVA (cerebrovascular accident)   . Essential hypertension   . HLD (hyperlipidemia)   . Encounter for long-term (current) use of high-risk medication 01/27/2013  . Patent foramen ovale with right to left shunt 01/27/2013  . Stroke, acute, embolic 11/22/3356  . Cough 10/20/2012  . Chronic systolic congestive heart failure 10/20/2012  . Penile pain 05/19/2012  . ATTENTION DEFICIT, W/HYPERACTIVITY 09/04/2006   Laureen Abrahams, PT, DPT 10/11/2014 4:13 PM  [facility] [address]  [place], [place], [address] Phone: [phone]   Fax:  [phone]       PHYSICAL THERAPY DISCHARGE SUMMARY  Visits from Start of Care: 5  Current functional level related to goals / functional outcomes: See above   Remaining deficits: Unknown as pt did not return to PT (no showed x 2)   Education / Equipment: HEP  Plan: Patient agrees to discharge.  Patient goals were not met. Patient is being discharged due to not returning since the last visit.  ?????     Laureen Abrahams, PT, DPT 11/01/2014 2:36 PM  [facility] [place] [address]  [phone] (office) [phone] (fax)

## 2014-10-14 ENCOUNTER — Ambulatory Visit: Payer: 59 | Admitting: Rehabilitation

## 2014-10-17 ENCOUNTER — Other Ambulatory Visit: Payer: Self-pay

## 2014-10-17 ENCOUNTER — Telehealth: Payer: Self-pay | Admitting: Family Medicine

## 2014-10-17 ENCOUNTER — Ambulatory Visit (HOSPITAL_COMMUNITY): Payer: 59 | Attending: Cardiology | Admitting: Radiology

## 2014-10-17 DIAGNOSIS — I071 Rheumatic tricuspid insufficiency: Secondary | ICD-10-CM | POA: Insufficient documentation

## 2014-10-17 DIAGNOSIS — I5022 Chronic systolic (congestive) heart failure: Secondary | ICD-10-CM | POA: Diagnosis not present

## 2014-10-17 DIAGNOSIS — I340 Nonrheumatic mitral (valve) insufficiency: Secondary | ICD-10-CM | POA: Insufficient documentation

## 2014-10-17 NOTE — Telephone Encounter (Signed)
If he's feeling well enough to do that we should see him back early to make sure clinically his strength, motion is good enough he's ok to go back to full duty.

## 2014-10-17 NOTE — Telephone Encounter (Signed)
Spoke to patient and scheduled appointment for 10-20-14.

## 2014-10-17 NOTE — Progress Notes (Signed)
Echocardiogram performed.  

## 2014-10-18 ENCOUNTER — Ambulatory Visit: Payer: 59

## 2014-10-20 ENCOUNTER — Encounter: Payer: Self-pay | Admitting: Family Medicine

## 2014-10-20 ENCOUNTER — Ambulatory Visit (INDEPENDENT_AMBULATORY_CARE_PROVIDER_SITE_OTHER): Payer: 59 | Admitting: Family Medicine

## 2014-10-20 VITALS — BP 105/72 | HR 78 | Ht 75.0 in | Wt 252.0 lb

## 2014-10-20 DIAGNOSIS — M25511 Pain in right shoulder: Secondary | ICD-10-CM | POA: Diagnosis not present

## 2014-10-20 NOTE — Progress Notes (Signed)
PCP: Pcp Not In System  Subjective:   HPI: Patient is a 34 y.o. male here for right shoulder pain.  2/15: Patient reports he was playing basketball about 3 weeks ago when he started to get throbbing and aching in his right shoulder. Did not fall on this or suffer an obvious injury. Is right handed. No prior issues with this shoulder. Works with [employer] with [employer]. Difficulty pulling cover up when lying in bed. + night pain  3/28: Patient reports he doesn't feel any different. Is doing PT and home exercises. + night pain. No new injuries or trauma.  4/14: Patient reports he feels over 70% improved. Injection helped. Doing PT and home exercises. Better motion. Would like to return to full duty now. No night pain.  Past Medical History  Diagnosis Date  . Cough     a. PFTS 09/2012: restriction probable, further examinations recommended. Saw pulm 11/2012: placed on GERD rx and short course prednisone, planned to regroup.  . LV dysfunction     a. Identified 10/2012 by echo EF 20-25%. b. Echo 01/2013: EF 20-25%, TEE EF 15% with severe RV dysfunction as well.  [name] History of palpitations     2x/month for several years feels heart racing  . CVA (cerebral vascular accident) 01/23/2013    tPA administered; MRI brain showed small acute R MCA infarct  . PFO (patent foramen ovale)     a. By TEE 01/2013.  [name] CHF (congestive heart failure)     Current Outpatient Prescriptions on File Prior to Visit  Medication Sig Dispense Refill  . atorvastatin (LIPITOR) 10 MG tablet Take 1 tablet (10 mg total) by mouth daily at 6 PM. 31 tablet 11  . carvedilol (COREG) 25 MG tablet Take 1 tablet (25 mg total) by mouth 2 (two) times daily. 62 tablet 11  . HYDROcodone-acetaminophen (NORCO/VICODIN) 5-325 MG per tablet Take 1 tablet by mouth every 6 (six) hours as needed for moderate pain or severe pain. 40 tablet 0  . losartan (COZAAR) 50 MG tablet Take 1 tablet (50 mg total) by mouth daily. 31  tablet 11  . rivaroxaban (XARELTO) 20 MG TABS tablet Take 1 tablet (20 mg total) by mouth daily. 31 tablet 11  . spironolactone (ALDACTONE) 25 MG tablet Take 1 tablet (25 mg total) by mouth daily. 31 tablet 11   No current facility-administered medications on file prior to visit.    Past Surgical History  Procedure Laterality Date  . No past surgeries    . Tee without cardioversion N/A 01/26/2013    Procedure: TRANSESOPHAGEAL ECHOCARDIOGRAM (TEE);  Surgeon: [name], MD;  [facility] END[facility];  Service: Cardiovascular;  Laterality: N/A;    No Known Allergies  History   Social History  . Marital Status: Single    Spouse Name: N/A  . Number of Children: N/A  . Years of Education: N/A   Occupational History  . Not on file.   Social History Main Topics  . Smoking status: Never Smoker   . Smokeless tobacco: Not on file  . Alcohol Use: No  . Drug Use: No  . Sexual Activity: Yes   Other Topics Concern  . Not on file   Social History Narrative   Marital status: single     Children: 3 children (3, 1, 22month old)      Lives: with girlfriend.      Employment: works for city of [employer]      Tobacco: none  Alcohol: none      Drugs: none      Exercise: basketball every Friday night.    Family History  Problem Relation Age of Onset  . Sarcoidosis Mother   . Heart Problems Mother     PPM implantation age 39    BP 105/72 mmHg  Pulse 78  Ht  (1.905 m)  Wt 252 lb (114.306 kg)  BMI 31.50 kg/m2  Review of Systems: See HPI above.    Objective:  Physical Exam:  Gen: NAD  Right shoulder: No swelling, ecchymoses.  No gross deformity. No TTP AC joint.  Mild tenderness biceps tendon. FROM without painful arc. Negative Hawkins, Neers. Negative Speeds, Yergasons. Strength 5/5 with empty can and resisted internal/external rotation. Negative apprehension. NV intact distally.    Assessment & Plan:  1. Right shoulder pain - no  obvious injury.  Has findings consistent with multiple issues - impingement, mild instability, subacromial bursitis.  Much improved following injection.  Will continue with a couple more visits of PT then do home exercises for 6 weeks.  Return to full duty.  F/u in 6 weeks or prn.

## 2014-10-20 NOTE — Patient Instructions (Signed)
Continue with the home exercises 3 times a week for the next 6 weeks. Return to full duty now. Follow up with me in 6 weeks or as needed.

## 2014-10-20 NOTE — Assessment & Plan Note (Signed)
no obvious injury.  Has findings consistent with multiple issues - impingement, mild instability, subacromial bursitis.  Much improved following injection.  Will continue with a couple more visits of PT then do home exercises for 6 weeks.  Return to full duty.  F/u in 6 weeks or prn.

## 2014-10-21 ENCOUNTER — Ambulatory Visit: Payer: 59 | Admitting: Rehabilitation

## 2014-10-27 ENCOUNTER — Other Ambulatory Visit: Payer: Self-pay

## 2014-10-27 NOTE — Telephone Encounter (Signed)
losartan (COZAAR) 50 MG tablet 31 tablet 11 07/15/2014

## 2014-11-01 NOTE — Progress Notes (Signed)
Cardiology Office Note   Date:  11/01/2014   ID:  Nathan Baird, DOB 04/20/1981, MRN [id number]  PCP:  Pcp Not In System  Cardiologist:   Kymora Sciara, Deloris Ping, MD   Chief Complaint  Patient presents with  . Follow-up    CHF   1. Congestive heart failure-ejection fraction of 20-25% by echo 2. Restrictive lung disease Baird. CVA 4. Right shoulder injury-  History of Present Illness:  Nathan Baird is a 34 year old gentleman who is a new consultation for further evaluation of his cough. He had an echocardiogram and was found to have ejection fraction of between 20 and 25%. I started him on Lasix 40 mg a day as well as potassium chloride 20 mg a day. He developed severe orthostatic hypotension and felt very poorly after taking one dose Lasix and he stopped.  Surprisingly, he does not have much shortness of breath. His basketball on a regular basis. He is able to play for hours at a time and does not have any significant shortness of breath. He does have a cough. He's tried a Z-Pak which cleared up the greenish color of his sputum but he is still having this cough.  He denies any PND orthopnea. He denies any syncope or presyncope. He denies any chest pain. He does not have any cardiac history.  Dec 02, 2012: Nathan Baird is feeling a bit better. His cough has improved. He still has episodes of profound fatigue. His breathing is better. He has greatly reduced his salt intake. He still plays basket ball on a regular basis.   September Baird, 2014: Nathan Baird, Nathan Baird has gained some weight since his last ov. He complains of a slight head ache / dizziness. He has occasional episodes of orthostasis. Otherwise, he is feeling well - breathing well.   Jan. 30, 2015: Nathan Baird is doing OK. He played basketball 2 weeks ago and felt great. He was able to play without dyspnea.  His most recent echocardiogram in November revealed improvement of his left ventricular systolic function. The official reading on the  echo reports an ejection fraction of 30%  Reviewed the echo and I think that his ejection fraction is actually a little bit better- at least 35%. In one view, the Simpson calculation reported 42%.  April Baird, 2015: Nathan Baird seems to be doing fairly well. We increased his losartan to 100 mg a day during his last visit but he started having episodes of lightheadedness any developed a headache. The symptoms resolved when he decreased his dose back down to the 50 mg where we were previously.  Nov 09, 2013: Nathan Baird is doing ok.   December 10, 2013: Nathan Baird is doing well  Sept. 8, 2015: Nathan Baird is doing well. No dyspnea. His last echocardiogram revealed his ejection fraction had decreased back down to 25%. He thinks that his EF has gone back up because he is not having shortness breath and his cough is better.  We have referred him to [name] for consideration for an ICD. He does not have a left bundle branch block so I do not think that he needs a CRT.   Jan. 8, 2016: Nathan Baird had another TIA recently. Was in the hospital. CT showed no new CVA He had stopped his Xarelto.[name] He has run out of his Xarelto again. Also ran out of the losartan or another med. He is feeling much better.   Feb. 18, 2016:  Nathan Baird is a 34 y.o. male who presents for follow  up of his chronic systolic CHf.   He has a right injury - needs to take NSAID.  His sports medicine doctor would not give him anything yet.    November 02, 2014: Nathan Baird is seen back today for follow up visit.  Recent echo shows persistent LV dysfunction - EF 25-30%. Has been on a diet.  Has lost 15 lbs.   Has occasional episodes of orthostasis - I do not think that we can increase his meds any further .    Past Medical History  Diagnosis Date  . Cough     a. PFTS Baird/2014: restriction probable, further examinations recommended. Saw pulm 11/2012: placed on GERD rx and short course prednisone, planned to regroup.  . LV dysfunction     a. Identified 10/2012  by echo EF 20-25%. b. Echo 01/2013: EF 20-25%, TEE EF 15% with severe RV dysfunction as well.  [name] History of palpitations     2x/month for several years feels heart racing  . CVA (cerebral vascular accident) 01/23/2013    tPA administered; MRI brain showed small acute R MCA infarct  . PFO (patent foramen ovale)     a. By TEE 01/2013.  [name] CHF (congestive heart failure)     Past Surgical History  Procedure Laterality Date  . No past surgeries    . Tee without cardioversion N/A 01/26/2013    Procedure: TRANSESOPHAGEAL ECHOCARDIOGRAM (TEE);  Surgeon: [name], MD;  Location: [facility] ENDOSCOPY;  Service: Cardiovascular;  Laterality: N/A;     Current Outpatient Prescriptions  Medication Sig Dispense Refill  . atorvastatin (LIPITOR) 10 MG tablet Take 1 tablet (10 mg total) by mouth daily at 6 PM. 31 tablet 11  . carvedilol (COREG) 25 MG tablet Take 1 tablet (25 mg total) by mouth 2 (two) times daily. 62 tablet 11  . HYDROcodone-acetaminophen (NORCO/VICODIN) 5-325 MG per tablet Take 1 tablet by mouth every 6 (six) hours as needed for moderate pain or severe pain. 40 tablet 0  . losartan (COZAAR) 50 MG tablet Take 1 tablet (50 mg total) by mouth daily. 31 tablet 11  . rivaroxaban (XARELTO) 20 MG TABS tablet Take 1 tablet (20 mg total) by mouth daily. 31 tablet 11  . spironolactone (ALDACTONE) 25 MG tablet Take 1 tablet (25 mg total) by mouth daily. 31 tablet 11   No current facility-administered medications for this visit.    Allergies:   Review of patient's allergies indicates no known allergies.    Social History:  The patient  reports that he has never smoked. He does not have any smokeless tobacco history on file. He reports that he does not drink alcohol or use illicit drugs.   Family History:  The patient's family history includes Heart Problems in his mother; Sarcoidosis in his mother.    ROS:  Please see the history of present illness.    Review of Systems: Constitutional:   denies fever, chills, diaphoresis, appetite change and fatigue.  HEENT: denies photophobia, eye pain, redness, hearing loss, ear pain, congestion, sore throat, rhinorrhea, sneezing, neck pain, neck stiffness and tinnitus.  Respiratory: denies SOB, DOE, cough, chest tightness, and wheezing.  Cardiovascular: denies chest pain, palpitations and leg swelling.  Gastrointestinal: denies nausea, vomiting, abdominal pain, diarrhea, constipation, blood in stool.  Genitourinary: denies dysuria, urgency, frequency, hematuria, flank pain and difficulty urinating.  Musculoskeletal: denies  myalgias, back pain, joint swelling, arthralgias and gait problem.   Skin: denies pallor, rash and wound.  Neurological: denies dizziness, seizures, syncope, weakness,  light-headedness, numbness and headaches.   Hematological: denies adenopathy, easy bruising, personal or family bleeding history.  Psychiatric/ Behavioral: denies suicidal ideation, mood changes, confusion, nervousness, sleep disturbance and agitation.       All other systems are reviewed and negative.    PHYSICAL EXAM: VS:  There were no vitals taken for this visit. , BMI There is no weight on file to calculate BMI. GEN: Well nourished, well developed, in no acute distress HEENT: normal Neck: no JVD, carotid bruits, or masses Cardiac: RRR; no murmurs, rubs, or gallops,no edema  Respiratory:  clear to auscultation bilaterally, normal work of breathing GI: soft, nontender, nondistended, + BS MS: no deformity or atrophy Skin: warm and dry, no rash Neuro:  Strength and sensation are intact Psych: normal   EKG:  EKG is not ordered today.    Recent Labs: 05/19/2014: Pro B Natriuretic peptide (BNP) 10.8 05/20/2014: ALT 22; Hemoglobin 16.2; Platelets 273 08/04/2014: BUN 14; Creatinine 1.12; Potassium Baird.8; Sodium 140    Lipid Panel    Component Value Date/Time   CHOL 162 05/20/2014 0026   TRIG 141 05/20/2014 0026   HDL 45 05/20/2014 0026    CHOLHDL Baird.6 05/20/2014 0026   VLDL 28 05/20/2014 0026   LDLCALC 89 05/20/2014 0026      Wt Readings from Last Baird Encounters:  10/20/14 252 lb (114.306 kg)  10/03/14 250 lb (113.399 kg)  08/25/14 264 lb (119.75 kg)      Other studies Reviewed: Additional studies/ records that were reviewed today include: . Review of the above records demonstrates:    ASSESSMENT AND PLAN:  1. Chronic systolic Congestive heart failure-his ejection fraction has improved slightly but is still 25-30%. He's basically asymptomatic. He plays basketball on a regular basis without chest pain or shortness of breath.  We need to refer him to repeat for consideration for ICD. I've had a long discussion with him and he now agrees to be seen for consideration for an ICD.  He's basically well-controlled. His blood pressure is borderline today. He has occasional episodes of orthostatic hypotension-I do not think that we can safely increase any of his medications or add any medications at this time.  We'll check a basic medical profile this morning. I'll see him in Baird months. We'll check a basic metabolic profile at that time.  2. Restrictive lung disease Baird. CVA - he has an EF of 25-30% .  Has had a CVA  4. Right shoulder injury.  Current medicines are reviewed at length with the patient today.  The patient does not have concerns regarding medicines.  The following changes have been made:  no change   Disposition:   FU with me in Baird months     Signed, Elaysha Bevard, Deloris Ping, MD  11/01/2014 9:29 PM    [facility] [address] [place], [place], [place]  [address] Phone: [phone]; Fax: [phone]

## 2014-11-02 ENCOUNTER — Encounter: Payer: Self-pay | Admitting: Cardiovascular Disease

## 2014-11-02 ENCOUNTER — Ambulatory Visit (INDEPENDENT_AMBULATORY_CARE_PROVIDER_SITE_OTHER): Payer: 59 | Admitting: Cardiovascular Disease

## 2014-11-02 VITALS — BP 100/84 | HR 87 | Ht 75.0 in | Wt 250.4 lb

## 2014-11-02 DIAGNOSIS — Z8673 Personal history of transient ischemic attack (TIA), and cerebral infarction without residual deficits: Secondary | ICD-10-CM | POA: Diagnosis not present

## 2014-11-02 DIAGNOSIS — I5022 Chronic systolic (congestive) heart failure: Secondary | ICD-10-CM

## 2014-11-02 NOTE — Patient Instructions (Addendum)
Medication Instructions:  None  Labwork: Today (BMET) and in 3 months at your next office visit.  Testing/Procedures: None  Follow-Up: Your physician recommends that you schedule a follow-up appointment in: 3 months Dr Elease Hashimoto is referring you to one of our EP cardiologist for consideration of ICD   Any Other Special Instructions Will Be Listed Below (If Applicable).

## 2014-11-07 ENCOUNTER — Telehealth: Payer: Self-pay | Admitting: Nurse Practitioner

## 2014-11-07 NOTE — Telephone Encounter (Signed)
Patient left after appointment on 4/26 without getting basic metabolic panel ordered by Dr. Elease Hashimoto.  He told Mariane Masters, [facility] that he would return the next day for lab work and he did not.  I left message for patient to call the office to discuss and included that lab is open from 7:30 am to 5:00 pm and to come anytime between those hours for blood work.

## 2014-11-09 ENCOUNTER — Other Ambulatory Visit (INDEPENDENT_AMBULATORY_CARE_PROVIDER_SITE_OTHER): Payer: 59 | Admitting: *Deleted

## 2014-11-09 DIAGNOSIS — I5022 Chronic systolic (congestive) heart failure: Secondary | ICD-10-CM

## 2014-11-09 LAB — BASIC METABOLIC PANEL
BUN: 17 mg/dL (ref 6–23)
CHLORIDE: 102 meq/L (ref 96–112)
CO2: 30 mEq/L (ref 19–32)
CREATININE: 1.2 mg/dL (ref 0.40–1.50)
Calcium: 10.1 mg/dL (ref 8.4–10.5)
GFR: 89.6 mL/min (ref >60.00)
Glucose, Bld: 86 mg/dL (ref 70–99)
Potassium: 4.1 mEq/L (ref 3.5–5.1)
Sodium: 136 mEq/L (ref 135–145)

## 2014-11-09 NOTE — Telephone Encounter (Signed)
Patient here for lab work appointment; work excuse given by Thane Edu, RN

## 2014-11-09 NOTE — Addendum Note (Signed)
Addended by: Tonita Phoenix on: 11/09/2014 10:40 AM   Modules accepted: Orders

## 2014-11-21 ENCOUNTER — Ambulatory Visit (INDEPENDENT_AMBULATORY_CARE_PROVIDER_SITE_OTHER): Payer: 59 | Admitting: Internal Medicine

## 2014-11-21 ENCOUNTER — Encounter: Payer: Self-pay | Admitting: Internal Medicine

## 2014-11-21 VITALS — BP 104/70 | HR 63 | Ht 75.0 in | Wt 250.8 lb

## 2014-11-21 DIAGNOSIS — I639 Cerebral infarction, unspecified: Secondary | ICD-10-CM

## 2014-11-21 DIAGNOSIS — I5022 Chronic systolic (congestive) heart failure: Secondary | ICD-10-CM

## 2014-11-21 DIAGNOSIS — I10 Essential (primary) hypertension: Secondary | ICD-10-CM

## 2014-11-21 DIAGNOSIS — I6340 Cerebral infarction due to embolism of unspecified cerebral artery: Secondary | ICD-10-CM | POA: Diagnosis not present

## 2014-11-21 DIAGNOSIS — Z8673 Personal history of transient ischemic attack (TIA), and cerebral infarction without residual deficits: Secondary | ICD-10-CM

## 2014-11-21 NOTE — Progress Notes (Signed)
Electrophysiology Office Note   Date:  11/21/2014   ID:  Nathan Baird, DOB [birth year], MRN [id number]  PCP:  Nathan Baird Cardiologist:  Dr Elease Hashimoto Primary Electrophysiologist: Nathan Baird    Chief Complaint  Patient presents with  . Chronic systolic CHF     History of Present Illness: Nathan Baird is a 34 y.o. male who presents today for electrophysiology evaluation.   He reports initially being diagnosed with CHF in 2014 after presenting with cough and SOB.  In retrospect, he had been having symptoms of CHF for about a year before.  He developed orthopnea.  He was evaluated and found to have EF 20-25%.  He had a stroke 4/14 for which tPA awas dministered; MRI brain showed small acute R MCA.  He had a PFO discovered on TEE.  He was placed on xarelto though I do not see that he carries a diagnosis of afib.  He denies h/o afib.  I also do not see a documented DVT. He has been treated with an optimal medical regimen but continues to have a reduced EF.  He reports NYHA Class II symptoms.  He plays basketball.  Further medical therapy for CHF has been limited by hypotension/ orthostasis.  He reports compliance with his medicines.    Today, he denies symptoms of palpitations, chest pain, orthopnea, PND, lower extremity edema, claudication, dizziness, presyncope, syncope, bleeding, or neurologic sequela. The patient is tolerating medications without difficulties and is otherwise without complaint today.    Past Medical History  Diagnosis Date  . Cough     a. PFTS 09/2012: restriction probable, further examinations recommended. Saw pulm 11/2012: placed on GERD rx and short course prednisone, planned to regroup.  [name] Nonischemic cardiomyopathy     a. Identified 10/2012 by echo EF 20-25%. b. Echo 01/2013: EF 20-25%, TEE EF 15% with severe RV dysfunction as well.  [name] History of palpitations     2x/month for several years feels heart racing  . CVA (cerebral vascular accident) 01/23/2013      tPA administered; MRI brain showed small acute R MCA infarct  . PFO (patent foramen ovale)     a. By TEE 01/2013.  [name] CHF (congestive heart failure)    Past Surgical History  Procedure Laterality Date  . No past surgeries    . Tee without cardioversion N/A 01/26/2013    Procedure: TRANSESOPHAGEAL ECHOCARDIOGRAM (TEE);  Surgeon: Nathan Baird;  [facility] ENDOSCOPY;  Service: Cardiovascular;  Laterality: N/A;     Current Outpatient Prescriptions  Medication Sig Dispense Refill  . atorvastatin (LIPITOR) 10 MG tablet Take 1 tablet (10 mg total) by mouth daily at 6 PM. 31 tablet 11  . carvedilol (COREG) 25 MG tablet Take 1 tablet (25 mg total) by mouth 2 (two) times daily. 62 tablet 11  . HYDROcodone-acetaminophen (NORCO/VICODIN) 5-325 MG per tablet Take 1 tablet by mouth every 6 (six) hours as needed for moderate pain or severe pain. 40 tablet 0  . losartan (COZAAR) 50 MG tablet Take 1 tablet (50 mg total) by mouth daily. 31 tablet 11  . rivaroxaban (XARELTO) 20 MG TABS tablet Take 1 tablet (20 mg total) by mouth daily. 31 tablet 11  . spironolactone (ALDACTONE) 25 MG tablet Take 1 tablet (25 mg total) by mouth daily. 31 tablet 11   No current facility-administered medications for this visit.    Allergies:   Review of patient's allergies indicates no known allergies.   Social History:  The patient  reports that he has never smoked. He does not have any smokeless tobacco history on file. He reports that he does not drink alcohol or use illicit drugs.   Family History:  The patient's  family history includes Heart Problems in his mother; Sarcoidosis in his mother.    ROS:  Please see the history of present illness.   All other systems are reviewed and negative.    PHYSICAL EXAM: VS:  BP 104/70 mmHg  Pulse 63  Ht  (1.905 m)  Wt 250 lb 12.8 oz (113.762 kg)  BMI 31.35 kg/m2 , BMI Body mass index is 31.35 kg/(m^2). GEN: Well nourished, well developed, in no acute  distress HEENT: normal Neck: no JVD, carotid bruits, or masses Cardiac: RRR; no murmurs, rubs, or gallops,no edema  Respiratory:  clear to auscultation bilaterally, normal work of breathing GI: soft, nontender, nondistended, + BS MS: no deformity or atrophy Skin: warm and dry  Neuro:  Strength and sensation are intact Psych: euthymic mood, full affect  EKG:  EKG is ordered today. The ekg ordered today shows sinus rhythm 63 bpm, PR 178, QRS 98, QTc 419, inferior and lateral TWI   Recent Labs: 05/19/2014: Pro B Natriuretic peptide (BNP) 10.8 05/20/2014: ALT 22; Hemoglobin 16.2; Platelets 273 11/09/2014: BUN 17; Creatinine 1.20; Potassium 4.1; Sodium 136    Lipid Panel     Component Value Date/Time   CHOL 162 05/20/2014 0026   TRIG 141 05/20/2014 0026   HDL 45 05/20/2014 0026   CHOLHDL 3.6 05/20/2014 0026   VLDL 28 05/20/2014 0026   LDLCALC 89 05/20/2014 0026     Wt Readings from Last 3 Encounters:  11/21/14 250 lb 12.8 oz (113.762 kg)  11/02/14 250 lb 6.4 oz (113.581 kg)  10/20/14 252 lb (114.306 kg)      Other studies Reviewed: Additional studies/ records that were reviewed today include: echo, cardiac MRI, hospital records, Dr Nathan Baird noters  Review of the above records today demonstrates: MRI suggests noncompaction   ASSESSMENT AND PLAN:  1.  Nonischemic CM/ chronic systolic dysfunction/ LV noncompaction The patient has a nonischemic CM (EF 25%) and NYHA Class II CHF. At this time, he meets SCD-HeFT criteria for ICD implantation for primary prevention of sudden death. QRS is narrow and therefore he does not meet criteria for CRT. Risks, benefits, alternatives to ICD implantation were discussed in detail with the patient today. The patient  understands that the risks include but are not limited to bleeding, infection, pneumothorax, perforation, tamponade, vascular damage, renal failure, MI, stroke, death, inappropriate shocks, and lead dislodgement and wishes to further  contemplate this option.  He has a Manufacturing engineer (CDL) and is not sure that he wishes to proceed with an ICD.  Given his very young age, I would also like for him to consider S-ICD as an option.  We discussed this today and he is interested.  I will therefore refer to Dr Graciela Husbands for further S-ICD discussions.  If he chooses a standard ICD, I would advise an MRI compatable device given prior stroke.  Also think that MDTs new atrial fib detection algorithm in a single chamber ICD could be considered.  2. Prior stroke Not sure why he is on xarelto as I do not see afib detected Will defer to neurology and Dr Elease Hashimoto   Current medicines are reviewed at length with the patient today.   The patient does not have concerns regarding his medicines.  The following changes were  made today:  none   Follow-up with Dr Graciela Husbands as above I will see as needed going forward  Signed, Nathan Baird  11/21/2014 9:22 AM     [facility] [address] [place] [address] [phone] (office) [phone] (fax)

## 2014-11-21 NOTE — Patient Instructions (Addendum)
Medication Instructions:  Your physician recommends that you continue on your current medications as directed. Please refer to the Current Medication list given to you today.   Labwork: NONE  Testing/Procedures: NONE  Follow-Up: Your physician recommends that you schedule a follow-up appointment with [name]  - our office will call you to setup appt.  Follow up with Dr. Johney Frame as needed.  Any Other Special Instructions Will Be Listed Below (If Applicable).

## 2014-11-23 ENCOUNTER — Encounter: Payer: Self-pay | Admitting: Family Medicine

## 2014-11-23 ENCOUNTER — Ambulatory Visit (INDEPENDENT_AMBULATORY_CARE_PROVIDER_SITE_OTHER): Payer: 59 | Admitting: Family Medicine

## 2014-11-23 VITALS — BP 104/71 | HR 66 | Ht 75.0 in | Wt 247.0 lb

## 2014-11-23 DIAGNOSIS — S46812A Strain of other muscles, fascia and tendons at shoulder and upper arm level, left arm, initial encounter: Secondary | ICD-10-CM | POA: Diagnosis not present

## 2014-11-23 MED ORDER — DICLOFENAC SODIUM 75 MG PO TBEC: 75.0000 mg | DELAYED_RELEASE_TABLET | Freq: Two times a day (BID) | ORAL | Status: DC

## 2014-11-23 MED ORDER — METHOCARBAMOL 500 MG PO TABS: 500.0000 mg | ORAL_TABLET | Freq: Four times a day (QID) | ORAL | Status: DC | PRN

## 2014-11-23 NOTE — Patient Instructions (Signed)
You have rhomboid, trapezius strains Take diclofenac twice a day with food for 7-10 days then as needed for pain, inflammation. Robaxin as needed for spasms (don't take during the day if it makes you sleepy). Start physical therapy and do home exercises daily for next 6 weeks. Heat 15 minutes at a time 3-4 times a day.

## 2014-11-24 DIAGNOSIS — S46819A Strain of other muscles, fascia and tendons at shoulder and upper arm level, unspecified arm, initial encounter: Secondary | ICD-10-CM | POA: Insufficient documentation

## 2014-11-24 NOTE — Progress Notes (Signed)
PCP: Pcp Not In System  Subjective:   HPI: Patient is a 34 y.o. male here for upper back pain.  Patient denies known injury. States about 2 weeks ago he started to get pain in left upper back. No swelling, bruising. Associated with burning, numb sensation only locally. No radiation into arm. No pain with motions that he can reproduce. Is right handed.  Past Medical History  Diagnosis Date  . Cough     a. PFTS 09/2012: restriction probable, further examinations recommended. Saw pulm 11/2012: placed on GERD rx and short course prednisone, planned to regroup.  [name] Nonischemic cardiomyopathy     a. Identified 10/2012 by echo EF 20-25%. b. Echo 01/2013: EF 20-25%, TEE EF 15% with severe RV dysfunction as well.  [name] History of palpitations     2x/month for several years feels heart racing  . CVA (cerebral vascular accident) 01/23/2013    tPA administered; MRI brain showed small acute R MCA infarct  . PFO (patent foramen ovale)     a. By TEE 01/2013.  [name] CHF (congestive heart failure)     Current Outpatient Prescriptions on File Prior to Visit  Medication Sig Dispense Refill  . atorvastatin (LIPITOR) 10 MG tablet Take 1 tablet (10 mg total) by mouth daily at 6 PM. 31 tablet 11  . carvedilol (COREG) 25 MG tablet Take 1 tablet (25 mg total) by mouth 2 (two) times daily. 62 tablet 11  . HYDROcodone-acetaminophen (NORCO/VICODIN) 5-325 MG per tablet Take 1 tablet by mouth every 6 (six) hours as needed for moderate pain or severe pain. 40 tablet 0  . losartan (COZAAR) 50 MG tablet Take 1 tablet (50 mg total) by mouth daily. 31 tablet 11  . spironolactone (ALDACTONE) 25 MG tablet Take 1 tablet (25 mg total) by mouth daily. 31 tablet 11   No current facility-administered medications on file prior to visit.    Past Surgical History  Procedure Laterality Date  . No past surgeries    . Tee without cardioversion N/A 01/26/2013    Procedure: TRANSESOPHAGEAL ECHOCARDIOGRAM (TEE);  Surgeon: [name], MD;  [facility] END[facility];  Service: Cardiovascular;  Laterality: N/A;    No Known Allergies  History   Social History  . Marital Status: Single    Spouse Name: N/A  . Number of Children: N/A  . Years of Education: N/A   Occupational History  . Not on file.   Social History Main Topics  . Smoking status: Never Smoker   . Smokeless tobacco: Not on file  . Alcohol Use: No  . Drug Use: No  . Sexual Activity: Yes   Other Topics Concern  . Not on file   Social History Narrative   Marital status: single     Children: 4 children (45, 34, 2, 46month old)      Lives: with girlfriend in [place].      Employment: works for city of [employer]      Exercise: basketball every Friday night.    Family History  Problem Relation Age of Onset  . Sarcoidosis Mother   . Heart Problems Mother     PPM implantation age 77    BP 104/71 mmHg  Pulse 66  Ht 6\' 3"  (1.905 m)  Wt 247 lb (112.038 kg)  BMI 30.87 kg/m2  Review of Systems: See HPI above.    Objective:  Physical Exam:  Gen: NAD  Back: No gross deformity, scoliosis. Minimal TTP medial to left scapula within  rhomboids, trapezius in this region.  No midline or bony TTP. FROM shoulders, neck without reproduction of pain. Strength UEs 5/5 all muscle groups.   Sensation intact to light touch upper extremities.    Assessment & Plan:  1. Trapezius, rhomboid strains - diclofenac with robaxin as needed.  Start physical therapy and home exercises.  Heat for spasms.  F/u in 1 month.

## 2014-11-24 NOTE — Assessment & Plan Note (Signed)
Trapezius, rhomboid strains - diclofenac with robaxin as needed.  Start physical therapy and home exercises.  Heat for spasms.  F/u in 1 month.

## 2014-11-25 ENCOUNTER — Telehealth: Payer: Self-pay | Admitting: *Deleted

## 2014-11-25 NOTE — Telephone Encounter (Signed)
lmom for patient to call me back in regards to his ICD implant.  His insurance will not cover SQICD so he will need a transvenous ICD.  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

## 2014-11-30 ENCOUNTER — Encounter: Payer: Self-pay | Admitting: Physical Therapy

## 2014-11-30 ENCOUNTER — Ambulatory Visit: Payer: 59 | Attending: Family Medicine | Admitting: Physical Therapy

## 2014-11-30 DIAGNOSIS — M25511 Pain in right shoulder: Secondary | ICD-10-CM | POA: Insufficient documentation

## 2014-11-30 DIAGNOSIS — R29898 Other symptoms and signs involving the musculoskeletal system: Secondary | ICD-10-CM | POA: Diagnosis not present

## 2014-11-30 DIAGNOSIS — S46812A Strain of other muscles, fascia and tendons at shoulder and upper arm level, left arm, initial encounter: Secondary | ICD-10-CM

## 2014-11-30 NOTE — Therapy (Addendum)
[facility] [address]  [place], [place], [address] Phone: [phone]   Fax:  [phone]  Physical Therapy Evaluation  Patient Details  Name: Nathan Baird MRN: [id number] Date of Birth: 1980-08-22 Referring Provider:  Dene Gentry, MD  Encounter Date: 11/30/2014      PT End of Session - 11/30/14 1032    Visit Number 1   Number of Visits 8   Date for PT Re-Evaluation 12/28/14   PT Start Time 1018   PT Stop Time 1108   PT Time Calculation (min) 50 min      Past Medical History  Diagnosis Date  . Cough     a. PFTS 09/2012: restriction probable, further examinations recommended. Saw pulm 11/2012: placed on GERD rx and short course prednisone, planned to regroup.  [name] Nonischemic cardiomyopathy     a. Identified 10/2012 by echo EF 20-25%. b. Echo 01/2013: EF 20-25%, TEE EF 15% with severe RV dysfunction as well.  [name] History of palpitations     2x/month for several years feels heart racing  . CVA (cerebral vascular accident) 01/23/2013    tPA administered; MRI brain showed small acute R MCA infarct  . PFO (patent foramen ovale)     a. By TEE 01/2013.  [name] CHF (congestive heart failure)     Past Surgical History  Procedure Laterality Date  . No past surgeries    . Tee without cardioversion N/A 01/26/2013    Procedure: TRANSESOPHAGEAL ECHOCARDIOGRAM (TEE);  Surgeon: Lelon Perla, MD;  Location: [facility] ENDOSCOPY;  Service: Cardiovascular;  Laterality: N/A;    There were no vitals filed for this visit.  Visit Diagnosis:  Trapezius muscle strain, left, initial encounter      Subjective Assessment - 11/30/14 1024    Subjective pt recently discharged from this office for treatment to R shoulder and returns today for OPPT initial eval due to L upper scapular pain.  States L scapular pain began approx 1 month ago.  States notes soreness, burning pain, and numb sensation to this area.  States does not note pain with  activity but is noted after activity.  Is sleeping without interruption by pain.   Pertinent History CVA (April 2015) TIA (2016), CHF (2014)   Patient Stated Goals decrease pain   Currently in Pain? 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  No   Is the patient reluctant to leave their home because of a fear of falling?  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# (50# is rare per pt)   Leisure plays basketball 2-3 days/wk   Observation/Other Assessments   Focus on Therapeutic Outcomes (FOTO)  21% limitation   ROM / Strength   AROM / PROM / Strength AROM  AROM   AROM Assessment Site Cervical   Cervical - Right Side Bend 30   Cervical - Left Side Bend 32   Cervical - Right Rotation 72   Cervical - Left Rotation 72   Palpation   Palpation comment TTP L scapular spine and inferior and superior to this bone as well   Hawkins-Kennedy test   Findings Negative   Side Left   Empty Can test   Findings Negative   Side Left   Full Can test   Findings Negative   Side Left   Special Tests   Rotator Cuff Impingment tests Empty Can test;Full Can test;Hawkins- Kennedy test        TODAY'S TREATMENT Premod E-stim (10-20Hz ) with CP to L mid and upper scapular muscles x 16' 2 strips kinesiotape 50% stretch to upper and mid trap on L            PT Long Term Goals - 11/30/14 1056    PT LONG TERM GOAL #1   Title  pt independent with HEP as necessary for continued improvement by 12/28/14   Status New   PT LONG TERM GOAL #2   Title pt states L scapular pain is infrequent (less than 20% of time) and intensity 2/10 or less on average by 12/28/14   Status New               Plan - 11/30/14 1052    Clinical Impression Statement pt with s/s of L mid traezius strain/trigger point.  C-spine and L Shoulder ROM is WNL without c/o symptom provocation.  RC and c-spine quadrant special testing also normal.  Will address pain chiefly with manual soft tissue and scapular mobes along with modalities (e-stim, taping, needling) and add exercises as necessary focusing on improved posture/body mechanics.   Pt will benefit from skilled therapeutic intervention in order to improve on the following deficits Pain   Rehab Potential Good   PT Frequency 2x / week   PT Duration 4 weeks   PT Treatment/Interventions Manual techniques;Dry needling;Electrical Stimulation;Cryotherapy;Therapeutic exercise;Therapeutic activities;Moist Heat;Ultrasound;Taping   PT Next Visit Plan scapular mobes, scapular retraction exercises, needling and/or taping PRN   Consulted and Agree with Plan of Care Patient         Problem List Patient Active Problem List   Diagnosis Date Noted  . Trapezius muscle strain 11/24/2014  . Right shoulder pain 08/24/2014  . TIA (transient ischemic attack) 05/20/2014  . History of CVA (cerebrovascular accident)   . Essential hypertension   . HLD (hyperlipidemia)   . Encounter for long-term (current) use of high-risk medication 01/27/2013  . Patent foramen ovale with right to left shunt 01/27/2013  . Stroke, acute, embolic 87/56/4332  . Cough 10/20/2012  . Chronic systolic congestive heart failure 10/20/2012  . Penile pain 05/19/2012  . ATTENTION DEFICIT, W/HYPERACTIVITY 09/04/2006    Kenzie Thoreson PT, OCS 11/30/2014, 11:17 AM  [facility] [place], [place], [address] Phone: [phone]   Fax:  [phone]    PHYSICAL THERAPY DISCHARGE SUMMARY  Visits from Start of Care: 1  Current functional level related to goals / functional outcomes: See above; pt did not show for any remaining appts   Remaining deficits: unknown   Education / Equipment: n/a  Plan: Patient agrees to discharge.  Patient goals were not met. Patient is being discharged due to not returning since the last visit.  ?????    Augusto Garbe  Zigmund Daniel, PT, DPT 01/19/2015 10:11 AM  [facility] [place], [place] [address]  [phone] (office) [phone] (fax)

## 2014-12-02 NOTE — Telephone Encounter (Signed)
Left another message for patient to return my call in regards to having ICD implanted.

## 2014-12-09 ENCOUNTER — Ambulatory Visit: Payer: 59 | Attending: Family Medicine | Admitting: Physical Therapy

## 2014-12-16 ENCOUNTER — Ambulatory Visit: Payer: 59 | Admitting: Physical Therapy

## 2014-12-23 ENCOUNTER — Ambulatory Visit: Payer: 59 | Admitting: Rehabilitation

## 2014-12-25 ENCOUNTER — Emergency Department (HOSPITAL_BASED_OUTPATIENT_CLINIC_OR_DEPARTMENT_OTHER)
Admission: EM | Admit: 2014-12-25 | Discharge: 2014-12-25 | Disposition: A | Discharge status: Home / Self Care | Payer: 59 | Attending: Emergency Medicine | Admitting: Emergency Medicine

## 2014-12-25 ENCOUNTER — Encounter (HOSPITAL_BASED_OUTPATIENT_CLINIC_OR_DEPARTMENT_OTHER): Payer: Self-pay | Admitting: *Deleted

## 2014-12-25 DIAGNOSIS — Z8679 Personal history of other diseases of the circulatory system: Secondary | ICD-10-CM | POA: Insufficient documentation

## 2014-12-25 DIAGNOSIS — N4889 Other specified disorders of penis: Secondary | ICD-10-CM | POA: Diagnosis present

## 2014-12-25 DIAGNOSIS — Z791 Long term (current) use of non-steroidal anti-inflammatories (NSAID): Secondary | ICD-10-CM | POA: Diagnosis not present

## 2014-12-25 DIAGNOSIS — Z79899 Other long term (current) drug therapy: Secondary | ICD-10-CM | POA: Insufficient documentation

## 2014-12-25 DIAGNOSIS — R300 Dysuria: Secondary | ICD-10-CM | POA: Diagnosis not present

## 2014-12-25 DIAGNOSIS — Z8673 Personal history of transient ischemic attack (TIA), and cerebral infarction without residual deficits: Secondary | ICD-10-CM | POA: Insufficient documentation

## 2014-12-25 DIAGNOSIS — Q211 Atrial septal defect: Secondary | ICD-10-CM | POA: Diagnosis not present

## 2014-12-25 DIAGNOSIS — I509 Heart failure, unspecified: Secondary | ICD-10-CM | POA: Diagnosis not present

## 2014-12-25 LAB — URINALYSIS, ROUTINE W REFLEX MICROSCOPIC
Bilirubin Urine: NEGATIVE
GLUCOSE, UA: NEGATIVE mg/dL
HGB URINE DIPSTICK: NEGATIVE
Ketones, ur: NEGATIVE mg/dL
Leukocytes, UA: NEGATIVE
Nitrite: NEGATIVE
Protein, ur: NEGATIVE mg/dL
Specific Gravity, Urine: 1.025 (ref 1.005–1.030)
Urobilinogen, UA: 1 mg/dL (ref 0.0–1.0)
pH: 6.5 (ref 5.0–8.0)

## 2014-12-25 MED ORDER — AZITHROMYCIN 250 MG PO TABS
1000.0000 mg | ORAL_TABLET | Freq: Once | ORAL | Status: AC
  Administered 2014-12-25: 1000 mg via ORAL
  Filled 2014-12-25: qty 4

## 2014-12-25 MED ORDER — CEFTRIAXONE SODIUM 250 MG IJ SOLR
250.0000 mg | Freq: Once | INTRAMUSCULAR | Status: AC
  Administered 2014-12-25: 250 mg via INTRAMUSCULAR
  Filled 2014-12-25: qty 250

## 2014-12-25 MED ORDER — LIDOCAINE HCL (PF) 1 % IJ SOLN
2.0000 mL | Freq: Once | INTRAMUSCULAR | Status: AC
  Administered 2014-12-25: 2 mL
  Filled 2014-12-25: qty 5

## 2014-12-25 NOTE — ED Notes (Signed)
C/o penile pain that started 3 days ago. Denies fevers. C/o lower mid abd cramping type pain. Denies any penile d/c. Denies any burning with urination or urinary frequency. Possible exposure to an STD.

## 2014-12-25 NOTE — Discharge Instructions (Signed)
We will call you if your cultures indicate you require further treatment.  Return to the emergency department if your symptoms significantly worsen or change.   Dysuria Dysuria is the medical term for pain with urination. There are many causes for dysuria, but urinary tract infection is the most common. If a urinalysis was performed it can show that there is a urinary tract infection. A urine culture confirms that you or your child is sick. You will need to follow up with a healthcare provider because:  If a urine culture was done you will need to know the culture results and treatment recommendations.  If the urine culture was positive, you or your child will need to be put on antibiotics or know if the antibiotics prescribed are the right antibiotics for your urinary tract infection.  If the urine culture is negative (no urinary tract infection), then other causes may need to be explored or antibiotics need to be stopped. Today laboratory work may have been done and there does not seem to be an infection. If cultures were done they will take at least 24 to 48 hours to be completed. Today x-rays may have been taken and they read as normal. No cause can be found for the problems. The x-rays may be re-read by a radiologist and you will be contacted if additional findings are made. You or your child may have been put on medications to help with this problem until you can see your primary caregiver. If the problems get better, see your primary caregiver if the problems return. If you were given antibiotics (medications which kill germs), take all of the mediations as directed for the full course of treatment.  If laboratory work was done, you need to find the results. Leave a telephone number where you can be reached. If this is not possible, make sure you find out how you are to get test results. HOME CARE INSTRUCTIONS   Drink lots of fluids. For adults, drink eight, 8 ounce glasses of clear juice  or water a day. For children, replace fluids as suggested by your caregiver.  Empty the bladder often. Avoid holding urine for long periods of time.  After a bowel movement, women should cleanse front to back, using each tissue only once.  Empty your bladder before and after sexual intercourse.  Take all the medicine given to you until it is gone. You may feel better in a few days, but TAKE ALL MEDICINE.  Avoid caffeine, tea, alcohol and carbonated beverages, because they tend to irritate the bladder.  In men, alcohol may irritate the prostate.  Only take over-the-counter or prescription medicines for pain, discomfort, or fever as directed by your caregiver.  If your caregiver has given you a follow-up appointment, it is very important to keep that appointment. Not keeping the appointment could result in a chronic or permanent injury, pain, and disability. If there is any problem keeping the appointment, you must call back to this facility for assistance. SEEK IMMEDIATE MEDICAL CARE IF:   Back pain develops.  A fever develops.  There is nausea (feeling sick to your stomach) or vomiting (throwing up).  Problems are no better with medications or are getting worse. MAKE SURE YOU:   Understand these instructions.  Will watch your condition.  Will get help right away if you are not doing well or get worse. Document Released: 03/22/2004 Document Revised: 09/16/2011 Document Reviewed: 01/28/2008 [facility] Patient Information 2015 [place], [place]. This information is not intended to  replace advice given to you by your health care provider. Make sure you discuss any questions you have with your health care provider. ° °

## 2014-12-25 NOTE — ED Notes (Addendum)
Patient states he feels that he "caught something". Patient states he has more penis pain than testicle pain.

## 2014-12-25 NOTE — ED Provider Notes (Signed)
CSN: [id number]     Arrival date & time 12/25/14  0155 History   First MD Initiated Contact with Patient 12/25/14 0230     No chief complaint on file.    (Consider location/radiation/quality/duration/timing/severity/associated sxs/prior Treatment) HPI Comments: Patient is a 34 year old male with history of CHF. He presents for evaluation of suprapubic discomfort, dysuria, and discomfort in his penis that has worsened over the past several days. He reports a new sexual contact and is concerned he may have "caught something". He denies any urethral discharge. He denies any fevers.  The history is provided by the patient.    Past Medical History  Diagnosis Date  . Cough     a. PFTS 09/2012: restriction probable, further examinations recommended. Saw pulm 11/2012: placed on GERD rx and short course prednisone, planned to regroup.  [name] Nonischemic cardiomyopathy     a. Identified 10/2012 by echo EF 20-25%. b. Echo 01/2013: EF 20-25%, TEE EF 15% with severe RV dysfunction as well.  [name] History of palpitations     2x/month for several years feels heart racing  . CVA (cerebral vascular accident) 01/23/2013    tPA administered; MRI brain showed small acute R MCA infarct  . PFO (patent foramen ovale)     a. By TEE 01/2013.  [name] CHF (congestive heart failure)    Past Surgical History  Procedure Laterality Date  . No past surgeries    . Tee without cardioversion N/A 01/26/2013    Procedure: TRANSESOPHAGEAL ECHOCARDIOGRAM (TEE);  Surgeon: [name], MD;  Location: [facility] ENDOSCOPY;  Service: Cardiovascular;  Laterality: N/A;   Family History  Problem Relation Age of Onset  . Sarcoidosis Mother   . Heart Problems Mother     PPM implantation age 53   History  Substance Use Topics  . Smoking status: Never Smoker   . Smokeless tobacco: Not on file  . Alcohol Use: No    Review of Systems  All other systems reviewed and are negative.     Allergies  Review of patient's allergies indicates no  known allergies.  Home Medications   Prior to Admission medications   Medication Sig Start Date End Date Taking? Authorizing Provider  atorvastatin (LIPITOR) 10 MG tablet Take 1 tablet (10 mg total) by mouth daily at 6 PM. 07/15/14   [name], MD  carvedilol (COREG) 25 MG tablet Take 1 tablet (25 mg total) by mouth 2 (two) times daily. 07/15/14   [name], MD  diclofenac (VOLTAREN) 75 MG EC tablet Take 1 tablet (75 mg total) by mouth 2 (two) times daily. 11/23/14   Lenda Kelp, MD  HYDROcodone-acetaminophen (NORCO/VICODIN) 5-325 MG per tablet Take 1 tablet by mouth every 6 (six) hours as needed for moderate pain or severe pain. 08/22/14   Lenda Kelp, MD  losartan (COZAAR) 50 MG tablet Take 1 tablet (50 mg total) by mouth daily. 07/15/14   [name], MD  methocarbamol (ROBAXIN) 500 MG tablet Take 1 tablet (500 mg total) by mouth every 6 (six) hours as needed for muscle spasms. 11/23/14   Lenda Kelp, MD  spironolactone (ALDACTONE) 25 MG tablet Take 1 tablet (25 mg total) by mouth daily. 07/15/14   [name], MD   BP 122/80 mmHg  Pulse 74  Temp(Src) 98.2 F (36.8 C) (Oral)  Resp 20  Ht  (1.88 m)  Wt 250 lb (113.399 kg)  BMI 32.08 kg/m2  SpO2 100% Physical Exam  Constitutional: He is  oriented to person, place, and time. He appears well-developed and well-nourished. No distress.  HENT:  Head: Normocephalic and atraumatic.  Neck: Normal range of motion. Neck supple.  Cardiovascular: Normal rate, regular rhythm and normal heart sounds.   No murmur heard. Pulmonary/Chest: Effort normal and breath sounds normal. No respiratory distress.  Abdominal: Soft. He exhibits no distension. There is tenderness.  There is mild suprapubic tenderness to palpation. There is no rebound or guarding.  Genitourinary: Penis normal. No penile tenderness.  There are no lesions of the penis or testicles. There is no significant urethral discharge.  Neurological: He is alert and  oriented to person, place, and time.  Skin: He is not diaphoretic.  Nursing note and vitals reviewed.   ED Course  Procedures (including critical care time) Labs Review Labs Reviewed  URINALYSIS, ROUTINE W REFLEX MICROSCOPIC (NOT AT [facility])    Imaging Review No results found.   EKG Interpretation None      MDM   Final diagnoses:  None    Urine is clear and GC cultures are pending. I will treat presumptively for STD as he feels as though this is what he has. To return as needed for any problems. His abdomen is otherwise benign and he has no other complaints.    Geoffery Lyons, MD 12/25/14 [phone]

## 2014-12-26 LAB — GC/CHLAMYDIA PROBE AMP (~~LOC~~) NOT AT ARMC
Chlamydia: NEGATIVE
Neisseria Gonorrhea: NEGATIVE

## 2015-01-05 ENCOUNTER — Ambulatory Visit: Payer: 59 | Admitting: Family Medicine

## 2015-01-30 ENCOUNTER — Encounter: Payer: Self-pay | Admitting: Cardiovascular Disease

## 2015-01-30 ENCOUNTER — Other Ambulatory Visit (INDEPENDENT_AMBULATORY_CARE_PROVIDER_SITE_OTHER): Payer: Commercial Managed Care - HMO | Admitting: *Deleted

## 2015-01-30 ENCOUNTER — Ambulatory Visit (INDEPENDENT_AMBULATORY_CARE_PROVIDER_SITE_OTHER): Payer: Commercial Managed Care - HMO | Admitting: Cardiovascular Disease

## 2015-01-30 ENCOUNTER — Encounter: Payer: Self-pay | Admitting: Nurse Practitioner

## 2015-01-30 VITALS — BP 130/80 | HR 82 | Ht 74.0 in | Wt 256.5 lb

## 2015-01-30 DIAGNOSIS — G459 Transient cerebral ischemic attack, unspecified: Secondary | ICD-10-CM

## 2015-01-30 DIAGNOSIS — I10 Essential (primary) hypertension: Secondary | ICD-10-CM

## 2015-01-30 DIAGNOSIS — I5022 Chronic systolic (congestive) heart failure: Secondary | ICD-10-CM | POA: Diagnosis not present

## 2015-01-30 LAB — BASIC METABOLIC PANEL
BUN: 17 mg/dL (ref 6–23)
CHLORIDE: 105 meq/L (ref 96–112)
CO2: 28 mEq/L (ref 19–32)
CREATININE: 1.18 mg/dL (ref 0.40–1.50)
Calcium: 9.6 mg/dL (ref 8.4–10.5)
GFR: 91.23 mL/min (ref >60.00)
GLUCOSE: 89 mg/dL (ref 70–99)
POTASSIUM: 3.9 meq/L (ref 3.5–5.1)
SODIUM: 140 meq/L (ref 135–145)

## 2015-01-30 MED ORDER — LOSARTAN POTASSIUM 100 MG PO TABS: 100.0000 mg | ORAL_TABLET | Freq: Every day | ORAL | Status: DC

## 2015-01-30 NOTE — Patient Instructions (Signed)
Medication Instructions:  INCREASE Losartan to 100 mg daily  Labwork: Your physician recommends that you return for lab work in: 3 weeks for basic metabolic panel   Testing/Procedures: None Ordered   Follow-Up: Your physician wants you to follow-up in: 3 months with Dr. Elease Hashimoto.  You will receive a reminder letter in the mail two months in advance. If you don't receive a letter, please call our office to schedule the follow-up appointment.

## 2015-01-30 NOTE — Addendum Note (Signed)
Addended by: Tonita Phoenix on: 01/30/2015 10:10 AM   Modules accepted: Orders

## 2015-01-30 NOTE — Progress Notes (Signed)
Cardiology Office Note   Date:  01/30/2015   ID:  KASTIEL SIMONIAN, DOB [birth year], MRN [id number]  PCP:  Default, Provider, MD  Cardiologist:   [name], MD   Chief Complaint  Patient presents with  . Follow-up    chf   1. Congestive heart failure-ejection fraction of 20-25% by echo 2. Restrictive lung disease 3. CVA 4. Right shoulder injury-  History of Present Illness:  Abdallah is a 34 year old gentleman who is a new consultation for further evaluation of his cough. He had an echocardiogram and was found to have ejection fraction of between 20 and 25%. I started him on Lasix 40 mg a day as well as potassium chloride 20 mg a day. He developed severe orthostatic hypotension and felt very poorly after taking one dose Lasix and he stopped.  Surprisingly, he does not have much shortness of breath. His basketball on a regular basis. He is able to play for hours at a time and does not have any significant shortness of breath. He does have a cough. He's tried a Z-Pak which cleared up the greenish color of his sputum but he is still having this cough.  He denies any PND orthopnea. He denies any syncope or presyncope. He denies any chest pain. He does not have any cardiac history.  Dec 02, 2012: Deanthony is feeling a bit better. His cough has improved. He still has episodes of profound fatigue. His breathing is better. He has greatly reduced his salt intake. He still plays basket ball on a regular basis.   March 10, 2013: Nov. 3, 2014: Kaire has gained some weight since his last ov. He complains of a slight head ache / dizziness. He has occasional episodes of orthostasis. Otherwise, he is feeling well - breathing well.   Jan. 30, 2015: Warwick is doing OK. He played basketball 2 weeks ago and felt great. He was able to play without dyspnea.  His most recent echocardiogram in November revealed improvement of his left ventricular systolic function. The official reading on  the echo reports an ejection fraction of 30%  Reviewed the echo and I think that his ejection fraction is actually a little bit better- at least 35%. In one view, the Simpson calculation reported 42%.  October 08, 2013: Tremain seems to be doing fairly well. We increased his losartan to 100 mg a day during his last visit but he started having episodes of lightheadedness any developed a headache. The symptoms resolved when he decreased his dose back down to the 50 mg where we were previously.  Nov 09, 2013: Hayk is doing ok.   December 10, 2013: cashus is doing well  Sept. 8, 2015: Devarius is doing well. No dyspnea. His last echocardiogram revealed his ejection fraction had decreased back down to 25%. He thinks that his EF has gone back up because he is not having shortness breath and his cough is better.  We have referred him to [name] for consideration for an ICD. He does not have a left bundle branch block so I do not think that he needs a CRT.   Jan. 8, 2016: Con had another TIA recently. Was in the hospital. CT showed no new CVA He had stopped his Xarelto.[name] He has run out of his Xarelto again. Also ran out of the losartan or another med. He is feeling much better.   Feb. 18, 2016:  TAKAHIRO GODINHO is a 34 y.o. male who presents for follow up  of his chronic systolic CHf.   He has a right injury - needs to take NSAID.  His sports medicine doctor would not give him anything yet.    November 02, 2014: Azzan is seen back today for follow up visit.  Recent echo shows persistent LV dysfunction - EF 25-30%. Has been on a diet.  Has lost 15 lbs.   Has occasional episodes of orthostasis - I do not think that we can increase his meds any further .   January 30, 2015:  Eray is doing well. Has seen EP. Just got a new position and wants to wait until he is out of the probation  period.  Is taking his meds regularly   Past Medical History  Diagnosis Date  . Cough     a. PFTS 09/2012:  restriction probable, further examinations recommended. Saw pulm 11/2012: placed on GERD rx and short course prednisone, planned to regroup.  [name] Nonischemic cardiomyopathy     a. Identified 10/2012 by echo EF 20-25%. b. Echo 01/2013: EF 20-25%, TEE EF 15% with severe RV dysfunction as well.  [name] History of palpitations     2x/month for several years feels heart racing  . CVA (cerebral vascular accident) 01/23/2013    tPA administered; MRI brain showed small acute R MCA infarct  . PFO (patent foramen ovale)     a. By TEE 01/2013.  [name] CHF (congestive heart failure)     Past Surgical History  Procedure Laterality Date  . No past surgeries    . Tee without cardioversion N/A 01/26/2013    Procedure: TRANSESOPHAGEAL ECHOCARDIOGRAM (TEE);  Surgeon: [name], MD;  Location: [facility] ENDOSCOPY;  Service: Cardiovascular;  Laterality: N/A;     Current Outpatient Prescriptions  Medication Sig Dispense Refill  . atorvastatin (LIPITOR) 10 MG tablet Take 1 tablet (10 mg total) by mouth daily at 6 PM. 31 tablet 11  . carvedilol (COREG) 25 MG tablet Take 1 tablet (25 mg total) by mouth 2 (two) times daily. 62 tablet 11  . diclofenac (VOLTAREN) 75 MG EC tablet Take 1 tablet (75 mg total) by mouth 2 (two) times daily. 60 tablet 1  . HYDROcodone-acetaminophen (NORCO/VICODIN) 5-325 MG per tablet Take 1 tablet by mouth every 6 (six) hours as needed for moderate pain or severe pain. 40 tablet 0  . losartan (COZAAR) 50 MG tablet Take 1 tablet (50 mg total) by mouth daily. 31 tablet 11  . methocarbamol (ROBAXIN) 500 MG tablet Take 1 tablet (500 mg total) by mouth every 6 (six) hours as needed for muscle spasms. 60 tablet 1  . spironolactone (ALDACTONE) 25 MG tablet Take 1 tablet (25 mg total) by mouth daily. 31 tablet 11  . XARELTO 20 MG TABS tablet   0   No current facility-administered medications for this visit.    Allergies:   Review of patient's allergies indicates no known allergies.    Social History:   The patient  reports that he has never smoked. He does not have any smokeless tobacco history on file. He reports that he does not drink alcohol or use illicit drugs.   Family History:  The patient's family history includes Heart Problems in his mother; Sarcoidosis in his mother.    ROS:  Please see the history of present illness.    Review of Systems: Constitutional:  denies fever, chills, diaphoresis, appetite change and fatigue.  HEENT: denies photophobia, eye pain, redness, hearing loss, ear pain, congestion, sore throat, rhinorrhea, sneezing, neck pain,  neck stiffness and tinnitus.  Respiratory: denies SOB, DOE, cough, chest tightness, and wheezing.  Cardiovascular: denies chest pain, palpitations and leg swelling.  Gastrointestinal: denies nausea, vomiting, abdominal pain, diarrhea, constipation, blood in stool.  Genitourinary: denies dysuria, urgency, frequency, hematuria, flank pain and difficulty urinating.  Musculoskeletal: denies  myalgias, back pain, joint swelling, arthralgias and gait problem.   Skin: denies pallor, rash and wound.  Neurological: denies dizziness, seizures, syncope, weakness, light-headedness, numbness and headaches.   Hematological: denies adenopathy, easy bruising, personal or family bleeding history.  Psychiatric/ Behavioral: denies suicidal ideation, mood changes, confusion, nervousness, sleep disturbance and agitation.       All other systems are reviewed and negative.    PHYSICAL EXAM: VS:  BP 130/80 mmHg  Pulse 82  Ht 6\' 2"  (1.88 m)  Wt 116.348 kg (256 lb 8 oz)  BMI 32.92 kg/m2  SpO2 95% , BMI Body mass index is 32.92 kg/(m^2). GEN: Well nourished, well developed, in no acute distress HEENT: normal Neck: no JVD, carotid bruits, or masses Cardiac: RRR; no murmurs, rubs, or gallops,no edema  Respiratory:  clear to auscultation bilaterally, normal work of breathing GI: soft, nontender, nondistended, + BS MS: no deformity or atrophy Skin:  warm and dry, no rash Neuro:  Strength and sensation are intact Psych: normal   EKG:  EKG is not ordered today.    Recent Labs: 05/19/2014: Pro B Natriuretic peptide (BNP) 10.8 05/20/2014: ALT 22; Hemoglobin 16.2; Platelets 273 11/09/2014: BUN 17; Creatinine, Ser 1.20; Potassium 4.1; Sodium 136    Lipid Panel    Component Value Date/Time   CHOL 162 05/20/2014 0026   TRIG 141 05/20/2014 0026   HDL 45 05/20/2014 0026   CHOLHDL 3.6 05/20/2014 0026   VLDL 28 05/20/2014 0026   LDLCALC 89 05/20/2014 0026      Wt Readings from Last 3 Encounters:  01/30/15 116.348 kg (256 lb 8 oz)  12/25/14 113.399 kg (250 lb)  11/23/14 112.038 kg (247 lb)      Other studies Reviewed: Additional studies/ records that were reviewed today include: . Review of the above records demonstrates:    ASSESSMENT AND PLAN:  1. Chronic systolic Congestive heart failure-his ejection fraction has improved slightly but is still 25-30%. He's basically asymptomatic. He plays basketball on a regular basis without chest pain or shortness of breath.  We need to refer him to repeat for consideration for ICD. I've had a long discussion with him and he now agrees to be seen for consideration for an ICD.  We'll increase his losartan to 100 mg a day. We'll check a basic medical profile in 3 weeks . I will see him again in 3 months. He he does agree to have the subcutaneous ICD. He wants to wait a little while until he is out of his probationary period at work.  2. Restrictive lung disease 3. CVA - he has an EF of 25-30% .  Has had a CVA  4. Right shoulder injury.  Current medicines are reviewed at length with the patient today.  The patient does not have concerns regarding medicines.  The following changes have been made:  no change   Disposition:   FU with me in 3 months     Signed, Nahser, Deloris Ping, MD  01/30/2015 11:00 AM    [facility] [address] [place], [place], [place]   [address] Phone: [phone]; Fax: [phone]

## 2015-02-20 ENCOUNTER — Encounter: Payer: Self-pay | Admitting: *Deleted

## 2015-02-20 ENCOUNTER — Other Ambulatory Visit (INDEPENDENT_AMBULATORY_CARE_PROVIDER_SITE_OTHER): Payer: Commercial Managed Care - HMO | Admitting: *Deleted

## 2015-02-20 DIAGNOSIS — I5022 Chronic systolic (congestive) heart failure: Secondary | ICD-10-CM | POA: Diagnosis not present

## 2015-02-20 LAB — BASIC METABOLIC PANEL
BUN: 21 mg/dL (ref 6–23)
CO2: 27 meq/L (ref 19–32)
CREATININE: 1.07 mg/dL (ref 0.40–1.50)
Calcium: 10 mg/dL (ref 8.4–10.5)
Chloride: 107 mEq/L (ref 96–112)
GFR: 102.1 mL/min (ref >60.00)
GLUCOSE: 101 mg/dL — AB (ref 70–99)
POTASSIUM: 4 meq/L (ref 3.5–5.1)
Sodium: 140 mEq/L (ref 135–145)

## 2015-05-03 ENCOUNTER — Ambulatory Visit: Payer: Self-pay | Admitting: Cardiovascular Disease

## 2015-05-17 ENCOUNTER — Encounter: Payer: Self-pay | Admitting: Cardiovascular Disease

## 2015-05-17 ENCOUNTER — Ambulatory Visit (INDEPENDENT_AMBULATORY_CARE_PROVIDER_SITE_OTHER): Payer: Commercial Managed Care - HMO | Admitting: Cardiovascular Disease

## 2015-05-17 VITALS — BP 94/68 | HR 83 | Ht 74.0 in | Wt 264.1 lb

## 2015-05-17 DIAGNOSIS — I5022 Chronic systolic (congestive) heart failure: Secondary | ICD-10-CM

## 2015-05-17 NOTE — Patient Instructions (Signed)
Medication Instructions:  Your physician recommends that you continue on your current medications as directed. Please refer to the Current Medication list given to you today.   Labwork: None Ordered   Testing/Procedures: None Ordered   Follow-Up: You have been referred to [name] for EP Consult for device   Your physician wants you to follow-up in: 6 months with Dr. Elease Hashimoto.  You will receive a reminder letter in the mail two months in advance. If you don't receive a letter, please call our office to schedule the follow-up appointment.   If you need a refill on your cardiac medications before your next appointment, please call your pharmacy.   Thank you for choosing [facility]! Eligha Bridegroom, RN [phone]

## 2015-05-17 NOTE — Progress Notes (Signed)
Cardiology Office Note   Date:  05/17/2015   ID:  Nathan Baird, DOB 1981/06/07, MRN [id number]  PCP:  Default, Provider, MD  Cardiologist:   [name], MD   Chief Complaint  Patient presents with  . Follow-up    CHF   1. Congestive heart failure-ejection fraction of 20-25% by echo 2. Restrictive lung disease 3. CVA 4. Right shoulder injury-  History of Present Illness:  Nathan Baird is a 34 year old gentleman who is a new consultation for further evaluation of his cough. He had an echocardiogram and was found to have ejection fraction of between 20 and 25%. I started him on Lasix 40 mg a day as well as potassium chloride 20 mg a day. He developed severe orthostatic hypotension and felt very poorly after taking one dose Lasix and he stopped.  Surprisingly, he does not have much shortness of breath. His basketball on a regular basis. He is able to play for hours at a time and does not have any significant shortness of breath. He does have a cough. He's tried a Z-Pak which cleared up the greenish color of his sputum but he is still having this cough.  He denies any PND orthopnea. He denies any syncope or presyncope. He denies any chest pain. He does not have any cardiac history.  Dec 02, 2012: Nathan Baird is feeling a bit better. His cough has improved. He still has episodes of profound fatigue. His breathing is better. He has greatly reduced his salt intake. He still plays basket ball on a regular basis.   March 10, 2013: Nov. 3, 2014: Nathan Baird has gained some weight since his last ov. He complains of a slight head ache / dizziness. He has occasional episodes of orthostasis. Otherwise, he is feeling well - breathing well.   Jan. 30, 2015: Nathan Baird is doing OK. He played basketball 2 weeks ago and felt great. He was able to play without dyspnea.  His most recent echocardiogram in November revealed improvement of his left ventricular systolic function. The official reading on  the echo reports an ejection fraction of 30%  Reviewed the echo and I think that his ejection fraction is actually a little bit better- at least 35%. In one view, the Simpson calculation reported 42%.  October 08, 2013: Nathan Baird seems to be doing fairly well. We increased his losartan to 100 mg a day during his last visit but he started having episodes of lightheadedness any developed a headache. The symptoms resolved when he decreased his dose back down to the 50 mg where we were previously.  Nov 09, 2013: Nathan Baird is doing ok.   December 10, 2013: Nathan Baird is doing well  Sept. 8, 2015: Nathan Baird is doing well. No dyspnea. His last echocardiogram revealed his ejection fraction had decreased back down to 25%. He thinks that his EF has gone back up because he is not having shortness breath and his cough is better.  We have referred him to [name] for consideration for an ICD. He does not have a left bundle branch block so I do not think that he needs a CRT.   Jan. 8, 2016: Nathan Baird had another TIA recently. Was in the hospital. CT showed no new CVA He had stopped his Xarelto.Nathan Baird He has run out of his Xarelto again. Also ran out of the losartan or another med. He is feeling much better.   Feb. 18, 2016:  Nathan Baird is a 34 y.o. male who presents for follow up  of his chronic systolic CHf.   He has a right injury - needs to take NSAID.  His sports medicine doctor would not give him anything yet.    November 02, 2014: Nathan Baird is seen back today for follow up visit.  Recent echo shows persistent LV dysfunction - EF 25-30%. Has been on a diet.  Has lost 15 lbs.   Has occasional episodes of orthostasis - I do not think that we can increase his meds any further .   January 30, 2015:  Nathan Baird is doing well. Has seen EP. Just got a new position and wants to wait until he is out of the probation  period.  Is taking his meds regularly   Nov. 9, 2016:  Doing well.  Has gained some weight .  Wants to go ahead  with the ICD.   Last EF earlier this year is 25-30%.     Past Medical History  Diagnosis Date  . Cough     a. PFTS 09/2012: restriction probable, further examinations recommended. Saw pulm 11/2012: placed on GERD rx and short course prednisone, planned to regroup.  Nathan Baird Nonischemic cardiomyopathy (HCC)     a. Identified 10/2012 by echo EF 20-25%. b. Echo 01/2013: EF 20-25%, TEE EF 15% with severe RV dysfunction as well.  Nathan Baird History of palpitations     2x/month for several years feels heart racing  . CVA (cerebral vascular accident) (HCC) 01/23/2013    tPA administered; MRI brain showed small acute R MCA infarct  . PFO (patent foramen ovale)     a. By TEE 01/2013.  Nathan Baird CHF (congestive heart failure) [facility])     Past Surgical History  Procedure Laterality Date  . No past surgeries    . Tee without cardioversion N/A 01/26/2013    Procedure: TRANSESOPHAGEAL ECHOCARDIOGRAM (TEE);  Surgeon: [name], MD;  Location: [facility] ENDOSCOPY;  Service: Cardiovascular;  Laterality: N/A;     Current Outpatient Prescriptions  Medication Sig Dispense Refill  . atorvastatin (LIPITOR) 10 MG tablet Take 1 tablet (10 mg total) by mouth daily at 6 PM. 31 tablet 11  . carvedilol (COREG) 25 MG tablet Take 1 tablet (25 mg total) by mouth 2 (two) times daily. 62 tablet 11  . diclofenac (VOLTAREN) 75 MG EC tablet Take 1 tablet (75 mg total) by mouth 2 (two) times daily. 60 tablet 1  . losartan (COZAAR) 100 MG tablet Take 1 tablet (100 mg total) by mouth daily. 31 tablet 11  . spironolactone (ALDACTONE) 25 MG tablet Take 1 tablet (25 mg total) by mouth daily. 31 tablet 11  . XARELTO 20 MG TABS tablet Take 20 mg by mouth daily with supper.   0  . HYDROcodone-acetaminophen (NORCO/VICODIN) 5-325 MG per tablet Take 1 tablet by mouth every 6 (six) hours as needed for moderate pain or severe pain. (Patient not taking: Reported on 05/17/2015) 40 tablet 0  . methocarbamol (ROBAXIN) 500 MG tablet Take 1 tablet (500 mg total) by  mouth every 6 (six) hours as needed for muscle spasms. (Patient not taking: Reported on 05/17/2015) 60 tablet 1   No current facility-administered medications for this visit.    Allergies:   Review of patient's allergies indicates no known allergies.    Social History:  The patient  reports that he has never smoked. He does not have any smokeless tobacco history on file. He reports that he does not drink alcohol or use illicit drugs.   Family History:  The patient's family history  includes Heart Problems in his mother; Sarcoidosis in his mother.    ROS:  Please see the history of present illness.    Review of Systems: Constitutional:  denies fever, chills, diaphoresis, appetite change and fatigue.  HEENT: denies photophobia, eye pain, redness, hearing loss, ear pain, congestion, sore throat, rhinorrhea, sneezing, neck pain, neck stiffness and tinnitus.  Respiratory: denies SOB, DOE, cough, chest tightness, and wheezing.  Cardiovascular: denies chest pain, palpitations and leg swelling.  Gastrointestinal: denies nausea, vomiting, abdominal pain, diarrhea, constipation, blood in stool.  Genitourinary: denies dysuria, urgency, frequency, hematuria, flank pain and difficulty urinating.  Musculoskeletal: denies  myalgias, back pain, joint swelling, arthralgias and gait problem.   Skin: denies pallor, rash and wound.  Neurological: denies dizziness, seizures, syncope, weakness, light-headedness, numbness and headaches.   Hematological: denies adenopathy, easy bruising, personal or family bleeding history.  Psychiatric/ Behavioral: denies suicidal ideation, mood changes, confusion, nervousness, sleep disturbance and agitation.       All other systems are reviewed and negative.    PHYSICAL EXAM: VS:  BP 94/68 mmHg  Pulse 83  Ht 6\' 2"  (1.88 m)  Wt 264 lb 1.9 oz (119.804 kg)  BMI 33.90 kg/m2  SpO2 98% , BMI Body mass index is 33.9 kg/(m^2). GEN: Well nourished, well developed, in no  acute distress HEENT: normal Neck: no JVD, carotid bruits, or masses Cardiac: RRR; no murmurs, rubs, or gallops,no edema  Respiratory:  clear to auscultation bilaterally, normal work of breathing GI: soft, nontender, nondistended, + BS MS: no deformity or atrophy Skin: warm and dry, no rash Neuro:  Strength and sensation are intact Psych: normal   EKG:  EKG is not ordered today.    Recent Labs: 05/19/2014: Pro B Natriuretic peptide (BNP) 10.8 05/20/2014: ALT 22; Hemoglobin 16.2; Platelets 273 02/20/2015: BUN 21; Creatinine, Ser 1.07; Potassium 4.0; Sodium 140    Lipid Panel    Component Value Date/Time   CHOL 162 05/20/2014 0026   TRIG 141 05/20/2014 0026   HDL 45 05/20/2014 0026   CHOLHDL 3.6 05/20/2014 0026   VLDL 28 05/20/2014 0026   LDLCALC 89 05/20/2014 0026      Wt Readings from Last 3 Encounters:  05/17/15 264 lb 1.9 oz (119.804 kg)  01/30/15 256 lb 8 oz (116.348 kg)  12/25/14 250 lb (113.399 kg)      Other studies Reviewed: Additional studies/ records that were reviewed today include: . Review of the above records demonstrates:    ASSESSMENT AND PLAN:  1. Chronic systolic Congestive heart failure-his ejection fraction has improved slightly but is still 25-30%. He's basically asymptomatic. He plays basketball on a regular basis without chest pain or shortness of breath.  We need to refer him to repeat for consideration for ICD. I've had a long discussion with him and he now agrees to be seen for consideration for an ICD. Has seen  Dr. Johney Frame.  . Dr. Johney Frame thinks that he needs a subcutaneous ICD given his young age. We will refer him to [name] for further evaluation.   2. Restrictive lung disease 3. CVA - he has an EF of 25-30% .  Has had a CVA  4. Right shoulder injury.  Current medicines are reviewed at length with the patient today.  The patient does not have concerns regarding medicines.  The following changes have been made:  no  change   Disposition:   FU with me in 6 months  .  Will see [name] for EP eval.  Signed, Soliana Kitko, Deloris Ping, MD  05/17/2015 10:40 AM    [facility] [address] [place], [place], [place]  [address] Phone: [phone]; Fax: [phone]

## 2015-05-24 ENCOUNTER — Ambulatory Visit (INDEPENDENT_AMBULATORY_CARE_PROVIDER_SITE_OTHER): Payer: Commercial Managed Care - HMO | Admitting: Internal Medicine

## 2015-05-24 ENCOUNTER — Encounter: Payer: Self-pay | Admitting: Internal Medicine

## 2015-05-24 VITALS — BP 108/72 | HR 72 | Ht 74.0 in | Wt 264.0 lb

## 2015-05-24 DIAGNOSIS — I5022 Chronic systolic (congestive) heart failure: Secondary | ICD-10-CM

## 2015-05-24 MED ORDER — SACUBITRIL-VALSARTAN 49-51 MG PO TABS: 1.0000 | ORAL_TABLET | Freq: Two times a day (BID) | ORAL | Status: DC

## 2015-05-24 NOTE — Patient Instructions (Signed)
Medication Instructions:  Your physician has recommended you make the following change in your medication: Stop Losartan.  Start Entresto 49/51 mg by mouth twice daily.    Labwork: Your physician recommends that you return for lab work in: 2 weeks. --BMP.  Will need blood pressure check in pharmacy clinic the same day.    Testing/Procedures: none  Follow-Up:   2 weeks with pharmacy clinic for BP check.  Lab work to be done same day.   Any Other Special Instructions Will Be Listed Below (If Applicable).     If you need a refill on your cardiac medications before your next appointment, please call your pharmacy.

## 2015-05-24 NOTE — Progress Notes (Signed)
ELECTROPHYSIOLOGY CONSULT NOTE  Patient ID: Nathan Baird, MRN: [id number], DOB/AGE: 09/06/1980 34 y.o. Admit date: (Not on file) Date of Consult: 05/24/2015  Primary Physician: No PCP Per Patient Primary Cardiologist: Pna  Chief Complaint: ICD   HPI Nathan Baird is a 34 y.o. male  Seen for consideration of an ICD.  He presented in 2014 with evidence of congestive heart failure. He has a history of a nonischemic cardiomyopathy.He has been on guideline directed medical therapy for some time. He is quite fit and placed basketball but most recently had to stop because he became presyncopal. He does not have orthopnea nocturnal dyspnea or peripheral edema and sleeps flat in bed.     DATE TEST    7/14 echo   EF20-25%  no evidence of LVH  8/14 MRI Ef 26% Consistent with noncompaction  No scar  4/16 echo   EF 25-30%    No testing for myocardial perfusion has been done that I can find in the medical record.  His father has a history of a cardiomyopathy. His mother has a history of coronary artery disease. He himself has 4 children; they have separate mothers but he is involved with   all of them  He has a history of recurrent TIA.he was treated with Rivaroxaban   Past Medical History  Diagnosis Date  . Cough     a. PFTS 09/2012: restriction probable, further examinations recommended. Saw pulm 11/2012: placed on GERD rx and short course prednisone, planned to regroup.  [name] Nonischemic cardiomyopathy (HCC)     a. Identified 10/2012 by echo EF 20-25%. b. Echo 01/2013: EF 20-25%, TEE EF 15% with severe RV dysfunction as well.  [name] History of palpitations     2x/month for several years feels heart racing  . CVA (cerebral vascular accident) (HCC) 01/23/2013    tPA administered; MRI brain showed small acute R MCA infarct  . PFO (patent foramen ovale)     a. By TEE 01/2013.  [name] CHF (congestive heart failure) [facility])       Surgical History:  Past Surgical History  Procedure Laterality  Date  . No past surgeries    . Tee without cardioversion N/A 01/26/2013    Procedure: TRANSESOPHAGEAL ECHOCARDIOGRAM (TEE);  Surgeon: [name], MD;  Location: [facility] ENDOSCOPY;  Service: Cardiovascular;  Laterality: N/A;     Home Meds: Prior to Admission medications   Medication Sig Start Date End Date Taking? Authorizing Provider  carvedilol (COREG) 25 MG tablet Take 1 tablet (25 mg total) by mouth 2 (two) times daily. 07/15/14  Yes [name], MD  HYDROcodone-acetaminophen (NORCO/VICODIN) 5-325 MG per tablet Take 1 tablet by mouth every 6 (six) hours as needed for moderate pain or severe pain. 08/22/14  Yes Lenda Kelp, MD  losartan (COZAAR) 100 MG tablet Take 1 tablet (100 mg total) by mouth daily. 01/30/15  Yes [name], MD  methocarbamol (ROBAXIN) 500 MG tablet Take 1 tablet (500 mg total) by mouth every 6 (six) hours as needed for muscle spasms. 11/23/14  Yes Lenda Kelp, MD  spironolactone (ALDACTONE) 25 MG tablet Take 1 tablet (25 mg total) by mouth daily. 07/15/14  Yes [name], MD  XARELTO 20 MG TABS tablet Take 20 mg by mouth daily with supper.  01/10/15  Yes Historical Provider, MD  atorvastatin (LIPITOR) 10 MG tablet Take 1 tablet (10 mg total) by mouth daily at 6 PM. Patient not taking: Reported on  05/24/2015 07/15/14   [name], MD  diclofenac (VOLTAREN) 75 MG EC tablet Take 1 tablet (75 mg total) by mouth 2 (two) times daily. Patient not taking: Reported on 05/24/2015 11/23/14   Lenda Kelp, MD    Allergies: No Known Allergies  Social History   Social History  . Marital Status: Single    Spouse Name: N/A  . Number of Children: N/A  . Years of Education: N/A   Occupational History  . Not on file.   Social History Main Topics  . Smoking status: Never Smoker   . Smokeless tobacco: Not on file  . Alcohol Use: No  . Drug Use: No  . Sexual Activity: Yes   Other Topics Concern  . Not on file   Social History Narrative   Marital  status: single     Children: 4 children (70, 51, 2, 42month old)      Lives: with girlfriend in [place].      Employment: works for city of [employer]      Exercise: basketball every Friday night.     Family History  Problem Relation Age of Onset  . Sarcoidosis Mother   . Heart Problems Mother     PPM implantation age 12      ROS:  Please see the history of present illness.     All other systems reviewed and negative.    Physical Exam:   Blood pressure 108/72, pulse 72, height  (1.88 m), weight 264 lb (119.75 kg). General: Well developed, well nourished male in no acute distress. Head: Normocephalic, atraumatic, sclera non-icteric, no xanthomas, nares are without discharge. EENT: normal  Lymph Nodes:  none Neck: Negative for carotid bruits. JVD not elevated. Back:without scoliosis kyphosis  Lungs: Clear bilaterally to auscultation without wheezes, rales, or rhonchi. Breathing is unlabored. Heart: RRR with S1 S2. No   murmur . No rubs, or gallops appreciated. Abdomen: Soft, non-tender, non-distended with normoactive bowel sounds. No hepatomegaly. No rebound/guarding. No obvious abdominal masses. Msk:  Strength and tone appear normal for age. Extremities: No clubbing or cyanosis. No  edema.  Distal pedal pulses are 2+ and equal bilaterally. Skin: Warm and Dry Neuro: Alert and oriented X 3. CN III-XII intact Grossly normal sensory and motor function . Psych:  Responds to questions appropriately with a normal affect.      Labs: Cardiac Enzymes No results for input(s): CKTOTAL, CKMB, TROPONINI in the last 72 hours. CBC Lab Results  Component Value Date   WBC 6.0 05/20/2014   HGB 16.2 05/20/2014   HCT 47.0 05/20/2014   MCV 86.4 05/20/2014   PLT 273 05/20/2014   PROTIME: No results for input(s): LABPROT, INR in the last 72 hours. Chemistry No results for input(s): NA, K, CL, CO2, BUN, CREATININE, CALCIUM, PROT, BILITOT, ALKPHOS, ALT, AST, GLUCOSE in  the last 168 hours.  Invalid input(s): LABALBU Lipids Lab Results  Component Value Date   CHOL 162 05/20/2014   HDL 45 05/20/2014   LDLCALC 89 05/20/2014   TRIG 141 05/20/2014   BNP PRO B NATRIURETIC PEPTIDE (BNP)  Date/Time Value Ref Range Status  05/19/2014 07:21 PM 10.8 0 - 125 pg/mL Final  08/06/2013 09:34 AM 9.0 0.0 - 100.0 pg/mL Final  05/10/2013 09:52 AM 12.0 0.0 - 100.0 pg/mL Final  12/04/2012 07:36 PM 136.8* 0 - 125 pg/mL Final   Thyroid Function Tests: No results for input(s): TSH, T4TOTAL, T3FREE, THYROIDAB in the last 72 hours.  Invalid input(s): FREET3  Miscellaneous Lab Results  Component Value Date   DDIMER  05/24/2009    0.46        AT THE INHOUSE ESTABLISHED CUTOFF VALUE OF 0.48 ug/mL FEU, THIS ASSAY HAS BEEN DOCUMENTED IN THE LITERATURE TO HAVE A SENSITIVITY AND NEGATIVE PREDICTIVE VALUE OF AT LEAST 98 TO 99%.  THE TEST RESULT SHOULD BE CORRELATED WITH AN ASSESSMENT OF THE CLINICAL PROBABILITY OF DVT / VTE.    Radiology/Studies:  No results found.  EKG: sinus rhythm at 72 Interval 17/09/39 Axis LXVII Lateral T-wave inversions   Assessment and Plan Cardiomyopathy with MRI question of non-compaction  Congestive heart failure-chronic-systolic class II  The patient has persistent left ventricular dysfunction despite guidelines directed medical therapy. It is reasonable at this point to consider ICD implantation for primary prevention and we have discussed S ICD versus transvenous ICD and he would prefer the former. He has been mapped and is acceptable.  Prior to this, however, becomes prudent to try to understand the mechanism of his cardiomyopathy. Following the patient's visit, I discussed it with Dr. Jamse Mead; he tells me that he had had the MRI reviewed by Dr. Verlin Fester and there was no clear evidence of non-compaction; this would be consistent with the echocardiograms.  Hence, it is probably reasonable to proceed. We will pursue a family history as his  father had cardiomyopathy. He has a sister who has no known history.  In the interim, we will switch him from [facility] based on the recommendations. He admits to a fair amount of anxiety given his cardiac condition. He remained vigorously active.      Sherryl Manges

## 2015-05-26 ENCOUNTER — Telehealth: Payer: Self-pay | Admitting: Internal Medicine

## 2015-05-26 DIAGNOSIS — I428 Other cardiomyopathies: Secondary | ICD-10-CM

## 2015-05-26 NOTE — Telephone Encounter (Signed)
Per Dr. Graciela HusbandsHerbert Seta, we can go ahead and schedule him for S ICD. He has been mapped. Can we arranged a Myoview prior to that however. Thank.

## 2015-06-07 ENCOUNTER — Ambulatory Visit (INDEPENDENT_AMBULATORY_CARE_PROVIDER_SITE_OTHER): Payer: Commercial Managed Care - HMO | Admitting: Pharmacist

## 2015-06-07 ENCOUNTER — Other Ambulatory Visit (INDEPENDENT_AMBULATORY_CARE_PROVIDER_SITE_OTHER): Payer: Commercial Managed Care - HMO | Admitting: *Deleted

## 2015-06-07 VITALS — BP 118/76

## 2015-06-07 DIAGNOSIS — I5022 Chronic systolic (congestive) heart failure: Secondary | ICD-10-CM | POA: Diagnosis not present

## 2015-06-07 LAB — BASIC METABOLIC PANEL
BUN: 14 mg/dL (ref 7–25)
CO2: 27 mmol/L (ref 20–31)
Calcium: 9.6 mg/dL (ref 8.6–10.3)
Chloride: 104 mmol/L (ref 98–110)
Creat: 1.04 mg/dL (ref 0.60–1.35)
GLUCOSE: 82 mg/dL (ref 65–99)
POTASSIUM: 3.9 mmol/L (ref 3.5–5.3)
Sodium: 140 mmol/L (ref 135–146)

## 2015-06-07 NOTE — Progress Notes (Signed)
    HPI Nathan Baird is a 34 y.o. male patient of Dr. Graciela Baird who was referred to the HTN clinic for follow up of medication changes.  Dr. Graciela Baird saw him on 11/16 for consideration of an ICD.  He has a PMH significant for nonischemic cardiomyopathy, CHF with EF 25-30%, and CVA.  Pt was being treated with carvediolol 25mg  BID, losartan 100mg  daily, and spironolactone 25mg  daily.  Dr. Graciela Baird stopped his losartan and changed him to [facility] 49/51mg  BID.  He is here today for follow up of his BP.  He states he is doing fine with Entresto except he does have a dry cough.  He states it is very mild and tolerable.   BP goal: < 140/90  Current Medications Carvediolol 25mg  BID Spironolactone 25mg  daily Entresto 49/51 mg BID  BP Readings from Last 3 Encounters:  06/07/15 118/76  05/24/15 108/72  05/17/15 94/68    Past Medical History  Diagnosis Date  . Cough     a. PFTS 09/2012: restriction probable, further examinations recommended. Saw pulm 11/2012: placed on GERD rx and short course prednisone, planned to regroup.  [name] Nonischemic cardiomyopathy (HCC)     a. Identified 10/2012 by echo EF 20-25%. b. Echo 01/2013: EF 20-25%, TEE EF 15% with severe RV dysfunction as well.  [name] History of palpitations     2x/month for several years feels heart racing  . CVA (cerebral vascular accident) (HCC) 01/23/2013    tPA administered; MRI brain showed small acute R MCA infarct  . PFO (patent foramen ovale)     a. By TEE 01/2013.  [name] CHF (congestive heart failure) (HCC)      Current Outpatient Prescriptions  Medication Sig Dispense Refill  . atorvastatin (LIPITOR) 10 MG tablet Take 1 tablet (10 mg total) by mouth daily at 6 PM. (Patient not taking: Reported on 05/24/2015) 31 tablet 11  . carvedilol (COREG) 25 MG tablet Take 1 tablet (25 mg total) by mouth 2 (two) times daily. 62 tablet 11  . diclofenac (VOLTAREN) 75 MG EC tablet Take 1 tablet (75 mg total) by mouth 2 (two) times daily. (Patient not taking: Reported  on 05/24/2015) 60 tablet 1  . HYDROcodone-acetaminophen (NORCO/VICODIN) 5-325 MG per tablet Take 1 tablet by mouth every 6 (six) hours as needed for moderate pain or severe pain. 40 tablet 0  . methocarbamol (ROBAXIN) 500 MG tablet Take 1 tablet (500 mg total) by mouth every 6 (six) hours as needed for muscle spasms. 60 tablet 1  . sacubitril-valsartan (ENTRESTO) 49-51 MG Take 1 tablet by mouth 2 (two) times daily. 60 tablet 6  . spironolactone (ALDACTONE) 25 MG tablet Take 1 tablet (25 mg total) by mouth daily. 31 tablet 11  . XARELTO 20 MG TABS tablet Take 20 mg by mouth daily with supper.   0   No current facility-administered medications for this visit.      Allergies: No Known Allergies   Assessment and Plan 1.  CHF-  Pt's BP at goal on Entresto.  He is having some cough related to ARB, however he did not have this with losartan.  He states it is tolerable now and he will continue Entresto.  Instructed him to call us if it gets too worrisome and we will just need to change back to the losartan.      Nathan Baird R

## 2015-06-09 NOTE — Telephone Encounter (Signed)
I called and spoke with the patient per Dr. Odessa Fleming request: 1) Per [name]- make patient aware that there was no non-compaction noted on his cardiac MRI. 2) Schedule for ETT- myoview prior to S-ICD implant.  The patient is aware of the above and agreeable with proceeding with stress testing. He is aware scheduling will be in touch with him to set this up and I will call him back once approval/ denial has been received for his S-ICD. He voices understanding.

## 2015-06-20 ENCOUNTER — Telehealth (HOSPITAL_COMMUNITY): Payer: Self-pay | Admitting: *Deleted

## 2015-06-20 NOTE — Telephone Encounter (Signed)
Patient given detailed instructions per Myocardial Perfusion Study Information Sheet for the test on 06/22/15 at 0745. Patient notified to arrive 15 minutes early and that it is imperative to arrive on time for appointment to keep from having the test rescheduled.  If you need to cancel or reschedule your appointment, please call the office within 24 hours of your appointment. Failure to do so may result in a cancellation of your appointment, and a $50 no show fee. Patient verbalized understanding.Cherlynn Popiel, Adelene Idler

## 2015-06-22 ENCOUNTER — Ambulatory Visit (HOSPITAL_COMMUNITY): Payer: Commercial Managed Care - HMO | Attending: Cardiology

## 2015-06-22 DIAGNOSIS — R002 Palpitations: Secondary | ICD-10-CM | POA: Insufficient documentation

## 2015-06-22 DIAGNOSIS — I10 Essential (primary) hypertension: Secondary | ICD-10-CM | POA: Diagnosis not present

## 2015-06-22 DIAGNOSIS — I517 Cardiomegaly: Secondary | ICD-10-CM | POA: Insufficient documentation

## 2015-06-22 DIAGNOSIS — I429 Cardiomyopathy, unspecified: Secondary | ICD-10-CM | POA: Diagnosis not present

## 2015-06-22 DIAGNOSIS — I428 Other cardiomyopathies: Secondary | ICD-10-CM

## 2015-06-22 LAB — MYOCARDIAL PERFUSION IMAGING
CHL CUP NUCLEAR SSS: 4
CHL RATE OF PERCEIVED EXERTION: 18
CSEPEW: 10.1 METS
CSEPHR: 92 %
CSEPPHR: 173 {beats}/min
Exercise duration (min): 9 min
Exercise duration (sec): 0 s
LHR: 0.33
LV dias vol: 209 mL
LVSYSVOL: 138 mL
MPHR: 187 {beats}/min
NUC STRESS TID: 0.91
Rest HR: 61 {beats}/min
SDS: 3
SRS: 1

## 2015-06-22 MED ORDER — TECHNETIUM TC 99M SESTAMIBI GENERIC - CARDIOLITE
32.7000 | Freq: Once | INTRAVENOUS | Status: AC | PRN
  Administered 2015-06-22: 32.7 via INTRAVENOUS

## 2015-06-22 MED ORDER — TECHNETIUM TC 99M SESTAMIBI GENERIC - CARDIOLITE
10.7000 | Freq: Once | INTRAVENOUS | Status: AC | PRN
  Administered 2015-06-22: 11 via INTRAVENOUS

## 2015-06-26 ENCOUNTER — Telehealth: Payer: Self-pay | Admitting: Internal Medicine

## 2015-06-26 NOTE — Telephone Encounter (Signed)
New message ° ° ° ° ° °Returning a nurses call to get echo results °

## 2015-06-26 NOTE — Telephone Encounter (Signed)
Pt has been notified of myoview results.

## 2015-06-29 ENCOUNTER — Telehealth: Payer: Self-pay | Admitting: Internal Medicine

## 2015-06-29 NOTE — Telephone Encounter (Signed)
I called the patient to schedule his S-ICD implant since his myoview is normal except for his EF. The patient does not want to be scheduled until 08/25/15. I advised him I would schedule him for a 1st case that day. He states he will talk with his fiance to confirm.  He also is asking for assistance from [name] with a letter to go to his case manager for his child support. 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  He states he is physically unable to work all 3 jobs and is having to quit the 2 part time jobs. He needs a letter supporting his medical condition as being the reason why he cannot work all 3 jobs especially since the [employer] job requires heavy lifting.  I advised him I would review with [name] and call him back about this. He is agreeable.

## 2015-07-05 NOTE — Telephone Encounter (Signed)
Phill, as his primary cardiologist, what are yoor thoughts about supporting his letter. I don't feel comfortable doing this with my limited interaction with him. Thanks

## 2015-07-06 ENCOUNTER — Encounter: Payer: Self-pay | Admitting: Nurse Practitioner

## 2015-07-06 ENCOUNTER — Telehealth: Payer: Self-pay | Admitting: Cardiovascular Disease

## 2015-07-06 NOTE — Telephone Encounter (Signed)
Letter has been composed and signed by Dr. Elease Hashimoto.  Left message for patient to call the office regarding letter.

## 2015-07-06 NOTE — Telephone Encounter (Signed)
Left message for patient to call back regarding letter; see previous telephone encounter

## 2015-07-06 NOTE — Telephone Encounter (Signed)
New Message: ° ° ° ° °Pt states he is returning call  °

## 2015-07-07 NOTE — Telephone Encounter (Signed)
Spoke with patient and advised him that letter has been composed and signed.  He requests that I place letter at front desk for him to pick up on Tuesday.  I read the letter to him and he verbalized agreement that it should meet the requirements for child support.  He thanked me for the call.

## 2015-07-26 ENCOUNTER — Telehealth: Payer: Self-pay | Admitting: Internal Medicine

## 2015-07-26 NOTE — Telephone Encounter (Signed)
New message      Pt cannot afford entresto.  Pt want [place] to get in touch with the ins company to lower the copay.  His copay now is 50.00 per month with ins.

## 2015-07-27 ENCOUNTER — Other Ambulatory Visit: Payer: Self-pay

## 2015-07-27 ENCOUNTER — Other Ambulatory Visit: Payer: Self-pay | Admitting: Cardiovascular Disease

## 2015-07-27 DIAGNOSIS — Z8673 Personal history of transient ischemic attack (TIA), and cerebral infarction without residual deficits: Secondary | ICD-10-CM

## 2015-07-27 DIAGNOSIS — I5022 Chronic systolic (congestive) heart failure: Secondary | ICD-10-CM

## 2015-07-27 MED ORDER — ATORVASTATIN CALCIUM 10 MG PO TABS: 10.0000 mg | ORAL_TABLET | Freq: Every day | ORAL | Status: DC

## 2015-07-27 MED ORDER — SPIRONOLACTONE 25 MG PO TABS
25.0000 mg | ORAL_TABLET | Freq: Every day | ORAL | Status: DC
Start: 2015-07-27 — End: 2016-09-09

## 2015-07-27 MED ORDER — SACUBITRIL-VALSARTAN 49-51 MG PO TABS: 1.0000 | ORAL_TABLET | Freq: Two times a day (BID) | ORAL | Status: DC

## 2015-07-27 NOTE — Telephone Encounter (Signed)
Spoke with patient. Gave him the number of [employer] [phone]

## 2015-08-04 ENCOUNTER — Telehealth: Payer: Self-pay | Admitting: Internal Medicine

## 2015-08-04 NOTE — Telephone Encounter (Signed)
I called the patient today in attempts to follow up with him and see if he is still ok with his S-ICD implant on 2/17. Also wanted to inquire if he still has the same insurance policy as 2016 as we will need to send his pending procedure back through pre-cert. The patient did answer the phone, but immediately stated he wanted to talk to me about that, but would need to call me back. I advised this was fine and then he hung up. Will await a call back from the patient.

## 2015-08-08 NOTE — Telephone Encounter (Signed)
I left a message for the patient to call to confirm S-ICD implant for 2/17.

## 2015-08-11 ENCOUNTER — Telehealth: Payer: Self-pay | Admitting: Internal Medicine

## 2015-08-11 NOTE — Telephone Encounter (Signed)
LMTCB//sss 

## 2015-08-11 NOTE — Telephone Encounter (Signed)
Spoke with patient who states he would like to make an appt to visit with Graciela Husbands. Explains that his girlfriend would like more information about SICD and difference between that and a traditional ICD. Offered to call and discuss this with her, but he refused and stated that they would prefer to come into the office to speak with [name]. Spoke with Dr. Odessa Fleming nurse, Herbert Seta, and arranged for patient be seen next Friday, 2/10. Patient is agreeable to plan.

## 2015-08-11 NOTE — Telephone Encounter (Signed)
New Message  Pt requested to speak w/ RN- wanted to cancel implant for 2/17 and speak w/ RN more about surgery. Please call back and discuss.

## 2015-08-18 ENCOUNTER — Ambulatory Visit: Payer: Commercial Managed Care - HMO | Admitting: Internal Medicine

## 2015-08-21 ENCOUNTER — Encounter: Payer: Self-pay | Admitting: Internal Medicine

## 2015-08-25 ENCOUNTER — Ambulatory Visit (HOSPITAL_COMMUNITY): Admit: 2015-08-25 | Payer: Commercial Managed Care - HMO | Admitting: Internal Medicine

## 2015-08-25 ENCOUNTER — Encounter (HOSPITAL_COMMUNITY): Payer: Self-pay

## 2015-08-25 SURGERY — SUBQ ICD IMPLANT
Anesthesia: Monitor Anesthesia Care

## 2015-10-03 ENCOUNTER — Telehealth: Payer: Self-pay | Admitting: Cardiovascular Disease

## 2015-10-03 NOTE — Telephone Encounter (Signed)
New Message:  Pt is calling in to speak with Marcelino Duster about the number he an call to get financial assistance for [facility]. Please f/u with him

## 2015-10-03 NOTE — Telephone Encounter (Signed)
Spoke with patient and gave him number from [employer] on 07/26/15: Spoke with patient. Gave him the number of [employer] [phone] I advised that I am placing 1 box of samples at the front desk for the patient.  He verbalized understanding and appreciation.

## 2015-11-30 ENCOUNTER — Encounter (HOSPITAL_BASED_OUTPATIENT_CLINIC_OR_DEPARTMENT_OTHER): Payer: Self-pay

## 2015-11-30 ENCOUNTER — Emergency Department (HOSPITAL_BASED_OUTPATIENT_CLINIC_OR_DEPARTMENT_OTHER)
Admission: EM | Admit: 2015-11-30 | Discharge: 2015-12-01 | Disposition: A | Discharge status: Home / Self Care | Payer: Commercial Managed Care - HMO | Attending: Emergency Medicine | Admitting: Emergency Medicine

## 2015-11-30 DIAGNOSIS — N492 Inflammatory disorders of scrotum: Secondary | ICD-10-CM | POA: Insufficient documentation

## 2015-11-30 DIAGNOSIS — L0291 Cutaneous abscess, unspecified: Secondary | ICD-10-CM

## 2015-11-30 DIAGNOSIS — I509 Heart failure, unspecified: Secondary | ICD-10-CM | POA: Diagnosis not present

## 2015-11-30 DIAGNOSIS — Z8673 Personal history of transient ischemic attack (TIA), and cerebral infarction without residual deficits: Secondary | ICD-10-CM | POA: Insufficient documentation

## 2015-11-30 MED ORDER — CEPHALEXIN 500 MG PO CAPS: 500.0000 mg | ORAL_CAPSULE | Freq: Two times a day (BID) | ORAL | Status: DC

## 2015-11-30 MED ORDER — DOXYCYCLINE HYCLATE 100 MG PO TABS
100.0000 mg | ORAL_TABLET | Freq: Once | ORAL | Status: AC
  Administered 2015-11-30: 100 mg via ORAL
  Filled 2015-11-30: qty 1

## 2015-11-30 MED ORDER — DOXYCYCLINE HYCLATE 100 MG PO TABS: 100.0000 mg | ORAL_TABLET | Freq: Two times a day (BID) | ORAL | Status: DC

## 2015-11-30 MED ORDER — CEPHALEXIN 250 MG PO CAPS
500.0000 mg | ORAL_CAPSULE | Freq: Once | ORAL | Status: AC
  Administered 2015-11-30: 500 mg via ORAL
  Filled 2015-11-30: qty 2

## 2015-11-30 NOTE — ED Provider Notes (Signed)
CSN: [id number]     Arrival date & time 11/30/15  2230 History  By signing my name below, I, Phillis Haggis, attest that this documentation has been prepared under the direction and in the presence of Tomasita Crumble, MD. Electronically Signed: Phillis Haggis, ED Scribe. 11/30/2015. 11:28 PM.  Chief Complaint  Patient presents with  . Abscess   The history is provided by the patient. No language interpreter was used.  HPI Comments: Nathan Baird is a 35 y.o. male with a hx of nonischemic cardiomyopathy, CVA, and CHF who presents to the Emergency Department complaining of an abscess to the scrotum onset 3 days ago. Pt reports that it came to a head and he was able to drain some purulence out of it. He has had similar symptoms one time before. He has not taken anything for his symptoms. He denies fever, chills, penile discharge, or dysuria.   Past Medical History  Diagnosis Date  . Cough     a. PFTS 09/2012: restriction probable, further examinations recommended. Saw pulm 11/2012: placed on GERD rx and short course prednisone, planned to regroup.  [name] Nonischemic cardiomyopathy (HCC)     a. Identified 10/2012 by echo EF 20-25%. b. Echo 01/2013: EF 20-25%, TEE EF 15% with severe RV dysfunction as well.  [name] History of palpitations     2x/month for several years feels heart racing  . CVA (cerebral vascular accident) (HCC) 01/23/2013    tPA administered; MRI brain showed small acute R MCA infarct  . PFO (patent foramen ovale)     a. By TEE 01/2013.  [name] CHF (congestive heart failure) [facility])    Past Surgical History  Procedure Laterality Date  . No past surgeries    . Tee without cardioversion N/A 01/26/2013    Procedure: TRANSESOPHAGEAL ECHOCARDIOGRAM (TEE);  Surgeon: [name], MD;  Location: [facility] ENDOSCOPY;  Service: Cardiovascular;  Laterality: N/A;   Family History  Problem Relation Age of Onset  . Sarcoidosis Mother   . Heart Problems Mother     PPM implantation age 80   Social History   Substance Use Topics  . Smoking status: Never Smoker   . Smokeless tobacco: None  . Alcohol Use: No    Review of Systems 10 Systems reviewed and all are negative for acute change except as noted in the HPI.  Allergies  Review of patient's allergies indicates no known allergies.  Home Medications   Prior to Admission medications   Medication Sig Start Date End Date Taking? Authorizing Provider  atorvastatin (LIPITOR) 10 MG tablet Take 1 tablet (10 mg total) by mouth daily at 6 PM. 07/27/15   [name], MD  carvedilol (COREG) 25 MG tablet take 1 tablet by mouth twice a day 07/27/15   [name], MD  diclofenac (VOLTAREN) 75 MG EC tablet Take 1 tablet (75 mg total) by mouth 2 (two) times daily. Patient not taking: Reported on 05/24/2015 11/23/14   Lenda Kelp, MD  sacubitril-valsartan (ENTRESTO) 49-51 MG Take 1 tablet by mouth 2 (two) times daily. 07/27/15   [name], MD  spironolactone (ALDACTONE) 25 MG tablet Take 1 tablet (25 mg total) by mouth daily. 07/27/15   [name], MD  XARELTO 20 MG TABS tablet Take 20 mg by mouth daily with supper.  01/10/15   Historical Provider, MD   BP 119/81 mmHg  Pulse 77  Temp(Src) 99 F (37.2 C) (Oral)  Resp 18  Ht  (1.88 m)  Wt 265 lb (120.203 kg)  BMI 34.01 kg/m2  SpO2 98% Physical Exam  Constitutional: He is oriented to person, place, and time. Vital signs are normal. He appears well-developed and well-nourished.  Non-toxic appearance. He does not appear ill. No distress.  HENT:  Head: Normocephalic and atraumatic.  Nose: Nose normal.  Mouth/Throat: Oropharynx is clear and moist. No oropharyngeal exudate.  Eyes: Conjunctivae and EOM are normal. Pupils are equal, round, and reactive to light. No scleral icterus.  Neck: Normal range of motion. Neck supple. No tracheal deviation, no edema, no erythema and normal range of motion present. No thyroid mass and no thyromegaly present.  Cardiovascular: Normal rate,  regular rhythm, S1 normal, S2 normal, normal heart sounds, intact distal pulses and normal pulses.  Exam reveals no gallop and no friction rub.   No murmur heard. Pulmonary/Chest: Effort normal and breath sounds normal. No respiratory distress. He has no wheezes. He has no rhonchi. He has no rales.  Abdominal: Soft. Normal appearance and bowel sounds are normal. He exhibits no distension, no ascites and no mass. There is no hepatosplenomegaly. There is no tenderness. There is no rebound, no guarding and no CVA tenderness.  Genitourinary:  Small, superficial 1 cm abscess to the left scrotum, coming to a head. No surrounding erythema or drainage  Musculoskeletal: Normal range of motion. He exhibits no edema or tenderness.  Lymphadenopathy:    He has no cervical adenopathy.  Neurological: He is alert and oriented to person, place, and time. He has normal strength. No cranial nerve deficit or sensory deficit.  Skin: Skin is warm, dry and intact. No petechiae and no rash noted. He is not diaphoretic. No erythema. No pallor.  Psychiatric: He has a normal mood and affect. His behavior is normal. Judgment normal.  Nursing note and vitals reviewed.   ED Course  Procedures (including critical care time) DIAGNOSTIC STUDIES: Oxygen Saturation is 98% on RA, normal by my interpretation.    COORDINATION OF CARE: 11:28 PM-Discussed treatment plan which includes antibiotics with pt at bedside and pt agreed to plan.    Labs Review Labs Reviewed - No data to display  Imaging Review No results found. I have personally reviewed and evaluated these images and lab results as part of my medical decision-making.   EKG Interpretation None      MDM   Final diagnoses:  None    Patient presents to the ED for abscess on scrotum. Area does appear to have previously drained. I encouraged further I&D in the ED but patient would rather manage at home and try antibiotics.  Return precautions given.  He  appears well and in NAD.  VS remain within his normal limits and he is safe for DC.  I personally performed the services described in this documentation, which was scribed in my presence. The recorded information has been reviewed and is accurate.      Tomasita Crumble, MD 11/30/15 2354

## 2015-11-30 NOTE — Discharge Instructions (Signed)
Abscess Nathan Baird, take antibiotics as directed.  Apply heating pads to the area 3 times per day for 15 min each to help the area drain.  If you have worsening pain, redness, or swelling, you need to go back to the ER to have the area drained.  Thank you. An abscess (boil or furuncle) is an infected area on or under the skin. This area is filled with yellowish-white fluid (pus) and other material (debris). HOME CARE   Only take medicines as told by your doctor.  If you were given antibiotic medicine, take it as directed. Finish the medicine even if you start to feel better.  If gauze is used, follow your doctor's directions for changing the gauze.  To avoid spreading the infection:  Keep your abscess covered with a bandage.  Wash your hands well.  Do not share personal care items, towels, or whirlpools with others.  Avoid skin contact with others.  Keep your skin and clothes clean around the abscess.  Keep all doctor visits as told. GET HELP RIGHT AWAY IF:   You have more pain, puffiness (swelling), or redness in the wound site.  You have more fluid or blood coming from the wound site.  You have muscle aches, chills, or you feel sick.  You have a fever. MAKE SURE YOU:   Understand these instructions.  Will watch your condition.  Will get help right away if you are not doing well or get worse.   This information is not intended to replace advice given to you by your health care provider. Make sure you discuss any questions you have with your health care provider.   Document Released: 12/11/2007 Document Revised: 12/24/2011 Document Reviewed: 09/07/2011 Elsevier Interactive Patient Education [employer]

## 2015-11-30 NOTE — ED Notes (Signed)
C/o "boil" to scrotum x 2-3 days-NAD-took phone call during triage-steady gait

## 2016-01-16 ENCOUNTER — Encounter (HOSPITAL_BASED_OUTPATIENT_CLINIC_OR_DEPARTMENT_OTHER): Payer: Self-pay | Admitting: Emergency Medicine

## 2016-01-16 ENCOUNTER — Emergency Department (HOSPITAL_BASED_OUTPATIENT_CLINIC_OR_DEPARTMENT_OTHER)
Admission: EM | Admit: 2016-01-16 | Discharge: 2016-01-16 | Disposition: A | Discharge status: Home / Self Care | Payer: Commercial Managed Care - HMO | Attending: Emergency Medicine | Admitting: Emergency Medicine

## 2016-01-16 DIAGNOSIS — Z202 Contact with and (suspected) exposure to infections with a predominantly sexual mode of transmission: Secondary | ICD-10-CM | POA: Diagnosis not present

## 2016-01-16 DIAGNOSIS — I509 Heart failure, unspecified: Secondary | ICD-10-CM | POA: Insufficient documentation

## 2016-01-16 DIAGNOSIS — J029 Acute pharyngitis, unspecified: Secondary | ICD-10-CM | POA: Diagnosis present

## 2016-01-16 DIAGNOSIS — J069 Acute upper respiratory infection, unspecified: Secondary | ICD-10-CM | POA: Insufficient documentation

## 2016-01-16 DIAGNOSIS — H9203 Otalgia, bilateral: Secondary | ICD-10-CM | POA: Diagnosis not present

## 2016-01-16 LAB — URINALYSIS, ROUTINE W REFLEX MICROSCOPIC
BILIRUBIN URINE: NEGATIVE
Glucose, UA: NEGATIVE mg/dL
Hgb urine dipstick: NEGATIVE
Ketones, ur: NEGATIVE mg/dL
NITRITE: NEGATIVE
PH: 6 (ref 5.0–8.0)
Protein, ur: NEGATIVE mg/dL
SPECIFIC GRAVITY, URINE: 1.011 (ref 1.005–1.030)

## 2016-01-16 LAB — URINE MICROSCOPIC-ADD ON: Bacteria, UA: NONE SEEN

## 2016-01-16 LAB — RAPID STREP SCREEN (MED CTR MEBANE ONLY): Streptococcus, Group A Screen (Direct): NEGATIVE

## 2016-01-16 MED ORDER — LIDOCAINE HCL (PF) 1 % IJ SOLN
INTRAMUSCULAR | Status: AC
  Administered 2016-01-16: 1.2 mL
  Filled 2016-01-16: qty 5

## 2016-01-16 MED ORDER — AZITHROMYCIN 250 MG PO TABS
1000.0000 mg | ORAL_TABLET | Freq: Once | ORAL | Status: AC
  Administered 2016-01-16: 1000 mg via ORAL
  Filled 2016-01-16: qty 4

## 2016-01-16 MED ORDER — BENZONATATE 100 MG PO CAPS: 100.0000 mg | ORAL_CAPSULE | Freq: Three times a day (TID) | ORAL | Status: DC

## 2016-01-16 MED ORDER — CEFTRIAXONE SODIUM 250 MG IJ SOLR
250.0000 mg | Freq: Once | INTRAMUSCULAR | Status: AC
  Administered 2016-01-16: 250 mg via INTRAMUSCULAR
  Filled 2016-01-16: qty 250

## 2016-01-16 MED ORDER — BENZONATATE 100 MG PO CAPS
100.0000 mg | ORAL_CAPSULE | Freq: Once | ORAL | Status: AC
  Administered 2016-01-16: 100 mg via ORAL
  Filled 2016-01-16: qty 1

## 2016-01-16 NOTE — ED Notes (Signed)
Pt c/o sore throat x 3 days. Pt also states he was exposed to STD.

## 2016-01-16 NOTE — ED Provider Notes (Signed)
CSN: [id number]     Arrival date & time 01/16/16  2210 History  By signing my name below, I, Rosario Adie, attest that this documentation has been prepared under the direction and in the presence of Melene Plan, DO.  Electronically Signed: Rosario Adie, ED Scribe. 01/16/2016. 10:44 PM.   Chief Complaint  Patient presents with  . SEXUALLY TRANSMITTED DISEASE  . Sore Throat   Patient is a 35 y.o. male presenting with pharyngitis. The history is provided by the patient.  Sore Throat This is a new problem. The current episode started more than 2 days ago. The problem occurs constantly. The problem has been gradually worsening. Pertinent negatives include no chest pain, no abdominal pain, no headaches and no shortness of breath. The symptoms are aggravated by swallowing. Nothing relieves the symptoms. He has tried nothing for the symptoms.   HPI Comments: GIANLUCAS EVENSON is a 35 y.o. male who presents to the Emergency Department complaining of gradual onset, gradually worsening, constant sore throat onset 3 days ago. His pain is worsened with swallowing. No OTC medications or home remedies tried PTA. He has associated bialteral ear pain, and cough. Pt is also concerned that he might have been exposed to an STD recently.  Pt denies fevers, rhinorrhea, nasal congestion, dysuria, penile discharge, penile pain, or rash of any kind.  Past Medical History  Diagnosis Date  . Cough     a. PFTS 09/2012: restriction probable, further examinations recommended. Saw pulm 11/2012: placed on GERD rx and short course prednisone, planned to regroup.  [name] Nonischemic cardiomyopathy (HCC)     a. Identified 10/2012 by echo EF 20-25%. b. Echo 01/2013: EF 20-25%, TEE EF 15% with severe RV dysfunction as well.  [name] History of palpitations     2x/month for several years feels heart racing  . CVA (cerebral vascular accident) (HCC) 01/23/2013    tPA administered; MRI brain showed small acute R MCA infarct  . PFO  (patent foramen ovale)     a. By TEE 01/2013.  [name] CHF (congestive heart failure) [facility])    Past Surgical History  Procedure Laterality Date  . No past surgeries    . Tee without cardioversion N/A 01/26/2013    Procedure: TRANSESOPHAGEAL ECHOCARDIOGRAM (TEE);  Surgeon: [name], MD;  Location: [facility] ENDOSCOPY;  Service: Cardiovascular;  Laterality: N/A;   Family History  Problem Relation Age of Onset  . Sarcoidosis Mother   . Heart Problems Mother     PPM implantation age 77   Social History  Substance Use Topics  . Smoking status: Never Smoker   . Smokeless tobacco: None  . Alcohol Use: No    Review of Systems  Constitutional: Negative for fever and chills.  HENT: Positive for ear pain and sore throat. Negative for congestion, facial swelling and rhinorrhea.   Eyes: Negative for discharge and visual disturbance.  Respiratory: Positive for cough. Negative for shortness of breath.   Cardiovascular: Negative for chest pain and palpitations.  Gastrointestinal: Negative for vomiting, abdominal pain and diarrhea.  Genitourinary: Negative for dysuria, discharge and penile pain.  Musculoskeletal: Negative for myalgias and arthralgias.  Skin: Negative for color change and rash.  Neurological: Negative for tremors, syncope and headaches.  Psychiatric/Behavioral: Negative for confusion and dysphoric mood.   Allergies  Review of patient's allergies indicates no known allergies.  Home Medications   Prior to Admission medications   Medication Sig Start Date End Date Taking? Authorizing Provider  atorvastatin (LIPITOR) 10 MG  tablet Take 1 tablet (10 mg total) by mouth daily at 6 PM. 07/27/15   [name], MD  benzonatate (TESSALON) 100 MG capsule Take 1 capsule (100 mg total) by mouth every 8 (eight) hours. 01/16/16   Melene Plan, DO  carvedilol (COREG) 25 MG tablet take 1 tablet by mouth twice a day 07/27/15   [name], MD  cephALEXin (KEFLEX) 500 MG capsule Take 1 capsule  (500 mg total) by mouth 2 (two) times daily. 11/30/15   Tomasita Crumble, MD  diclofenac (VOLTAREN) 75 MG EC tablet Take 1 tablet (75 mg total) by mouth 2 (two) times daily. Patient not taking: Reported on 05/24/2015 11/23/14   Lenda Kelp, MD  doxycycline (VIBRA-TABS) 100 MG tablet Take 1 tablet (100 mg total) by mouth 2 (two) times daily. 11/30/15   Tomasita Crumble, MD  sacubitril-valsartan (ENTRESTO) 49-51 MG Take 1 tablet by mouth 2 (two) times daily. 07/27/15   [name], MD  spironolactone (ALDACTONE) 25 MG tablet Take 1 tablet (25 mg total) by mouth daily. 07/27/15   [name], MD  XARELTO 20 MG TABS tablet Take 20 mg by mouth daily with supper.  01/10/15   Historical Provider, MD   BP 115/84 mmHg  Pulse 84  Temp(Src) 98.3 F (36.8 C) (Oral)  Resp 16  Ht  (1.905 m)  Wt 265 lb (120.203 kg)  BMI 33.12 kg/m2  SpO2 99%   Physical Exam  Constitutional: He is oriented to person, place, and time. He appears well-developed and well-nourished. No distress.  HENT:  Head: Normocephalic and atraumatic.  Pt has swollen turbinates, and posterior nasal drip. TMs normal bilaterally.   Eyes: EOM are normal. Pupils are equal, round, and reactive to light.  Neck: Normal range of motion.  Cardiovascular: Normal rate, regular rhythm and normal heart sounds.  Exam reveals no gallop and no friction rub.   No murmur heard. Pulmonary/Chest: Effort normal and breath sounds normal. No respiratory distress. He has no wheezes. He has no rales.  Lungs CTA bilaterally.  Abdominal: He exhibits no distension.  Musculoskeletal: Normal range of motion.  Neurological: He is alert and oriented to person, place, and time.  Skin: Skin is warm and dry. No rash noted. He is not diaphoretic. No pallor.  Psychiatric: He has a normal mood and affect. His behavior is normal.  Nursing note and vitals reviewed.  ED Course  Procedures (including critical care time)  DIAGNOSTIC STUDIES: Oxygen Saturation is 99%  on RA, normal by my interpretation.   COORDINATION OF CARE: 10:44 PM-Discussed next steps with pt. Pt verbalized understanding and is agreeable with the plan.   Labs Review Labs Reviewed  URINALYSIS, ROUTINE W REFLEX MICROSCOPIC (NOT AT [facility]) - Abnormal; Notable for the following:    Leukocytes, UA SMALL (*)    All other components within normal limits  URINE MICROSCOPIC-ADD ON - Abnormal; Notable for the following:    Squamous Epithelial / LPF 0-5 (*)    All other components within normal limits  RAPID STREP SCREEN (NOT AT [facility])  CULTURE, GROUP A STREP ([facility])  GC/CHLAMYDIA PROBE AMP ([place]) NOT AT [facility]   I have personally reviewed and evaluated these lab results as part of my medical decision-making.  MDM   Final diagnoses:  STD exposure  URI, acute    35 yo M with STD exposure.  Also with URI.  Doubt pna.  Treat.  I personally performed the services described in this documentation, which  was scribed in my presence. The recorded information has been reviewed and is accurate.   11:40 PM:  I have discussed the diagnosis/risks/treatment options with the patient and family and believe the pt to be eligible for discharge home to follow-up with PCP. We also discussed returning to the ED immediately if new or worsening sx occur. We discussed the sx which are most concerning (e.g., sudden worsening pain, fever, inability to tolerate by mouth) that necessitate immediate return. Medications administered to the patient during their visit and any new prescriptions provided to the patient are listed below.  Medications given during this visit Medications  cefTRIAXone (ROCEPHIN) injection 250 mg (250 mg Intramuscular Given 01/16/16 2255)  azithromycin (ZITHROMAX) tablet 1,000 mg (1,000 mg Oral Given 01/16/16 2258)  benzonatate (TESSALON) capsule 100 mg (100 mg Oral Given 01/16/16 2257)  lidocaine (PF) (XYLOCAINE) 1 % injection (1.2 mLs  Given 01/16/16 2255)    Discharge Medication List  as of 01/16/2016 10:49 PM    START taking these medications   Details  benzonatate (TESSALON) 100 MG capsule Take 1 capsule (100 mg total) by mouth every 8 (eight) hours., Starting 01/16/2016, Until Discontinued, Print        The patient appears reasonably screen and/or stabilized for discharge and I doubt any other medical condition or other [facility] requiring further screening, evaluation, or treatment in the ED at this time prior to discharge.     Melene Plan, DO 01/16/16 2340

## 2016-01-16 NOTE — Discharge Instructions (Signed)

## 2016-01-16 NOTE — ED Notes (Signed)
MD at bedside. 

## 2016-01-17 LAB — GC/CHLAMYDIA PROBE AMP (~~LOC~~) NOT AT ARMC
CHLAMYDIA, DNA PROBE: NEGATIVE
Neisseria Gonorrhea: NEGATIVE

## 2016-01-19 LAB — CULTURE, GROUP A STREP (THRC)

## 2016-01-24 ENCOUNTER — Telehealth: Payer: Self-pay | Admitting: *Deleted

## 2016-01-28 IMAGING — CR DG HAND COMPLETE 3+V*R*
3 series · 3 of 3 positions shown · non-contrast
Comparison: None.

CLINICAL DATA: Patient hit right hand with hammer. Pain and
swelling mainly in region of middle and ring fingers. Initial
encounter.

EXAM:
RIGHT HAND - COMPLETE 3+ VIEW

[view not recorded (1 of 3)]
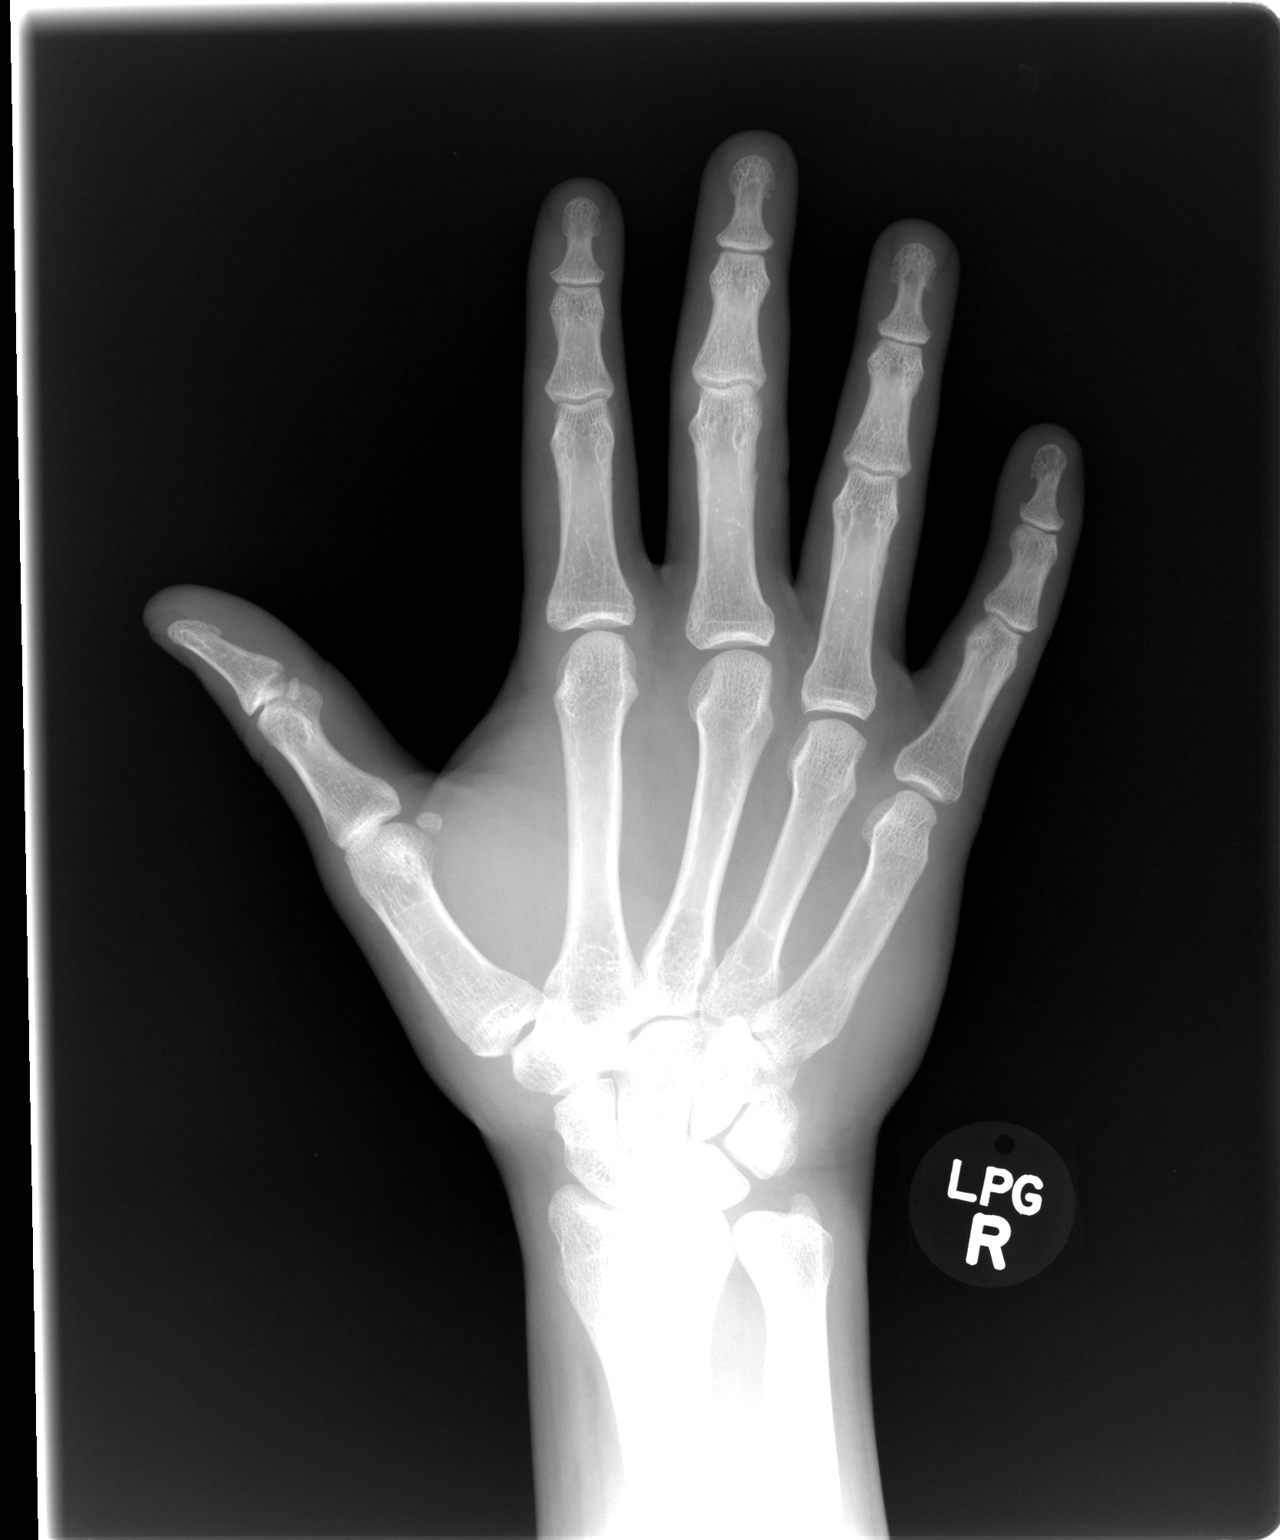

[view not recorded (2 of 3)]
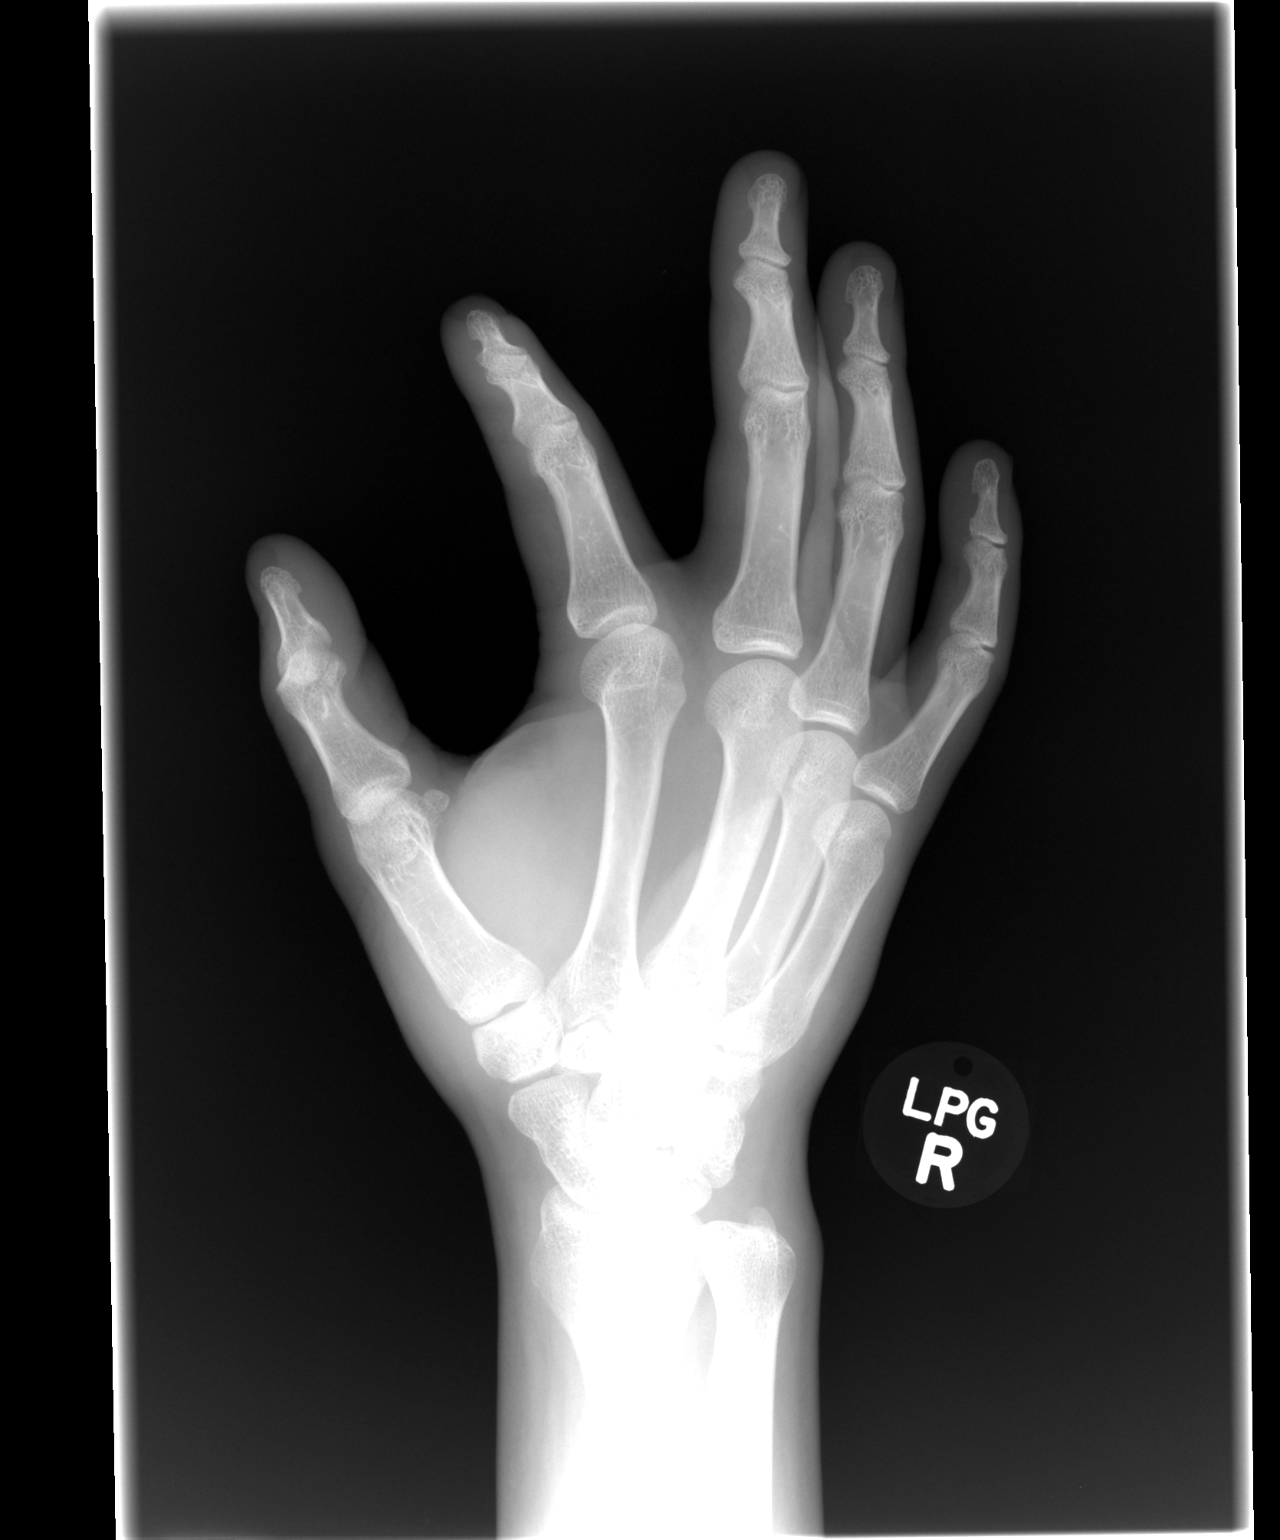

[view not recorded (3 of 3)]
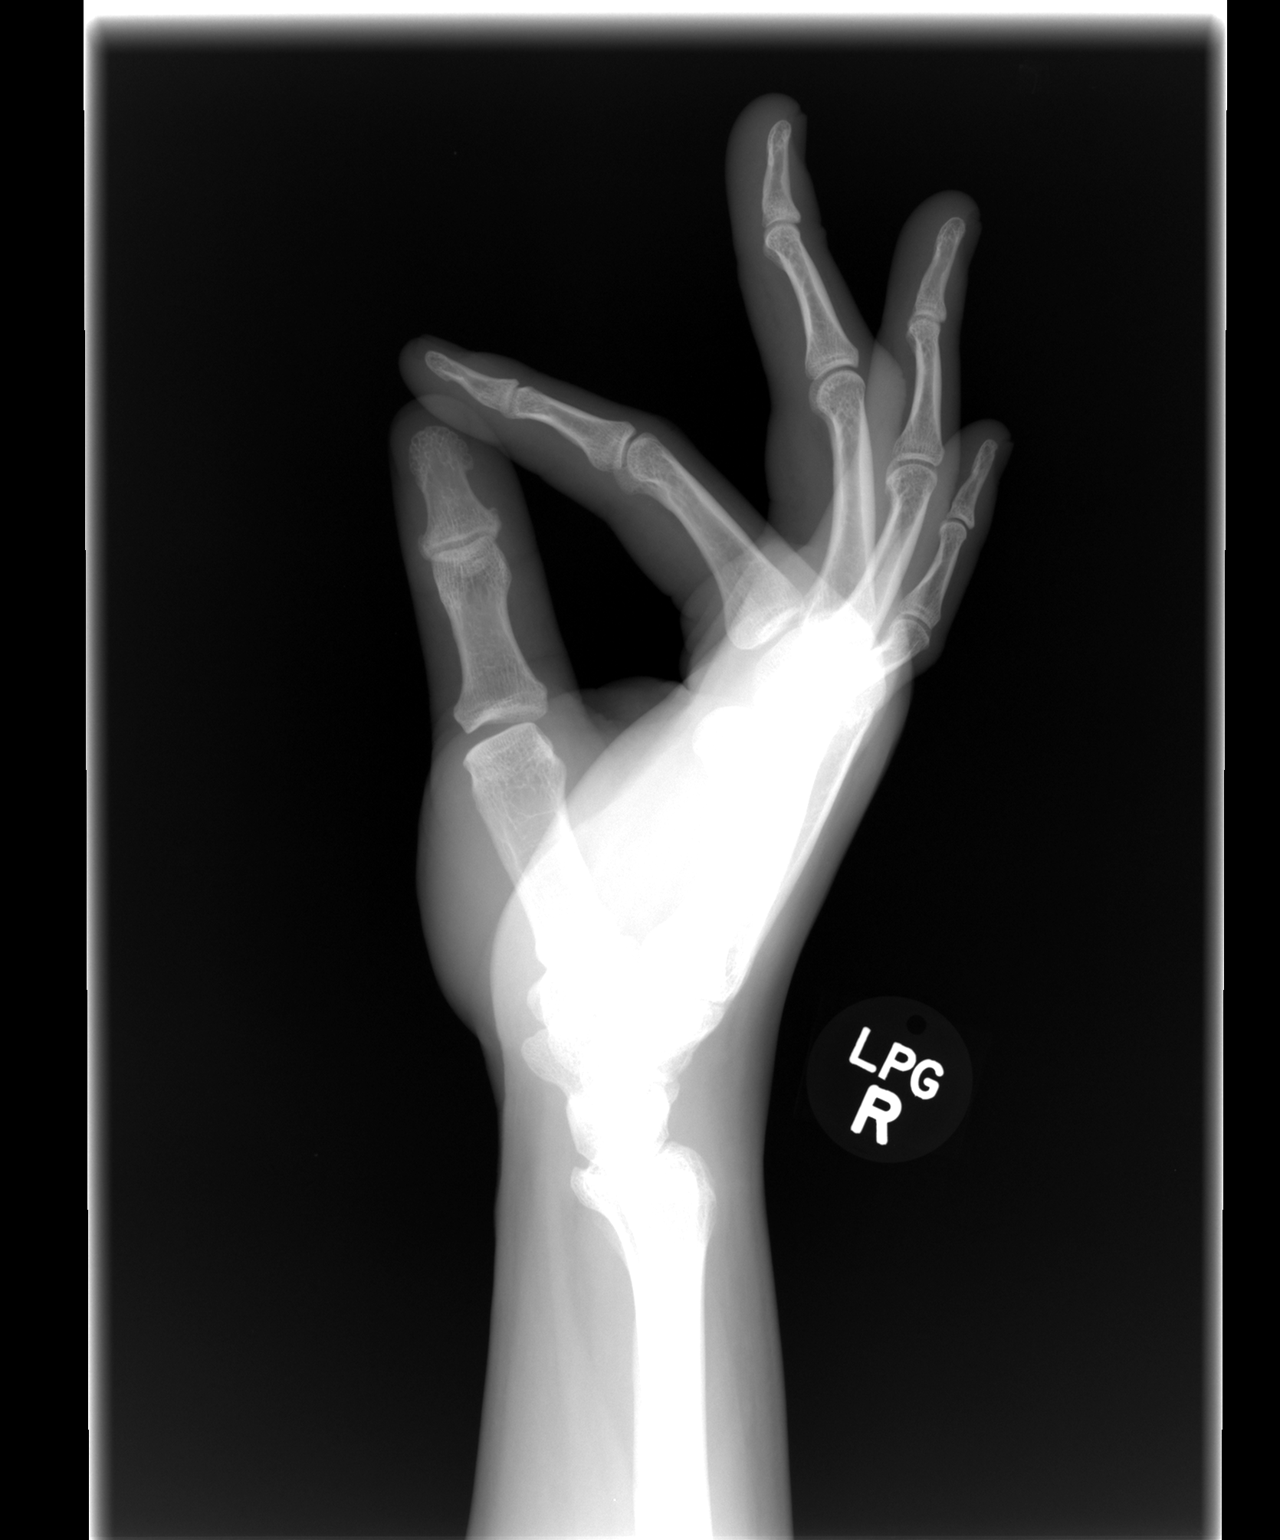

[3 of 3 positions shown; findings below may reference images not displayed]

FINDINGS: There is no evidence of fracture or dislocation. There is no
evidence of arthropathy or other focal bone abnormality. Soft
tissues are unremarkable.
IMPRESSION: Negative.

## 2016-02-14 ENCOUNTER — Telehealth: Payer: Self-pay | Admitting: Cardiovascular Disease

## 2016-02-14 NOTE — Telephone Encounter (Signed)
F/u    Pt returning nurse call about samples. Please call.

## 2016-02-14 NOTE — Telephone Encounter (Signed)
New message       Patient calling the office for samples of medication:   1.  What medication and dosage are you requesting samples for? entresto  2.  Are you currently out of this medication? Almost out

## 2016-02-14 NOTE — Telephone Encounter (Signed)
Checked with [employer].  She does have one box of samples and will put them at the front desk for pt to pick up.  He is aware.

## 2016-03-05 NOTE — Progress Notes (Deleted)
Cardiology Office Note:    Date:  03/05/2016   ID:  Nathan Simonsony J Baird, DOB 05-02-1981, MRN [id number]  PCP:  No PCP Per Patient  Cardiologist:  Dr. Delane GingerPhil Nahser   Electrophysiologist:  Dr. Sherryl MangesSteven Klein   Referring MD: No ref. provider found   No chief complaint on file. Fatigue  History of Present Illness:    Nathan Simonsony J Baird is a 35 y.o. male with a hx of chronic systolic CHF, NICM, prior CVA, restrictive lung disease.  ***  Patient had a R MCA stroke in 2014 tx with tPA.  Infarct was felt to be embolic in the setting of low EF.  Evidence of stasis noted on TEE.  He was placed on long term anticoagulation with Xarelto.  ***  Prior CV studies that were reviewed today include:    Myoview 12/16 The left ventricular ejection fraction is moderately decreased (30-44%). Nuclear stress EF: 34%. This is a high risk study. There is no evidence of ischemia. The left ventricle is dilated with EF 34% and global hypokinesis.The study is considered high risk because of EF < 35%.  Echo 4/16 - Left ventricle: The cavity size was moderately dilated. Wall thickness was normal. Systolic function was severely reduced. The estimated ejection fraction was in the range of 25% to 30%. Diffuse hypokinesis. Doppler parameters are consistent with abnormal left ventricular relaxation (grade 1 diastolic dysfunction). - Aortic root: The aortic root was mildly dilated. - Right ventricle: Systolic function was mildly reduced.  Impressions:  - Severe global reduction in LV function; grade 1 diastolic dysfunction; trace MR and TR.  Carotid US 11/15 Findings consistent with 1-39 percent stenosis involving the right internal carotid artery and the left internal carotid artery.  MRI 8/14 1)    Severe LVE with diffuse hypokinesis EF 26%       2)    Findings consistent with ventricular non-compaction 3)    No infiltration or scar in LV myocardium 4)    Mild RV enlargement 5)    PFO 6)     No LAA thrombus 7)    Mild LAE  Past Medical History:  Diagnosis Date  . CHF (congestive heart failure) (HCC)   . Cough    a. PFTS 09/2012: restriction probable, further examinations recommended. Saw pulm 11/2012: placed on GERD rx and short course prednisone, planned to regroup.  . CVA (cerebral vascular accident) (HCC) 01/23/2013   tPA administered; MRI brain showed small acute R MCA infarct  . History of palpitations    2x/month for several years feels heart racing  . Nonischemic cardiomyopathy (HCC)    a. Identified 10/2012 by echo EF 20-25%. b. Echo 01/2013: EF 20-25%, TEE EF 15% with severe RV dysfunction as well.  [name]. PFO (patent foramen ovale)    a. By TEE 01/2013.    Past Surgical History:  Procedure Laterality Date  . NO PAST SURGERIES    . TEE WITHOUT CARDIOVERSION N/A 01/26/2013   Procedure: TRANSESOPHAGEAL ECHOCARDIOGRAM (TEE);  Surgeon: Lewayne BuntingBrian S Crenshaw, MD;  [facility] END[facility];  Service: Cardiovascular;  Laterality: N/A;    Current Medications: Outpatient Medications Prior to Visit  Medication Sig Dispense Refill  . atorvastatin (LIPITOR) 10 MG tablet Take 1 tablet (10 mg total) by mouth daily at 6 PM. 31 tablet 11  . benzonatate (TESSALON) 100 MG capsule Take 1 capsule (100 mg total) by mouth every 8 (eight) hours. 21 capsule 0  . carvedilol (COREG) 25 MG tablet take 1 tablet by mouth twice  a day 180 tablet 2  . cephALEXin (KEFLEX) 500 MG capsule Take 1 capsule (500 mg total) by mouth 2 (two) times daily. 10 capsule 0  . diclofenac (VOLTAREN) 75 MG EC tablet Take 1 tablet (75 mg total) by mouth 2 (two) times daily. (Patient not taking: Reported on 05/24/2015) 60 tablet 1  . doxycycline (VIBRA-TABS) 100 MG tablet Take 1 tablet (100 mg total) by mouth 2 (two) times daily. 10 tablet 0  . sacubitril-valsartan (ENTRESTO) 49-51 MG Take 1 tablet by mouth 2 (two) times daily. 60 tablet 11  . spironolactone (ALDACTONE) 25 MG tablet Take 1 tablet (25 mg total) by mouth daily. 31  tablet 11  . XARELTO 20 MG TABS tablet Take 20 mg by mouth daily with supper.   0   No facility-administered medications prior to visit.       Allergies:   Review of patient's allergies indicates no known allergies.   Social History   Social History  . Marital status: Single    Spouse name: N/A  . Number of children: N/A  . Years of education: N/A   Social History Main Topics  . Smoking status: Never Smoker  . Smokeless tobacco: Not on file  . Alcohol use No  . Drug use: No  . Sexual activity: Not on file   Other Topics Concern  . Not on file   Social History Narrative   Marital status: single     Children: 4 children (37, 10, 2, 23month old)      Lives: with girlfriend in [place].      Employment: works for city of [employer] exposure      Exercise: basketball every Friday night.     Family History:  The patient's ***family history includes Heart Problems in his mother; Sarcoidosis in his mother.   ROS:   Please see the history of present illness.    ROS All other systems reviewed and are negative.   EKGs/Labs/Other Test Reviewed:    EKG:  EKG is *** ordered today.  The ekg ordered today demonstrates ***  Recent Labs: 06/07/2015: BUN 14; Creat 1.04; Potassium 3.9; Sodium 140   Recent Lipid Panel    Component Value Date/Time   CHOL 162 05/20/2014 0026   TRIG 141 05/20/2014 0026   HDL 45 05/20/2014 0026   CHOLHDL 3.6 05/20/2014 0026   VLDL 28 05/20/2014 0026   LDLCALC 89 05/20/2014 0026     Physical Exam:    VS:  There were no vitals taken for this visit.    Wt Readings from Last 3 Encounters:  01/16/16 265 lb (120.2 kg)  11/30/15 265 lb (120.2 kg)  06/22/15 264 lb (119.7 kg)     ***Physical Exam  ASSESSMENT:    No diagnosis found. PLAN:    In order of problems listed above:  ***   Medication Adjustments/Labs and Tests Ordered: Current medicines are reviewed at length with the patient today.  Concerns regarding medicines are  outlined above.  Medication changes, Labs and Tests ordered today are outlined in the Patient Instructions noted below. There are no Patient Instructions on file for this visit. Signed, Tereso Newcomer, PA-C  03/05/2016 9:54 PM    [facility] [address] [place], [place], [place]  [address] Phone: [phone]; Fax: [phone]

## 2016-03-06 ENCOUNTER — Ambulatory Visit: Payer: Self-pay | Admitting: Physician Assistant

## 2016-03-13 ENCOUNTER — Encounter: Payer: Self-pay | Admitting: Physician Assistant

## 2016-04-25 ENCOUNTER — Ambulatory Visit (INDEPENDENT_AMBULATORY_CARE_PROVIDER_SITE_OTHER): Payer: Commercial Managed Care - HMO | Admitting: Physician Assistant

## 2016-04-25 VITALS — BP 110/76 | HR 88 | Temp 98.1°F | Resp 16 | Ht 74.0 in | Wt 269.0 lb

## 2016-04-25 DIAGNOSIS — R21 Rash and other nonspecific skin eruption: Secondary | ICD-10-CM

## 2016-04-25 LAB — POCT SKIN KOH: Skin KOH, POC: NEGATIVE

## 2016-04-25 MED ORDER — CLOTRIMAZOLE-BETAMETHASONE 1-0.05 % EX CREA: 1.0000 "application " | TOPICAL_CREAM | Freq: Two times a day (BID) | CUTANEOUS | 0 refills | Status: DC

## 2016-04-25 MED ORDER — TRIAMCINOLONE ACETONIDE 0.025 % EX OINT: 1.0000 "application " | TOPICAL_OINTMENT | Freq: Two times a day (BID) | CUTANEOUS | 0 refills | Status: DC

## 2016-04-25 NOTE — Patient Instructions (Addendum)
Use Lotrisone sparingly on scrotum.  Please return to clinic if your symptoms do not improve in 7-10 days.  I will contact you with your results.   IF you received an x-ray today, you will receive an invoice from [facility]. Please contact [facility] at [phone] with questions or concerns regarding your invoice.   IF you received labwork today, you will receive an invoice from [employer]. Please contact [facility] at [phone] with questions or concerns regarding your invoice.   Our billing staff will not be able to assist you with questions regarding bills from these companies.  You will be contacted with the lab results as soon as they are available. The fastest way to get your results is to activate your My Chart account. Instructions are located on the last page of this paperwork. If you have not heard from [place] regarding the results in 2 weeks, please contact this office.

## 2016-04-25 NOTE — Progress Notes (Signed)
Nathan Baird  MRN: [id number] DOB: December 26, 1980  PCP: No PCP Per Patient  Subjective:  Pt is a 35 year old male history of stroke, PFO, CVA and hyperlipidemia, who presents to clinic for rash on testicle x 3 weeks. He noticed it because he saw it.  Denies burning, itching, pain, drainage. Denies urinary symptoms, fever, chills, weight loss, joint pain.  No rash on palms or soles.  Last coitus 2 weeks ago, same partner. He is married. She does not have similar rash or associated symptoms symptoms.    Has not tried anything for it. Cannot recall the last time he has been tested for STI  Review of Systems  Constitutional: Negative for chills, diaphoresis, fatigue, fever and unexpected weight change.  HENT: Negative for sore throat and trouble swallowing.   Cardiovascular: Negative for chest pain and palpitations.  Gastrointestinal: Negative for abdominal pain, diarrhea, nausea and vomiting.  Genitourinary: Negative for discharge, genital sores, hematuria, penile pain, penile swelling, scrotal swelling and testicular pain.  Musculoskeletal: Negative for arthralgias and joint swelling.  Skin: Positive for color change and rash.  Neurological: Negative for weakness, light-headedness and numbness.    Patient Active Problem List   Diagnosis Date Noted  . Trapezius muscle strain 11/24/2014  . Right shoulder pain 08/24/2014  . TIA (transient ischemic attack) 05/20/2014  . History of CVA (cerebrovascular accident)   . Essential hypertension   . HLD (hyperlipidemia)   . Encounter for long-term (current) use of high-risk medication 01/27/2013  . Patent foramen ovale with right to left shunt 01/27/2013  . Stroke, acute, embolic (HCC) 01/25/2013  . Cough 10/20/2012  . Chronic systolic congestive heart failure (HCC) 10/20/2012  . Penile pain 05/19/2012  . ATTENTION DEFICIT, W/HYPERACTIVITY 09/04/2006    Current Outpatient Prescriptions on File Prior to Visit  Medication Sig Dispense Refill   . atorvastatin (LIPITOR) 10 MG tablet Take 1 tablet (10 mg total) by mouth daily at 6 PM. 31 tablet 11  . carvedilol (COREG) 25 MG tablet take 1 tablet by mouth twice a day 180 tablet 2  . sacubitril-valsartan (ENTRESTO) 49-51 MG Take 1 tablet by mouth 2 (two) times daily. 60 tablet 11  . spironolactone (ALDACTONE) 25 MG tablet Take 1 tablet (25 mg total) by mouth daily. 31 tablet 11   No current facility-administered medications on file prior to visit.     No Known Allergies  Objective:  BP 110/76 (BP Location: Right Arm, Patient Position: Sitting, Cuff Size: Large)   Pulse 88   Temp 98.1 F (36.7 C) (Oral)   Resp 16   Ht 6\' 2"  (1.88 m)   Wt 269 lb (122 kg)   SpO2 98%   BMI 34.54 kg/m   Physical Exam  Constitutional: He is oriented to person, place, and time and well-developed, well-nourished, and in no distress. No distress.  Eyes: Conjunctivae are normal. Pupils are equal, round, and reactive to light.  Cardiovascular: Normal rate, regular rhythm and normal heart sounds.   Pulmonary/Chest: Effort normal. No respiratory distress.  Genitourinary: Penis normal. He exhibits no abnormal testicular mass, no testicular tenderness and no scrotal tenderness. Penis exhibits no lesions and no edema. No discharge found.  Genitourinary Comments: Four well-circumscribed annual hyopigmented lesion on right scrotum. 1 cm diameter. Not TTP. No ulceration, drainage, erythema, vesicles, scaling, swelling noted.   Neurological: He is alert and oriented to person, place, and time. GCS score is 15.  Skin: Skin is warm and dry.  Psychiatric: Mood, memory, affect  and judgment normal.  Vitals reviewed.   Results for orders placed or performed in visit on 04/25/16  POCT Skin KOH  Result Value Ref Range   Skin KOH, POC Negative     Assessment and Plan :  1. Rash on scrotum - RPR - GC/Chlamydia Probe Amp - HIV antibody - POCT Skin KOH - Hemoglobin A1c - clotrimazole-betamethasone (LOTRISONE)  cream; Apply 1 application topically 2 (two) times daily.  Dispense: 30 g; Refill: 0 - Labs pending. Will contact with results. Patient advised to use the cream sparingly.  understands and agrees with plan. RTC in 7-10 days if no improvement. Consider biopsy at that time.   Marco CollieWhitney Camillia Marcy, PA-C  [facility] 04/25/2016 1:57 PM

## 2016-04-26 LAB — HEMOGLOBIN A1C
Hgb A1c MFr Bld: 5.6 % (ref <5.7)
Mean Plasma Glucose: 114 mg/dL

## 2016-04-26 LAB — GC/CHLAMYDIA PROBE AMP
CT Probe RNA: NOT DETECTED
GC Probe RNA: NOT DETECTED

## 2016-04-26 LAB — RPR

## 2016-04-26 LAB — HIV ANTIBODY (ROUTINE TESTING W REFLEX): HIV 1&2 Ab, 4th Generation: NONREACTIVE

## 2016-08-18 ENCOUNTER — Emergency Department (HOSPITAL_BASED_OUTPATIENT_CLINIC_OR_DEPARTMENT_OTHER)
Admission: EM | Admit: 2016-08-18 | Discharge: 2016-08-18 | Disposition: A | Discharge status: Home / Self Care | Payer: Commercial Managed Care - HMO | Attending: Emergency Medicine | Admitting: Emergency Medicine

## 2016-08-18 ENCOUNTER — Encounter (HOSPITAL_BASED_OUTPATIENT_CLINIC_OR_DEPARTMENT_OTHER): Payer: Self-pay | Admitting: Emergency Medicine

## 2016-08-18 DIAGNOSIS — Z202 Contact with and (suspected) exposure to infections with a predominantly sexual mode of transmission: Secondary | ICD-10-CM | POA: Diagnosis not present

## 2016-08-18 DIAGNOSIS — I110 Hypertensive heart disease with heart failure: Secondary | ICD-10-CM | POA: Diagnosis not present

## 2016-08-18 DIAGNOSIS — I509 Heart failure, unspecified: Secondary | ICD-10-CM | POA: Insufficient documentation

## 2016-08-18 DIAGNOSIS — Z79899 Other long term (current) drug therapy: Secondary | ICD-10-CM | POA: Insufficient documentation

## 2016-08-18 LAB — URINALYSIS, ROUTINE W REFLEX MICROSCOPIC
BILIRUBIN URINE: NEGATIVE
GLUCOSE, UA: NEGATIVE mg/dL
HGB URINE DIPSTICK: NEGATIVE
KETONES UR: NEGATIVE mg/dL
LEUKOCYTES UA: NEGATIVE
Nitrite: NEGATIVE
PH: 6.5 (ref 5.0–8.0)
Protein, ur: NEGATIVE mg/dL
Specific Gravity, Urine: 1.003 — ABNORMAL LOW (ref 1.005–1.030)

## 2016-08-18 MED ORDER — CEFTRIAXONE SODIUM 250 MG IJ SOLR
250.0000 mg | Freq: Once | INTRAMUSCULAR | Status: AC
  Administered 2016-08-18: 250 mg via INTRAMUSCULAR
  Filled 2016-08-18: qty 250

## 2016-08-18 MED ORDER — LIDOCAINE HCL (PF) 1 % IJ SOLN
INTRAMUSCULAR | Status: AC
  Administered 2016-08-18: 1 mL
  Filled 2016-08-18: qty 5

## 2016-08-18 MED ORDER — METRONIDAZOLE 500 MG PO TABS
2000.0000 mg | ORAL_TABLET | Freq: Once | ORAL | Status: AC
  Administered 2016-08-18: 2000 mg via ORAL
  Filled 2016-08-18: qty 4

## 2016-08-18 MED ORDER — AZITHROMYCIN 250 MG PO TABS
1000.0000 mg | ORAL_TABLET | Freq: Once | ORAL | Status: AC
  Administered 2016-08-18: 1000 mg via ORAL
  Filled 2016-08-18: qty 4

## 2016-08-18 NOTE — Discharge Instructions (Signed)
Your urine shows no signs of infection. Will treat you prophylactically for Trichomonas, gonorrhea, chlamydia. Urine gonorrhea and chlamydia cultures are pending will be notified in 24 hours if positive. Please inform all sexual partners if you treated for an STD. You can follow up with the health department for further STD testing and treatment. Return to ED if he develop any worsening symptoms.

## 2016-08-18 NOTE — ED Notes (Signed)
Pt given d/c instructions as per chart. Verbalizes understanding. No questions. 

## 2016-08-18 NOTE — ED Triage Notes (Signed)
Pt here today to get checked for trichomoniasis, due to former partner having this.  Pt has aching feeling in penis, without discharge.

## 2016-08-18 NOTE — ED Notes (Signed)
Patient denies pain and is resting comfortably.  

## 2016-08-18 NOTE — ED Provider Notes (Signed)
[facility] Provider Note   CSN: [id number] Arrival date & time: 08/18/16  1340  By signing my name below, I, Modena Jansky, attest that this documentation has been prepared under the direction and in the presence of non-physician practitioner, Azucena Kuba, PA-C. Electronically Signed: Modena Jansky, Scribe. 08/18/2016. 4:19 PM.  History   Chief Complaint Chief Complaint  Patient presents with  . SEXUALLY TRANSMITTED DISEASE   The history is provided by the patient. No language interpreter was used.   HPI Comments: Nathan Baird is a 36 y.o. male who presents to the Emergency Department for a STD check. His partner tested positive for trichomoniasis and wanted to be test for gonorrhea and chlamydia. He does not want testing for HIV or syphilis. He currently has associated throbbing penile pain. He denies any penile discharge/swelling, testicular pain/swelling, urinary symptoms, nausea, vomiting, diarrhea, fever, abd pain or other complaints.   Past Medical History:  Diagnosis Date  . CHF (congestive heart failure) (HCC)   . Cough    a. PFTS 09/2012: restriction probable, further examinations recommended. Saw pulm 11/2012: placed on GERD rx and short course prednisone, planned to regroup.  . CVA (cerebral vascular accident) (HCC) 01/23/2013   tPA administered; MRI brain showed small acute R MCA infarct  . History of palpitations    2x/month for several years feels heart racing  . Nonischemic cardiomyopathy (HCC)    a. Identified 10/2012 by echo EF 20-25%. b. Echo 01/2013: EF 20-25%, TEE EF 15% with severe RV dysfunction as well.  [name] PFO (patent foramen ovale)    a. By TEE 01/2013.    Patient Active Problem List   Diagnosis Date Noted  . Trapezius muscle strain 11/24/2014  . Right shoulder pain 08/24/2014  . TIA (transient ischemic attack) 05/20/2014  . History of CVA (cerebrovascular accident)   . Essential hypertension   . HLD (hyperlipidemia)   . Encounter for  long-term (current) use of high-risk medication 01/27/2013  . Patent foramen ovale with right to left shunt 01/27/2013  . Stroke, acute, embolic (HCC) 01/25/2013  . Cough 10/20/2012  . Chronic systolic congestive heart failure (HCC) 10/20/2012  . Penile pain 05/19/2012  . ATTENTION DEFICIT, W/HYPERACTIVITY 09/04/2006    Past Surgical History:  Procedure Laterality Date  . NO PAST SURGERIES    . TEE WITHOUT CARDIOVERSION N/A 01/26/2013   Procedure: TRANSESOPHAGEAL ECHOCARDIOGRAM (TEE);  Surgeon: [name], MD;  Location[facility] ENDOSCOPY;  Service: Cardiovascular;  Laterality: N/A;       Home Medications    Prior to Admission medications   Medication Sig Start Date End Date Taking? Authorizing Provider  atorvastatin (LIPITOR) 10 MG tablet Take 1 tablet (10 mg total) by mouth daily at 6 PM. 07/27/15   [name], MD  carvedilol (COREG) 25 MG tablet take 1 tablet by mouth twice a day 07/27/15   [name], MD  clotrimazole-betamethasone (LOTRISONE) cream Apply 1 application topically 2 (two) times daily. 04/25/16   Elizabeth Whitney McVey, PA-C  sacubitril-valsartan (ENTRESTO) 49-51 MG Take 1 tablet by mouth 2 (two) times daily. 07/27/15   [name], MD  spironolactone (ALDACTONE) 25 MG tablet Take 1 tablet (25 mg total) by mouth daily. 07/27/15   [name], MD    Family History Family History  Problem Relation Age of Onset  . Sarcoidosis Mother   . Heart Problems Mother     PPM implantation age 54    Social History Social History  Substance Use  Topics  . Smoking status: Never Smoker  . Smokeless tobacco: Never Used  . Alcohol use No     Allergies   Patient has no known allergies.   Review of Systems Review of Systems  Constitutional: Negative for fever.  Gastrointestinal: Negative for diarrhea, nausea and vomiting.  Genitourinary: Positive for penile pain. Negative for discharge, dysuria, frequency, hematuria, penile swelling, scrotal swelling,  testicular pain and urgency.  All other systems reviewed and are negative.    Physical Exam Updated Vital Signs BP 116/90 (BP Location: Left Arm)   Pulse 73   Temp 98.2 F (36.8 C)   Resp 18   Ht 6\' 3"  (1.905 m)   Wt 270 lb (122.5 kg)   SpO2 100%   BMI 33.75 kg/m   Physical Exam  Constitutional: He appears well-developed and well-nourished. No distress.  HENT:  Head: Normocephalic and atraumatic.  Eyes: Conjunctivae are normal.  Neck: Neck supple.  Cardiovascular: Normal rate.   Pulmonary/Chest: Effort normal.  Abdominal: Soft. Bowel sounds are normal. He exhibits no distension. There is no tenderness. There is no rebound and no guarding.  Genitourinary: Testes normal and penis normal. Cremasteric reflex is present. Right testis shows no mass, no swelling and no tenderness. Left testis shows no mass, no swelling and no tenderness. Circumcised. No discharge found.  Musculoskeletal: Normal range of motion.  Lymphadenopathy: No inguinal adenopathy noted on the right or left side.  Neurological: He is alert.  Skin: Skin is warm and dry.  Psychiatric: He has a normal mood and affect.  Nursing note and vitals reviewed.    ED Treatments / Results  DIAGNOSTIC STUDIES: Oxygen Saturation is 100% on RA, normal by my interpretation.    COORDINATION OF CARE: 4:23 PM- Pt advised of plan for treatment and pt agrees.  Labs (all labs ordered are listed, but only abnormal results are displayed) Labs Reviewed  URINALYSIS, ROUTINE W REFLEX MICROSCOPIC - Abnormal; Notable for the following:       Result Value   Specific Gravity, Urine 1.003 (*)    All other components within normal limits  GC/CHLAMYDIA PROBE AMP ([place]) NOT AT [facility]    EKG  EKG Interpretation None       Radiology No results found.  Procedures Procedures (including critical care time)  Medications Ordered in ED Medications  cefTRIAXone (ROCEPHIN) injection 250 mg (250 mg Intramuscular Given 08/18/16  1635)  azithromycin (ZITHROMAX) tablet 1,000 mg (1,000 mg Oral Given 08/18/16 1635)  metroNIDAZOLE (FLAGYL) tablet 2,000 mg (2,000 mg Oral Given 08/18/16 1635)  lidocaine (PF) (XYLOCAINE) 1 % injection (1 mL  Given 08/18/16 1636)     Initial Impression / Assessment and Plan / ED Course  I have reviewed the triage vital signs and the nursing notes.  Pertinent labs & imaging results that were available during my care of the patient were reviewed by me and considered in my medical decision making (see chart for details).     Pt arrives for asymptomatic STD check. Patient states that his sexual partner was treated for Trichomonas. UA shows no signs of infection. Patient concern for gonorrhea and Chlamydia. We'll treat for gonorrhea, chlamydia, Trichomonas. Patient given Rocephin, azithromycin, Flagyl. GC and chlamydia cultures are pending. Discussed safe sexual practices. Patient encouraged to form all sexual partners if he was treated for an STD. Patient refused HIV or syphilis testing. Pt is advised to follow up for free testing at local health department in the future. Pt appears safe for discharge.  Final Clinical Impressions(s) / ED Diagnoses   Final diagnoses:  STD exposure    New Prescriptions New Prescriptions   No medications on file   I personally performed the services described in this documentation, which was scribed in my presence. The recorded information has been reviewed and is accurate.     Rise Mu, PA-C 08/18/16 1648    Tilden Fossa, MD 08/19/16 [phone]

## 2016-08-19 LAB — GC/CHLAMYDIA PROBE AMP (~~LOC~~) NOT AT ARMC
Chlamydia: NEGATIVE
Neisseria Gonorrhea: NEGATIVE

## 2016-08-30 ENCOUNTER — Ambulatory Visit (INDEPENDENT_AMBULATORY_CARE_PROVIDER_SITE_OTHER): Payer: Commercial Managed Care - HMO | Admitting: Physician Assistant

## 2016-08-30 VITALS — BP 114/80 | HR 68 | Temp 98.8°F | Resp 16 | Ht 75.0 in | Wt 272.0 lb

## 2016-08-30 DIAGNOSIS — Z202 Contact with and (suspected) exposure to infections with a predominantly sexual mode of transmission: Secondary | ICD-10-CM | POA: Diagnosis not present

## 2016-08-30 DIAGNOSIS — A599 Trichomoniasis, unspecified: Secondary | ICD-10-CM | POA: Diagnosis not present

## 2016-08-30 MED ORDER — METRONIDAZOLE 500 MG PO TABS: 1000.0000 mg | ORAL_TABLET | Freq: Two times a day (BID) | ORAL | 0 refills | Status: AC

## 2016-08-30 NOTE — Patient Instructions (Addendum)
Trichomoniasis Trichomoniasis is an infection caused by an organism called Trichomonas. The infection can affect both women and men. In women, the outer male genitalia and the vagina are affected. In men, the penis is mainly affected, but the prostate and other reproductive organs can also be involved. Trichomoniasis is a sexually transmitted infection (STI) and is most often passed to another person through sexual contact.  RISK FACTORS  Having unprotected sexual intercourse.  Having sexual intercourse with an infected partner. SIGNS AND SYMPTOMS  Symptoms of trichomoniasis in women include:  Abnormal gray-green frothy vaginal discharge.  Itching and irritation of the vagina.  Itching and irritation of the area outside the vagina. Symptoms of trichomoniasis in men include:   Penile discharge with or without pain.  Pain during urination. This results from inflammation of the urethra. DIAGNOSIS  Trichomoniasis may be found during a Pap test or physical exam. Your health care provider may use one of the following methods to help diagnose this infection:  Testing the pH of the vagina with a test tape.  Using a vaginal swab test that checks for the Trichomonas organism. A test is available that provides results within a few minutes.  Examining a urine sample.  Testing vaginal secretions. Your health care provider may test you for other STIs, including HIV. TREATMENT   You may be given medicine to fight the infection. Women should inform their health care provider if they could be or are pregnant. Some medicines used to treat the infection should not be taken during pregnancy.  Your health care provider may recommend over-the-counter medicines or creams to decrease itching or irritation.  Your sexual partner will need to be treated if infected.  Your health care provider may test you for infection again 3 months after treatment. HOME CARE INSTRUCTIONS   Take medicines only as  directed by your health care provider.  Take over-the-counter medicine for itching or irritation as directed by your health care provider.  Do not have sexual intercourse while you have the infection.  Women should not douche or wear tampons while they have the infection.  Discuss your infection with your partner. Your partner may have gotten the infection from you, or you may have gotten it from your partner.  Have your sex partner get examined and treated if necessary.  Practice safe, informed, and protected sex.  See your health care provider for other STI testing. SEEK MEDICAL CARE IF:   You still have symptoms after you finish your medicine.  You develop abdominal pain.  You have pain when you urinate.  You have bleeding after sexual intercourse.  You develop a rash.  Your medicine makes you sick or makes you throw up (vomit). MAKE SURE YOU:  Understand these instructions.  Will watch your condition.  Will get help right away if you are not doing well or get worse. This information is not intended to replace advice given to you by your health care provider. Make sure you discuss any questions you have with your health care provider. Document Released: 12/18/2000 Document Revised: 07/15/2014 Document Reviewed: 04/05/2013 Elsevier Interactive Patient Education  2017 [employer].    IF you received an x-ray today, you will receive an invoice from [facility]. Please contact [facility] at [phone] with questions or concerns regarding your invoice.   IF you received labwork today, you will receive an invoice from [facility]. Please contact [facility] at [phone] with questions or concerns regarding your invoice.   Our billing staff will not  be able to assist you with questions regarding bills from these companies.  You will be contacted with the lab results as soon as they are available. The fastest way to get your results is to activate your My  Chart account. Instructions are located on the last page of this paperwork. If you have not heard from [place] regarding the results in 2 weeks, please contact this office.

## 2016-08-30 NOTE — Progress Notes (Signed)
[facility] [address] [place] [address] [phone]  Date:  08/30/2016   Name:  Nathan Baird   DOB:  27-Jul-1980   MRN:  [id number]  PCP:  No PCP Per Patient    History of Present Illness:  Nathan Baird is a 36 y.o. male patient who presents to [facility] for std testing.    2 days ago, he received informaitoin that he was sexually active with an individual 1.5 weeks ago, who has been diagnosed with trichomoniasis.   No pruritus, no dishcarge, pain, dysuria, hematuria,.  Last week he thought he noticed some urinary frequency.   std  She had that  Patient Active Problem List   Diagnosis Date Noted  . Trapezius muscle strain 11/24/2014  . Right shoulder pain 08/24/2014  . TIA (transient ischemic attack) 05/20/2014  . History of CVA (cerebrovascular accident)   . Essential hypertension   . HLD (hyperlipidemia)   . Encounter for long-term (current) use of high-risk medication 01/27/2013  . Patent foramen ovale with right to left shunt 01/27/2013  . Stroke, acute, embolic (HCC) 01/25/2013  . Cough 10/20/2012  . Chronic systolic congestive heart failure (HCC) 10/20/2012  . Penile pain 05/19/2012  . ATTENTION DEFICIT, W/HYPERACTIVITY 09/04/2006    Past Medical History:  Diagnosis Date  . CHF (congestive heart failure) (HCC)   . Cough    a. PFTS 09/2012: restriction probable, further examinations recommended. Saw pulm 11/2012: placed on GERD rx and short course prednisone, planned to regroup.  . CVA (cerebral vascular accident) (HCC) 01/23/2013   tPA administered; MRI brain showed small acute R MCA infarct  . History of palpitations    2x/month for several years feels heart racing  . Nonischemic cardiomyopathy (HCC)    a. Identified 10/2012 by echo EF 20-25%. b. Echo 01/2013: EF 20-25%, TEE EF 15% with severe RV dysfunction as well.  [name] PFO (patent foramen ovale)    a. By TEE 01/2013.    Past Surgical History:  Procedure Laterality Date  . NO PAST  SURGERIES    . TEE WITHOUT CARDIOVERSION N/A 01/26/2013   Procedure: TRANSESOPHAGEAL ECHOCARDIOGRAM (TEE);  Surgeon: [name], MD;  Location: [facility] ENDOSCOPY;  Service: Cardiovascular;  Laterality: N/A;    Social History  Substance Use Topics  . Smoking status: Never Smoker  . Smokeless tobacco: Never Used  . Alcohol use No    Family History  Problem Relation Age of Onset  . Sarcoidosis Mother   . Heart Problems Mother     PPM implantation age 55    No Known Allergies  Medication list has been reviewed and updated.  Current Outpatient Prescriptions on File Prior to Visit  Medication Sig Dispense Refill  . atorvastatin (LIPITOR) 10 MG tablet Take 1 tablet (10 mg total) by mouth daily at 6 PM. 31 tablet 11  . carvedilol (COREG) 25 MG tablet take 1 tablet by mouth twice a day 180 tablet 2  . clotrimazole-betamethasone (LOTRISONE) cream Apply 1 application topically 2 (two) times daily. 30 g 0  . sacubitril-valsartan (ENTRESTO) 49-51 MG Take 1 tablet by mouth 2 (two) times daily. 60 tablet 11  . spironolactone (ALDACTONE) 25 MG tablet Take 1 tablet (25 mg total) by mouth daily. 31 tablet 11   No current facility-administered medications on file prior to visit.     ROS ROS otherwise unremarkable unless listed above.   Physical Examination: BP 114/80   Pulse 68   Temp 98.8 F (  37.1 C) (Oral)   Resp 16   Ht 6\' 3"  (1.905 m)   Wt 272 lb (123.4 kg)   SpO2 97%   BMI 34.00 kg/m  Ideal Body Weight: Weight in (lb) to have BMI = 25: 199.6  Physical Exam  Constitutional: He is oriented to person, place, and time. He appears well-developed and well-nourished. No distress.  HENT:  Head: Normocephalic and atraumatic.  Eyes: Conjunctivae and EOM are normal. Pupils are equal, round, and reactive to light.  Cardiovascular: Normal rate.   Pulmonary/Chest: Effort normal. No respiratory distress.  Genitourinary: Penis normal. No penile tenderness. No discharge found.   Neurological: He is alert and oriented to person, place, and time.  Skin: Skin is warm and dry. He is not diaphoretic.  Psychiatric: He has a normal mood and affect. His behavior is normal.     Assessment and Plan: Nathan Baird is a 36 y.o. male who is here today for cc of std testing.  Exposure to trichomonas - Plan: metroNIDAZOLE (FLAGYL) 500 MG tablet  Trichimoniasis - Plan: metroNIDAZOLE (FLAGYL) 500 MG tablet  Trena Platt, PA-C [facility] 2/26/201812:40 PM

## 2016-09-01 ENCOUNTER — Other Ambulatory Visit: Payer: Self-pay | Admitting: Physician Assistant

## 2016-09-01 DIAGNOSIS — A599 Trichomoniasis, unspecified: Secondary | ICD-10-CM

## 2016-09-03 ENCOUNTER — Telehealth: Payer: Self-pay | Admitting: Family Medicine

## 2016-09-03 NOTE — Telephone Encounter (Signed)
Pt calling stating that the Flagyl that was given to him by Judeth Cornfield the dropped all 4 of the pills on the kitchen floor and cant find none of them but only 1 he asked if it harmful to his dog I advised him to call his V[facility] please call pt and he wants to use the [facility] on [address]

## 2016-09-03 NOTE — Telephone Encounter (Signed)
Pt calling again stating that his pharmacy called him to let him know that a refill has been denied. I did not see any documentation on our end and the original call was only 2 hours ago. I let him know that we would look into it and either contact the pharmacy to correct any error and get the refill, or call him and let him know why it would be denied.  Best call back [phone].

## 2016-09-04 MED ORDER — METRONIDAZOLE 500 MG PO TABS: ORAL_TABLET | ORAL | 0 refills | Status: DC

## 2016-09-04 NOTE — Telephone Encounter (Signed)
Sent to pharmacy and contacted patient.

## 2016-09-06 ENCOUNTER — Other Ambulatory Visit: Payer: Self-pay | Admitting: Cardiovascular Disease

## 2016-09-06 DIAGNOSIS — Z8673 Personal history of transient ischemic attack (TIA), and cerebral infarction without residual deficits: Secondary | ICD-10-CM

## 2016-09-06 DIAGNOSIS — I5022 Chronic systolic (congestive) heart failure: Secondary | ICD-10-CM

## 2016-10-22 ENCOUNTER — Other Ambulatory Visit: Payer: Self-pay | Admitting: Cardiovascular Disease

## 2016-10-22 DIAGNOSIS — Z8673 Personal history of transient ischemic attack (TIA), and cerebral infarction without residual deficits: Secondary | ICD-10-CM

## 2016-10-22 DIAGNOSIS — I5022 Chronic systolic (congestive) heart failure: Secondary | ICD-10-CM

## 2016-10-24 ENCOUNTER — Ambulatory Visit (INDEPENDENT_AMBULATORY_CARE_PROVIDER_SITE_OTHER): Payer: Commercial Managed Care - HMO | Admitting: Cardiovascular Disease

## 2016-10-24 ENCOUNTER — Encounter: Payer: Self-pay | Admitting: Cardiovascular Disease

## 2016-10-24 DIAGNOSIS — I5022 Chronic systolic (congestive) heart failure: Secondary | ICD-10-CM | POA: Diagnosis not present

## 2016-10-24 DIAGNOSIS — Z8673 Personal history of transient ischemic attack (TIA), and cerebral infarction without residual deficits: Secondary | ICD-10-CM

## 2016-10-24 LAB — COMPREHENSIVE METABOLIC PANEL
ALBUMIN: 4.4 g/dL (ref 3.5–5.5)
ALK PHOS: 58 IU/L (ref 39–117)
ALT: 18 IU/L (ref 0–44)
AST: 16 IU/L (ref 0–40)
Albumin/Globulin Ratio: 1.6 (ref 1.2–2.2)
BUN / CREAT RATIO: 12 (ref 9–20)
BUN: 13 mg/dL (ref 6–20)
Bilirubin Total: 0.2 mg/dL (ref 0.0–1.2)
CHLORIDE: 99 mmol/L (ref 96–106)
CO2: 22 mmol/L (ref 18–29)
CREATININE: 1.11 mg/dL (ref 0.76–1.27)
Calcium: 9.8 mg/dL (ref 8.7–10.2)
GFR calc Af Amer: 99 mL/min/{1.73_m2} (ref >59)
GFR calc non Af Amer: 86 mL/min/{1.73_m2} (ref >59)
GLUCOSE: 103 mg/dL — AB (ref 65–99)
Globulin, Total: 2.7 g/dL (ref 1.5–4.5)
Potassium: 4.2 mmol/L (ref 3.5–5.2)
Sodium: 140 mmol/L (ref 134–144)
TOTAL PROTEIN: 7.1 g/dL (ref 6.0–8.5)

## 2016-10-24 LAB — LIPID PANEL
CHOL/HDL RATIO: 2.8 ratio (ref 0.0–5.0)
Cholesterol, Total: 141 mg/dL (ref 100–199)
HDL: 50 mg/dL (ref >39)
LDL Calculated: 72 mg/dL (ref 0–99)
Triglycerides: 97 mg/dL (ref 0–149)
VLDL CHOLESTEROL CAL: 19 mg/dL (ref 5–40)

## 2016-10-24 MED ORDER — SPIRONOLACTONE 25 MG PO TABS: 25.0000 mg | ORAL_TABLET | Freq: Every day | ORAL | 3 refills | Status: DC

## 2016-10-24 MED ORDER — SACUBITRIL-VALSARTAN 49-51 MG PO TABS: 1.0000 | ORAL_TABLET | Freq: Two times a day (BID) | ORAL | 3 refills | Status: DC

## 2016-10-24 MED ORDER — CARVEDILOL 25 MG PO TABS: 25.0000 mg | ORAL_TABLET | Freq: Two times a day (BID) | ORAL | 3 refills | Status: DC

## 2016-10-24 MED ORDER — ATORVASTATIN CALCIUM 10 MG PO TABS: 10.0000 mg | ORAL_TABLET | Freq: Every day | ORAL | 3 refills | Status: DC

## 2016-10-24 NOTE — Patient Instructions (Signed)
Medication Instructions:  Your physician recommends that you continue on your current medications as directed. Please refer to the Current Medication list given to you today.   Labwork: TODAY - cholesterol, complete metabolic panel   Testing/Procedures: Your physician has requested that you have an echocardiogram. Echocardiography is a painless test that uses sound waves to create images of your heart. It provides your doctor with information about the size and shape of your heart and how well your heart's chambers and valves are working. This procedure takes approximately one hour. There are no restrictions for this procedure.   Follow-Up: Your physician wants you to follow-up in: 6 months with Dr. Nahser.  You will receive a reminder letter in the mail two months in advance. If you don't receive a letter, please call our office to schedule the follow-up appointment.   If you need a refill on your cardiac medications before your next appointment, please call your pharmacy.   Thank you for choosing [facility]! Michelle Swinyer, RN [phone]    

## 2016-10-24 NOTE — Progress Notes (Signed)
Cardiology Office Note   Date:  10/24/2016   ID:  Nathan Baird, DOB 03-09-1981, MRN [id number]  PCP:  Sebastian Ache, PA-C  Cardiologist:   Kristeen Miss, MD   Chief Complaint  Patient presents with  . Follow-up    CHF   1. Congestive heart failure-ejection fraction of 20-25% by echo 2. Restrictive lung disease 3. CVA 4. Right shoulder injury-  History of Present Illness:  Nathan Baird is a 36 year old gentleman who is a new consultation for further evaluation of his cough. He had an echocardiogram and was found to have ejection fraction of between 20 and 25%. I started him on Lasix 40 mg a day as well as potassium chloride 20 mg a day. He developed severe orthostatic hypotension and felt very poorly after taking one dose Lasix and he stopped.  Surprisingly, he does not have much shortness of breath. His basketball on a regular basis. He is able to play for hours at a time and does not have any significant shortness of breath. He does have a cough. He's tried a Z-Pak which cleared up the greenish color of his sputum but he is still having this cough.  He denies any PND orthopnea. He denies any syncope or presyncope. He denies any chest pain. He does not have any cardiac history.  Dec 02, 2012: Nathan Baird is feeling a bit better. His cough has improved. He still has episodes of profound fatigue. His breathing is better. He has greatly reduced his salt intake. He still plays basket ball on a regular basis.   March 10, 2013: Nov. 3, 2014: Nathan Baird has gained some weight since his last ov. He complains of a slight head ache / dizziness. He has occasional episodes of orthostasis. Otherwise, he is feeling well - breathing well.   Jan. 30, 2015: Nathan Baird is doing OK. He played basketball 2 weeks ago and felt great. He was able to play without dyspnea.  His most recent echocardiogram in November revealed improvement of his left ventricular systolic function. The official reading  on the echo reports an ejection fraction of 30%  Reviewed the echo and I think that his ejection fraction is actually a little bit better- at least 35%. In one view, the Simpson calculation reported 42%.  October 08, 2013: Nathan Baird seems to be doing fairly well. We increased his losartan to 100 mg a day during his last visit but he started having episodes of lightheadedness any developed a headache. The symptoms resolved when he decreased his dose back down to the 50 mg where we were previously.  Nov 09, 2013: Nathan Baird is doing ok.   December 10, 2013: Nathan Baird is doing well  Sept. 8, 2015: Nathan Baird is doing well. No dyspnea. His last echocardiogram revealed his ejection fraction had decreased back down to 25%. He thinks that his EF has gone back up because he is not having shortness breath and his cough is better.  We have referred him to [name] for consideration for an ICD. He does not have a left bundle branch block so I do not think that he needs a CRT.   Jan. 8, 2016: Nathan Baird had another TIA recently. Was in the hospital. CT showed no new CVA He had stopped his Xarelto.[name] He has run out of his Xarelto again. Also ran out of the losartan or another med. He is feeling much better.   Feb. 18, 2016:  Nathan Baird is a 36 y.o. male who presents for follow up  of his chronic systolic CHf.   He has a right injury - needs to take NSAID.  His sports medicine doctor would not give him anything yet.    November 02, 2014: Nathan Baird is seen back today for follow up visit.  Recent echo shows persistent LV dysfunction - EF 25-30%. Has been on a diet.  Has lost 15 lbs.   Has occasional episodes of orthostasis - I do not think that we can increase his meds any further .   January 30, 2015:  Nathan Baird is doing well. Has seen EP. Just got a new position and wants to wait until he is out of the probation  period.  Is taking his meds regularly   Nov. 9, 2016:  Doing well.  Has gained some weight .  Wants to go ahead  with the ICD.   Last EF earlier this year is 25-30%.   October 24, 2016:  Nathan Baird is doing ok. Has some fatigue.  Checks BP every 3-4 days .   has not had an AICD placed  .   Missed the appt.   .    Past Medical History:  Diagnosis Date  . CHF (congestive heart failure) (HCC)   . Cough    a. PFTS 09/2012: restriction probable, further examinations recommended. Saw pulm 11/2012: placed on GERD rx and short course prednisone, planned to regroup.  . CVA (cerebral vascular accident) (HCC) 01/23/2013   tPA administered; MRI brain showed small acute R MCA infarct  . History of palpitations    2x/month for several years feels heart racing  . Nonischemic cardiomyopathy (HCC)    a. Identified 10/2012 by echo EF 20-25%. b. Echo 01/2013: EF 20-25%, TEE EF 15% with severe RV dysfunction as well.  [name] PFO (patent foramen ovale)    a. By TEE 01/2013.    Past Surgical History:  Procedure Laterality Date  . NO PAST SURGERIES    . TEE WITHOUT CARDIOVERSION N/A 01/26/2013   Procedure: TRANSESOPHAGEAL ECHOCARDIOGRAM (TEE);  Surgeon: [name], MD;  Location: [facility] ENDOSCOPY;  Service: Cardiovascular;  Laterality: N/A;     Current Outpatient Prescriptions  Medication Sig Dispense Refill  . atorvastatin (LIPITOR) 10 MG tablet Take 1 tablet (10 mg total) by mouth daily at 6 PM. 31 tablet 11  . carvedilol (COREG) 25 MG tablet take 1 tablet by mouth twice a day 60 tablet 0  . clotrimazole-betamethasone (LOTRISONE) cream Apply 1 application topically 2 (two) times daily. 30 g 0  . ENTRESTO 49-51 MG take 1 tablet by mouth twice a day 60 tablet 0  . metroNIDAZOLE (FLAGYL) 500 MG tablet 2 pills then repeat in 12 h 4 tablet 0  . spironolactone (ALDACTONE) 25 MG tablet Take 1 tablet (25 mg total) by mouth daily. *Patient is overdue for an appointment and must call and schedule for further refills* 15 tablet 0   No current facility-administered medications for this visit.     Allergies:   Patient has no known  allergies.    Social History:  The patient  reports that he has never smoked. He has never used smokeless tobacco. He reports that he does not drink alcohol or use drugs.   Family History:  The patient's family history includes Heart Problems in his mother; Sarcoidosis in his mother.    ROS:  Please see the history of present illness.    Review of Systems: Constitutional:  denies fever, chills, diaphoresis, appetite change and fatigue.  HEENT: denies photophobia, eye pain,  redness, hearing loss, ear pain, congestion, sore throat, rhinorrhea, sneezing, neck pain, neck stiffness and tinnitus.  Respiratory: denies SOB, DOE, cough, chest tightness, and wheezing.  Cardiovascular: denies chest pain, palpitations and leg swelling.  Gastrointestinal: denies nausea, vomiting, abdominal pain, diarrhea, constipation, blood in stool.  Genitourinary: denies dysuria, urgency, frequency, hematuria, flank pain and difficulty urinating.  Musculoskeletal: denies  myalgias, back pain, joint swelling, arthralgias and gait problem.   Skin: denies pallor, rash and wound.  Neurological: denies dizziness, seizures, syncope, weakness, light-headedness, numbness and headaches.   Hematological: denies adenopathy, easy bruising, personal or family bleeding history.  Psychiatric/ Behavioral: denies suicidal ideation, mood changes, confusion, nervousness, sleep disturbance and agitation.       All other systems are reviewed and negative.    PHYSICAL EXAM: VS:  BP 94/78   Pulse 76   Ht 6\' 3"  (1.905 m)   Wt 277 lb 12.8 oz (126 kg)   BMI 34.72 kg/m  , BMI Body mass index is 34.72 kg/m. GEN: Well nourished, well developed, in no acute distress  HEENT: normal  Neck: no JVD, carotid bruits, or masses Cardiac: RRR; no murmurs, rubs, or gallops,no edema  Respiratory:  clear to auscultation bilaterally, normal work of breathing GI: soft, nontender, nondistended, + BS MS: no deformity or atrophy  Skin: warm and  dry, no rash Neuro:  Strength and sensation are intact Psych: normal   EKG:  EKG is ordered today. October 24, 2016:    NSR at 56.   NS T wave abn.     Recent Labs: No results found for requested labs within last 8760 hours.    Lipid Panel    Component Value Date/Time   CHOL 162 05/20/2014 0026   TRIG 141 05/20/2014 0026   HDL 45 05/20/2014 0026   CHOLHDL 3.6 05/20/2014 0026   VLDL 28 05/20/2014 0026   LDLCALC 89 05/20/2014 0026      Wt Readings from Last 3 Encounters:  10/24/16 277 lb 12.8 oz (126 kg)  08/30/16 272 lb (123.4 kg)  08/18/16 270 lb (122.5 kg)      Other studies Reviewed: Additional studies/ records that were reviewed today include: . Review of the above records demonstrates:    ASSESSMENT AND PLAN:  1. Chronic systolic Congestive heart failure-his ejection fraction has improved slightly but is still 25-30%. He's basically asymptomatic. He plays basketball on a regular basis without chest pain or shortness of breath.  Will repeat his echo. He may need to get back with [name] for consideration for an ICD is his EF is low    2. Restrictive lung disease 3. CVA - he has an EF of 25-30% .  Has had a CVA  4. Right shoulder injury.  Current medicines are reviewed at length with the patient today.  The patient does not have concerns regarding medicines.  The following changes have been made:  no change   Disposition:   FU with me in 6 months  .  Will see [name] for EP eval.    Signed, Kristeen Miss, MD  10/24/2016 12:01 PM    [facility] [address] [place], [place], [place]  [address] Phone: [phone]; Fax: [phone]

## 2016-11-07 ENCOUNTER — Ambulatory Visit (HOSPITAL_COMMUNITY): Payer: Commercial Managed Care - HMO | Attending: Cardiology

## 2016-11-07 ENCOUNTER — Other Ambulatory Visit: Payer: Self-pay

## 2016-11-07 DIAGNOSIS — Z8673 Personal history of transient ischemic attack (TIA), and cerebral infarction without residual deficits: Secondary | ICD-10-CM | POA: Insufficient documentation

## 2016-11-07 DIAGNOSIS — I110 Hypertensive heart disease with heart failure: Secondary | ICD-10-CM | POA: Diagnosis not present

## 2016-11-07 DIAGNOSIS — I371 Nonrheumatic pulmonary valve insufficiency: Secondary | ICD-10-CM | POA: Insufficient documentation

## 2016-11-07 DIAGNOSIS — E785 Hyperlipidemia, unspecified: Secondary | ICD-10-CM | POA: Insufficient documentation

## 2016-11-07 DIAGNOSIS — I5022 Chronic systolic (congestive) heart failure: Secondary | ICD-10-CM | POA: Insufficient documentation

## 2016-11-08 ENCOUNTER — Telehealth: Payer: Self-pay | Admitting: Cardiovascular Disease

## 2016-11-08 NOTE — Telephone Encounter (Signed)
Patient made aware of results:  Notes recorded by [name], MD on 11/08/2016 at 9:20 AM EDT EF is still low Please set him up to see [name] again.  He should reconsider his decision to have subcutaneous ICD.   Aware will have scheduler contact to get appt with [name].  Patient aware and verbalized understanding.  No further questions or concerns at this time.

## 2016-11-08 NOTE — Telephone Encounter (Signed)
New Message   pt verbalized that he is calling for rn   For echo results

## 2016-11-10 ENCOUNTER — Emergency Department (HOSPITAL_BASED_OUTPATIENT_CLINIC_OR_DEPARTMENT_OTHER)
Admission: EM | Admit: 2016-11-10 | Discharge: 2016-11-10 | Disposition: A | Discharge status: Home / Self Care | Payer: Commercial Managed Care - HMO | Attending: Physician Assistant | Admitting: Physician Assistant

## 2016-11-10 ENCOUNTER — Encounter (HOSPITAL_BASED_OUTPATIENT_CLINIC_OR_DEPARTMENT_OTHER): Payer: Self-pay | Admitting: *Deleted

## 2016-11-10 DIAGNOSIS — I110 Hypertensive heart disease with heart failure: Secondary | ICD-10-CM | POA: Diagnosis not present

## 2016-11-10 DIAGNOSIS — A64 Unspecified sexually transmitted disease: Secondary | ICD-10-CM | POA: Insufficient documentation

## 2016-11-10 DIAGNOSIS — N4889 Other specified disorders of penis: Secondary | ICD-10-CM | POA: Diagnosis present

## 2016-11-10 DIAGNOSIS — I509 Heart failure, unspecified: Secondary | ICD-10-CM | POA: Diagnosis not present

## 2016-11-10 DIAGNOSIS — Z79899 Other long term (current) drug therapy: Secondary | ICD-10-CM | POA: Diagnosis not present

## 2016-11-10 LAB — URINALYSIS, ROUTINE W REFLEX MICROSCOPIC
Bilirubin Urine: NEGATIVE
GLUCOSE, UA: NEGATIVE mg/dL
Hgb urine dipstick: NEGATIVE
Ketones, ur: NEGATIVE mg/dL
LEUKOCYTES UA: NEGATIVE
Nitrite: NEGATIVE
PH: 6 (ref 5.0–8.0)
PROTEIN: NEGATIVE mg/dL
SPECIFIC GRAVITY, URINE: 1.025 (ref 1.005–1.030)

## 2016-11-10 MED ORDER — LIDOCAINE HCL (PF) 1 % IJ SOLN
INTRAMUSCULAR | Status: AC
  Administered 2016-11-10: 0.9 mL
  Filled 2016-11-10: qty 5

## 2016-11-10 MED ORDER — AZITHROMYCIN 250 MG PO TABS
1000.0000 mg | ORAL_TABLET | Freq: Once | ORAL | Status: AC
  Administered 2016-11-10: 1000 mg via ORAL
  Filled 2016-11-10: qty 4

## 2016-11-10 MED ORDER — CEFTRIAXONE SODIUM 250 MG IJ SOLR
250.0000 mg | Freq: Once | INTRAMUSCULAR | Status: AC
  Administered 2016-11-10: 250 mg via INTRAMUSCULAR
  Filled 2016-11-10: qty 250

## 2016-11-10 NOTE — ED Provider Notes (Signed)
[facility] Provider Note   CSN: [id number] Arrival date & time: 11/10/16  3662   By signing my name below, I, Clarisse Gouge, attest that this documentation has been prepared under the direction and in the presence of Kamaiya Antilla, Cindee Salt, MD. Electronically signed, Clarisse Gouge, ED Scribe. 11/10/16. 1:03 AM.   History   Chief Complaint Chief Complaint  Patient presents with  . Penis Pain   The history is provided by the patient and medical records. No language interpreter was used.    Nathan Baird is a 36 y.o. male who presents to the Emergency Department with concern for throbbing penis pain 3-4 days. No modifying factors noted. Recent sexual intercourse without barrier noted. No dysuria, penile discharge, rash or any other complaints noted at this time.  Past Medical History:  Diagnosis Date  . CHF (congestive heart failure) (HCC)   . Cough    a. PFTS 09/2012: restriction probable, further examinations recommended. Saw pulm 11/2012: placed on GERD rx and short course prednisone, planned to regroup.  . CVA (cerebral vascular accident) (HCC) 01/23/2013   tPA administered; MRI brain showed small acute R MCA infarct  . History of palpitations    2x/month for several years feels heart racing  . Nonischemic cardiomyopathy (HCC)    a. Identified 10/2012 by echo EF 20-25%. b. Echo 01/2013: EF 20-25%, TEE EF 15% with severe RV dysfunction as well.  [name] PFO (patent foramen ovale)    a. By TEE 01/2013.    Patient Active Problem List   Diagnosis Date Noted  . Trapezius muscle strain 11/24/2014  . Right shoulder pain 08/24/2014  . TIA (transient ischemic attack) 05/20/2014  . History of CVA (cerebrovascular accident)   . Essential hypertension   . HLD (hyperlipidemia)   . Encounter for long-term (current) use of high-risk medication 01/27/2013  . Patent foramen ovale with right to left shunt 01/27/2013  . Stroke, acute, embolic (HCC) 01/25/2013  . Cough 10/20/2012  .  Chronic systolic congestive heart failure (HCC) 10/20/2012  . Penile pain 05/19/2012  . ATTENTION DEFICIT, W/HYPERACTIVITY 09/04/2006    Past Surgical History:  Procedure Laterality Date  . NO PAST SURGERIES    . TEE WITHOUT CARDIOVERSION N/A 01/26/2013   Procedure: TRANSESOPHAGEAL ECHOCARDIOGRAM (TEE);  Surgeon: [name], MD;  Location: [facility] ENDOSCOPY;  Service: Cardiovascular;  Laterality: N/A;       Home Medications    Prior to Admission medications   Medication Sig Start Date End Date Taking? Authorizing Provider  atorvastatin (LIPITOR) 10 MG tablet Take 1 tablet (10 mg total) by mouth daily at 6 PM. 10/24/16   Nahser, Deloris Ping, MD  carvedilol (COREG) 25 MG tablet Take 1 tablet (25 mg total) by mouth 2 (two) times daily. 10/24/16   Nahser, Deloris Ping, MD  clotrimazole-betamethasone (LOTRISONE) cream Apply 1 application topically 2 (two) times daily. 04/25/16   McVey, Madelaine Bhat, PA-C  metroNIDAZOLE (FLAGYL) 500 MG tablet 2 pills then repeat in 12 h 09/04/16   Weber, Dema Severin, PA-C  sacubitril-valsartan (ENTRESTO) 49-51 MG Take 1 tablet by mouth 2 (two) times daily. 10/24/16   Nahser, Deloris Ping, MD  spironolactone (ALDACTONE) 25 MG tablet Take 1 tablet (25 mg total) by mouth daily. 10/24/16   Nahser, Deloris Ping, MD    Family History Family History  Problem Relation Age of Onset  . Sarcoidosis Mother   . Heart Problems Mother     PPM implantation age 16    Social History Social  History  Substance Use Topics  . Smoking status: Never Smoker  . Smokeless tobacco: Never Used  . Alcohol use No     Allergies   Patient has no known allergies.   Review of Systems Review of Systems  Constitutional: Negative for fever.  Genitourinary: Positive for penile pain. Negative for discharge, dysuria, genital sores and penile swelling.  Skin: Negative for rash and wound.  All other systems reviewed and are negative.    Physical Exam Updated Vital Signs BP 124/85   Pulse 77    Temp 98.1 F (36.7 C) (Oral)   Resp 16   Ht 6\' 3"  (1.905 m)   Wt 277 lb (125.6 kg)   SpO2 98%   BMI 34.62 kg/m   Physical Exam  Constitutional: He is oriented to person, place, and time. He appears well-developed and well-nourished.  HENT:  Head: Normocephalic.  Eyes: EOM are normal.  Neck: Normal range of motion.  Pulmonary/Chest: Effort normal.  Abdominal: He exhibits no distension.  Genitourinary: Penis normal. No penile tenderness.  Genitourinary Comments: Chaperone present: Normal penis. No discharge. No tenderness to scrotum/testicles. No rash  Musculoskeletal: Normal range of motion.  Neurological: He is alert and oriented to person, place, and time.  Psychiatric: He has a normal mood and affect.  Nursing note and vitals reviewed.    ED Treatments / Results  DIAGNOSTIC STUDIES: Oxygen Saturation is 98% on RA, NL by my interpretation.    COORDINATION OF CARE: 12:59 AM-Discussed next steps with pt. Pt verbalized understanding and is agreeable with the plan. Will order STD screen. Will treat for STD's per pt's request.   Labs (all labs ordered are listed, but only abnormal results are displayed) Labs Reviewed - No data to display  EKG  EKG Interpretation None       Radiology No results found.  Procedures Procedures (including critical care time)  Medications Ordered in ED Medications - No data to display   Initial Impression / Assessment and Plan / ED Course  I have reviewed the triage vital signs and the nursing notes.  Pertinent labs & imaging results that were available during my care of the patient were reviewed by me and considered in my medical decision making (see chart for details).     Patient's well-appearing 36 year old male presenting with penile pain. The penis pain is at the base of his penis. Not located in the scrotum or testicles. Patient reports it comes and goes. Patient has had unprotected sex. Has had no discharge. Normal GU  exam.Would like prophylactic treatment for gonorrhea and chlamydia. Does not want to be tested for HIV and syphilis at this time.  Final Clinical Impressions(s) / ED Diagnoses   Final diagnoses:  None    New Prescriptions New Prescriptions   No medications on file  I personally performed the services described in this documentation, which was scribed in my presence. The recorded information has been reviewed and is accurate.      Abelino Derrick, MD 11/10/16 [phone]

## 2016-11-10 NOTE — ED Notes (Signed)
Snack given.

## 2016-11-10 NOTE — ED Triage Notes (Signed)
Pt reports penis pain x 1 week. Denies urinary symptoms or discharge.

## 2016-11-11 LAB — GC/CHLAMYDIA PROBE AMP (~~LOC~~) NOT AT ARMC
Chlamydia: NEGATIVE
Neisseria Gonorrhea: NEGATIVE

## 2016-11-26 ENCOUNTER — Telehealth: Payer: Self-pay | Admitting: Internal Medicine

## 2016-11-26 NOTE — Telephone Encounter (Signed)
Will review with the patient when he comes in the office for his follow up with [name] on 11/28/16.

## 2016-11-26 NOTE — Telephone Encounter (Signed)
Called pt to confirm appt for Thursday, pt is sure he wants to do ICD, would like to get earliest poss dates available--pls call

## 2016-11-28 ENCOUNTER — Ambulatory Visit (INDEPENDENT_AMBULATORY_CARE_PROVIDER_SITE_OTHER): Payer: Commercial Managed Care - HMO | Admitting: Internal Medicine

## 2016-11-28 VITALS — BP 98/70 | HR 72 | Ht 75.0 in | Wt 277.0 lb

## 2016-11-28 DIAGNOSIS — I5022 Chronic systolic (congestive) heart failure: Secondary | ICD-10-CM | POA: Diagnosis not present

## 2016-11-28 DIAGNOSIS — I428 Other cardiomyopathies: Secondary | ICD-10-CM | POA: Diagnosis not present

## 2016-11-28 NOTE — Progress Notes (Signed)
Patient Care Team: McVey, Madelaine Bhat, PA-C as PCP - General (Physician Assistant) Nahser, Deloris Ping, MD as Attending Physician (Cardiology)   HPI  Nathan Baird is a 36 y.o. male  Seen for reconsideration of ICD    He presented in 2014 with evidence of congestive heart failure. He has a history of a nonischemic cardiomyopathy.He has been on guideline directed medical therapy for some time.      DATE TEST    7/14 echo   EF20-25%  no evidence of LVH  8/14 MRI Ef 26% Consistent with noncompaction  No scar  4/16 echo   EF 25-30%   5/18 Echo EF 25-30      His father has a history of a cardiomyopathy. His mother has a history of coronary artery disease. He himself has 4 children; they have separate mothers but he is involved with all of them  He has a history of recurrent TIA.he was treated with Rivaroxaban Previously; he is now on aspirin   He is able to work with minimal shortness of breath. He works all day [employer]  And works again in the evening. He denies chest pain shortness of breath or peripheral edema he's had no syncope or palpitations. He is here now because he is concerned about cardiac arrest    Past Medical History:  Diagnosis Date  . CHF (congestive heart failure) (HCC)   . Cough    a. PFTS 09/2012: restriction probable, further examinations recommended. Saw pulm 11/2012: placed on GERD rx and short course prednisone, planned to regroup.  . CVA (cerebral vascular accident) (HCC) 01/23/2013   tPA administered; MRI brain showed small acute R MCA infarct  . History of palpitations    2x/month for several years feels heart racing  . Nonischemic cardiomyopathy (HCC)    a. Identified 10/2012 by echo EF 20-25%. b. Echo 01/2013: EF 20-25%, TEE EF 15% with severe RV dysfunction as well.  [name] PFO (patent foramen ovale)    a. By TEE 01/2013.    Past Surgical History:  Procedure Laterality Date  . NO PAST SURGERIES    . TEE WITHOUT CARDIOVERSION N/A  01/26/2013   Procedure: TRANSESOPHAGEAL ECHOCARDIOGRAM (TEE);  Surgeon: [name], MD;  Location[facility] ENDOSCOPY;  Service: Cardiovascular;  Laterality: N/A;    Current Outpatient Prescriptions  Medication Sig Dispense Refill  . atorvastatin (LIPITOR) 10 MG tablet Take 1 tablet (10 mg total) by mouth daily at 6 PM. 90 tablet 3  . carvedilol (COREG) 25 MG tablet Take 1 tablet (25 mg total) by mouth 2 (two) times daily. 180 tablet 3  . clotrimazole-betamethasone (LOTRISONE) cream Apply 1 application topically 2 (two) times daily. 30 g 0  . metroNIDAZOLE (FLAGYL) 500 MG tablet 2 pills then repeat in 12 h 4 tablet 0  . sacubitril-valsartan (ENTRESTO) 49-51 MG Take 1 tablet by mouth 2 (two) times daily. 180 tablet 3  . spironolactone (ALDACTONE) 25 MG tablet Take 1 tablet (25 mg total) by mouth daily. 90 tablet 3   No current facility-administered medications for this visit.     No Known Allergies    Review of Systems negative except from HPI and PMH  Physical Exam BP 98/70   Pulse 72   Ht 6\' 3"  (1.905 m)   Wt 277 lb (125.6 kg)   SpO2 98%   BMI 34.62 kg/m  Well developed and nourished in no acute distress HENT normal Neck supple with JVP-flat Clear Regular rate and  rhythm, no murmurs or gallops Abd-soft with active BS No Clubbing cyanosis edema Skin-warm and dry A & Oriented  Grossly normal sensory and motor function    Assessment and  Plan  Cardiomyopathy with MRI question of non-compaction  Congestive heart failure-chronic-systolic class II  CVA  Pt with stable LV dysfnction despite Guideline directed medical therapy  We have discussed the relative benefits and merits of transvenous versus subcutaneous ICD implantation.  The advantages of the former including battery longevity, history derived from use in randomized controlled trials and perhaps a somewhat lower rate of inappropriate ICD discharges. Advantages of the latter  include the fact that it is  extravascular,  Resulting different implications of device infection and it being non-transvalvular.    He would like to pursue subcutaneous ICD. I spoke with his family by telephone.  His BP precludes uptitration of Guideline directed medical therapy  Now on qod ASA for his stroke   I reviewed with him CDL recommendations regarding depressed left ventricular function being precluding      Current medicines are reviewed  length with the patient today .  The patient does not  have concerns regarding medicines.

## 2016-11-28 NOTE — Patient Instructions (Addendum)
Medication Instructions:  Your physician recommends that you continue on your current medications as directed. Please refer to the Current Medication list given to you today.   Labwork: BMET / CBC - 1-2 weeks prior to procedrue  Testing/Procedures: Your physician has recommended that you have a defibrillator inserted. An implantable cardioverter defibrillator (ICD) is a small device that is placed in your chest or, in rare cases, your abdomen. This device uses electrical pulses or shocks to help control life-threatening, irregular heartbeats that could lead the heart to suddenly stop beating (sudden cardiac arrest). Leads are attached to the ICD that goes into your heart. This is done in the hospital and usually requires an overnight stay. Please see the instruction sheet given to you today for more information.   Follow-Up: Someone will call from our office with possible dates for ICD Implant

## 2016-11-29 NOTE — Addendum Note (Signed)
Addended by: Dareen Piano on: 11/29/2016 04:29 PM   Modules accepted: Orders

## 2016-12-06 ENCOUNTER — Telehealth: Payer: Self-pay | Admitting: Internal Medicine

## 2016-12-06 ENCOUNTER — Encounter: Payer: Self-pay | Admitting: *Deleted

## 2016-12-06 DIAGNOSIS — Z01812 Encounter for preprocedural laboratory examination: Secondary | ICD-10-CM

## 2016-12-06 DIAGNOSIS — I428 Other cardiomyopathies: Secondary | ICD-10-CM

## 2016-12-06 DIAGNOSIS — I5022 Chronic systolic (congestive) heart failure: Secondary | ICD-10-CM

## 2016-12-06 NOTE — Telephone Encounter (Signed)
I called and spoke with the patient. He is aware that his S-ICD has been scheduled for 01/10/17 at 7:30 am with [name]. He is agreeable. He will come for his pre-procedure labs on 01/02/17.  I advised him I will mail his letter of instructions to him.  He would like these mailed to [address] [place], [place] [address].

## 2016-12-12 ENCOUNTER — Encounter (HOSPITAL_BASED_OUTPATIENT_CLINIC_OR_DEPARTMENT_OTHER): Payer: Self-pay

## 2016-12-12 ENCOUNTER — Emergency Department (HOSPITAL_BASED_OUTPATIENT_CLINIC_OR_DEPARTMENT_OTHER)
Admission: EM | Admit: 2016-12-12 | Discharge: 2016-12-12 | Disposition: A | Discharge status: Home / Self Care | Payer: Commercial Managed Care - HMO | Attending: Emergency Medicine | Admitting: Emergency Medicine

## 2016-12-12 DIAGNOSIS — Z79899 Other long term (current) drug therapy: Secondary | ICD-10-CM | POA: Insufficient documentation

## 2016-12-12 DIAGNOSIS — I5022 Chronic systolic (congestive) heart failure: Secondary | ICD-10-CM | POA: Insufficient documentation

## 2016-12-12 DIAGNOSIS — N4889 Other specified disorders of penis: Secondary | ICD-10-CM | POA: Diagnosis present

## 2016-12-12 DIAGNOSIS — I10 Essential (primary) hypertension: Secondary | ICD-10-CM | POA: Diagnosis not present

## 2016-12-12 LAB — URINALYSIS, ROUTINE W REFLEX MICROSCOPIC
Bilirubin Urine: NEGATIVE
Glucose, UA: NEGATIVE mg/dL
Hgb urine dipstick: NEGATIVE
Ketones, ur: NEGATIVE mg/dL
Leukocytes, UA: NEGATIVE
Nitrite: NEGATIVE
PROTEIN: NEGATIVE mg/dL
Specific Gravity, Urine: 1.017 (ref 1.005–1.030)
pH: 6 (ref 5.0–8.0)

## 2016-12-12 MED ORDER — AZITHROMYCIN 250 MG PO TABS
1000.0000 mg | ORAL_TABLET | Freq: Once | ORAL | Status: AC
  Administered 2016-12-12: 1000 mg via ORAL
  Filled 2016-12-12: qty 4

## 2016-12-12 MED ORDER — CEFTRIAXONE SODIUM 250 MG IJ SOLR
250.0000 mg | Freq: Once | INTRAMUSCULAR | Status: AC
  Administered 2016-12-12: 250 mg via INTRAMUSCULAR
  Filled 2016-12-12: qty 250

## 2016-12-12 NOTE — ED Triage Notes (Signed)
Pt requesting treatment for STD, was treated a month ago and resumed sexual activity the next day, c/o penis pain and dysuria

## 2016-12-12 NOTE — ED Provider Notes (Signed)
[facility] Provider Note   CSN: [id number] Arrival date & time: 12/12/16  2048  By signing my name below, I, Modena Jansky, attest that this documentation has been prepared under the direction and in the presence of non-physician practitioner, Fayrene Helper, PA-C. Electronically Signed: Modena Jansky, Scribe. 12/12/2016. 10:03 PM.  History   Chief Complaint Chief Complaint  Patient presents with  . Penis Pain   The history is provided by the patient. No language interpreter was used.   HPI Comments: Nathan Baird is a 36 y.o. male who presents to the Emergency Department complaining of constant moderate penile pain that started a couple weeks ago. He was seen in the ED on 11/10/16 for similar complaint and discharged with prophylactic treatment of gonorrhea and chlamydia. He tested positive for chlamydia and trichomonas but resumed sexual activity with the same partner after his visit. He came to the ED for returning pain. He describes the pain as a throbbing sensation. He has had only one sexual partner in the past 6 months. Denies any abdominal pain, dysuria, penile discharge, testicular pain, rectal pain, or other complaints at this time.  Past Medical History:  Diagnosis Date  . CHF (congestive heart failure) (HCC)   . Cough    a. PFTS 09/2012: restriction probable, further examinations recommended. Saw pulm 11/2012: placed on GERD rx and short course prednisone, planned to regroup.  . CVA (cerebral vascular accident) (HCC) 01/23/2013   tPA administered; MRI brain showed small acute R MCA infarct  . History of palpitations    2x/month for several years feels heart racing  . Nonischemic cardiomyopathy (HCC)    a. Identified 10/2012 by echo EF 20-25%. b. Echo 01/2013: EF 20-25%, TEE EF 15% with severe RV dysfunction as well.  [name] PFO (patent foramen ovale)    a. By TEE 01/2013.    Patient Active Problem List   Diagnosis Date Noted  . Trapezius muscle strain 11/24/2014  . Right  shoulder pain 08/24/2014  . TIA (transient ischemic attack) 05/20/2014  . History of CVA (cerebrovascular accident)   . Essential hypertension   . HLD (hyperlipidemia)   . Encounter for long-term (current) use of high-risk medication 01/27/2013  . Patent foramen ovale with right to left shunt 01/27/2013  . Stroke, acute, embolic (HCC) 01/25/2013  . Cough 10/20/2012  . Chronic systolic congestive heart failure (HCC) 10/20/2012  . Penile pain 05/19/2012  . ATTENTION DEFICIT, W/HYPERACTIVITY 09/04/2006    Past Surgical History:  Procedure Laterality Date  . NO PAST SURGERIES    . TEE WITHOUT CARDIOVERSION N/A 01/26/2013   Procedure: TRANSESOPHAGEAL ECHOCARDIOGRAM (TEE);  Surgeon: [name], MD;  Location[facility] ENDOSCOPY;  Service: Cardiovascular;  Laterality: N/A;       Home Medications    Prior to Admission medications   Medication Sig Start Date End Date Taking? Authorizing Provider  atorvastatin (LIPITOR) 10 MG tablet Take 1 tablet (10 mg total) by mouth daily at 6 PM. 10/24/16   Nahser, Deloris Ping, MD  carvedilol (COREG) 25 MG tablet Take 1 tablet (25 mg total) by mouth 2 (two) times daily. 10/24/16   Nahser, Deloris Ping, MD  clotrimazole-betamethasone (LOTRISONE) cream Apply 1 application topically 2 (two) times daily. 04/25/16   McVey, Madelaine Bhat, PA-C  metroNIDAZOLE (FLAGYL) 500 MG tablet 2 pills then repeat in 12 h 09/04/16   Weber, Dema Severin, PA-C  sacubitril-valsartan (ENTRESTO) 49-51 MG Take 1 tablet by mouth 2 (two) times daily. 10/24/16   Nahser, Deloris Ping,  MD  spironolactone (ALDACTONE) 25 MG tablet Take 1 tablet (25 mg total) by mouth daily. 10/24/16   Nahser, Deloris Ping, MD    Family History Family History  Problem Relation Age of Onset  . Sarcoidosis Mother   . Heart Problems Mother        PPM implantation age 47    Social History Social History  Substance Use Topics  . Smoking status: Never Smoker  . Smokeless tobacco: Never Used  . Alcohol use No      Allergies   Patient has no known allergies.   Review of Systems Review of Systems  Constitutional: Negative for fever.  Gastrointestinal: Negative for abdominal pain and rectal pain.  Genitourinary: Positive for penile pain. Negative for discharge, dysuria and testicular pain.     Physical Exam Updated Vital Signs BP 116/83 (BP Location: Left Arm)   Pulse 84   Temp 97.9 F (36.6 C) (Oral)   Resp 18   Ht 6\' 3"  (1.905 m)   Wt 277 lb (125.6 kg)   SpO2 100%   BMI 34.62 kg/m   Physical Exam  Constitutional: He appears well-developed and well-nourished. No distress.  HENT:  Head: Normocephalic and atraumatic.  Eyes: Conjunctivae are normal.  Neck: Neck supple.  Cardiovascular: Normal rate.   Pulmonary/Chest: Effort normal.  Abdominal: Soft.  Genitourinary:  Genitourinary Comments: Chaperone present during exam. No inguinal lymphadenopathy or inguinal hernia. Testicle with normal lie. Non tender. No hernia. Normal penis free of rash at the base or shaft.   Musculoskeletal: Normal range of motion.  Neurological: He is alert.  Skin: Skin is warm and dry.  Psychiatric: He has a normal mood and affect.  Nursing note and vitals reviewed.    ED Treatments / Results  DIAGNOSTIC STUDIES: Oxygen Saturation is 100% on RA, normal by my interpretation.    COORDINATION OF CARE: 10:07 PM- Pt advised of plan for treatment, which includes UA, and STD treatment (Rocephin and Zithromax) without STD testing, and pt agrees.  Labs (all labs ordered are listed, but only abnormal results are displayed) Labs Reviewed  URINALYSIS, ROUTINE W REFLEX MICROSCOPIC  URINALYSIS, ROUTINE W REFLEX MICROSCOPIC    EKG  EKG Interpretation None       Radiology No results found.  Procedures Procedures (including critical care time)  Medications Ordered in ED Medications  cefTRIAXone (ROCEPHIN) injection 250 mg (not administered)  azithromycin (ZITHROMAX) tablet 1,000 mg (not  administered)     Initial Impression / Assessment and Plan / ED Course  I have reviewed the triage vital signs and the nursing notes.  Pertinent labs & imaging results that were available during my care of the patient were reviewed by me and considered in my medical decision making (see chart for details).     BP 116/83 (BP Location: Left Arm)   Pulse 84   Temp 97.9 F (36.6 C) (Oral)   Resp 18   Ht 6\' 3"  (1.905 m)   Wt 125.6 kg (277 lb)   SpO2 100%   BMI 34.62 kg/m   Patient treated in the ED for STI with Rocephin and Zithromax. Patient advised to inform and treat all sexual partners.  Pt advised on safe sex practices and understands Pt encouraged to follow up at local health department for future STI checks. Discussed return precautions. Pt appears safe for discharge.   Final Clinical Impressions(s) / ED Diagnoses   Final diagnoses:  Penis pain    New Prescriptions New Prescriptions   No  medications on file   I personally performed the services described in this documentation, which was scribed in my presence. The recorded information has been reviewed and is accurate.       Fayrene Helper, PA-C 12/12/16 2229    Vanetta Mulders, MD 12/14/16 1058

## 2016-12-12 NOTE — Discharge Instructions (Signed)
Avoid sexual activities until your condition completely resolve.

## 2017-01-02 ENCOUNTER — Other Ambulatory Visit: Payer: Commercial Managed Care - HMO | Admitting: *Deleted

## 2017-01-02 DIAGNOSIS — Z01812 Encounter for preprocedural laboratory examination: Secondary | ICD-10-CM

## 2017-01-02 DIAGNOSIS — I428 Other cardiomyopathies: Secondary | ICD-10-CM

## 2017-01-02 DIAGNOSIS — I5022 Chronic systolic (congestive) heart failure: Secondary | ICD-10-CM

## 2017-01-02 LAB — CBC WITH DIFFERENTIAL/PLATELET
BASOS: 0 %
Basophils Absolute: 0 10*3/uL (ref 0.0–0.2)
EOS (ABSOLUTE): 0.2 10*3/uL (ref 0.0–0.4)
EOS: 2 %
HEMATOCRIT: 43.2 % (ref 37.5–51.0)
HEMOGLOBIN: 14.8 g/dL (ref 13.0–17.7)
IMMATURE GRANS (ABS): 0 10*3/uL (ref 0.0–0.1)
IMMATURE GRANULOCYTES: 0 %
Lymphocytes Absolute: 2.9 10*3/uL (ref 0.7–3.1)
Lymphs: 43 %
MCH: 29.8 pg (ref 26.6–33.0)
MCHC: 34.3 g/dL (ref 31.5–35.7)
MCV: 87 fL (ref 79–97)
MONOCYTES: 8 %
Monocytes Absolute: 0.6 10*3/uL (ref 0.1–0.9)
NEUTROS PCT: 47 %
Neutrophils Absolute: 3.1 10*3/uL (ref 1.4–7.0)
Platelets: 286 10*3/uL (ref 150–379)
RBC: 4.97 x10E6/uL (ref 4.14–5.80)
RDW: 13.5 % (ref 12.3–15.4)
WBC: 6.8 10*3/uL (ref 3.4–10.8)

## 2017-01-02 LAB — BASIC METABOLIC PANEL
BUN/Creatinine Ratio: 12 (ref 9–20)
BUN: 14 mg/dL (ref 6–20)
CO2: 20 mmol/L (ref 20–29)
CREATININE: 1.15 mg/dL (ref 0.76–1.27)
Calcium: 9.9 mg/dL (ref 8.7–10.2)
Chloride: 103 mmol/L (ref 96–106)
GFR calc Af Amer: 95 mL/min/{1.73_m2} (ref >59)
GFR, EST NON AFRICAN AMERICAN: 82 mL/min/{1.73_m2} (ref >59)
Glucose: 112 mg/dL — ABNORMAL HIGH (ref 65–99)
Potassium: 4 mmol/L (ref 3.5–5.2)
SODIUM: 141 mmol/L (ref 134–144)

## 2017-01-02 LAB — PROTIME-INR
INR: 1.1 (ref 0.8–1.2)
PROTHROMBIN TIME: 11.2 s (ref 9.1–12.0)

## 2017-01-06 ENCOUNTER — Telehealth: Payer: Self-pay | Admitting: *Deleted

## 2017-01-06 NOTE — Telephone Encounter (Signed)
-----   Message from Duke Salvia, MD sent at 01/04/2017 12:02 PM EDT ----- Please Inform Patient that labs are normal

## 2017-01-06 NOTE — Telephone Encounter (Signed)
Pt has been notified of lab results by phone with verbal understanding. Pt thanked me for my call today.   

## 2017-01-10 ENCOUNTER — Ambulatory Visit (HOSPITAL_COMMUNITY): Payer: Commercial Managed Care - HMO | Admitting: Anesthesiology

## 2017-01-10 ENCOUNTER — Ambulatory Visit (HOSPITAL_COMMUNITY)
Admission: RE | Admit: 2017-01-10 | Discharge: 2017-01-10 | Disposition: A | Discharge status: Home / Self Care | Payer: Commercial Managed Care - HMO | Source: Ambulatory Visit | Attending: Internal Medicine | Admitting: Internal Medicine

## 2017-01-10 ENCOUNTER — Encounter (HOSPITAL_COMMUNITY): Payer: Self-pay | Admitting: Certified Registered Nurse Anesthetist

## 2017-01-10 ENCOUNTER — Encounter (HOSPITAL_COMMUNITY)
Admission: RE | Disposition: A | Discharge status: Home / Self Care | Payer: Self-pay | Source: Ambulatory Visit | Attending: Internal Medicine

## 2017-01-10 ENCOUNTER — Ambulatory Visit (HOSPITAL_COMMUNITY): Payer: Commercial Managed Care - HMO

## 2017-01-10 DIAGNOSIS — I5022 Chronic systolic (congestive) heart failure: Secondary | ICD-10-CM | POA: Diagnosis present

## 2017-01-10 DIAGNOSIS — Z01818 Encounter for other preprocedural examination: Secondary | ICD-10-CM | POA: Insufficient documentation

## 2017-01-10 DIAGNOSIS — Z79899 Other long term (current) drug therapy: Secondary | ICD-10-CM | POA: Diagnosis not present

## 2017-01-10 DIAGNOSIS — Z8673 Personal history of transient ischemic attack (TIA), and cerebral infarction without residual deficits: Secondary | ICD-10-CM | POA: Diagnosis not present

## 2017-01-10 DIAGNOSIS — I429 Cardiomyopathy, unspecified: Secondary | ICD-10-CM | POA: Insufficient documentation

## 2017-01-10 DIAGNOSIS — I739 Peripheral vascular disease, unspecified: Secondary | ICD-10-CM | POA: Insufficient documentation

## 2017-01-10 DIAGNOSIS — I428 Other cardiomyopathies: Secondary | ICD-10-CM | POA: Diagnosis not present

## 2017-01-10 DIAGNOSIS — Z8249 Family history of ischemic heart disease and other diseases of the circulatory system: Secondary | ICD-10-CM | POA: Diagnosis not present

## 2017-01-10 DIAGNOSIS — I110 Hypertensive heart disease with heart failure: Secondary | ICD-10-CM | POA: Diagnosis not present

## 2017-01-10 DIAGNOSIS — Z959 Presence of cardiac and vascular implant and graft, unspecified: Secondary | ICD-10-CM

## 2017-01-10 HISTORY — PX: SUBQ ICD IMPLANT: EP1223

## 2017-01-10 LAB — SURGICAL PCR SCREEN
MRSA, PCR: INVALID — AB
STAPHYLOCOCCUS AUREUS: INVALID — AB

## 2017-01-10 SURGERY — SUBQ ICD IMPLANT
Anesthesia: General

## 2017-01-10 MED ORDER — BUPIVACAINE HCL (PF) 0.25 % IJ SOLN
INTRAMUSCULAR | Status: DC | PRN
  Administered 2017-01-10: 90 mL

## 2017-01-10 MED ORDER — ONDANSETRON HCL 4 MG/2ML IJ SOLN: 4.0000 mg | Freq: Four times a day (QID) | INTRAMUSCULAR | Status: DC | PRN

## 2017-01-10 MED ORDER — SODIUM CHLORIDE 0.9 % IR SOLN
80.0000 mg | Status: AC
  Administered 2017-01-10: 80 mg

## 2017-01-10 MED ORDER — FENTANYL CITRATE (PF) 100 MCG/2ML IJ SOLN
INTRAMUSCULAR | Status: DC | PRN
  Administered 2017-01-10: 50 ug via INTRAVENOUS
  Administered 2017-01-10 (×2): 25 ug via INTRAVENOUS
  Administered 2017-01-10: 100 ug via INTRAVENOUS

## 2017-01-10 MED ORDER — SUGAMMADEX SODIUM 200 MG/2ML IV SOLN
INTRAVENOUS | Status: DC | PRN
  Administered 2017-01-10: 240 mg via INTRAVENOUS

## 2017-01-10 MED ORDER — PHENYLEPHRINE HCL 10 MG/ML IJ SOLN
INTRAVENOUS | Status: DC | PRN
  Administered 2017-01-10: 20 ug/min via INTRAVENOUS

## 2017-01-10 MED ORDER — DEXTROSE 5 % IV SOLN
3.0000 g | INTRAVENOUS | Status: AC
  Administered 2017-01-10: 3 g via INTRAVENOUS
  Filled 2017-01-10: qty 3000

## 2017-01-10 MED ORDER — OXYCODONE-ACETAMINOPHEN 5-325 MG PO TABS
ORAL_TABLET | ORAL | Status: AC
  Filled 2017-01-10: qty 2

## 2017-01-10 MED ORDER — CEFAZOLIN SODIUM-DEXTROSE 1-4 GM/50ML-% IV SOLN
1.0000 g | Freq: Four times a day (QID) | INTRAVENOUS | Status: DC
  Administered 2017-01-10: 1 g via INTRAVENOUS
  Filled 2017-01-10 (×3): qty 50

## 2017-01-10 MED ORDER — SODIUM CHLORIDE 0.9 % IR SOLN
Status: AC
  Filled 2017-01-10: qty 2

## 2017-01-10 MED ORDER — FENTANYL CITRATE (PF) 100 MCG/2ML IJ SOLN
INTRAMUSCULAR | Status: AC
  Administered 2017-01-10: 50 ug via INTRAVENOUS
  Filled 2017-01-10: qty 2

## 2017-01-10 MED ORDER — ACETAMINOPHEN 325 MG PO TABS: 325.0000 mg | ORAL_TABLET | ORAL | Status: DC | PRN

## 2017-01-10 MED ORDER — PHENYLEPHRINE HCL 10 MG/ML IJ SOLN
INTRAMUSCULAR | Status: DC | PRN
  Administered 2017-01-10: 80 ug via INTRAVENOUS

## 2017-01-10 MED ORDER — CHLORHEXIDINE GLUCONATE 4 % EX LIQD: 60.0000 mL | Freq: Once | CUTANEOUS | Status: DC

## 2017-01-10 MED ORDER — ONDANSETRON HCL 4 MG/2ML IJ SOLN
INTRAMUSCULAR | Status: DC | PRN
  Administered 2017-01-10: 4 mg via INTRAVENOUS

## 2017-01-10 MED ORDER — LIDOCAINE 2% (20 MG/ML) 5 ML SYRINGE
INTRAMUSCULAR | Status: DC | PRN
  Administered 2017-01-10: 60 mg via INTRAVENOUS

## 2017-01-10 MED ORDER — HEPARIN (PORCINE) IN NACL 2-0.9 UNIT/ML-% IJ SOLN
INTRAMUSCULAR | Status: AC | PRN
  Administered 2017-01-10: 500 mL

## 2017-01-10 MED ORDER — FENTANYL CITRATE (PF) 100 MCG/2ML IJ SOLN
25.0000 ug | INTRAMUSCULAR | Status: DC | PRN
  Administered 2017-01-10 (×3): 50 ug via INTRAVENOUS

## 2017-01-10 MED ORDER — SODIUM CHLORIDE 0.9 % IV SOLN: INTRAVENOUS | Status: AC

## 2017-01-10 MED ORDER — MIDAZOLAM HCL 5 MG/5ML IJ SOLN
INTRAMUSCULAR | Status: DC | PRN
  Administered 2017-01-10: 2 mg via INTRAVENOUS

## 2017-01-10 MED ORDER — SODIUM CHLORIDE 0.9 % IV SOLN
INTRAVENOUS | Status: DC
  Administered 2017-01-10: 07:00:00 via INTRAVENOUS

## 2017-01-10 MED ORDER — DEXAMETHASONE SODIUM PHOSPHATE 10 MG/ML IJ SOLN
INTRAMUSCULAR | Status: DC | PRN
  Administered 2017-01-10: 10 mg via INTRAVENOUS

## 2017-01-10 MED ORDER — PROPOFOL 10 MG/ML IV BOLUS
INTRAVENOUS | Status: DC | PRN
  Administered 2017-01-10: 180 mg via INTRAVENOUS

## 2017-01-10 MED ORDER — BUPIVACAINE HCL (PF) 0.25 % IJ SOLN
INTRAMUSCULAR | Status: AC
  Filled 2017-01-10: qty 90

## 2017-01-10 MED ORDER — MUPIROCIN 2 % EX OINT
TOPICAL_OINTMENT | CUTANEOUS | Status: AC
  Administered 2017-01-10: 1
  Filled 2017-01-10: qty 22

## 2017-01-10 MED ORDER — ROCURONIUM BROMIDE 10 MG/ML (PF) SYRINGE
PREFILLED_SYRINGE | INTRAVENOUS | Status: DC | PRN
  Administered 2017-01-10: 50 mg via INTRAVENOUS

## 2017-01-10 MED ORDER — FENTANYL CITRATE (PF) 100 MCG/2ML IJ SOLN
INTRAMUSCULAR | Status: AC
  Filled 2017-01-10: qty 2

## 2017-01-10 SURGICAL SUPPLY — 5 items
HEMOSTAT SURGICEL 2X4 FIBR (HEMOSTASIS) ×6 IMPLANT
ICD SUBCU MRI EMBLEM A219 (ICD Generator) ×3 IMPLANT
LEAD SUBQU EMBLEM 3501 (Pacemaker) ×2 IMPLANT
PAD DEFIB LIFELINK (PAD) ×3 IMPLANT
TRAY PACEMAKER INSERTION (PACKS) ×3 IMPLANT

## 2017-01-10 NOTE — Discharge Summary (Signed)
ELECTROPHYSIOLOGY PROCEDURE DISCHARGE SUMMARY    Patient ID: Nathan Baird,  MRN: [id number], DOB/AGE: Dec 05, 1980 36 y.o.  Admit date: 01/10/2017 Discharge date: 01/10/2017  Primary Care Physician: Sebastian Ache, PA-C Primary Cardiologist: Nahser Electrophysiologist: Graciela Husbands  Primary Discharge Diagnosis:  NICM s/p ICD implant this admission  Secondary Discharge Diagnosis:  1.  Prior TIA 2.  Chronic systolic heart failure  No Known Allergies   Procedures This Admission:  1.  Implantation of a BSX S-ICD on 01/10/17 by Dr Graciela Husbands.  See op note for full details. There were no immediate post procedure complications. 2.  CXR on 01/10/17 demonstrated no pneumothorax status post device implantation.   Brief HPI: Nathan Baird is a 36 y.o. male was referred to electrophysiology in the outpatient setting for consideration of ICD implantation.  Past medical history includes NICM, CHF, prior TIA.  The patient has persistent LV dysfunction despite guideline directed therapy.  Risks, benefits, and alternatives to ICD implantation were reviewed with the patient who wished to proceed.   Hospital Course:  The patient was admitted and underwent implantation of a BSX S-ICD with details as outlined above. He was monitored on telemetry which demonstrated sinus rhythm.  Left axillae incision was without hematoma or ecchymosis.  The device was interrogated and found to be functioning normally.  CXR was obtained and demonstrated no pneumothorax status post device implantation.  Wound care, arm mobility, and restrictions were reviewed with the patient.  The patient was examined and considered stable for discharge to home.   The patient's discharge medications include an ARB (Entresto) and beta blocker (Coreg).   Physical Exam: Vitals:   01/10/17 1557 01/10/17 1613 01/10/17 1642 01/10/17 1712  BP: 116/85 96/70 104/75 121/77  Pulse: 68 73 70 67  Resp:      Temp:      TempSrc:      SpO2: 100%  99% 98% 100%  Weight:      Height:        GEN- The patient is well appearing, alert and oriented x 3 today.   HEENT: normocephalic, atraumatic; sclera clear, conjunctiva pink; hearing intact; oropharynx clear; neck supple  Lungs- Clear to ausculation bilaterally, normal work of breathing.  No wheezes, rales, rhonchi Heart- Regular rate and rhythm, no murmurs, rubs or gallops  GI- soft, non-tender, non-distended, bowel sounds present  Extremities- no clubbing, cyanosis, or edema; DP/PT/radial pulses 2+ bilaterally MS- no significant deformity or atrophy Skin- warm and dry, no rash or lesion, left axillae incision without hematoma/ecchymosis Psych- euthymic mood, full affect Neuro- strength and sensation are intact   Labs:   Lab Results  Component Value Date   WBC 6.8 01/02/2017   HGB 14.8 01/02/2017   HCT 43.2 01/02/2017   MCV 87 01/02/2017   PLT 286 01/02/2017   No results for input(s): NA, K, CL, CO2, BUN, CREATININE, CALCIUM, PROT, BILITOT, ALKPHOS, ALT, AST, GLUCOSE in the last 168 hours.  Invalid input(s): LABALBU  Discharge Medications:  Allergies as of 01/10/2017   No Known Allergies     Medication List    STOP taking these medications   clotrimazole-betamethasone cream Commonly known as:  LOTRISONE   metroNIDAZOLE 500 MG tablet Commonly known as:  FLAGYL     TAKE these medications   atorvastatin 10 MG tablet Commonly known as:  LIPITOR Take 1 tablet (10 mg total) by mouth daily at 6 PM.   carvedilol 25 MG tablet Commonly known as:  COREG Take 1 tablet (  25 mg total) by mouth 2 (two) times daily.   sacubitril-valsartan 49-51 MG Commonly known as:  ENTRESTO Take 1 tablet by mouth 2 (two) times daily.   spironolactone 25 MG tablet Commonly known as:  ALDACTONE Take 1 tablet (25 mg total) by mouth daily.       Disposition:   Follow-up Information    [facility] Follow up on 01/20/2017.   Specialty:  Cardiology Why:  at  10:30AM Contact information: [address], [address] [place] [address] [phone]       Duke Salvia, MD Follow up on 04/22/2017.   Specialty:  Cardiology Why:  at 3:15PM  Contact information: [address] [place] [address] [phone]           Duration of Discharge Encounter: Greater than 30 minutes including physician time.  Signed, Gypsy Balsam, NP 01/10/2017 6:33 PM

## 2017-01-10 NOTE — Anesthesia Procedure Notes (Signed)
Procedure Name: Intubation Date/Time: 01/10/2017 7:52 AM Performed by: Willeen Cass P Pre-anesthesia Checklist: Patient identified, Emergency Drugs available, Suction available and Patient being monitored Patient Re-evaluated:Patient Re-evaluated prior to inductionOxygen Delivery Method: Circle System Utilized Preoxygenation: Pre-oxygenation with 100% oxygen Intubation Type: IV induction Ventilation: Mask ventilation without difficulty and Oral airway inserted - appropriate to patient size Laryngoscope Size: Mac and 3 Grade View: Grade I Tube type: Oral Tube size: 7.5 mm Number of attempts: 1 Airway Equipment and Method: Stylet and Oral airway Placement Confirmation: ETT inserted through vocal cords under direct vision,  positive ETCO2 and breath sounds checked- equal and bilateral Secured at: 23 cm Tube secured with: Tape Dental Injury: Teeth and Oropharynx as per pre-operative assessment

## 2017-01-10 NOTE — Progress Notes (Addendum)
Client back from cxr and c/o 9/10 mid-chest pain and pain med given and Dr Graciela Husbands notified

## 2017-01-10 NOTE — Progress Notes (Signed)
Dr Graciela Husbands in to see client earlier and ok to discharge home

## 2017-01-10 NOTE — Interval H&P Note (Signed)
ICD Criteria  Current LVEF:25%. Within 12 months prior to implant: Yes   Heart failure history: Yes, Class II  Cardiomyopathy history: Yes, Non-Ischemic Cardiomyopathy.  Atrial Fibrillation/Atrial Flutter: No.  Ventricular tachycardia history: No.  Cardiac arrest history: No.  History of syndromes with risk of sudden death: No.  Previous ICD: No.  Current ICD indication: Primary  PPM indication: No.   Class I or II Bradycardia indication present: No  Beta Blocker therapy for 3 or more months: Yes, prescribed.   Ace Inhibitor/ARB therapy for 3 or more months: Yes, prescribed.   History and Physical Interval Note:  01/10/2017 12:38 PM  Nathan Baird  has presented today for surgery, with the diagnosis of NICM   The various methods of treatment have been discussed with the patient and family. After consideration of risks, benefits and other options for treatment, the patient has consented to  Procedure(s): SubQ ICD Implant (N/A) as a surgical intervention .  The patient's history has been reviewed, patient examined, no change in status, stable for surgery.  I have reviewed the patient's chart and labs.  Questions were answered to the patient's satisfaction.     Sherryl Manges

## 2017-01-10 NOTE — Discharge Instructions (Signed)
° ° °  Supplemental Discharge Instructions for  Pacemaker/Defibrillator Patients  Activity No heavy lifting or vigorous activity with your left/right arm for 6 to 8 weeks.    NO DRIVING for  1 week   ; you may begin driving on  3/50/09   .  WOUND CARE - Keep the wound area clean and dry.  - The  dermabond will come off in next 10-14days and if not will remove at wound check.Keep wound dry until tomorrow evening;  No bandage is needed on the site.  DO  NOT apply any creams, oils, or ointments to the wound area. - If you notice any drainage or discharge from the wound, any swelling or bruising at the site, or you develop a fever > 101? F after you are discharged home, call the office at once. - No driving for 4 days - Wound check in office as scheduled  Special Instructions - You are still able to use cellular telephones; use the ear opposite the side where you have your pacemaker/defibrillator.  Avoid carrying your cellular phone near your device. - When traveling through airports, show security personnel your identification card to avoid being screened in the metal detectors.  Ask the security personnel to use the hand wand. - Avoid arc welding equipment, MRI testing (magnetic resonance imaging), TENS units (transcutaneous nerve stimulators).  Call the office for questions about other devices. - Avoid electrical appliances that are in poor condition or are not properly grounded. - Microwave ovens are safe to be near or to operate.

## 2017-01-10 NOTE — Progress Notes (Signed)
Client states axillary pain not present if does not move;however, states mid-chest pain continues 8/10

## 2017-01-10 NOTE — Anesthesia Preprocedure Evaluation (Signed)
Anesthesia Evaluation  Patient identified by MRN, date of birth, ID band Patient awake    Reviewed: Allergy & Precautions, H&P , NPO status , Patient's Chart, lab work & pertinent test results  Airway Mallampati: II   Neck ROM: full    Dental   Pulmonary neg pulmonary ROS,    breath sounds clear to auscultation       Cardiovascular hypertension, + Peripheral Vascular Disease and +CHF   Rhythm:regular Rate:Normal  EF 25%.  Non-ischemic CM.  Small PFO   Neuro/Psych  Neuromuscular disease CVA    GI/Hepatic   Endo/Other    Renal/GU      Musculoskeletal   Abdominal   Peds  Hematology   Anesthesia Other Findings   Reproductive/Obstetrics                             Anesthesia Physical Anesthesia Plan  ASA: IV  Anesthesia Plan: General   Post-op Pain Management:    Induction: Intravenous  PONV Risk Score and Plan: 2 and Ondansetron and Dexamethasone  Airway Management Planned: LMA  Additional Equipment:   Intra-op Plan:   Post-operative Plan:   Informed Consent: I have reviewed the patients History and Physical, chart, labs and discussed the procedure including the risks, benefits and alternatives for the proposed anesthesia with the patient or authorized representative who has indicated his/her understanding and acceptance.     Plan Discussed with: CRNA, Anesthesiologist and Surgeon  Anesthesia Plan Comments:         Anesthesia Quick Evaluation

## 2017-01-10 NOTE — Progress Notes (Signed)
Swelling noted left axilla area and area marked from cath lab and Dr Graciela Husbands notified of swelling and he will be in to see client

## 2017-01-10 NOTE — Transfer of Care (Signed)
Immediate Anesthesia Transfer of Care Note  Patient: Nathan Baird  Procedure(s) Performed: Procedure(s): SubQ ICD Implant (N/A)  Patient Location: PACU  Anesthesia Type:General  Level of Consciousness: awake, drowsy and patient cooperative  Airway & Oxygen Therapy: Patient Spontanous Breathing and Patient connected to nasal cannula oxygen  Post-op Assessment: Report given to RN, Post -op Vital signs reviewed and stable and Patient moving all extremities X 4  Post vital signs: Reviewed and stable  Last Vitals:  Vitals:   01/10/17 0534  BP: 110/76  Pulse: 69  Resp: 18  Temp: 36.9 C    Last Pain:  Vitals:   01/10/17 0534  TempSrc: Oral      Patients Stated Pain Goal: 4 (01/10/17 0622)  Complications: No apparent anesthesia complications

## 2017-01-10 NOTE — Progress Notes (Signed)
Dr Graciela Husbands in and OK to discharge home

## 2017-01-10 NOTE — H&P (Signed)
Patient Care Team: McVey, Madelaine Bhat, PA-C as PCP - General (Physician Assistant) Nahser, Deloris Ping, MD as Attending Physician (Cardiology)   HPI  Nathan Baird is a 36 y.o. male admitted for ICD-subcutaneous  He presented in 2014 with evidence of congestive heart failure. He has a history of a nonischemic cardiomyopathy.He has been on guideline directed medical therapy for some time.     DATE TEST    7/14 echo EF20-25% no evidence of LVH  8/14 MRI Ef 26% Consistent with noncompaction No scar  4/16 echo EF 25-30%   5/18 Echo EF 25-30    His father has a history of a cardiomyopathy. His mother has a history of coronary artery disease.   He has a history of recurrent TIA.he was treated with Rivaroxaban previously; he is now on aspirin   The patient denies chest pain, shortness of breath, nocturnal dyspnea, orthopnea or peripheral edema.  There have been no palpitations, lightheadedness or syncope.       Past Medical History:  Diagnosis Date  . CHF (congestive heart failure) (HCC)   . Cough    a. PFTS 09/2012: restriction probable, further examinations recommended. Saw pulm 11/2012: placed on GERD rx and short course prednisone, planned to regroup.  . CVA (cerebral vascular accident) (HCC) 01/23/2013   tPA administered; MRI brain showed small acute R MCA infarct  . History of palpitations    2x/month for several years feels heart racing  . Nonischemic cardiomyopathy (HCC)    a. Identified 10/2012 by echo EF 20-25%. b. Echo 01/2013: EF 20-25%, TEE EF 15% with severe RV dysfunction as well.  [name] PFO (patent foramen ovale)    a. By TEE 01/2013.    Past Surgical History:  Procedure Laterality Date  . NO PAST SURGERIES    . TEE WITHOUT CARDIOVERSION N/A 01/26/2013   Procedure: TRANSESOPHAGEAL ECHOCARDIOGRAM (TEE);  Surgeon: [name], MD;  Location: [facility] ENDOSCOPY;  Service: Cardiovascular;  Laterality: N/A;    Current Facility-Administered  Medications  Medication Dose Route Frequency Provider Last Rate Last Dose  . 0.9 %  sodium chloride infusion   Intravenous Continuous Gypsy Balsam K, NP 50 mL/hr at 01/10/17 [phone]    . ceFAZolin (ANCEF) 3 g in dextrose 5 % 50 mL IVPB  3 g Intravenous On Call Gypsy Balsam K, NP      . chlorhexidine (HIBICLENS) 4 % liquid 4 application  60 mL Topical Once Glory Buff, [facility] K, NP      . gentamicin (GARAMYCIN) 80 mg in sodium chloride irrigation 0.9 % 500 mL irrigation  80 mg Irrigation On Call Marily Lente, NP        No Known Allergies    Social History  Substance Use Topics  . Smoking status: Never Smoker  . Smokeless tobacco: Never Used  . Alcohol use No     Family History  Problem Relation Age of Onset  . Sarcoidosis Mother   . Heart Problems Mother        PPM implantation age 39     No current facility-administered medications on file prior to encounter.    Current Outpatient Prescriptions on File Prior to Encounter  Medication Sig Dispense Refill  . atorvastatin (LIPITOR) 10 MG tablet Take 1 tablet (10 mg total) by mouth daily at 6 PM. 90 tablet 3  . carvedilol (COREG) 25 MG tablet Take 1 tablet (25 mg total) by mouth 2 (two) times daily. 180 tablet 3  . sacubitril-valsartan (  ENTRESTO) 49-51 MG Take 1 tablet by mouth 2 (two) times daily. 180 tablet 3  . spironolactone (ALDACTONE) 25 MG tablet Take 1 tablet (25 mg total) by mouth daily. 90 tablet 3  . clotrimazole-betamethasone (LOTRISONE) cream Apply 1 application topically 2 (two) times daily. (Patient not taking: Reported on 01/07/2017) 30 g 0  . metroNIDAZOLE (FLAGYL) 500 MG tablet 2 pills then repeat in 12 h (Patient not taking: Reported on 01/07/2017) 4 tablet 0      Review of Systems negative except from HPI and PMH  Physical Exam BP 110/76   Pulse 69   Temp 98.5 F (36.9 C) (Oral)   Resp 18   Ht 6\' 2"  (1.88 m)   Wt 265 lb (120.2 kg)   SpO2 99%   BMI 34.02 kg/m  Well developed and well nourished in no acute  distress HENT normal E scleral and icterus clear Neck Supple JVP flat; carotids brisk and full Clear to ausculation Regular rate and rhythm, no murmurs gallops or rub Soft with active bowel sounds No clubbing cyanosis   Edema Alert and oriented, grossly normal motor and sensory function Skin Warm and Dry  ecg sinus 64 with narrow QRS   Assessment and  Plan  Cardiomyopathy with MRI question of non-compaction  Congestive heart failure-chronic-systolic class II  CVA    For SICD today  Have reviewed the potential benefits and risks of ICD implantation including but not limited to death  infection,  device malfunction and inappropriate shocks.  The patient express understanding  and are willing to proceed.

## 2017-01-10 NOTE — Progress Notes (Signed)
Feeling sore with some soreness with breathing  Device function normal  Wants to go home

## 2017-01-11 LAB — MRSA CULTURE: Culture: DETECTED

## 2017-01-11 NOTE — Anesthesia Postprocedure Evaluation (Signed)
Anesthesia Post Note  Patient: Nathan Baird  Procedure(s) Performed: Procedure(s) (LRB): SubQ ICD Implant (N/A)     Patient location during evaluation: PACU Anesthesia Type: General Level of consciousness: awake and alert and patient cooperative Pain management: pain level controlled Vital Signs Assessment: post-procedure vital signs reviewed and stable Respiratory status: spontaneous breathing and respiratory function stable Cardiovascular status: stable Anesthetic complications: no    Last Vitals:  Vitals:   01/10/17 1712 01/10/17 1742  BP: 121/77 113/79  Pulse: 67 72  Resp:    Temp:      Last Pain:  Vitals:   01/10/17 1845  TempSrc:   PainSc: 5    Pain Goal: Patients Stated Pain Goal: 4 (01/10/17 0622)               Marializ Ferrebee S

## 2017-01-13 ENCOUNTER — Telehealth: Payer: Self-pay | Admitting: Emergency Medicine

## 2017-01-13 MED FILL — Sodium Chloride Irrigation Soln 0.9%: Qty: 500 | Status: AC

## 2017-01-13 MED FILL — Gentamicin Sulfate Inj 40 MG/ML: INTRAMUSCULAR | Qty: 2 | Status: AC

## 2017-01-13 NOTE — Telephone Encounter (Signed)
    Patient and his fiance called to discuss concern of crepitus to his chest wall on the left side where implant was placed. He is feeling well, no CP, SOB, shocks or any other concerns. The fiance is a nurse and didn't realize that would happen. Reviewed chest xray and gas was seen on subq image associated with ICD placement- mild.  He also wanted to talk about mild headaches, feeling overall fatigued, general soreness from the surgery. Wed discussed emergency s/sx that warrant patient needs to be seen in ED tonight. Pt denied any emergently concerning symptoms-- they will call Dr. Odessa Fleming office tomorrow.  Marlon Pel, PA-C

## 2017-01-14 ENCOUNTER — Telehealth: Payer: Self-pay | Admitting: Internal Medicine

## 2017-01-14 NOTE — Telephone Encounter (Signed)
LVM for patient that letters work notes were at the front desk

## 2017-01-14 NOTE — Telephone Encounter (Signed)
Spoke with patients girlfriend and patient who reported that patient c/o of numbness at incision site. I encouraged patient that this was to be expected. Patient requested two work notes for his full time and part time jobs. I will review with SK and call patient back this afternoon. Patient was agreeable to this plan.

## 2017-01-14 NOTE — Telephone Encounter (Signed)
New message   Pt gf said that pt has some scabs in the front of his chest not where the incision is  Numbness and tingling in left arm   1. Has your device fired? no  2. Is you device beeping? no 3. Are you experiencing draining or swelling at device site? no 4. Are you calling to see if we received your device transmission? no  5. Have you passed out? no

## 2017-01-16 ENCOUNTER — Encounter: Payer: Self-pay | Admitting: Internal Medicine

## 2017-01-16 ENCOUNTER — Telehealth: Payer: Self-pay | Admitting: Internal Medicine

## 2017-01-16 NOTE — Telephone Encounter (Signed)
I left a message to call.

## 2017-01-16 NOTE — Telephone Encounter (Signed)
New message    Pt is calling asking if he can note for his job explaining his device. He states it's for his job. Please call.

## 2017-01-16 NOTE — Telephone Encounter (Signed)
Spoke with the patient. He states that his boss was concerned that he may not be able to work now due to his S-ICD implant. 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 He is not under the [employer]. He has been doing his job for 12 years and has no history of synocpe/ shocks (device placed 01/10/17). 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 He is requesting a letter from [name] stating his device was placed a precaution due to his medical diagnosis to help support his return to his regular job after about 5 weeks. I advised I will review with [name] and call him back later today or tomorrow morning. He wants to pick up any documentation.

## 2017-01-17 NOTE — Telephone Encounter (Signed)
I left a message for the patient to call. [name] has done a letter for his job and this has been placed at the front desk for pick up.

## 2017-01-20 ENCOUNTER — Encounter: Payer: Self-pay | Admitting: *Deleted

## 2017-01-20 ENCOUNTER — Ambulatory Visit (INDEPENDENT_AMBULATORY_CARE_PROVIDER_SITE_OTHER): Payer: Commercial Managed Care - HMO | Admitting: *Deleted

## 2017-01-20 DIAGNOSIS — I5022 Chronic systolic (congestive) heart failure: Secondary | ICD-10-CM | POA: Diagnosis not present

## 2017-01-20 DIAGNOSIS — I428 Other cardiomyopathies: Secondary | ICD-10-CM

## 2017-01-20 LAB — CUP PACEART INCLINIC DEVICE CHECK
Implantable Lead Implant Date: 20180706
Implantable Lead Location: 753862
Implantable Lead Model: 3401
Implantable Lead Serial Number: 114025
Implantable Pulse Generator Implant Date: 20180706
MDC IDC PG SERIAL: 228369
MDC IDC SESS DTM: 20180716120707

## 2017-01-20 NOTE — Progress Notes (Signed)
Subcutaneous ICD wound check in clinic. Attempted to remove dermabond from left thorax incision- incision edges unapproximated, 3 steri-strips applied. Dermabond removed from lower sternal incision, some unapproximation on both lateral edges, 4 steri-strips applied. Patient will return to device clinic next Tuesday (24th) for wound check only. 0 untreated episodes; 0 treated episodes; 0 shocks delivered. 0% AF. Electrode impedance status okay. No programming changes. Secondary configuration programmed. Shock zone: 250bpm, conditional shock zone 220bpm. Remaining longevity to ERI 100%. ROV with [name] 04/22/17.  Nathan Baird is concerned about pain management- he did not like how the percocet made him feel but is c/o "sharp, stabbing, itching" pains around device. I explained that this is likely nerve pain and that he can take tylenol for any discomfort. He requests a Rx for tylenol (for financial reasons). I wasn't sure if tylenol could even be prescribed but that I would run this by [name]. 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 He will follow up if work needs any documentation of device status/condition.

## 2017-01-21 NOTE — Telephone Encounter (Signed)
I spoke with the patient today - he did not get my message from last week- I informed him his letter was ready for pick up for his job.

## 2017-01-24 ENCOUNTER — Emergency Department (HOSPITAL_BASED_OUTPATIENT_CLINIC_OR_DEPARTMENT_OTHER)
Admission: EM | Admit: 2017-01-24 | Discharge: 2017-01-24 | Disposition: A | Discharge status: Home / Self Care | Payer: Commercial Managed Care - HMO | Attending: Emergency Medicine | Admitting: Emergency Medicine

## 2017-01-24 ENCOUNTER — Emergency Department (HOSPITAL_BASED_OUTPATIENT_CLINIC_OR_DEPARTMENT_OTHER): Payer: Commercial Managed Care - HMO

## 2017-01-24 ENCOUNTER — Encounter (HOSPITAL_BASED_OUTPATIENT_CLINIC_OR_DEPARTMENT_OTHER): Payer: Self-pay

## 2017-01-24 DIAGNOSIS — I5022 Chronic systolic (congestive) heart failure: Secondary | ICD-10-CM | POA: Insufficient documentation

## 2017-01-24 DIAGNOSIS — I110 Hypertensive heart disease with heart failure: Secondary | ICD-10-CM | POA: Insufficient documentation

## 2017-01-24 DIAGNOSIS — Y712 Prosthetic and other implants, materials and accessory cardiovascular devices associated with adverse incidents: Secondary | ICD-10-CM | POA: Insufficient documentation

## 2017-01-24 DIAGNOSIS — T814XXA Infection following a procedure, initial encounter: Secondary | ICD-10-CM | POA: Diagnosis not present

## 2017-01-24 DIAGNOSIS — Z8673 Personal history of transient ischemic attack (TIA), and cerebral infarction without residual deficits: Secondary | ICD-10-CM | POA: Diagnosis not present

## 2017-01-24 DIAGNOSIS — IMO0001 Reserved for inherently not codable concepts without codable children: Secondary | ICD-10-CM

## 2017-01-24 DIAGNOSIS — R079 Chest pain, unspecified: Secondary | ICD-10-CM | POA: Diagnosis present

## 2017-01-24 DIAGNOSIS — Z79899 Other long term (current) drug therapy: Secondary | ICD-10-CM | POA: Diagnosis not present

## 2017-01-24 MED ORDER — CEPHALEXIN 500 MG PO CAPS: 500.0000 mg | ORAL_CAPSULE | Freq: Four times a day (QID) | ORAL | 0 refills | Status: DC

## 2017-01-24 NOTE — ED Provider Notes (Signed)
[facility]   CSN: [id number] Arrival date & time: 01/24/17  2206 By signing my name below, I, Levon Hedger, attest that this documentation has been prepared under the direction and in the presence of Geoffery Lyons, MD . Electronically Signed: Levon Hedger, Scribe. 01/24/2017. 11:37 PM.   History   Chief Complaint Chief Complaint  Patient presents with  . Surgical pain   HPI Nathan Baird is a 36 y.o. male with a history of CHF, CVA and nonischemic cardiomyopathy who presents to the Emergency Department complaining of sudden onset, severe pain to left lateral chest at SUBQ ICD surgical site onset tonight. Pt reports associated swelling to the area. Pain is exacerbated by direct palpation and movement. Pt had defibrillator placed on 01/10/17 and has not had any issues with this until tonight.He has an appointment with cardiology for a wound check next week. He denies any issues with the defibrillator.  He denies any trauma or injury to the area. Pt denies any drainage and has  no other acute complaints or associated symptoms at this time.    The history is provided by the patient. No language interpreter was used.   Past Medical History:  Diagnosis Date  . CHF (congestive heart failure) (HCC)   . Cough    a. PFTS 09/2012: restriction probable, further examinations recommended. Saw pulm 11/2012: placed on GERD rx and short course prednisone, planned to regroup.  . CVA (cerebral vascular accident) (HCC) 01/23/2013   tPA administered; MRI brain showed small acute R MCA infarct  . History of palpitations    2x/month for several years feels heart racing  . Nonischemic cardiomyopathy (HCC)    a. Identified 10/2012 by echo EF 20-25%. b. Echo 01/2013: EF 20-25%, TEE EF 15% with severe RV dysfunction as well.  [name] PFO (patent foramen ovale)    a. By TEE 01/2013.    Patient Active Problem List   Diagnosis Date Noted  . NICM (nonischemic cardiomyopathy) (HCC) 01/10/2017    . Trapezius muscle strain 11/24/2014  . Right shoulder pain 08/24/2014  . TIA (transient ischemic attack) 05/20/2014  . History of CVA (cerebrovascular accident)   . Essential hypertension   . HLD (hyperlipidemia)   . Encounter for long-term (current) use of high-risk medication 01/27/2013  . Patent foramen ovale with right to left shunt 01/27/2013  . Stroke, acute, embolic (HCC) 01/25/2013  . Cough 10/20/2012  . Chronic systolic congestive heart failure (HCC) 10/20/2012  . Penile pain 05/19/2012  . ATTENTION DEFICIT, W/HYPERACTIVITY 09/04/2006    Past Surgical History:  Procedure Laterality Date  . NO PAST SURGERIES    . SUBQ ICD IMPLANT N/A 01/10/2017   Procedure: SubQ ICD Implant;  Surgeon: Duke Salvia, MD;  [facility] INVASIVE CV LAB;  Service: Cardiovascular;  Laterality: N/A;  . TEE WITHOUT CARDIOVERSION N/A 01/26/2013   Procedure: TRANSESOPHAGEAL ECHOCARDIOGRAM (TEE);  Surgeon: [name], MD;  Location: [facility] ENDOSCOPY;  Service: Cardiovascular;  Laterality: N/A;     Home Medications    Prior to Admission medications   Medication Sig Start Date End Date Taking? Authorizing Provider  atorvastatin (LIPITOR) 10 MG tablet Take 1 tablet (10 mg total) by mouth daily at 6 PM. 10/24/16   Nahser, Deloris Ping, MD  carvedilol (COREG) 25 MG tablet Take 1 tablet (25 mg total) by mouth 2 (two) times daily. 10/24/16   Nahser, Deloris Ping, MD  sacubitril-valsartan (ENTRESTO) 49-51 MG Take 1 tablet by mouth 2 (two) times daily. 10/24/16  Nahser, Deloris Ping, MD  spironolactone (ALDACTONE) 25 MG tablet Take 1 tablet (25 mg total) by mouth daily. 10/24/16   Nahser, Deloris Ping, MD    Family History Family History  Problem Relation Age of Onset  . Sarcoidosis Mother   . Heart Problems Mother        PPM implantation age 38    Social History Social History  Substance Use Topics  . Smoking status: Never Smoker  . Smokeless tobacco: Never Used  . Alcohol use No    Allergies   Patient has  no known allergies.  Review of Systems Review of Systems All systems reviewed and are negative for acute change except as noted in the HPI.  Physical Exam Updated Vital Signs BP 109/88 (BP Location: Right Arm)   Pulse 96   Temp 98.9 F (37.2 C) (Oral)   Resp 18   Ht 6\' 2"  (1.88 m)   Wt 265 lb (120.2 kg)   SpO2 98%   BMI 34.02 kg/m   Physical Exam  Constitutional: He is oriented to person, place, and time. He appears well-developed and well-nourished.  HENT:  Head: Normocephalic.  Eyes: EOM are normal.  Neck: Normal range of motion.  Pulmonary/Chest: Effort normal. He exhibits tenderness.  The surgical incision to the left lateral chest wall appears to be healing well. There is some tenderness to palpation, however no significant redness or warmth.   Abdominal: He exhibits no distension.  Musculoskeletal: Normal range of motion.  Neurological: He is alert and oriented to person, place, and time.  Psychiatric: He has a normal mood and affect.  Nursing note and vitals reviewed.  ED Treatments / Results  DIAGNOSTIC STUDIES:  Oxygen Saturation is 98% on RA, normal by my interpretation.    COORDINATION OF CARE:  11:34 PM Discussed treatment plan with pt at bedside and pt agreed to plan.   Labs (all labs ordered are listed, but only abnormal results are displayed) Labs Reviewed - No data to display  EKG  EKG Interpretation None      Radiology Dg Chest 2 View  Result Date: 01/24/2017 CLINICAL DATA:  36 y/o M; increased pain and swelling over the incision site of defibrillator. EXAM: CHEST  2 VIEW COMPARISON:  01/10/2017 chest radiograph FINDINGS: Stable mildly enlarged cardiac silhouette. Clear lungs. No pleural effusion or pneumothorax. Single lead AICD projects over the left lateral chest wall. No acute osseous abnormality is evident. IMPRESSION: Stable mild cardiomegaly.  Clear lungs. Electronically Signed   By: Mitzi Hansen M.D.   On: 01/24/2017 22:39     Procedures Procedures (including critical care time)  Medications Ordered in ED Medications - No data to display   Initial Impression / Assessment and Plan / ED Course  I have reviewed the triage vital signs and the nursing notes.  Pertinent labs & imaging results that were available during my care of the patient were reviewed by me and considered in my medical decision making (see chart for details).  Patient with increasing discomfort over the recently placed defibrillator site. I no purulent discharge or significant erythema. He will be started on antibiotic for presumed infection and is to follow-up with his cardiologist if not improving in the next 2-3 days.  Final Clinical Impressions(s) / ED Diagnoses   Final diagnoses:  None    New Prescriptions New Prescriptions   No medications on file   I personally performed the services described in this documentation, which was scribed in my presence. The recorded  information has been reviewed and is accurate.        Geoffery Lyons, MD 01/25/17 [phone]

## 2017-01-24 NOTE — ED Notes (Signed)
Pt verbalizes understanding of d/c instructions and denies any further needs at this time. 

## 2017-01-24 NOTE — ED Triage Notes (Signed)
Pt c/o pain surrounding where his defibrillator was placed on 7/6, pain started at 1800 tonight while he was working, appears swollen to area and is tender to touch.  Incision is not draining and appears normal.  Pt denies any cardiac related pain and denies any activity with his implant

## 2017-01-24 NOTE — Discharge Instructions (Signed)
Keflex as prescribed.  Follow-up with your surgeon on Tuesday as scheduled, and go to the  if you develop increased pain, redness, swelling, fevers, or other new and concerning symptoms.

## 2017-01-28 ENCOUNTER — Ambulatory Visit (INDEPENDENT_AMBULATORY_CARE_PROVIDER_SITE_OTHER): Payer: Self-pay | Admitting: *Deleted

## 2017-01-28 DIAGNOSIS — I428 Other cardiomyopathies: Secondary | ICD-10-CM

## 2017-01-28 MED ORDER — IBUPROFEN 800 MG PO TABS: 800.0000 mg | ORAL_TABLET | Freq: Three times a day (TID) | ORAL | 0 refills | Status: DC | PRN

## 2017-01-28 NOTE — Progress Notes (Signed)
Reassessment of wound~ dermabond removed, incision well healed~ edges approximated. No redness or swelling noted, pt c/o of pain that started on Saturday, information reviewed with [name] recommended Ibuprofenand f/u next week. Pt agreeable to apt on 8/2 at 1:15pm.

## 2017-01-30 ENCOUNTER — Ambulatory Visit: Payer: Self-pay | Admitting: Physician Assistant

## 2017-02-06 ENCOUNTER — Ambulatory Visit (INDEPENDENT_AMBULATORY_CARE_PROVIDER_SITE_OTHER): Payer: Commercial Managed Care - HMO | Admitting: Internal Medicine

## 2017-02-06 ENCOUNTER — Encounter: Payer: Self-pay | Admitting: Internal Medicine

## 2017-02-06 VITALS — BP 102/80 | HR 70 | Ht 75.0 in | Wt 277.0 lb

## 2017-02-06 DIAGNOSIS — I5022 Chronic systolic (congestive) heart failure: Secondary | ICD-10-CM | POA: Diagnosis not present

## 2017-02-06 DIAGNOSIS — I428 Other cardiomyopathies: Secondary | ICD-10-CM | POA: Diagnosis not present

## 2017-02-06 DIAGNOSIS — Z95810 Presence of automatic (implantable) cardiac defibrillator: Secondary | ICD-10-CM

## 2017-02-06 LAB — CUP PACEART INCLINIC DEVICE CHECK
Implantable Pulse Generator Implant Date: 20180706
MDC IDC LEAD IMPLANT DT: 20180706
MDC IDC LEAD LOCATION: 753862
MDC IDC LEAD SERIAL: 114025
MDC IDC SESS DTM: 20180802143440
Pulse Gen Serial Number: 228369

## 2017-02-06 MED ORDER — IBUPROFEN 800 MG PO TABS: 800.0000 mg | ORAL_TABLET | Freq: Three times a day (TID) | ORAL | 1 refills | Status: AC | PRN

## 2017-02-06 NOTE — Progress Notes (Signed)
Patient Care Team: McVey, Madelaine Bhat, PA-C as PCP - General (Physician Assistant) Nahser, Deloris Ping, MD as Attending Physician (Cardiology)   HPI  Nathan Baird is a 36 y.o. male Seen in follow-up for ICD implanted for primary prevention in the setting of nonischemic cardiomyopathy since [name] dysfunction and modest class II heart failure.  He has healed well; he complains of pain around the site. There've been times worse than hypersensitive to touch. Shortness of breath is stable. He has had no edema.  He has a history of a prior TIA currently is treated with aspirin.   Date Cr K Mg  6/18  1.15 4.0             Past Medical History:  Diagnosis Date  . CHF (congestive heart failure) (HCC)   . Cough    a. PFTS 09/2012: restriction probable, further examinations recommended. Saw pulm 11/2012: placed on GERD rx and short course prednisone, planned to regroup.  . CVA (cerebral vascular accident) (HCC) 01/23/2013   tPA administered; MRI brain showed small acute R MCA infarct  . History of palpitations    2x/month for several years feels heart racing  . Nonischemic cardiomyopathy (HCC)    a. Identified 10/2012 by echo EF 20-25%. b. Echo 01/2013: EF 20-25%, TEE EF 15% with severe RV dysfunction as well.  [name] PFO (patent foramen ovale)    a. By TEE 01/2013.    Past Surgical History:  Procedure Laterality Date  . NO PAST SURGERIES    . SUBQ ICD IMPLANT N/A 01/10/2017   Procedure: SubQ ICD Implant;  Surgeon: Duke Salvia, MD;  Location: [facility] INVASIVE CV LAB;  Service: Cardiovascular;  Laterality: N/A;  . TEE WITHOUT CARDIOVERSION N/A 01/26/2013   Procedure: TRANSESOPHAGEAL ECHOCARDIOGRAM (TEE);  Surgeon: [name], MD;  Location: [facility] ENDOSCOPY;  Service: Cardiovascular;  Laterality: N/A;    Current Outpatient Prescriptions  Medication Sig Dispense Refill  . atorvastatin (LIPITOR) 10 MG tablet Take 1 tablet (10 mg total) by mouth daily at 6 PM. 90 tablet 3  .  carvedilol (COREG) 25 MG tablet Take 1 tablet (25 mg total) by mouth 2 (two) times daily. 180 tablet 3  . ibuprofen (ADVIL,MOTRIN) 800 MG tablet Take 1 tablet (800 mg total) by mouth every 8 (eight) hours as needed for mild pain. 30 tablet 1  . sacubitril-valsartan (ENTRESTO) 49-51 MG Take 1 tablet by mouth 2 (two) times daily. 180 tablet 3  . spironolactone (ALDACTONE) 25 MG tablet Take 1 tablet (25 mg total) by mouth daily. 90 tablet 3   No current facility-administered medications for this visit.     No Known Allergies    Review of Systems negative except from HPI and PMH  Physical Exam BP 102/80   Pulse 70   Ht 6\' 3"  (1.905 m)   Wt 277 lb (125.6 kg)   SpO2 98%   BMI 34.62 kg/m  Well developed and well nourished in no acute distress HENT normal E scleral and icterus clear Neck Supple JVP flat; carotids brisk and full Clear to ausculation Device pocket well healed; without hematoma or erythema.  There is no tethering  Regular rate and rhythm, no murmurs gallops or rub Soft with active bowel sounds No clubbing cyanosis  } Edema Alert and oriented, grossly normal motor and sensory function Skin Warm and Dry    Assessment and  Plan  NICM  SubQ ICD  TIA  Euvolemic continue current meds  poket well healed        Current medicines are reviewed at length with the patient today .  The patient does not  have concerns regarding medicines.

## 2017-02-06 NOTE — Patient Instructions (Addendum)
Medication Instructions:  Your physician recommends that you continue on your current medications as directed. Please refer to the Current Medication list given to you today.  If you need a refill on your cardiac medications before your next appointment, please call your pharmacy.   Labwork: None ordered  Testing/Procedures: None ordered  Follow-Up: Keep your currently shceduled office visit on 04/22/2017 at 3:15 p.m. with [name].  Thank you for choosing [facility]!!

## 2017-03-07 ENCOUNTER — Emergency Department (HOSPITAL_BASED_OUTPATIENT_CLINIC_OR_DEPARTMENT_OTHER)
Admission: EM | Admit: 2017-03-07 | Discharge: 2017-03-08 | Disposition: A | Discharge status: Home / Self Care | Payer: 59 | Attending: Emergency Medicine | Admitting: Emergency Medicine

## 2017-03-07 DIAGNOSIS — I110 Hypertensive heart disease with heart failure: Secondary | ICD-10-CM | POA: Insufficient documentation

## 2017-03-07 DIAGNOSIS — I5022 Chronic systolic (congestive) heart failure: Secondary | ICD-10-CM | POA: Insufficient documentation

## 2017-03-07 DIAGNOSIS — Z202 Contact with and (suspected) exposure to infections with a predominantly sexual mode of transmission: Secondary | ICD-10-CM | POA: Diagnosis present

## 2017-03-07 DIAGNOSIS — Z79899 Other long term (current) drug therapy: Secondary | ICD-10-CM | POA: Insufficient documentation

## 2017-03-08 ENCOUNTER — Encounter (HOSPITAL_BASED_OUTPATIENT_CLINIC_OR_DEPARTMENT_OTHER): Payer: Self-pay | Admitting: *Deleted

## 2017-03-08 LAB — URINALYSIS, COMPLETE (UACMP) WITH MICROSCOPIC
Bacteria, UA: NONE SEEN
Bilirubin Urine: NEGATIVE
Glucose, UA: NEGATIVE mg/dL
HGB URINE DIPSTICK: NEGATIVE
Ketones, ur: NEGATIVE mg/dL
Leukocytes, UA: NEGATIVE
Nitrite: NEGATIVE
Protein, ur: NEGATIVE mg/dL
Specific Gravity, Urine: 1.025 (ref 1.005–1.030)
Squamous Epithelial / LPF: NONE SEEN
pH: 6 (ref 5.0–8.0)

## 2017-03-08 LAB — URINALYSIS, ROUTINE W REFLEX MICROSCOPIC
BILIRUBIN URINE: NEGATIVE
Glucose, UA: NEGATIVE mg/dL
HGB URINE DIPSTICK: NEGATIVE
Ketones, ur: NEGATIVE mg/dL
Leukocytes, UA: NEGATIVE
Nitrite: NEGATIVE
Protein, ur: NEGATIVE mg/dL
SPECIFIC GRAVITY, URINE: 1.025 (ref 1.005–1.030)
pH: 6 (ref 5.0–8.0)

## 2017-03-08 MED ORDER — AZITHROMYCIN 250 MG PO TABS
1000.0000 mg | ORAL_TABLET | Freq: Once | ORAL | Status: AC
  Administered 2017-03-08: 1000 mg via ORAL
  Filled 2017-03-08: qty 4

## 2017-03-08 MED ORDER — CEFTRIAXONE SODIUM 250 MG IJ SOLR
250.0000 mg | Freq: Once | INTRAMUSCULAR | Status: AC
  Administered 2017-03-08: 250 mg via INTRAMUSCULAR
  Filled 2017-03-08: qty 250

## 2017-03-08 NOTE — ED Triage Notes (Signed)
Pt request STD check

## 2017-03-08 NOTE — ED Provider Notes (Signed)
[facility]   CSN: [id number] Arrival date & time: 03/07/17  2347     History   Chief Complaint Chief Complaint  Patient presents with  . Exposure to STD    HPI Nathan Baird is a 36 y.o. male.  Patient is a 36 year old male presenting for possible STD exposure. He reports burning to his penis for the past 2 days. He reports a new sexual contact. He denies any dysuria or discharge.   The history is provided by the patient.  Exposure to STD  This is a new problem. The current episode started more than 2 days ago. The problem occurs constantly. The problem has been gradually worsening. Nothing aggravates the symptoms. Nothing relieves the symptoms.    Past Medical History:  Diagnosis Date  . CHF (congestive heart failure) (HCC)   . Cough    a. PFTS 09/2012: restriction probable, further examinations recommended. Saw pulm 11/2012: placed on GERD rx and short course prednisone, planned to regroup.  . CVA (cerebral vascular accident) (HCC) 01/23/2013   tPA administered; MRI brain showed small acute R MCA infarct  . History of palpitations    2x/month for several years feels heart racing  . Nonischemic cardiomyopathy (HCC)    a. Identified 10/2012 by echo EF 20-25%. b. Echo 01/2013: EF 20-25%, TEE EF 15% with severe RV dysfunction as well.  [name] PFO (patent foramen ovale)    a. By TEE 01/2013.    Patient Active Problem List   Diagnosis Date Noted  . NICM (nonischemic cardiomyopathy) (HCC) 01/10/2017  . Trapezius muscle strain 11/24/2014  . Right shoulder pain 08/24/2014  . TIA (transient ischemic attack) 05/20/2014  . History of CVA (cerebrovascular accident)   . Essential hypertension   . HLD (hyperlipidemia)   . Encounter for long-term (current) use of high-risk medication 01/27/2013  . Patent foramen ovale with right to left shunt 01/27/2013  . Stroke, acute, embolic (HCC) 01/25/2013  . Cough 10/20/2012  . Chronic systolic congestive heart failure  (HCC) 10/20/2012  . Penile pain 05/19/2012  . ATTENTION DEFICIT, W/HYPERACTIVITY 09/04/2006    Past Surgical History:  Procedure Laterality Date  . NO PAST SURGERIES    . SUBQ ICD IMPLANT N/A 01/10/2017   Procedure: SubQ ICD Implant;  Surgeon: Duke Salvia, MD;  Location: [facility] INVASIVE CV LAB;  Service: Cardiovascular;  Laterality: N/A;  . TEE WITHOUT CARDIOVERSION N/A 01/26/2013   Procedure: TRANSESOPHAGEAL ECHOCARDIOGRAM (TEE);  Surgeon: [name], MD;  Location: [facility] ENDOSCOPY;  Service: Cardiovascular;  Laterality: N/A;       Home Medications    Prior to Admission medications   Medication Sig Start Date End Date Taking? Authorizing Provider  atorvastatin (LIPITOR) 10 MG tablet Take 1 tablet (10 mg total) by mouth daily at 6 PM. 10/24/16   Nahser, Deloris Ping, MD  carvedilol (COREG) 25 MG tablet Take 1 tablet (25 mg total) by mouth 2 (two) times daily. 10/24/16   Nahser, Deloris Ping, MD  sacubitril-valsartan (ENTRESTO) 49-51 MG Take 1 tablet by mouth 2 (two) times daily. 10/24/16   Nahser, Deloris Ping, MD  spironolactone (ALDACTONE) 25 MG tablet Take 1 tablet (25 mg total) by mouth daily. 10/24/16   Nahser, Deloris Ping, MD    Family History Family History  Problem Relation Age of Onset  . Sarcoidosis Mother   . Heart Problems Mother        PPM implantation age 70    Social History Social History  Substance Use  Topics  . Smoking status: Never Smoker  . Smokeless tobacco: Never Used  . Alcohol use No     Allergies   Patient has no known allergies.   Review of Systems Review of Systems  All other systems reviewed and are negative.    Physical Exam Updated Vital Signs BP 113/81 (BP Location: Left Arm)   Pulse 74   Temp 98.5 F (36.9 C) (Oral)   Resp 18   Ht 6\' 3"  (1.905 m)   Wt 121.1 kg (267 lb)   SpO2 98%   BMI 33.37 kg/m   Physical Exam  Constitutional: He is oriented to person, place, and time. He appears well-developed and well-nourished. No distress.  HENT:   Head: Normocephalic and atraumatic.  Mouth/Throat: Oropharynx is clear and moist.  Neck: Normal range of motion. Neck supple.  Pulmonary/Chest: Effort normal.  Musculoskeletal: Normal range of motion. He exhibits no edema.  Neurological: He is alert and oriented to person, place, and time. Coordination normal.  Skin: Skin is warm and dry. He is not diaphoretic.  Nursing note and vitals reviewed.    ED Treatments / Results  Labs (all labs ordered are listed, but only abnormal results are displayed) Labs Reviewed  URINALYSIS, ROUTINE W REFLEX MICROSCOPIC  URINALYSIS, ROUTINE W REFLEX MICROSCOPIC  GC/CHLAMYDIA PROBE AMP ([place]) NOT AT [facility]    EKG  EKG Interpretation None       Radiology No results found.  Procedures Procedures (including critical care time)  Medications Ordered in ED Medications  cefTRIAXone (ROCEPHIN) injection 250 mg (not administered)  azithromycin (ZITHROMAX) tablet 1,000 mg (not administered)     Initial Impression / Assessment and Plan / ED Course  I have reviewed the triage vital signs and the nursing notes.  Pertinent labs & imaging results that were available during my care of the patient were reviewed by me and considered in my medical decision making (see chart for details).  GC and Chlamydia pending. We'll presumptively treat with Rocephin and Zithromax. Urinalysis otherwise clear.  Final Clinical Impressions(s) / ED Diagnoses   Final diagnoses:  None    New Prescriptions New Prescriptions   No medications on file     Geoffery Lyons, MD 03/08/17 [phone]

## 2017-03-08 NOTE — Discharge Instructions (Signed)
We will call you if your culture indicates you require further treatment or action.

## 2017-03-11 NOTE — ED Notes (Signed)
Pt. Called and left a message on this RN's office phone for a return call concerning results.  Called the provided phone number, message left

## 2017-03-13 LAB — GC/CHLAMYDIA PROBE AMP (~~LOC~~) NOT AT ARMC
Chlamydia: NEGATIVE
Neisseria Gonorrhea: NEGATIVE

## 2017-04-22 ENCOUNTER — Encounter: Payer: Self-pay | Admitting: Internal Medicine

## 2017-05-12 NOTE — Progress Notes (Signed)
Electrophysiology Office Note Date: 05/14/2017  ID:  Nathan Simonsony J Baird, DOB 03/02/1981, MRN [id number]  PCP: Sebastian AcheMcVey, Elizabeth Whitney, PA-C Primary Cardiologist: Nahser Electrophysiologist: Graciela HusbandsKlein  CC: Routine ICD follow-up  Nathan Simonsony J Baird is a 36 y.o. male seen today for Dr Graciela HusbandsKlein.  He presents today for routine electrophysiology followup.  Since last being seen in our clinic, the patient reports doing reasonably well. He works outside and has developed tension headaches as well as productive cough.   He denies chest pain, palpitations, dyspnea, PND, orthopnea, nausea, vomiting, dizziness, syncope, edema, weight gain, or early satiety.  He has not had ICD shocks.   Device History: BSX S-ICD implanted 2018 for NICM History of appropriate therapy: No History of AAD therapy: No   Past Medical History:  Diagnosis Date  . CHF (congestive heart failure) (HCC)   . Cough    a. PFTS 09/2012: restriction probable, further examinations recommended. Saw pulm 11/2012: placed on GERD rx and short course prednisone, planned to regroup.  . CVA (cerebral vascular accident) (HCC) 01/23/2013   tPA administered; MRI brain showed small acute R MCA infarct  . History of palpitations    2x/month for several years feels heart racing  . Nonischemic cardiomyopathy (HCC)    a. Identified 10/2012 by echo EF 20-25%. b. Echo 01/2013: EF 20-25%, TEE EF 15% with severe RV dysfunction as well.  [name]. PFO (patent foramen ovale)    a. By TEE 01/2013.   Past Surgical History:  Procedure Laterality Date  . NO PAST SURGERIES      Current Outpatient Medications  Medication Sig Dispense Refill  . atorvastatin (LIPITOR) 10 MG tablet Take 1 tablet (10 mg total) by mouth daily at 6 PM. 90 tablet 3  . carvedilol (COREG) 25 MG tablet Take 1 tablet (25 mg total) by mouth 2 (two) times daily. 180 tablet 3  . sacubitril-valsartan (ENTRESTO) 49-51 MG Take 1 tablet by mouth 2 (two) times daily. 180 tablet 3  . spironolactone  (ALDACTONE) 25 MG tablet Take 1 tablet (25 mg total) by mouth daily. 90 tablet 3   No current facility-administered medications for this visit.     Allergies:   Patient has no known allergies.   Social History: Social History   Socioeconomic History  . Marital status: Single    Spouse name: Not on file  . Number of children: Not on file  . Years of education: Not on file  . Highest education level: Not on file  Social Needs  . Financial resource strain: Not on file  . Food insecurity - worry: Not on file  . Food insecurity - inability: Not on file  . Transportation needs - medical: Not on file  . Transportation needs - non-medical: Not on file  Occupational History  . Not on file  Tobacco Use  . Smoking status: Never Smoker  . Smokeless tobacco: Never Used  Substance and Sexual Activity  . Alcohol use: No  . Drug use: No  . Sexual activity: Not on file  Other Topics Concern  . Not on file  Social History Narrative   Marital status: single     Children: 4 children (111, 554, 2, 53month old)      [place]      Employment: works for [place] [employer]      Exercise: basketball every Friday night.    Family History: Family History  Problem Relation Age of Onset  . Sarcoidosis Mother   .  Heart Problems Mother        PPM implantation age 38    Review of Systems: All other systems reviewed and are otherwise negative except as noted above.   Physical Exam: VS:  BP 106/80   Pulse 68   Ht 6\' 3"  (1.905 m)   Wt 282 lb 12.8 oz (128.3 kg)   BMI 35.35 kg/m  , BMI Body mass index is 35.35 kg/m.  GEN- The patient is well appearing, alert and oriented x 3 today.   HEENT: normocephalic, atraumatic; sclera clear, conjunctiva pink; hearing intact; oropharynx clear; neck supple  Lungs- Clear to ausculation bilaterally, normal work of breathing.  No wheezes, rales, rhonchi Heart- Regular rate and rhythm  GI- soft, non-tender,  non-distended, bowel sounds present  Extremities- no clubbing, cyanosis, or edema  MS- no significant deformity or atrophy Skin- warm and dry, no rash or lesion; ICD pocket well healed Psych- euthymic mood, full affect Neuro- strength and sensation are intact  ICD interrogation- reviewed in detail today,  See PACEART report  EKG:  EKG is not ordered today.  Recent Labs: 10/24/2016: ALT 18 01/02/2017: BUN 14; Creatinine, Ser 1.15; Hemoglobin 14.8; Platelets 286; Potassium 4.0; Sodium 141   Wt Readings from Last 3 Encounters:  05/14/17 282 lb 12.8 oz (128.3 kg)  03/07/17 267 lb (121.1 kg)  02/06/17 277 lb (125.6 kg)     Other studies Reviewed: Additional studies/ records that were reviewed today include: Dr Odessa Fleming office notes   Assessment and Plan:  1.  Chronic systolic dysfunction euvolemic today Stable on an appropriate medical regimen Normal ICD function See Pace Art report No changes today  Recommended plain Claritin and Mucinex to help with likely sinus symptoms   Current medicines are reviewed at length with the patient today.   The patient does not have concerns regarding his medicines.  The following changes were made today:  none  Labs/ tests ordered today include: none Orders Placed This Encounter  Procedures  . CUP PACEART INCLINIC DEVICE CHECK     Disposition:   Follow up with [facility], Dr Graciela Husbands 9 months      Signed, Gypsy Balsam, NP 05/14/2017 10:42 AM  [facility] [address] [place] [address] [phone] (office) [phone] (fax)

## 2017-05-14 ENCOUNTER — Ambulatory Visit (INDEPENDENT_AMBULATORY_CARE_PROVIDER_SITE_OTHER): Payer: 59 | Admitting: Nurse Practitioner

## 2017-05-14 ENCOUNTER — Encounter: Payer: Self-pay | Admitting: Nurse Practitioner

## 2017-05-14 VITALS — BP 106/80 | HR 68 | Ht 75.0 in | Wt 282.8 lb

## 2017-05-14 DIAGNOSIS — I428 Other cardiomyopathies: Secondary | ICD-10-CM | POA: Diagnosis not present

## 2017-05-14 DIAGNOSIS — I5022 Chronic systolic (congestive) heart failure: Secondary | ICD-10-CM

## 2017-05-14 LAB — CUP PACEART INCLINIC DEVICE CHECK
Implantable Lead Implant Date: 20180706
Implantable Lead Location: 753862
Implantable Lead Model: 3401
Implantable Lead Serial Number: 114025
MDC IDC PG IMPLANT DT: 20180706
MDC IDC PG SERIAL: 228369
MDC IDC SESS DTM: 20181107100542

## 2017-05-14 NOTE — Patient Instructions (Addendum)
Medication Instructions:   Your physician recommends that you continue on your current medications as directed. Please refer to the Current Medication list given to you today.   If you need a refill on your cardiac medications before your next appointment, please call your pharmacy.  Labwork: NONE ORDERED  TODAY    Testing/Procedures: NONE ORDERED  TODAY    Follow-Up: Your physician wants you to follow-up in:  IN 9 MONTHS WITH DR Logan Bores will receive a reminder letter in the mail two months in advance. If you don't receive a letter, please call our office to schedule the follow-up appointment.      Any Other Special Instructions Will Be Listed Below (If Applicable).  YOU TAKE CLARITIN OR MUCINEX FOR ALLERGIES

## 2017-07-16 ENCOUNTER — Telehealth: Payer: Self-pay | Admitting: Internal Medicine

## 2017-07-16 NOTE — Telephone Encounter (Signed)
New message    Patient calling, states he wants "shock" cut off.  States the device is interfering with is job. Please call

## 2017-07-16 NOTE — Telephone Encounter (Signed)
Called pt back to discuss further pt stated that he was getting ready to go in for an apt and would call back later.

## 2017-07-16 NOTE — Telephone Encounter (Signed)
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 to discuss this and that [name] is out of the office the next two weeks, informed pt that a scheduler would be calling to make an apt,. Pt voiced understanding.

## 2017-07-16 NOTE — Telephone Encounter (Signed)
F/U call: ° °Patient returning your call  °

## 2017-07-30 ENCOUNTER — Ambulatory Visit (INDEPENDENT_AMBULATORY_CARE_PROVIDER_SITE_OTHER): Payer: 59 | Admitting: *Deleted

## 2017-07-30 DIAGNOSIS — I428 Other cardiomyopathies: Secondary | ICD-10-CM | POA: Diagnosis not present

## 2017-07-30 NOTE — Progress Notes (Signed)
Remote ICD transmission.   

## 2017-07-31 ENCOUNTER — Encounter: Payer: Self-pay | Admitting: Cardiology

## 2017-08-04 ENCOUNTER — Encounter: Payer: Self-pay | Admitting: Internal Medicine

## 2017-08-04 ENCOUNTER — Ambulatory Visit (INDEPENDENT_AMBULATORY_CARE_PROVIDER_SITE_OTHER): Payer: 59 | Admitting: Internal Medicine

## 2017-08-04 VITALS — BP 112/98 | HR 74 | Ht 75.0 in | Wt 280.0 lb

## 2017-08-04 DIAGNOSIS — I5022 Chronic systolic (congestive) heart failure: Secondary | ICD-10-CM

## 2017-08-04 DIAGNOSIS — I428 Other cardiomyopathies: Secondary | ICD-10-CM

## 2017-08-04 DIAGNOSIS — Z95810 Presence of automatic (implantable) cardiac defibrillator: Secondary | ICD-10-CM | POA: Diagnosis not present

## 2017-08-04 LAB — CUP PACEART INCLINIC DEVICE CHECK
Implantable Lead Implant Date: 20180706
Implantable Lead Serial Number: 114025
Implantable Pulse Generator Implant Date: 20180706
MDC IDC LEAD LOCATION: 753862
MDC IDC PG SERIAL: 228369
MDC IDC SESS DTM: 20190128095412

## 2017-08-04 NOTE — Patient Instructions (Addendum)
Medication Instructions:  Your physician recommends that you continue on your current medications as directed. Please refer to the Current Medication list given to you today.   Labwork: You will get a BMP today.  Testing/Procedures: None ordered.  Follow-Up:  Please make an appointment in one week with device clinic.  Your physician wants you to follow-up in: one year with [name].  You will receive a reminder letter in the mail two months in advance. If you don't receive a letter, please call our office to schedule the follow-up appointment.    Any Other Special Instructions Will Be Listed Below (If Applicable).  If you need a refill on your cardiac medications before your next appointment, please call your pharmacy.

## 2017-08-04 NOTE — Progress Notes (Signed)
Patient Care Team: McVey, Madelaine Bhat, PA-C as PCP - General (Physician Assistant) Nahser, Deloris Ping, MD as Attending Physician (Cardiology)   HPI  Nathan Baird is a 37 y.o. male Seen in follow-up for ICD implanted for primary prevention in the setting of nonischemic cardiomyopathy since [name] dysfunction and modest class II heart failure.  He has a history of a prior TIA currently is treated with aspirin.   Date Cr K Mg  6/18  1.15 4.0           DATE TEST    8/14 cMRI   EF 26 is %   4/16 Echo    EF 25-30 %   5/18 Echo EF 20-25%    The patient denies chest pain, shortness of breath, nocturnal dyspnea, orthopnea or peripheral edema.  There have been no palpitations, lightheadedness or syncope.   He is here today because of concerns at work related to his device being active.  It is his request that his device be inactivated temporarily.  Past Medical History:  Diagnosis Date  . CHF (congestive heart failure) (HCC)   . Cough    a. PFTS 09/2012: restriction probable, further examinations recommended. Saw pulm 11/2012: placed on GERD rx and short course prednisone, planned to regroup.  . CVA (cerebral vascular accident) (HCC) 01/23/2013   tPA administered; MRI brain showed small acute R MCA infarct  . History of palpitations    2x/month for several years feels heart racing  . Nonischemic cardiomyopathy (HCC)    a. Identified 10/2012 by echo EF 20-25%. b. Echo 01/2013: EF 20-25%, TEE EF 15% with severe RV dysfunction as well.  [name] PFO (patent foramen ovale)    a. By TEE 01/2013.    Past Surgical History:  Procedure Laterality Date  . NO PAST SURGERIES    . SUBQ ICD IMPLANT N/A 01/10/2017   Procedure: SubQ ICD Implant;  Surgeon: Duke Salvia, MD;  Location: [facility] INVASIVE CV LAB;  Service: Cardiovascular;  Laterality: N/A;  . TEE WITHOUT CARDIOVERSION N/A 01/26/2013   Procedure: TRANSESOPHAGEAL ECHOCARDIOGRAM (TEE);  Surgeon: [name], MD;  Location: [facility]  ENDOSCOPY;  Service: Cardiovascular;  Laterality: N/A;    Current Outpatient Medications  Medication Sig Dispense Refill  . atorvastatin (LIPITOR) 10 MG tablet Take 1 tablet (10 mg total) by mouth daily at 6 PM. 90 tablet 3  . carvedilol (COREG) 25 MG tablet Take 1 tablet (25 mg total) by mouth 2 (two) times daily. 180 tablet 3  . sacubitril-valsartan (ENTRESTO) 49-51 MG Take 1 tablet by mouth 2 (two) times daily. 180 tablet 3  . spironolactone (ALDACTONE) 25 MG tablet Take 1 tablet (25 mg total) by mouth daily. 90 tablet 3   No current facility-administered medications for this visit.     No Known Allergies    Review of Systems negative except from HPI and PMH  Physical Exam BP (!) 112/98   Pulse 74   Ht 6\' 3"  (1.905 m)   Wt 280 lb (127 kg)   BMI 35.00 kg/m  Well developed and nourished in no acute distress HENT normal Neck supple with JVP-flat Carotids brisk and full without bruits Clear Device pocket well healed; without hematoma or erythema.  There is no tethering  Regular rate and rhythm, no murmurs or gallops Abd-soft with active BS without hepatomegaly No Clubbing cyanosis edema Skin-warm and dry A & Oriented  Grossly normal sensory and motor function  ECG Demonstrates sinus rhythm at  74 Intervals 18/10/40 T wave inversions V4-V6   Assessment and  Plan  NICM  SubQ ICD  TIA   We will have him resume aspirin for his TIA 81 mg daily  He will continue on guideline directed therapy for his cardiomyopathy.  We will check his metabolic profile  We have discussed the issue of his ICD as relates to my understanding of DOT regulations.  His request is from the city that his device been activated.  He is aware that we will not impact DOT restrictions related to his cardiomyopathy.  We spent more than 50% of our >40  min visit in face to face counseling regarding the above      Current medicines are reviewed at length with the patient today .  The patient  does not  have concerns regarding medicines.

## 2017-08-23 ENCOUNTER — Other Ambulatory Visit: Payer: Self-pay

## 2017-08-23 ENCOUNTER — Encounter (HOSPITAL_BASED_OUTPATIENT_CLINIC_OR_DEPARTMENT_OTHER): Payer: Self-pay | Admitting: *Deleted

## 2017-08-23 DIAGNOSIS — Z79899 Other long term (current) drug therapy: Secondary | ICD-10-CM | POA: Diagnosis not present

## 2017-08-23 DIAGNOSIS — N341 Nonspecific urethritis: Secondary | ICD-10-CM | POA: Insufficient documentation

## 2017-08-23 DIAGNOSIS — N4889 Other specified disorders of penis: Secondary | ICD-10-CM | POA: Diagnosis present

## 2017-08-23 LAB — CUP PACEART REMOTE DEVICE CHECK
Date Time Interrogation Session: 20190216151342
Implantable Lead Implant Date: 20180706
Implantable Lead Location: 753862
MDC IDC LEAD SERIAL: 114025
MDC IDC PG IMPLANT DT: 20180706
Pulse Gen Serial Number: 228369

## 2017-08-23 NOTE — ED Triage Notes (Signed)
Pt reports penile pain x 2-3 days. Concerned for STD

## 2017-08-24 ENCOUNTER — Emergency Department (HOSPITAL_BASED_OUTPATIENT_CLINIC_OR_DEPARTMENT_OTHER)
Admission: EM | Admit: 2017-08-24 | Discharge: 2017-08-24 | Disposition: A | Discharge status: Home / Self Care | Payer: 59 | Attending: Emergency Medicine | Admitting: Emergency Medicine

## 2017-08-24 DIAGNOSIS — N342 Other urethritis: Secondary | ICD-10-CM

## 2017-08-24 LAB — URINALYSIS, ROUTINE W REFLEX MICROSCOPIC
Bilirubin Urine: NEGATIVE
GLUCOSE, UA: NEGATIVE mg/dL
HGB URINE DIPSTICK: NEGATIVE
KETONES UR: NEGATIVE mg/dL
Leukocytes, UA: NEGATIVE
Nitrite: NEGATIVE
PROTEIN: NEGATIVE mg/dL
Specific Gravity, Urine: 1.025 (ref 1.005–1.030)
pH: 6.5 (ref 5.0–8.0)

## 2017-08-24 NOTE — ED Provider Notes (Signed)
[facility] EMERGENCY DEPARTMENT Provider Note   CSN: [id number] Arrival date & time: 08/23/17  2242     History   Chief Complaint Chief Complaint  Patient presents with  . Penis Pain    HPI Nathan Baird is a 37 y.o. male.  The history is provided by the patient.  Penis Pain  This is a new problem. The current episode started 2 days ago. The problem occurs daily. The problem has been gradually worsening. Pertinent negatives include no abdominal pain. Exacerbated by: urination. The symptoms are relieved by rest.  Reports over the past 2 days been having dysuria.  No penile discharge.  No penile lesions.  No fever no vomiting.  No back pain.  No abdominal pain.  He thinks he may be exposed to STD.  He reports history of STD and this does feel similar  Past Medical History:  Diagnosis Date  . CHF (congestive heart failure) (HCC)   . Cough    a. PFTS 09/2012: restriction probable, further examinations recommended. Saw pulm 11/2012: placed on GERD rx and short course prednisone, planned to regroup.  . CVA (cerebral vascular accident) (HCC) 01/23/2013   tPA administered; MRI brain showed small acute R MCA infarct  . History of palpitations    2x/month for several years feels heart racing  . Nonischemic cardiomyopathy (HCC)    a. Identified 10/2012 by echo EF 20-25%. b. Echo 01/2013: EF 20-25%, TEE EF 15% with severe RV dysfunction as well.  [name] PFO (patent foramen ovale)    a. By TEE 01/2013.    Patient Active Problem List   Diagnosis Date Noted  . NICM (nonischemic cardiomyopathy) (HCC) 01/10/2017  . Trapezius muscle strain 11/24/2014  . Right shoulder pain 08/24/2014  . TIA (transient ischemic attack) 05/20/2014  . History of CVA (cerebrovascular accident)   . Essential hypertension   . HLD (hyperlipidemia)   . Encounter for long-term (current) use of high-risk medication 01/27/2013  . Patent foramen ovale with right to left shunt 01/27/2013  . Stroke, acute, embolic  (HCC) 01/25/2013  . Cough 10/20/2012  . Chronic systolic congestive heart failure (HCC) 10/20/2012  . Penile pain 05/19/2012  . ATTENTION DEFICIT, W/HYPERACTIVITY 09/04/2006    Past Surgical History:  Procedure Laterality Date  . NO PAST SURGERIES    . SUBQ ICD IMPLANT N/A 01/10/2017   Procedure: SubQ ICD Implant;  Surgeon: Duke Salvia, MD;  Location: [facility] INVASIVE CV LAB;  Service: Cardiovascular;  Laterality: N/A;  . TEE WITHOUT CARDIOVERSION N/A 01/26/2013   Procedure: TRANSESOPHAGEAL ECHOCARDIOGRAM (TEE);  Surgeon: [name], MD;  Location: [facility] ENDOSCOPY;  Service: Cardiovascular;  Laterality: N/A;       Home Medications    Prior to Admission medications   Medication Sig Start Date End Date Taking? Authorizing Provider  atorvastatin (LIPITOR) 10 MG tablet Take 1 tablet (10 mg total) by mouth daily at 6 PM. 10/24/16  Yes Nahser, Deloris Ping, MD  carvedilol (COREG) 25 MG tablet Take 1 tablet (25 mg total) by mouth 2 (two) times daily. 10/24/16  Yes Nahser, Deloris Ping, MD  sacubitril-valsartan (ENTRESTO) 49-51 MG Take 1 tablet by mouth 2 (two) times daily. 10/24/16  Yes Nahser, Deloris Ping, MD  spironolactone (ALDACTONE) 25 MG tablet Take 1 tablet (25 mg total) by mouth daily. 10/24/16  Yes Nahser, Deloris Ping, MD    Family History Family History  Problem Relation Age of Onset  . Sarcoidosis Mother   . Heart Problems Mother  PPM implantation age 29    Social History Social History   Tobacco Use  . Smoking status: Never Smoker  . Smokeless tobacco: Never Used  Substance Use Topics  . Alcohol use: Yes    Comment: social  . Drug use: No     Allergies   Patient has no known allergies.   Review of Systems Review of Systems  Gastrointestinal: Negative for abdominal pain.  Genitourinary: Positive for penile pain.     Physical Exam Updated Vital Signs BP 127/85 (BP Location: Right Arm)   Pulse 62   Temp 98 F (36.7 C) (Oral)   Resp 18   Ht 1.88 m (6\' 2" )   Wt  125.6 kg (277 lb)   SpO2 98%   BMI 35.56 kg/m   Physical Exam CONSTITUTIONAL: Well developed/well nourished HEAD: Normocephalic/atraumatic EYES: EOMI ENMT: Mucous membranes moist LUNGS: no apparent distress ABDOMEN: soft GU:no cva tenderness Nurse chaperone present.  No penile lesions, no penile discharge, no scrotal tenderness or edema. NEURO: Pt is awake/alert/appropriate, moves all extremitiesx4.  No facial droop.   EXTREMITIES:  full ROM SKIN: warm, color normal PSYCH: no abnormalities of mood noted, alert and oriented to situation   ED Treatments / Results  Labs (all labs ordered are listed, but only abnormal results are displayed) Labs Reviewed  URINALYSIS, ROUTINE W REFLEX MICROSCOPIC  GC/CHLAMYDIA PROBE AMP () NOT AT [facility]    EKG  EKG Interpretation None       Radiology No results found.  Procedures Procedures (including critical care time)  Medications Ordered in ED Medications - No data to display   Initial Impression / Assessment and Plan / ED Course  I have reviewed the triage vital signs and the nursing notes.  Pertinent labs  results that were available during my care of the patient were reviewed by me and considered in my medical decision making (see chart for details).     We discussed strict safe sex precautions.  Advised   result within 48-72 hours  Final Clinical Impressions(s) / ED Diagnoses   Final diagnoses:  Urethritis    ED Discharge Orders    None       Zadie Rhine, MD 08/24/17 [id number]

## 2017-08-24 NOTE — ED Notes (Signed)
Pt given d/c instructions as per chart. Verbalizes understanding. No questions. 

## 2017-08-24 NOTE — ED Notes (Signed)
Pt describes pain to his penis for a couple of days. Denies urinary s/s or d/c. States partner "had something before".

## 2017-08-24 NOTE — ED Notes (Signed)
Pt ambulatory to bathroom

## 2017-08-25 ENCOUNTER — Telehealth: Payer: Self-pay

## 2017-08-25 LAB — GC/CHLAMYDIA PROBE AMP (~~LOC~~) NOT AT ARMC
Chlamydia: NEGATIVE
NEISSERIA GONORRHEA: NEGATIVE

## 2017-08-25 NOTE — Telephone Encounter (Signed)
I did an [place] PA over the phone with Tawanna at [employer]. Per Donia Guiles this PA is approved from today until 08/25/2018.  I have notified [facility]-the pts pharmacy.

## 2017-08-28 ENCOUNTER — Encounter: Payer: Self-pay | Admitting: Internal Medicine

## 2017-08-29 NOTE — ED Notes (Signed)
08/29/2017, Pt. Called for STD results , reviewed with pt.  All questions answered

## 2017-10-29 ENCOUNTER — Ambulatory Visit (INDEPENDENT_AMBULATORY_CARE_PROVIDER_SITE_OTHER): Payer: Self-pay | Admitting: *Deleted

## 2017-10-29 ENCOUNTER — Telehealth: Payer: Self-pay | Admitting: Cardiology

## 2017-10-29 DIAGNOSIS — I428 Other cardiomyopathies: Secondary | ICD-10-CM

## 2017-10-29 NOTE — Telephone Encounter (Signed)
LMOVM reminding pt to send remote transmission.   

## 2017-10-31 ENCOUNTER — Encounter: Payer: Self-pay | Admitting: Cardiology

## 2017-10-31 NOTE — Progress Notes (Signed)
Remote ICD transmission.   

## 2017-11-06 LAB — CUP PACEART REMOTE DEVICE CHECK
Implantable Lead Implant Date: 20180706
Implantable Lead Serial Number: 114025
Implantable Pulse Generator Implant Date: 20180706
MDC IDC LEAD LOCATION: 753862
MDC IDC MSMT BATTERY REMAINING PERCENTAGE: 91 %
MDC IDC SESS DTM: 20190426060600
Pulse Gen Serial Number: 228369

## 2017-12-19 ENCOUNTER — Other Ambulatory Visit: Payer: Self-pay | Admitting: Cardiovascular Disease

## 2017-12-19 DIAGNOSIS — I5022 Chronic systolic (congestive) heart failure: Secondary | ICD-10-CM

## 2017-12-19 DIAGNOSIS — Z8673 Personal history of transient ischemic attack (TIA), and cerebral infarction without residual deficits: Secondary | ICD-10-CM

## 2018-01-13 ENCOUNTER — Encounter: Payer: Self-pay | Admitting: Emergency Medicine

## 2018-01-13 ENCOUNTER — Other Ambulatory Visit: Payer: Self-pay

## 2018-01-13 DIAGNOSIS — M5489 Other dorsalgia: Secondary | ICD-10-CM | POA: Insufficient documentation

## 2018-01-13 DIAGNOSIS — I110 Hypertensive heart disease with heart failure: Secondary | ICD-10-CM | POA: Insufficient documentation

## 2018-01-13 DIAGNOSIS — Y999 Unspecified external cause status: Secondary | ICD-10-CM | POA: Insufficient documentation

## 2018-01-13 DIAGNOSIS — Y9389 Activity, other specified: Secondary | ICD-10-CM | POA: Insufficient documentation

## 2018-01-13 DIAGNOSIS — S46919A Strain of unspecified muscle, fascia and tendon at shoulder and upper arm level, unspecified arm, initial encounter: Secondary | ICD-10-CM | POA: Insufficient documentation

## 2018-01-13 DIAGNOSIS — Z79899 Other long term (current) drug therapy: Secondary | ICD-10-CM | POA: Insufficient documentation

## 2018-01-13 DIAGNOSIS — Y9289 Other specified places as the place of occurrence of the external cause: Secondary | ICD-10-CM | POA: Insufficient documentation

## 2018-01-13 DIAGNOSIS — I5022 Chronic systolic (congestive) heart failure: Secondary | ICD-10-CM | POA: Diagnosis not present

## 2018-01-13 DIAGNOSIS — M25519 Pain in unspecified shoulder: Secondary | ICD-10-CM | POA: Diagnosis present
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
## 2018-01-14 ENCOUNTER — Emergency Department
Admission: EM | Admit: 2018-01-14 | Discharge: 2018-01-14 | Disposition: A | Discharge status: Home / Self Care | Payer: BLUE CROSS/BLUE SHIELD | Attending: Student in an Organized Health Care Education/Training Program | Admitting: Student in an Organized Health Care Education/Training Program

## 2018-01-14 DIAGNOSIS — T148XXA Other injury of unspecified body region, initial encounter: Secondary | ICD-10-CM

## 2018-01-14 MED ORDER — CYCLOBENZAPRINE HCL 10 MG PO TABS: 10.0000 mg | ORAL_TABLET | Freq: Three times a day (TID) | ORAL | 0 refills | Status: DC | PRN
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
## 2018-01-29 ENCOUNTER — Ambulatory Visit (INDEPENDENT_AMBULATORY_CARE_PROVIDER_SITE_OTHER): Payer: BLUE CROSS/BLUE SHIELD | Admitting: *Deleted

## 2018-01-29 ENCOUNTER — Telehealth: Payer: Self-pay | Admitting: Cardiology

## 2018-01-29 DIAGNOSIS — I428 Other cardiomyopathies: Secondary | ICD-10-CM

## 2018-01-29 DIAGNOSIS — I5022 Chronic systolic (congestive) heart failure: Secondary | ICD-10-CM

## 2018-01-29 NOTE — Telephone Encounter (Signed)
LMOVM reminding pt to send remote transmission.   

## 2018-01-30 NOTE — Progress Notes (Signed)
Remote ICD transmission.   

## 2018-02-19 ENCOUNTER — Observation Stay (HOSPITAL_BASED_OUTPATIENT_CLINIC_OR_DEPARTMENT_OTHER)
Admission: EM | Admit: 2018-02-19 | Discharge: 2018-02-21 | Disposition: A | Discharge status: Home / Self Care | Payer: BLUE CROSS/BLUE SHIELD | Attending: Internal Medicine | Admitting: Internal Medicine

## 2018-02-19 ENCOUNTER — Encounter (HOSPITAL_BASED_OUTPATIENT_CLINIC_OR_DEPARTMENT_OTHER): Payer: Self-pay

## 2018-02-19 ENCOUNTER — Other Ambulatory Visit: Payer: Self-pay

## 2018-02-19 ENCOUNTER — Emergency Department (HOSPITAL_BASED_OUTPATIENT_CLINIC_OR_DEPARTMENT_OTHER): Payer: BLUE CROSS/BLUE SHIELD

## 2018-02-19 DIAGNOSIS — K219 Gastro-esophageal reflux disease without esophagitis: Secondary | ICD-10-CM | POA: Insufficient documentation

## 2018-02-19 DIAGNOSIS — R918 Other nonspecific abnormal finding of lung field: Secondary | ICD-10-CM | POA: Insufficient documentation

## 2018-02-19 DIAGNOSIS — R072 Precordial pain: Secondary | ICD-10-CM | POA: Diagnosis present

## 2018-02-19 DIAGNOSIS — Z95810 Presence of automatic (implantable) cardiac defibrillator: Secondary | ICD-10-CM | POA: Diagnosis not present

## 2018-02-19 DIAGNOSIS — Q211 Atrial septal defect: Secondary | ICD-10-CM | POA: Diagnosis not present

## 2018-02-19 DIAGNOSIS — Z9889 Other specified postprocedural states: Secondary | ICD-10-CM | POA: Insufficient documentation

## 2018-02-19 DIAGNOSIS — I429 Cardiomyopathy, unspecified: Secondary | ICD-10-CM | POA: Insufficient documentation

## 2018-02-19 DIAGNOSIS — R001 Bradycardia, unspecified: Secondary | ICD-10-CM | POA: Insufficient documentation

## 2018-02-19 DIAGNOSIS — Z8249 Family history of ischemic heart disease and other diseases of the circulatory system: Secondary | ICD-10-CM | POA: Insufficient documentation

## 2018-02-19 DIAGNOSIS — I5022 Chronic systolic (congestive) heart failure: Secondary | ICD-10-CM | POA: Diagnosis not present

## 2018-02-19 DIAGNOSIS — Z79899 Other long term (current) drug therapy: Secondary | ICD-10-CM | POA: Insufficient documentation

## 2018-02-19 DIAGNOSIS — Z8673 Personal history of transient ischemic attack (TIA), and cerebral infarction without residual deficits: Secondary | ICD-10-CM | POA: Diagnosis not present

## 2018-02-19 DIAGNOSIS — R079 Chest pain, unspecified: Secondary | ICD-10-CM

## 2018-02-19 DIAGNOSIS — I110 Hypertensive heart disease with heart failure: Secondary | ICD-10-CM | POA: Diagnosis not present

## 2018-02-19 LAB — TROPONIN I: Troponin I: <0.03 ng/mL (ref <0.03)

## 2018-02-19 LAB — BASIC METABOLIC PANEL
Anion gap: 10 (ref 5–15)
BUN: 21 mg/dL — ABNORMAL HIGH (ref 6–20)
CHLORIDE: 105 mmol/L (ref 98–111)
CO2: 24 mmol/L (ref 22–32)
CREATININE: 1.14 mg/dL (ref 0.61–1.24)
Calcium: 9.7 mg/dL (ref 8.9–10.3)
GFR calc Af Amer: >60 mL/min (ref >60)
GFR calc non Af Amer: >60 mL/min (ref >60)
GLUCOSE: 91 mg/dL (ref 70–99)
POTASSIUM: 3.6 mmol/L (ref 3.5–5.1)
Sodium: 139 mmol/L (ref 135–145)

## 2018-02-19 LAB — CBC
HCT: 45.8 % (ref 39.0–52.0)
Hemoglobin: 15.7 g/dL (ref 13.0–17.0)
MCH: 29.9 pg (ref 26.0–34.0)
MCHC: 34.3 g/dL (ref 30.0–36.0)
MCV: 87.2 fL (ref 78.0–100.0)
Platelets: 338 10*3/uL (ref 150–400)
RBC: 5.25 MIL/uL (ref 4.22–5.81)
RDW: 12.9 % (ref 11.5–15.5)
WBC: 9 10*3/uL (ref 4.0–10.5)

## 2018-02-19 LAB — HEPATIC FUNCTION PANEL
ALK PHOS: 54 U/L (ref 38–126)
ALT: 22 U/L (ref 0–44)
AST: 25 U/L (ref 15–41)
Albumin: 4.4 g/dL (ref 3.5–5.0)
BILIRUBIN TOTAL: 0.4 mg/dL (ref 0.3–1.2)
Total Protein: 8 g/dL (ref 6.5–8.1)

## 2018-02-19 LAB — BRAIN NATRIURETIC PEPTIDE: B Natriuretic Peptide: 14.3 pg/mL (ref 0.0–100.0)

## 2018-02-19 LAB — LIPASE, BLOOD: Lipase: 50 U/L (ref 11–51)

## 2018-02-19 MED ORDER — NITROGLYCERIN 0.4 MG SL SUBL
0.4000 mg | SUBLINGUAL_TABLET | SUBLINGUAL | Status: DC | PRN
  Administered 2018-02-19: 0.4 mg via SUBLINGUAL
  Filled 2018-02-19: qty 1

## 2018-02-19 MED ORDER — ASPIRIN 81 MG PO CHEW
324.0000 mg | CHEWABLE_TABLET | Freq: Once | ORAL | Status: AC
  Administered 2018-02-19: 324 mg via ORAL
  Filled 2018-02-19: qty 4

## 2018-02-19 NOTE — ED Provider Notes (Signed)
[facility] EMERGENCY DEPARTMENT Provider Note   CSN: [id number] Arrival date & time: 02/19/18  2112     History   Chief Complaint Chief Complaint  Patient presents with  . Chest Pain    HPI Nathan Baird is a 37 y.o. male.  The history is provided by the patient and medical records. No language interpreter was used.  Chest Pain   This is a new problem. The current episode started 3 to 5 hours ago. The problem occurs constantly. The problem has not changed since onset.The pain is associated with exertion. The pain is present in the substernal region. The pain is at a severity of 6/10. The pain is moderate. The quality of the pain is described as exertional, heavy and pressure-like. The pain radiates to the left arm and right arm. The symptoms are aggravated by exertion. Associated symptoms include diaphoresis. Pertinent negatives include no back pain, no cough, no fever, no headaches, no irregular heartbeat, no leg pain, no lower extremity edema, no malaise/fatigue, no nausea, no near-syncope, no palpitations, no shortness of breath, no syncope, no vomiting and no weakness. He has tried nothing for the symptoms. The treatment provided no relief. Risk factors include male gender.  His past medical history is significant for CHF.    Past Medical History:  Diagnosis Date  . CHF (congestive heart failure) (HCC)   . Cough    a. PFTS 09/2012: restriction probable, further examinations recommended. Saw pulm 11/2012: placed on GERD rx and short course prednisone, planned to regroup.  . CVA (cerebral vascular accident) (HCC) 01/23/2013   tPA administered; MRI brain showed small acute R MCA infarct  . History of palpitations    2x/month for several years feels heart racing  . Nonischemic cardiomyopathy (HCC)    a. Identified 10/2012 by echo EF 20-25%. b. Echo 01/2013: EF 20-25%, TEE EF 15% with severe RV dysfunction as well.  [name] PFO (patent foramen ovale)    a. By TEE 01/2013.     Patient Active Problem List   Diagnosis Date Noted  . NICM (nonischemic cardiomyopathy) (HCC) 01/10/2017  . Trapezius muscle strain 11/24/2014  . Right shoulder pain 08/24/2014  . TIA (transient ischemic attack) 05/20/2014  . History of CVA (cerebrovascular accident)   . Essential hypertension   . HLD (hyperlipidemia)   . Encounter for long-term (current) use of high-risk medication 01/27/2013  . Patent foramen ovale with right to left shunt 01/27/2013  . Stroke, acute, embolic (HCC) 01/25/2013  . Cough 10/20/2012  . Chronic systolic congestive heart failure (HCC) 10/20/2012  . Penile pain 05/19/2012  . ATTENTION DEFICIT, W/HYPERACTIVITY 09/04/2006    Past Surgical History:  Procedure Laterality Date  . NO PAST SURGERIES    . SUBQ ICD IMPLANT N/A 01/10/2017   Procedure: SubQ ICD Implant;  Surgeon: Duke Salvia, MD;  Location: [facility] INVASIVE CV LAB;  Service: Cardiovascular;  Laterality: N/A;  . TEE WITHOUT CARDIOVERSION N/A 01/26/2013   Procedure: TRANSESOPHAGEAL ECHOCARDIOGRAM (TEE);  Surgeon: [name], MD;  Location: [facility] ENDOSCOPY;  Service: Cardiovascular;  Laterality: N/A;        Home Medications    Prior to Admission medications   Medication Sig Start Date End Date Taking? Authorizing Provider  atorvastatin (LIPITOR) 10 MG tablet Take 1 tablet (10 mg total) by mouth daily at 6 PM. 10/24/16   Nahser, Deloris Ping, MD  carvedilol (COREG) 25 MG tablet TAKE 1 TABLET BY MOUTH TWICE DAILY 12/22/17   Nahser, Deloris Ping, MD  cyclobenzaprine (FLEXERIL) 10 MG tablet Take 1 tablet (10 mg total) by mouth 3 (three) times daily as needed for muscle spasms. 01/14/18   Willy Eddy, MD  ENTRESTO 49-51 MG TAKE 1 TABLET BY MOUTH TWICE DAILY` 12/22/17   Nahser, Deloris Ping, MD  spironolactone (ALDACTONE) 25 MG tablet TAKE 1 TABLET BY MOUTH EVERY DAY 12/22/17   Nahser, Deloris Ping, MD    Family History Family History  Problem Relation Age of Onset  . Sarcoidosis Mother   . Heart Problems  Mother        PPM implantation age 70    Social History Social History   Tobacco Use  . Smoking status: Never Smoker  . Smokeless tobacco: Never Used  Substance Use Topics  . Alcohol use: Yes    Comment: occ  . Drug use: No     Allergies   Patient has no known allergies.   Review of Systems Review of Systems  Constitutional: Positive for diaphoresis. Negative for chills, fatigue, fever and malaise/fatigue.  HENT: Negative for congestion.   Respiratory: Negative for cough, chest tightness, shortness of breath, wheezing and stridor.   Cardiovascular: Positive for chest pain. Negative for palpitations, leg swelling, syncope and near-syncope.  Gastrointestinal: Negative for diarrhea, nausea and vomiting.  Genitourinary: Negative for flank pain.  Musculoskeletal: Negative for back pain, neck pain and neck stiffness.  Neurological: Negative for weakness, light-headedness and headaches.  Psychiatric/Behavioral: Negative for agitation.  All other systems reviewed and are negative.    Physical Exam Updated Vital Signs BP 110/83   Pulse 78   Temp 98.4 F (36.9 C) (Oral)   Resp (!) 22   Ht 6\' 2"  (1.88 m)   Wt 117.5 kg   SpO2 97%   BMI 33.25 kg/m   Physical Exam  Constitutional: He is oriented to person, place, and time. He appears well-developed and well-nourished.  Non-toxic appearance. He does not appear ill. No distress.  HENT:  Head: Normocephalic and atraumatic.  Nose: Nose normal.  Mouth/Throat: Oropharynx is clear and moist. No oropharyngeal exudate.  Eyes: Pupils are equal, round, and reactive to light. Conjunctivae and EOM are normal.  Neck: Normal range of motion. Neck supple.  Cardiovascular: Normal rate, regular rhythm and intact distal pulses.  No murmur heard. Pulmonary/Chest: Effort normal and breath sounds normal. No stridor. No respiratory distress. He has no wheezes. He has no rales. He exhibits no tenderness.  Abdominal: Soft. He exhibits no  distension. There is no tenderness.  Musculoskeletal: He exhibits no edema or tenderness.  Neurological: He is alert and oriented to person, place, and time. No cranial nerve deficit or sensory deficit. He exhibits normal muscle tone. Coordination normal.  Skin: Skin is warm and dry. Capillary refill takes less than 2 seconds. He is not diaphoretic. No erythema. No pallor.  Psychiatric: He has a normal mood and affect.  Nursing note and vitals reviewed.    ED Treatments / Results  Labs (all labs ordered are listed, but only abnormal results are displayed) Labs Reviewed  BASIC METABOLIC PANEL - Abnormal; Notable for the following components:      Result Value   BUN 21 (*)    All other components within normal limits  CBC  TROPONIN I  BRAIN NATRIURETIC PEPTIDE  LIPASE, BLOOD  HEPATIC FUNCTION PANEL    EKG None  ED ECG REPORT   Date: 02/19/2018  Rate: 82  Rhythm: normal sinus rhythm  QRS Axis: normal  Intervals: normal  ST/T Wave abnormalities: nonspecific  T wave changes  Conduction Disutrbances:none  Narrative Interpretation:   Old EKG Reviewed: unchanged  I have personally reviewed the EKG tracing and agree with the computerized printout as noted.   Radiology Dg Chest 2 View  Result Date: 02/19/2018 CLINICAL DATA:  Upper chest pain for several hours EXAM: CHEST - 2 VIEW COMPARISON:  01/24/2017 FINDINGS: Defibrillator is again seen. Cardiac shadow is stable. Lungs are clear bilaterally. No acute bony abnormality is noted. IMPRESSION: No active cardiopulmonary disease. Electronically Signed   By: Alcide Clever M.D.   On: 02/19/2018 21:53    Procedures Procedures (including critical care time)  Medications Ordered in ED Medications  nitroGLYCERIN (NITROSTAT) SL tablet 0.4 mg (0.4 mg Sublingual Given 02/19/18 2331)  aspirin chewable tablet 324 mg (324 mg Oral Given 02/19/18 2330)     Initial Impression / Assessment and Plan / ED Course  I have reviewed the triage  vital signs and the nursing notes.  Pertinent labs & imaging results that were available during my care of the patient were reviewed by me and considered in my medical decision making (see chart for details).     CISCO KINDT is a 37 y.o. male with a past medical history significant for congestive heart failure with systolic dysfunction, prior stroke, PFO, hypertension, and hyperlipidemia who presents with chest pain.  Patient reports that he is managed by Dr. Melburn Popper and [name] for his cardiology management.  He reports that today at around noon, he had onset of chest discomfort.  He describes it being an aching and pressure in his central left chest.  He reports his arms were feeling strange when the pain was worse.  He reports no nausea, or vomiting but does report some diaphoresis.  He reports the pain was exertional and better with rest.  He has never felt this pain before.  He denies any palpitations or syncope.  Patient's EKG appears similar to prior with T wave inversions.  On exam, lungs are clear and chest is nontender.  Abdomen is nontender.  Patient has no edema in his legs.  Patient is alert and oriented.    Patient's work-up showed negative troponin initially.  Negative BNP.  Laboratory testing otherwise reassuring.  Patient reports he continues to have a 4 out of 10 central left chest pain.  Cardiology was called given the patient's cardiac history and his continued chest pain.  They recommended patient be admitted to internal medicine service at [facility] for further chest pain rule out given his cardiac history.  They agreed with aspirin and nitro to try to help the symptoms.  Medicine he will be called for admission for continued concerning chest pain and a 37 year old with CHF with poor EF and an ICD.  11:45 PM After nitro, his pain resolved.  Hospitalist team was called and patient will be admitted for further management.   Final Clinical Impressions(s) / ED Diagnoses     Final diagnoses:  Precordial pain    ED Discharge Orders    None     Clinical Impression: 1. Precordial pain     Disposition: Admit  This note was prepared with assistance of Dragon voice recognition software. Occasional wrong-word or sound-a-like substitutions may have occurred due to the inherent limitations of voice recognition software.     Tegeler, Canary Brim, MD 02/19/18 [phone]

## 2018-02-19 NOTE — ED Notes (Signed)
Patient transported to X-ray 

## 2018-02-19 NOTE — ED Notes (Signed)
ED Provider at bedside. 

## 2018-02-19 NOTE — ED Triage Notes (Signed)
C/o CP started 12pm today-NAD-steady gait

## 2018-02-20 ENCOUNTER — Encounter (HOSPITAL_COMMUNITY): Payer: Self-pay | Admitting: *Deleted

## 2018-02-20 ENCOUNTER — Other Ambulatory Visit: Payer: Self-pay

## 2018-02-20 DIAGNOSIS — R079 Chest pain, unspecified: Secondary | ICD-10-CM

## 2018-02-20 DIAGNOSIS — Q211 Atrial septal defect: Secondary | ICD-10-CM

## 2018-02-20 DIAGNOSIS — Z8673 Personal history of transient ischemic attack (TIA), and cerebral infarction without residual deficits: Secondary | ICD-10-CM | POA: Diagnosis not present

## 2018-02-20 DIAGNOSIS — I428 Other cardiomyopathies: Secondary | ICD-10-CM | POA: Diagnosis not present

## 2018-02-20 DIAGNOSIS — R072 Precordial pain: Secondary | ICD-10-CM | POA: Diagnosis not present

## 2018-02-20 DIAGNOSIS — I110 Hypertensive heart disease with heart failure: Secondary | ICD-10-CM | POA: Diagnosis not present

## 2018-02-20 DIAGNOSIS — I429 Cardiomyopathy, unspecified: Secondary | ICD-10-CM | POA: Diagnosis not present

## 2018-02-20 DIAGNOSIS — I5022 Chronic systolic (congestive) heart failure: Secondary | ICD-10-CM | POA: Diagnosis not present

## 2018-02-20 LAB — CUP PACEART REMOTE DEVICE CHECK
Battery Remaining Percentage: 88 %
Date Time Interrogation Session: 20190726050300
Implantable Lead Implant Date: 20180706
Implantable Lead Model: 3401
Implantable Pulse Generator Implant Date: 20180706
MDC IDC LEAD LOCATION: 753862
MDC IDC LEAD SERIAL: 114025
Pulse Gen Serial Number: 228369

## 2018-02-20 LAB — TROPONIN I
Troponin I: <0.03 ng/mL (ref <0.03)
Troponin I: <0.03 ng/mL (ref <0.03)

## 2018-02-20 LAB — HIV ANTIBODY (ROUTINE TESTING W REFLEX): HIV Screen 4th Generation wRfx: NONREACTIVE

## 2018-02-20 MED ORDER — ENOXAPARIN SODIUM 40 MG/0.4ML ~~LOC~~ SOLN
40.0000 mg | Freq: Every day | SUBCUTANEOUS | Status: DC
  Filled 2018-02-20 (×2): qty 0.4

## 2018-02-20 MED ORDER — CYCLOBENZAPRINE HCL 10 MG PO TABS
10.0000 mg | ORAL_TABLET | Freq: Three times a day (TID) | ORAL | Status: DC | PRN
Start: 2018-02-20 — End: 2018-02-21

## 2018-02-20 MED ORDER — ACETAMINOPHEN 325 MG PO TABS
650.0000 mg | ORAL_TABLET | ORAL | Status: DC | PRN
  Administered 2018-02-21: 650 mg via ORAL
  Filled 2018-02-20: qty 2

## 2018-02-20 MED ORDER — MORPHINE SULFATE (PF) 2 MG/ML IV SOLN: 2.0000 mg | INTRAVENOUS | Status: DC | PRN

## 2018-02-20 MED ORDER — CARVEDILOL 25 MG PO TABS
25.0000 mg | ORAL_TABLET | Freq: Two times a day (BID) | ORAL | Status: DC
  Administered 2018-02-20: 25 mg via ORAL
  Filled 2018-02-20 (×2): qty 1

## 2018-02-20 MED ORDER — ATORVASTATIN CALCIUM 10 MG PO TABS
10.0000 mg | ORAL_TABLET | Freq: Every day | ORAL | Status: DC
  Administered 2018-02-20: 10 mg via ORAL
  Filled 2018-02-20: qty 1

## 2018-02-20 MED ORDER — ASPIRIN EC 81 MG PO TBEC
81.0000 mg | DELAYED_RELEASE_TABLET | Freq: Every day | ORAL | Status: DC
  Administered 2018-02-21: 81 mg via ORAL
  Filled 2018-02-20: qty 1

## 2018-02-20 MED ORDER — ONDANSETRON HCL 4 MG/2ML IJ SOLN: 4.0000 mg | Freq: Four times a day (QID) | INTRAMUSCULAR | Status: DC | PRN

## 2018-02-20 MED ORDER — SACUBITRIL-VALSARTAN 49-51 MG PO TABS
1.0000 | ORAL_TABLET | Freq: Two times a day (BID) | ORAL | Status: DC
  Administered 2018-02-20 – 2018-02-21 (×2): 1 via ORAL
  Filled 2018-02-20 (×4): qty 1

## 2018-02-20 MED ORDER — NITROGLYCERIN 0.4 MG SL SUBL: 0.4000 mg | SUBLINGUAL_TABLET | SUBLINGUAL | Status: DC | PRN

## 2018-02-20 MED ORDER — SPIRONOLACTONE 25 MG PO TABS
25.0000 mg | ORAL_TABLET | Freq: Every day | ORAL | Status: DC
  Administered 2018-02-20 – 2018-02-21 (×2): 25 mg via ORAL
  Filled 2018-02-20 (×2): qty 1

## 2018-02-20 NOTE — H&P (Signed)
History and Physical    Nathan Baird QMV:[id number] DOB: 05-28-1981 DOA: 02/19/2018  PCP: Sebastian Ache, PA-C   Patient coming from: Home   Chief Complaint: Chest pain   HPI: Nathan Baird is a 37 y.o. male with medical history significant for cardiomyopathy with EF 25-30% and ICD, PFO, and history of ischemic right MCA stroke, now presenting for evaluation of chest pain.  Patient had been in his usual state of health until around noon on 02/19/2018 when he was driving and developed acute onset of substernal chest pain.  He describes this as 6/10 in severity, pressure-like in character, radiating to both arms, associated with diaphoresis, and no exacerbating or alleviating factors identified.  He denies any cough, shortness of breath, fevers, or chills.  He denies any lower extremity swelling or tenderness.  He had not experienced these symptoms previously.  He had a stress test in 2016 without evidence for ischemia, but was a high risk study due to EF <35%.  He follows with cardiology for cardiomyopathy and has ICD.  ED Course: Upon arrival to the ED, patient is found to be afebrile, saturating well on room air, and with vitals otherwise stable.  EKG features a sinus bradycardia with rate 58, T wave flattening and inversions, similar to prior.  Chest x-ray is negative for acute cardiopulmonary disease.  Chemistry panel and CBC are unremarkable, BNP is normal, and troponin is undetectable.  Her pain resolved with nitroglycerin in the ED.  Patient was treated with 324 mg of aspirin.  ED physician discussed case with cardiology and medical admission was recommended for ACS rule out.  Review of Systems:  All other systems reviewed and apart from HPI, are negative.  Past Medical History:  Diagnosis Date  . CHF (congestive heart failure) (HCC)   . Cough    a. PFTS 09/2012: restriction probable, further examinations recommended. Saw pulm 11/2012: placed on GERD rx and short course  prednisone, planned to regroup.  . CVA (cerebral vascular accident) (HCC) 01/23/2013   tPA administered; MRI brain showed small acute R MCA infarct  . History of palpitations    2x/month for several years feels heart racing  . Nonischemic cardiomyopathy (HCC)    a. Identified 10/2012 by echo EF 20-25%. b. Echo 01/2013: EF 20-25%, TEE EF 15% with severe RV dysfunction as well.  [name] PFO (patent foramen ovale)    a. By TEE 01/2013.    Past Surgical History:  Procedure Laterality Date  . NO PAST SURGERIES    . SUBQ ICD IMPLANT N/A 01/10/2017   Procedure: SubQ ICD Implant;  Surgeon: Duke Salvia, MD;  Location: [facility] INVASIVE CV LAB;  Service: Cardiovascular;  Laterality: N/A;  . TEE WITHOUT CARDIOVERSION N/A 01/26/2013   Procedure: TRANSESOPHAGEAL ECHOCARDIOGRAM (TEE);  Surgeon: [name], MD;  Location: [facility] ENDOSCOPY;  Service: Cardiovascular;  Laterality: N/A;     reports that he has never smoked. He has never used smokeless tobacco. He reports that he drinks alcohol. He reports that he does not use drugs.  No Known Allergies  Family History  Problem Relation Age of Onset  . Sarcoidosis Mother   . Heart Problems Mother        PPM implantation age 68     Prior to Admission medications   Medication Sig Start Date End Date Taking? Authorizing Provider  atorvastatin (LIPITOR) 10 MG tablet Take 1 tablet (10 mg total) by mouth daily at 6 PM. 10/24/16   Nahser, Deloris Ping, MD  carvedilol (COREG) 25 MG tablet TAKE 1 TABLET BY MOUTH TWICE DAILY 12/22/17   Nahser, Deloris Ping, MD  cyclobenzaprine (FLEXERIL) 10 MG tablet Take 1 tablet (10 mg total) by mouth 3 (three) times daily as needed for muscle spasms. 01/14/18   Willy Eddy, MD  ENTRESTO 49-51 MG TAKE 1 TABLET BY MOUTH TWICE DAILY` 12/22/17   Nahser, Deloris Ping, MD  spironolactone (ALDACTONE) 25 MG tablet TAKE 1 TABLET BY MOUTH EVERY DAY 12/22/17   Nahser, Deloris Ping, MD    Physical Exam: Vitals:   02/19/18 2337 02/20/18 0000 02/20/18 0030  02/20/18 0139  BP: 107/73 102/83 104/72 107/84  Pulse: 78 76 73 72  Resp: 18 19 16 19   Temp:    98.2 F (36.8 C)  TempSrc:    Oral  SpO2: 94% 96% 98% 98%  Weight:      Height:          Constitutional: NAD, calm  Eyes: PERTLA, lids and conjunctivae normal ENMT: Mucous membranes are moist. Posterior pharynx clear of any exudate or lesions.   Neck: normal, supple, no masses, no thyromegaly Respiratory: clear to auscultation bilaterally, no wheezing, no crackles. Normal respiratory effort.    Cardiovascular: S1 & S2 heard, regular rate and rhythm. No extremity edema. No significant JVD. Abdomen: No distension, no tenderness, sopft. Bowel sounds normal.  Musculoskeletal: no clubbing / cyanosis. No joint deformity upper and lower extremities.    Skin: no significant rashes, lesions, ulcers. Warm, dry, well-perfused. Neurologic: CN 2-12 grossly intact. Sensation intact. Strength 5/5 in all 4 limbs.  Psychiatric: Alert and oriented x 3. Pleasant and cooperative.     Labs on Admission: I have personally reviewed following labs and imaging studies  CBC: Recent Labs  Lab 02/19/18 2131  WBC 9.0  HGB 15.7  HCT 45.8  MCV 87.2  PLT 338   Basic Metabolic Panel: Recent Labs  Lab 02/19/18 2131  NA 139  K 3.6  CL 105  CO2 24  GLUCOSE 91  BUN 21*  CREATININE 1.14  CALCIUM 9.7   GFR: Estimated Creatinine Clearance: 122 mL/min (by C-G formula based on SCr of 1.14 mg/dL). Liver Function Tests: Recent Labs  Lab 02/19/18 2131  AST 25  ALT 22  ALKPHOS 54  BILITOT 0.4  PROT 8.0  ALBUMIN 4.4   Recent Labs  Lab 02/19/18 2131  LIPASE 50   No results for input(s): AMMONIA in the last 168 hours. Coagulation Profile: No results for input(s): INR, PROTIME in the last 168 hours. Cardiac Enzymes: Recent Labs  Lab 02/19/18 2131  TROPONINI <0.03   BNP (last 3 results) No results for input(s): PROBNP in the last 8760 hours. HbA1C: No results for input(s): HGBA1C in the  last 72 hours. CBG: No results for input(s): GLUCAP in the last 168 hours. Lipid Profile: No results for input(s): CHOL, HDL, LDLCALC, TRIG, CHOLHDL, LDLDIRECT in the last 72 hours. Thyroid Function Tests: No results for input(s): TSH, T4TOTAL, FREET4, T3FREE, THYROIDAB in the last 72 hours. Anemia Panel: No results for input(s): VITAMINB12, FOLATE, FERRITIN, TIBC, IRON, RETICCTPCT in the last 72 hours. Urine analysis:    Component Value Date/Time   COLORURINE YELLOW 08/24/2017 0010   APPEARANCEUR CLEAR 08/24/2017 0010   LABSPEC 1.025 08/24/2017 0010   PHURINE 6.5 08/24/2017 0010   GLUCOSEU NEGATIVE 08/24/2017 0010   HGBUR NEGATIVE 08/24/2017 0010   BILIRUBINUR NEGATIVE 08/24/2017 0010   KETONESUR NEGATIVE 08/24/2017 0010   PROTEINUR NEGATIVE 08/24/2017 0010   UROBILINOGEN 1.0 12/25/2014  0234   NITRITE NEGATIVE 08/24/2017 0010   LEUKOCYTESUR NEGATIVE 08/24/2017 0010   Sepsis Labs: @LABRCNTIP (procalcitonin:4,lacticidven:4) )No results found for this or any previous visit (from the past 240 hour(s)).   Radiological Exams on Admission: Dg Chest 2 View  Result Date: 02/19/2018 CLINICAL DATA:  Upper chest pain for several hours EXAM: CHEST - 2 VIEW COMPARISON:  01/24/2017 FINDINGS: Defibrillator is again seen. Cardiac shadow is stable. Lungs are clear bilaterally. No acute bony abnormality is noted. IMPRESSION: No active cardiopulmonary disease. Electronically Signed   By: Alcide Clever M.D.   On: 02/19/2018 21:53    EKG: Independently reviewed. Sinus bradycardia (rate 58), T-wave flattening and inversions similar to prior.   Assessment/Plan   1. Chest pain  - Presents with one day of chest pain, resolved with nitroglycerin in ED  - CXR unremarkable, EKG with T-wave abnormalities similar to prior, and initial troponin is undetectable  - He was treated with ASA 324 mg in ED  - Cardiology recommended medical admission for ACS r/o  - Continue cardiac monitoring, serial troponin  measurements, repeat EKG  - Continue statin, beta-blocker, ASA   2. Chronic systolic CHF  -  - EF 25-30% with mild-mod LV dilation, diffuse HK, and grade 1 diastolic dysfunction on TTE from May 2018  - He has ICD in place  - Continue Coreg, Entresto, Aldactone  - Follow daily wts   3. History of CVA  - Continue statin    DVT prophylaxis: Lovenox  Code Status: Full  Family Communication: Discussed with patient  Consults called: Cardiology consulted by ED physician  Admission status: Observation     Briscoe Deutscher, MD [facility] Pager [phone]  If 7PM-7AM, please contact night-coverage [email or web address] Password [email or web address]  02/20/2018, 2:55 AM

## 2018-02-20 NOTE — Progress Notes (Signed)
Patient and wife are anxious, and are asking about the procedure and when they will hear back from the cardiologist.  Attending MD was paged about the situation.

## 2018-02-20 NOTE — Consult Note (Addendum)
Cardiology Consult    Patient ID: Nathan Baird MRN: [id number], DOB/AGE: 04/03/1981   Admit date: 02/19/2018 Date of Consult: 02/20/2018  Primary Physician: Sebastian Ache, PA-C Primary Cardiologist: No primary care provider on file. Requesting Provider: Dr. Odie Sera  Patient Profile    Nathan Baird is a 37 y.o. male with a history of HFrEF (2014 EF 25-30%, 8/14 cMRI EF 26%), presumed NICM (non-ischemic nuc 2016), [employer] subcutaneous ICD (2018, [name]), PFO (by TEE 2014), HTN, HLD, h/o palpitations reported, and h/o TIA and ischemic right MCA stroke (on ASA), who is being seen today for the evaluation of chest pain at the request of Dr. Antionette Char.  Past Medical History   Past Medical History:  Diagnosis Date  . CHF (congestive heart failure) (HCC)   . Cough    a. PFTS 09/2012: restriction probable, further examinations recommended. Saw pulm 11/2012: placed on GERD rx and short course prednisone, planned to regroup.  . CVA (cerebral vascular accident) (HCC) 01/23/2013   tPA administered; MRI brain showed small acute R MCA infarct  . History of palpitations    2x/month for several years feels heart racing  . Nonischemic cardiomyopathy (HCC)    a. Identified 10/2012 by echo EF 20-25%. b. Echo 01/2013: EF 20-25%, TEE EF 15% with severe RV dysfunction as well.  [name] PFO (patent foramen ovale)    a. By TEE 01/2013.    Past Surgical History:  Procedure Laterality Date  . NO PAST SURGERIES    . SUBQ ICD IMPLANT N/A 01/10/2017   Procedure: SubQ ICD Implant;  Surgeon: Duke Salvia, MD;  Location: [facility] INVASIVE CV LAB;  Service: Cardiovascular;  Laterality: N/A;  . TEE WITHOUT CARDIOVERSION N/A 01/26/2013   Procedure: TRANSESOPHAGEAL ECHOCARDIOGRAM (TEE);  Surgeon: [name], MD;  Location: [facility] ENDOSCOPY;  Service: Cardiovascular;  Laterality: N/A;     Allergies  No Known Allergies  History of Present Illness   Patient with PMH as above. His cardiomyopathy  was initially diagnosed in 2014, with EF 20-25%. He was treated with medication therapy. At one point during admission in 2014 for stroke, he was placed briefly on Xarelto given concern for CVA/LV dysfunction. This was eventually changed to aspirin. He has not had a cath before. Cardiac MRI (given fam hx of sarcoidosis) in 2014 showed LVEF 26% without infiltration or scar; there was question of non-compaction but Dr. Elease Hashimoto reviewed this in follow-up with a colleague and this was not felt to be the case. In 2016 when referred to consider ICD implantation, [name] suggested ischemic eval with nuclear stress test that was non-ischemic but high risk due to low EF. Since that time, he's followed with cardiology for his cardiomyopathy and ICD implantation (BSX S-ICD implanted 2018 for NICM) for primary prevention. At last OV with [name] 07/2017, per documentation, patient presented that day for concerns at work related to his device being active and request that device be inactivated and this was done with encouragement made to ensure continue medical management. Of note, patient's device remains turned off. Outpatient medications include ASA 81mg , statin (Lipitor 10mg ), beta blocker (Coreg 25mg ), Entresto 49-51 mg, and spironolactone 25mg .   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 He reports recent stress associated with his current boss at his job as a Music therapist and due to his boss' demands, as well as less sleep d/t worry associated with this position. He was reportedly in his usual state  of health until 02/19/2018 and while driving for [employer]. He ddescribes acute onset substernal chest tightness / "achy pain" at that time, rated 6/10, while driving and without radiation. He does report associated SOB / "feeling winded" that his arms "felt heavy" and that he felt fatigued after the event. He does not report any additional associated symptoms or any exacerbating factors. He denies  associated diaphoresis, cough, fever, chills, LEE, and lower extremity tenderness. He does report he stopped to get lunch and started to feel alleviation/ less chest pain & tightness after 30 minutes of rest after eating lunch. Despite this  alleviation in chest tightness, he decided to report to [facility] given he still felt some tightness and had never experienced something like this in the past. Of note, family history significant for mother with PPM implantation at age 4 yo. Patient reports that he does not drink caffeine but instead sugar free drinks (frozen). He does describe a poor diet and often eats fast food but is active and occasionally "jogs from delivery to delivery" for workout. He reports he was physically active at a young age and has remained active with a recent loss of weight due to exercise. He also reports that he plans to move within [employer] with goal of having less stressful boss.   In the ED, vitals significant for BP 107/73, HR 78, RR 18, SpO2 94% ORA. EKG read as sinus bradycardia with HR 58 bpm, T wave changes but similar to prior EKGs. CXR negative for acute cardiopulmonary disease. Labs significant for K+ 3.6, Na+ 139, glucose 91, SCr 1.14 (Previous 01/02/17 Scr 1.15), lipase borderline 50. WBC 9.0, Hgb 15.7. BNP negative at 14.3. Troponin negative x3.  In the ED, he received nitro with fast and complete alleviation of tightness / pain and some headache. Also in ED, he was treated with 324mg  ASA and admitted with cardiology consulted for chest pain.   Since admission, patient has received ASA 81mg , Coreg 25mg , Lipitor 10mg , Entresto 49-51 po BID, Spironolactone 25mg , SL nitro 0.4mg , and lovenox injection 40mg .  Of note, discussed this case with [name] and confirmed device remains turned off with recent discussions surrounding turning device back on again and closing ASD as patient with PFO. [employer] was called and will turn back on the device.   Inpatient  Medications    . [START ON 02/21/2018] aspirin EC  81 mg Oral Daily  . atorvastatin  10 mg Oral q1800  . carvedilol  25 mg Oral BID WC  . enoxaparin (LOVENOX) injection  40 mg Subcutaneous Daily  . sacubitril-valsartan  1 tablet Oral BID  . spironolactone  25 mg Oral Daily    Family History    Family History  Problem Relation Age of Onset  . Sarcoidosis Mother   . Heart Problems Mother        PPM implantation age 24   He indicated that his mother is alive. He indicated that his father is alive.   Social History    Social History   Socioeconomic History  . Marital status: Married    Spouse name: Not on file  . Number of children: Not on file  . Years of education: Not on file  . Highest education level: Not on file  Occupational History  . Not on file  Social Needs  . Financial resource strain: Not on file  . Food insecurity:    Worry: Not on file    Inability: Not on file  .  Transportation needs:    Medical: Not on file    Non-medical: Not on file  Tobacco Use  . Smoking status: Never Smoker  . Smokeless tobacco: Never Used  Substance and Sexual Activity  . Alcohol use: Yes    Comment: occ  . Drug use: No  . Sexual activity: Not on file  Lifestyle  . Physical activity:    Days per week: Not on file    Minutes per session: Not on file  . Stress: Not on file  Relationships  . Social connections:    Talks on phone: Not on file    Gets together: Not on file    Attends religious service: Not on file    Active member of club or organization: Not on file    Attends meetings of clubs or organizations: Not on file    Relationship status: Not on file  . Intimate partner violence:    Fear of current or ex partner: Not on file    Emotionally abused: Not on file    Physically abused: Not on file    Forced sexual activity: Not on file  Other Topics Concern  . Not on file  Social History Narrative   Marital status: single     Children: 4 children (77, 57, 2,  27month old)      Lives: with girlfriend in [place].      Employment: works for city of [employer]      Exercise: basketball every Friday night.     Review of Systems    General:  No chills, fever, night sweats or weight changes (though did lose weight with exercise / jogging recently as described in HPI).  Cardiovascular:  No current chest pain, dyspnea on exertion, edema, orthopnea, palpitations, paroxysmal nocturnal dyspnea. Dermatological: No rash, lesions/masses Respiratory: No cough, dyspnea Urologic: No hematuria, dysuria Abdominal:   No nausea, vomiting, diarrhea, bright red blood per rectum, melena, or hematemesis Neurologic:  No visual changes, wkns, changes in mental status. All other systems reviewed and are otherwise negative except as noted above.  Physical Exam    Blood pressure 128/88, pulse 67, temperature 98.3 F (36.8 C), temperature source Oral, resp. rate 16, height 6\' 2"  (1.88 m), weight 118.5 kg, SpO2 100 %.  General: Pleasant, NAD. Sitting in bed watching television Psych: Normal affect. Neuro: Alert and oriented X 3. Moves all extremities spontaneously. HEENT: Normal  Neck: Supple without bruits or JVD. Lungs:  Resp regular and unlabored, CTA. Heart: Bradycardic, regular rhythm. PPM/ICD implanted Abdomen: Soft, non-tender, non-distended, BS + x 4.  Extremities: No clubbing, cyanosis or edema. DP/PT/Radials 2+ and equal bilaterally.  Labs    Troponin (Point of Care Test) No results for input(s): TROPIPOC in the last 72 hours. Recent Labs    02/19/18 2131 02/20/18 0259 02/20/18 0824  TROPONINI <0.03 <0.03 <0.03   Lab Results  Component Value Date   WBC 9.0 02/19/2018   HGB 15.7 02/19/2018   HCT 45.8 02/19/2018   MCV 87.2 02/19/2018   PLT 338 02/19/2018    Recent Labs  Lab 02/19/18 2131  NA 139  K 3.6  CL 105  CO2 24  BUN 21*  CREATININE 1.14  CALCIUM 9.7  PROT 8.0  BILITOT 0.4  ALKPHOS 54  ALT 22  AST 25    GLUCOSE 91   Lab Results  Component Value Date   CHOL 141 10/24/2016   HDL 50 10/24/2016   LDLCALC 72 10/24/2016   TRIG 97 10/24/2016  Lab Results  Component Value Date   DDIMER  05/24/2009    0.46        AT THE INHOUSE ESTABLISHED CUTOFF VALUE OF 0.48 ug/mL FEU, THIS ASSAY HAS BEEN DOCUMENTED IN THE LITERATURE TO HAVE A SENSITIVITY AND NEGATIVE PREDICTIVE VALUE OF AT LEAST 98 TO 99%.  THE TEST RESULT SHOULD BE CORRELATED WITH AN ASSESSMENT OF THE CLINICAL PROBABILITY OF DVT / VTE.     Radiology Studies    Dg Chest 2 View  Result Date: 02/19/2018 CLINICAL DATA:  Upper chest pain for several hours EXAM: CHEST - 2 VIEW COMPARISON:  01/24/2017 FINDINGS: Defibrillator is again seen. Cardiac shadow is stable. Lungs are clear bilaterally. No acute bony abnormality is noted. IMPRESSION: No active cardiopulmonary disease. Electronically Signed   By: Alcide Clever M.D.   On: 02/19/2018 21:53    ECG & Cardiac Imaging    Pertinent Cardiac Studies and Imaging / Results (bolded with details below): DATE TEST EF Notes  01/26/2013 TEE EF 15% Patent foramen ovale   02/08/13 cMRI   EF 26 is %   05/21/13 Echo 30%   12/20/13 Echo 20-25% *cannot r/o apical thrombus  05/20/14 Echo EF 15-20%   10/17/14 Echo EF 25-30%   12//15/16 Myo   34%   11/07/16 TTE EF 20-25%    11/07/2016 TTE EF 25-30% Study Conclusions - Left ventricle: The cavity size was mildly to moderately dilated.   Wall thickness was normal. Systolic function was severely   reduced. The estimated ejection fraction was in the range of 25%   to 30%. Diffuse hypokinesis. Doppler parameters are consistent   with abnormal left ventricular relaxation (grade 1 diastolic   dysfunction). - Aortic valve: There was no stenosis. - Aorta: Mildly dilated aortic root. Aortic root dimension: 42 mm   (ED). - Mitral valve: There was no significant regurgitation. - Right ventricle: The cavity size was normal. Systolic function   was  normal. - Pulmonary arteries: No complete TR doppler jet so unable to   estimate PA systolic pressure. - Inferior vena cava: The vessel was normal in size. The   respirophasic diameter changes were in the normal range (>= 50%),   consistent with normal central venous pressure. Impressions: Mild to moderate LV dilation with EF 25-30%, diffuse hypokinesis.   Normal RV size and systolic function. No significant valvular   abnormalities. This study is similar to the prior echo.  06/22/2015 Gated Myo Perf Stress  The left ventricular ejection fraction is moderately decreased (30-44%).  Nuclear stress EF: 34%.  This is a high risk study. There is no evidence of ischemia. The left ventricle is dilated with EF 34% and global hypokinesis.The study is considered high risk because of EF < 35%.  02/08/2013 cMRI 1)    Severe LVE with diffuse hypokinesis EF 26%      2)    Findings consistent with ventricular non-compaction 3)    No infiltration or scar in LV myocardium 4)    Mild RV enlargement 5)    PFO 6)    No LAA thrombus 7)    Mild LAE  01/26/2013 TEE Study Conclusions - Left ventricle: The cavity size was dilated. The estimated ejection fraction was 15%. Diffuse hypokinesis. There was mild spontaneous echo contrast, indicative of stasis. - Aortic valve: No evidence of vegetation. - Mitral valve: No evidence of vegetation. Mild regurgitation. - Left atrium: The atrium was mildly dilated. No evidence of thrombus in the atrial cavity or appendage. -  Right ventricle: The cavity size was mildly dilated. Systolic function was severely reduced. - Right atrium: The atrium was mildly dilated. - Atrial septum: There was a patent foramen ovale. - Tricuspid valve: No evidence of vegetation.  Assessment & Plan    1. Chest pain in the setting of negative troponin and EKG without STE - No current chest pain - complete alleviation with SL nitro in ED - [employer] PPM/ICD,  implanted 11-14-2016 by [name] as per HPI above.  ICD defibrillator turned off from 07/2017 --> today, 02/20/2018. [employer]called today and turned on device. - CXR and cardiac imaging and studies as above - Troponin negative x3 - EKG without STE - ACS ruled out given negative troponin and EKG - Recommend repeat EKG s/p device turned back on  - Continue to monitor on telemetry s/p turning on device and given brady rates as above - Consider anticoagulation medication at discharge  - Consider referral to the Heart Failure Clinic given low EF - Continue ASA 81mg  po qd, Lipitor 10mg  qPM, Corge 25mg  po BID, Lovenox, Entresto 49-51 mg  Bid po, Spironolactone 25mg  po qd - Continue SL nitro 0.4mg  q26m x3 - Scr 1.14, Potassium 3.6 -  monitor renal function and electrolytes with daily BMP - Daily BMP, CBC - Lipid panel recommended - TSH recommended - Discussed case with [name]. Current discussion regarding ?ASD closure. Reviewed this with patient and he seems open to this procedure if needed.   - Seen with Dr. Tresa Endo with recommendation for coronary CT with ango (cardiac CT) - pending results, may need further evaluation with catheterization. Patient has eaten already, so will start NPO overnight with plans for cardiac CT tomorrow.   2. History of CVA - See HPI above - On ASA. Continue.   For questions or updates, please contact [facility] Please consult [email or web address] for contact info under Cardiology/STEMI.      Lynelle Smoke, PA-C  Pager [phone] 02/20/2018, 1:48 PM     Patient seen and examined. Agree with assessment and plan.  Nathan Baird is a 37 year old African-American male who has a history of documented cardiomyopathy felt to be nonischemic since 11-14-2012.  He underwent percutaneous ICD implantation by [name] in 11-14-2016 for sudden cardiac death phylaxis.  Due to a family history of sarcoidosis, an MRI was performed which did not reveal any definitive  infiltrative process. On prior echo the patient had been found to have a PFO without ASD and not been demonstrated to have definitive apical thrombus.  He had suffered a TIA/ischemic right MCA stroke while on aspirin.  He was subsequently was put on Xarelto per recommendation of neurology which he had taken some time but ultimately this was discontinued.  He denies any exertional chest pain.  He admits that he does not have a great diet.  He apparently had his defibrillator turned off since January was supposed to get a turn back on 1 week later but apparently he never followed up with this and it had been off until his presentation.  Yesterday while driving he experienced a chest pressure and tightness.  He had eaten a hamburger and [place] fries in the morning.  He did note some relief when given sublingual nitroglycerin upon presentation.  He denies any recurrence of his chest pressure since being in the hospital.  On exam he is well-developed muscular in no acute distress.  Pressure is stable pulse regular not ectopy.  HEENT is unremarkable.  He did  not have JVD.  His lungs were clear.  There was no chest wall tenderness to be deep palpation.  Rhythm was regular with no S3 gallop.  Abdomen was soft and nontender.  Pulses were 2+.  There was no edema clubbing or cyanosis.  Neurologic exam is grossly nonfocal.  ECG shows normal sinus rhythm at 82 bpm, there are nonspecific T changes in leads II 3 and F, V5 and V6.  QTc interval is 397 ms. When I walked into the room to see the patient, he is eating hot wings.  He will ultimately need an ischemic evaluation.  Troponins have been negative.  Plan for repeat echo Doppler study to reassess LV size and function.  May need TEE to reassess PFO.  I will schedule the patient forced coronary CT angiogram tomorrow to evaluate potential coronary plaque and if significant definitive cardiac catheterization should be performed.   Lennette Bihari, MD, [facility] 02/20/2018 4:51  PM

## 2018-02-20 NOTE — Progress Notes (Signed)
[facility] PROGRESS NOTE  ALTARIQ SALAZ OJJ:[id number] DOB: 04-09-1981 DOA: 02/19/2018 PCP: Sebastian Ache, PA-C  Assessment/Plan:HPI: Nathan Baird is a 37 y.o. male with medical history significant for cardiomyopathy with EF 25-30% and ICD, PFO, and history of ischemic right MCA stroke, now presenting for evaluation of chest pain.  Patient had been in his usual state of health until around noon on 02/19/2018 when he was driving and developed acute onset of substernal chest pain.  He describes this as 6/10 in severity, pressure-like in character, radiating to both arms, associated with diaphoresis, and no exacerbating or alleviating factors identified.  He denies any cough, shortness of breath, fevers, or chills.  He denies any lower extremity swelling or tenderness.  He had not experienced these symptoms previously.  He had a stress test in 2016 without evidence for ischemia, but was a high risk study due to EF <35%.  He follows with cardiology for cardiomyopathy and has ICD.  ED Course: Upon arrival to the ED, patient is found to be afebrile, saturating well on room air, and with vitals otherwise stable.  EKG features a sinus bradycardia with rate 58, T wave flattening and inversions, similar to prior.  Chest x-ray is negative for acute cardiopulmonary disease.  Chemistry panel and CBC are unremarkable, BNP is normal, and troponin is undetectable.  Her pain resolved with nitroglycerin in the ED.  Patient was treated with 324 mg of aspirin.  ED physician discussed case with cardiology and medical admission was recommended for ACS rule out.   1-Chest pain, multiples risk factor, cardiomyopathy Ef 25- % ICD, PFO.  Troponin negative.  T wave flat on EKG.  Cardiology consulted.   Chronic systolic HF;  Continue with entresto, aldactone.  Hold coreg for bradycardia.    History of CVA;  On statins.   Code Status: Full code.  Family Communication: wife who was at bedside.   Disposition Plan:    Consultants:  Cardiology   Procedures:  ECHO   Antibiotics:  none  HPI/Subjective: Denies chest pain.  Denies dyspnea.   Objective: Vitals:   02/20/18 0139 02/20/18 0337  BP: 107/84 118/87  Pulse: 72 (!) 54  Resp: 19 16  Temp: 98.2 F (36.8 C) 97.7 F (36.5 C)  SpO2: 98% 99%   No intake or output data in the 24 hours ending 02/20/18 0812 Filed Weights   02/19/18 2119 02/20/18 0139  Weight: 117.5 kg 118.5 kg    Exam:   General:  NAD  Cardiovascular: S 1, S 2 RRR  Respiratory: Normal respiratory effort. CTA  Abdomen: BS present, soft, nt  Musculoskeletal: no edema  Data Reviewed: Basic Metabolic Panel: Recent Labs  Lab 02/19/18 2131  NA 139  K 3.6  CL 105  CO2 24  GLUCOSE 91  BUN 21*  CREATININE 1.14  CALCIUM 9.7   Liver Function Tests: Recent Labs  Lab 02/19/18 2131  AST 25  ALT 22  ALKPHOS 54  BILITOT 0.4  PROT 8.0  ALBUMIN 4.4   Recent Labs  Lab 02/19/18 2131  LIPASE 50   No results for input(s): AMMONIA in the last 168 hours. CBC: Recent Labs  Lab 02/19/18 2131  WBC 9.0  HGB 15.7  HCT 45.8  MCV 87.2  PLT 338   Cardiac Enzymes: Recent Labs  Lab 02/19/18 2131 02/20/18 0259  TROPONINI <0.03 <0.03   BNP (last 3 results) Recent Labs    02/19/18 2131  BNP 14.3    ProBNP (last 3  results) No results for input(s): PROBNP in the last 8760 hours.  CBG: No results for input(s): GLUCAP in the last 168 hours.  No results found for this or any previous visit (from the past 240 hour(s)).   Studies: Dg Chest 2 View  Result Date: 02/19/2018 CLINICAL DATA:  Upper chest pain for several hours EXAM: CHEST - 2 VIEW COMPARISON:  01/24/2017 FINDINGS: Defibrillator is again seen. Cardiac shadow is stable. Lungs are clear bilaterally. No acute bony abnormality is noted. IMPRESSION: No active cardiopulmonary disease. Electronically Signed   By: Alcide Clever M.D.   On: 02/19/2018 21:53    Scheduled  Meds: . [START ON 02/21/2018] aspirin EC  81 mg Oral Daily  . atorvastatin  10 mg Oral q1800  . carvedilol  25 mg Oral BID WC  . enoxaparin (LOVENOX) injection  40 mg Subcutaneous Daily  . sacubitril-valsartan  1 tablet Oral BID  . spironolactone  25 mg Oral Daily   Continuous Infusions:  Principal Problem:   Chest pain Active Problems:   Chronic systolic congestive heart failure (HCC)   History of CVA (cerebrovascular accident)    Time spent: 35 minutes.     Tranae Laramie A Richetta Cubillos  [facility] Pager [phone]. If 7PM-7AM, please contact night-coverage at [email or web address], password [facility] 02/20/2018, 8:12 AM  LOS: 0 days

## 2018-02-21 ENCOUNTER — Observation Stay (HOSPITAL_BASED_OUTPATIENT_CLINIC_OR_DEPARTMENT_OTHER): Payer: BLUE CROSS/BLUE SHIELD

## 2018-02-21 ENCOUNTER — Encounter (HOSPITAL_COMMUNITY): Payer: Self-pay | Admitting: Radiology

## 2018-02-21 ENCOUNTER — Observation Stay (HOSPITAL_COMMUNITY): Payer: BLUE CROSS/BLUE SHIELD

## 2018-02-21 DIAGNOSIS — I429 Cardiomyopathy, unspecified: Secondary | ICD-10-CM | POA: Diagnosis not present

## 2018-02-21 DIAGNOSIS — R0789 Other chest pain: Secondary | ICD-10-CM

## 2018-02-21 DIAGNOSIS — R072 Precordial pain: Secondary | ICD-10-CM | POA: Diagnosis not present

## 2018-02-21 DIAGNOSIS — I5022 Chronic systolic (congestive) heart failure: Secondary | ICD-10-CM | POA: Diagnosis not present

## 2018-02-21 DIAGNOSIS — Z95810 Presence of automatic (implantable) cardiac defibrillator: Secondary | ICD-10-CM

## 2018-02-21 DIAGNOSIS — I110 Hypertensive heart disease with heart failure: Secondary | ICD-10-CM | POA: Diagnosis not present

## 2018-02-21 DIAGNOSIS — R079 Chest pain, unspecified: Secondary | ICD-10-CM | POA: Diagnosis not present

## 2018-02-21 DIAGNOSIS — Z8673 Personal history of transient ischemic attack (TIA), and cerebral infarction without residual deficits: Secondary | ICD-10-CM | POA: Diagnosis not present

## 2018-02-21 LAB — LIPID PANEL
CHOLESTEROL: 139 mg/dL (ref 0–200)
HDL: 43 mg/dL (ref >40)
LDL CALC: 77 mg/dL (ref 0–99)
TRIGLYCERIDES: 96 mg/dL (ref <150)
Total CHOL/HDL Ratio: 3.2 RATIO
VLDL: 19 mg/dL (ref 0–40)

## 2018-02-21 LAB — CBC
HCT: 47.8 % (ref 39.0–52.0)
HEMOGLOBIN: 15.8 g/dL (ref 13.0–17.0)
MCH: 29.5 pg (ref 26.0–34.0)
MCHC: 33.1 g/dL (ref 30.0–36.0)
MCV: 89.2 fL (ref 78.0–100.0)
PLATELETS: 322 10*3/uL (ref 150–400)
RBC: 5.36 MIL/uL (ref 4.22–5.81)
RDW: 12.5 % (ref 11.5–15.5)
WBC: 7.5 10*3/uL (ref 4.0–10.5)

## 2018-02-21 LAB — HEPATIC FUNCTION PANEL
ALBUMIN: 3.6 g/dL (ref 3.5–5.0)
ALK PHOS: 51 U/L (ref 38–126)
ALT: 18 U/L (ref 0–44)
AST: 17 U/L (ref 15–41)
BILIRUBIN INDIRECT: 0.4 mg/dL (ref 0.3–0.9)
Bilirubin, Direct: 0.2 mg/dL (ref 0.0–0.2)
Total Bilirubin: 0.6 mg/dL (ref 0.3–1.2)
Total Protein: 6.7 g/dL (ref 6.5–8.1)

## 2018-02-21 LAB — BASIC METABOLIC PANEL
Anion gap: 8 (ref 5–15)
BUN: 19 mg/dL (ref 6–20)
CHLORIDE: 106 mmol/L (ref 98–111)
CO2: 23 mmol/L (ref 22–32)
Calcium: 9.1 mg/dL (ref 8.9–10.3)
Creatinine, Ser: 1.21 mg/dL (ref 0.61–1.24)
Glucose, Bld: 97 mg/dL (ref 70–99)
POTASSIUM: 3.7 mmol/L (ref 3.5–5.1)
SODIUM: 137 mmol/L (ref 135–145)

## 2018-02-21 LAB — ECHOCARDIOGRAM COMPLETE
Height: 74 in
Weight: 4123.2 oz

## 2018-02-21 LAB — TSH: TSH: 1.168 u[IU]/mL (ref 0.350–4.500)

## 2018-02-21 MED ORDER — CARVEDILOL 12.5 MG PO TABS: 12.5000 mg | ORAL_TABLET | Freq: Two times a day (BID) | ORAL | 0 refills | Status: DC

## 2018-02-21 MED ORDER — NITROGLYCERIN 0.4 MG SL SUBL
SUBLINGUAL_TABLET | SUBLINGUAL | Status: AC
  Filled 2018-02-21: qty 2

## 2018-02-21 MED ORDER — NITROGLYCERIN 0.4 MG SL SUBL
0.8000 mg | SUBLINGUAL_TABLET | Freq: Once | SUBLINGUAL | Status: AC
  Administered 2018-02-21: 0.8 mg via SUBLINGUAL

## 2018-02-21 MED ORDER — IOPAMIDOL (ISOVUE-370) INJECTION 76%
80.0000 mL | Freq: Once | INTRAVENOUS | Status: AC | PRN
  Administered 2018-02-21: 80 mL via INTRAVENOUS

## 2018-02-21 MED ORDER — NITROGLYCERIN 0.4 MG SL SUBL: 0.4000 mg | SUBLINGUAL_TABLET | SUBLINGUAL | 12 refills | Status: DC | PRN

## 2018-02-21 NOTE — Progress Notes (Signed)
Progress Note  Patient Name: Nathan Baird Date of Encounter: 02/21/2018  Primary Cardiologist: Dr. Elease Hashimoto Primary EP: [name]  Subjective   He is doing well this morning and denies chest pain, palpitations, and shortness of breath.  Echocardiogram is about to be performed.  Inpatient Medications    Scheduled Meds: . aspirin EC  81 mg Oral Daily  . atorvastatin  10 mg Oral q1800  . carvedilol  25 mg Oral BID WC  . enoxaparin (LOVENOX) injection  40 mg Subcutaneous Daily  . sacubitril-valsartan  1 tablet Oral BID  . spironolactone  25 mg Oral Daily   Continuous Infusions:  PRN Meds: acetaminophen, cyclobenzaprine, morphine injection, nitroGLYCERIN, ondansetron (ZOFRAN) IV   Vital Signs    Vitals:   02/20/18 1650 02/20/18 1650 02/20/18 2044 02/21/18 0622  BP: 109/74 109/74 100/73 106/77  Pulse: 64 64 66 (!) 59  Resp:   17 17  Temp: 98.4 F (36.9 C) 98.4 F (36.9 C) 98.2 F (36.8 C) 98.1 F (36.7 C)  TempSrc: Oral Oral Oral Oral  SpO2: 98% 98% 99% 98%  Weight:    116.9 kg  Height:       No intake or output data in the 24 hours ending 02/21/18 1059 Filed Weights   02/19/18 2119 02/20/18 0139 02/21/18 0622  Weight: 117.5 kg 118.5 kg 116.9 kg    Telemetry    Sinus rhythm and sinus bradycardia- Personally Reviewed  ECG    Sinus rhythm with nonspecific T wave abnormalities- Personally Reviewed  Physical Exam   GEN: No acute distress.   Neck: No JVD Cardiac: RRR, no murmurs, rubs, or gallops.  Respiratory: Clear to auscultation bilaterally. GI: Soft, nontender, non-distended  MS: No edema; No deformity. Neuro:  Nonfocal  Psych: Normal affect   Labs    Chemistry Recent Labs  Lab 02/19/18 2131 02/21/18 0522  NA 139 137  K 3.6 3.7  CL 105 106  CO2 24 23  GLUCOSE 91 97  BUN 21* 19  CREATININE 1.14 1.21  CALCIUM 9.7 9.1  PROT 8.0 6.7  ALBUMIN 4.4 3.6  AST 25 17  ALT 22 18  ALKPHOS 54 51  BILITOT 0.4 0.6  GFRNONAA >60 >60  GFRAA >60  >60  ANIONGAP 10 8     Hematology Recent Labs  Lab 02/19/18 2131 02/21/18 0522  WBC 9.0 7.5  RBC 5.25 5.36  HGB 15.7 15.8  HCT 45.8 47.8  MCV 87.2 89.2  MCH 29.9 29.5  MCHC 34.3 33.1  RDW 12.9 12.5  PLT 338 322    Cardiac Enzymes Recent Labs  Lab 02/19/18 2131 02/20/18 0259 02/20/18 0824 02/20/18 1425  TROPONINI <0.03 <0.03 <0.03 <0.03   No results for input(s): TROPIPOC in the last 168 hours.   BNP Recent Labs  Lab 02/19/18 2131  BNP 14.3     DDimer No results for input(s): DDIMER in the last 168 hours.   Radiology    Dg Chest 2 View  Result Date: 02/19/2018 CLINICAL DATA:  Upper chest pain for several hours EXAM: CHEST - 2 VIEW COMPARISON:  01/24/2017 FINDINGS: Defibrillator is again seen. Cardiac shadow is stable. Lungs are clear bilaterally. No acute bony abnormality is noted. IMPRESSION: No active cardiopulmonary disease. Electronically Signed   By: Alcide Clever M.D.   On: 02/19/2018 21:53    Cardiac Studies   Echocardiogram pending  Coronary CT angiogram pending  See prior studies and consult note dated 02/20/2018  Patient Profile  37 y.o. male with a history of HFrEF (2014 EF 25-30%, 8/14 cMRI EF 26%), presumed NICM (non-ischemic nuc 2016), [employer] subcutaneous ICD (2018, [name]), PFO (by TEE 2014), HTN, HLD, h/o palpitations reported, and h/o TIA and ischemic right MCA stroke (on ASA), who is being seen today for the evaluation of chest pain at the request of Dr. Antionette Char.  Assessment & Plan    1.  Chest pain: He has ruled out for an acute coronary syndrome.  He has had no symptom recurrence.  Chest pain was alleviated in the ED with nitroglycerin.  He has been scheduled for coronary CT angiography by Dr. Tresa Endo.  Continue aspirin, Lipitor, and carvedilol.  It does not appear he has ever had a coronary angiogram.  2.  Chronic systolic heart failure: He appears euvolemic.  He has a nonischemic cardiomyopathy.  Continue carvedilol,  Entresto, and spironolactone.  Repeat echocardiogram is being performed.  3.  History of CVA: Currently on aspirin.  It appears he previously sustained a stroke while on aspirin and was subsequently put on Xarelto by neurology but this appears to have been discontinued.  Also on Lipitor.  4.  ICD: Normal device function.  No shocks.  For questions or updates, please contact [facility] Please consult [email or web address] for contact info under Cardiology/STEMI.      Signed, Prentice Docker, MD  02/21/2018, 10:59 AM

## 2018-02-21 NOTE — Plan of Care (Signed)
  Problem: Activity: Goal: Risk for activity intolerance will decrease Outcome: Completed/Met

## 2018-02-21 NOTE — Progress Notes (Signed)
  Echocardiogram Echocardiogram has been performed.  Nathan Baird 02/21/2018, 11:33 AM

## 2018-02-21 NOTE — Discharge Summary (Signed)
Physician Discharge Summary  OLOF MARCIL WUJ:[id number] DOB: April 27, 1981 DOA: 02/19/2018  PCP: Sebastian Ache, PA-C  Admit date: 02/19/2018 Discharge date: 02/21/2018  Admitted From: Home  Disposition: home  Recommendations for Outpatient Follow-up:  1. Follow up with PCP in 1-2 weeks 2. Please obtain BMP/CBC in one week 3. Follow up with Dr Graciela Husbands.     Discharge Condition: stable.  CODE STATUS: full code.  Diet recommendation: Heart Healthy   Brief/Interim Summary: NFA:OZHY J Newkirkis a 37 y.o.malewith medical history significant forcardiomyopathy with EF 25-30% and ICD, PFO, and history of ischemic right MCA stroke, now presenting for evaluation of chest pain.Patient had been in his usual state of health until around noon on 02/19/2018 when he was driving and developed acute onset of substernal chest pain. He describes this as 6/10 in severity, pressure-like in character, radiating to both arms, associated with diaphoresis, and no exacerbating or alleviating factors identified. He denies any cough, shortness of breath, fevers, or chills. He denies any lower extremity swelling or tenderness. He had not experienced these symptoms previously. He had a stress test in 2016 without evidence for ischemia, but was a high risk study due to EF <35%.He follows with cardiology for cardiomyopathy and has ICD.  ED Course:Upon arrival to the ED, patient is found to be afebrile, saturating well on room air, and with vitals otherwise stable. EKG features a sinus bradycardia with rate 58, T wave flattening and inversions, similar to prior. Chest x-ray is negative for acute cardiopulmonary disease. Chemistry panel and CBC are unremarkable, BNP is normal, and troponin is undetectable. Her pain resolved with nitroglycerin in the ED. Patient was treated with 324 mg of aspirin. ED physician discussed case with cardiology and medical admission was recommended for ACS rule  out.   1-Chest pain, multiples risk factor, cardiomyopathy Ef 25- % ICD, PFO.  Troponin negative.  T wave flat on EKG.  Cardiology consulted.  Coronary CT negative, ECHO similar than before.  chest pain free. Plan to discharge home today   Chronic systolic HF;  Continue with entresto, aldactone.  Carvedilol reduce to 12.5 BID, due to bradycardia follow up with cardiology for PFO closure and HF management.   History of CVA;  On statins.   Discharge Diagnoses:  Principal Problem:   Chest pain Active Problems:   Chronic systolic congestive heart failure (HCC)   History of CVA (cerebrovascular accident)    Discharge Instructions  Discharge Instructions    Diet - low sodium heart healthy   Complete by:  As directed    Increase activity slowly   Complete by:  As directed      Allergies as of 02/21/2018   No Known Allergies     Medication List    STOP taking these medications   cyclobenzaprine 10 MG tablet Commonly known as:  FLEXERIL     TAKE these medications   atorvastatin 10 MG tablet Commonly known as:  LIPITOR Take 1 tablet (10 mg total) by mouth daily at 6 PM. What changed:  when to take this   carvedilol 12.5 MG tablet Commonly known as:  COREG Take 1 tablet (12.5 mg total) by mouth 2 (two) times daily with a meal. What changed:    medication strength  how much to take  when to take this   ENTRESTO 49-51 MG Generic drug:  sacubitril-valsartan TAKE 1 TABLET BY MOUTH TWICE DAILY` What changed:  See the new instructions.   nitroGLYCERIN 0.4 MG SL tablet Commonly known as:  NITROSTAT Place 1 tablet (0.4 mg total) under the tongue every 5 (five) minutes x 3 doses as needed for chest pain.   spironolactone 25 MG tablet Commonly known as:  ALDACTONE TAKE 1 TABLET BY MOUTH EVERY DAY What changed:  when to take this       No Known Allergies  Consultations: Cardiology   Procedures/Studies: Dg Chest 2 View  Result Date:  02/19/2018 CLINICAL DATA:  Upper chest pain for several hours EXAM: CHEST - 2 VIEW COMPARISON:  01/24/2017 FINDINGS: Defibrillator is again seen. Cardiac shadow is stable. Lungs are clear bilaterally. No acute bony abnormality is noted. IMPRESSION: No active cardiopulmonary disease. Electronically Signed   By: Alcide Clever M.D.   On: 02/19/2018 21:53   Ct Coronary Morph W/cta Cor W/score W/ca W/cm &/or Wo/cm  Result Date: 02/21/2018 CLINICAL DATA:  37 year old male with atypical chest pain and h/o non-ischemic cardiomyopathy with severe left ventricular dysfunction, h/o CVA and possible PFO. EXAM: Cardiac/Coronary  CT TECHNIQUE: The patient was scanned on a [employer]. FINDINGS: A 120 kV prospective scan was triggered in the descending thoracic aorta at 111 HU's. Axial non-contrast 3 mm slices were carried out through the heart. The data set was analyzed on a dedicated work station and scored using the Agatson method. Gantry rotation speed was 250 msecs and collimation was .6 mm. No beta blockade and 0.8 mg of sl NTG was given. The 3D data set was reconstructed in 5% intervals of the 67-82 % of the R-R cycle. Diastolic phases were analyzed on a dedicated work station using MPR, MIP and VRT modes. The patient received 80 cc of contrast. Aorta: Normal size. Mild calcifications in the aortic arch. No dissection. Aortic Valve:  Trileaflet.  No calcifications. Coronary Arteries:  Normal coronary origin.  Right dominance. RCA is a medium size dominant artery that gives rise to PDA and PLVB. There is no plaque. Left main is a large artery that gives rise to LAD and LCX arteries. Left main has no plaque. LAD is a large vessel that gives rise to one diagonal artery, wraps around the apex and continues in the in the PDA territory. There is no plaque. LCX is a non-dominant artery that gives rise to two OM branches. There is no plaque. IMPRESSION: 1. Coronary calcium score of 0. This was 0 percentile for age and  sex matched control. 2. Normal coronary origin with right dominance. 3. No evidence of CAD. 4. Dilated pulmonary artery measuring 36 mm suggestive of pulmonary hypertension. 5. No thrombus in the left atrial appendage. 6. Patent foramen ovale is seen. 7. 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 Electronically Signed   By: Tobias Alexander   On: 02/21/2018 14:21      Subjective: Feeling well, denies chest pain   Discharge Exam: Vitals:   02/21/18 0622 02/21/18 1208  BP: 106/77 111/83  Pulse: (!) 59 62  Resp: 17 20  Temp: 98.1 F (36.7 C) 98.4 F (36.9 C)  SpO2: 98% 98%   Vitals:   02/20/18 1650 02/20/18 2044 02/21/18 0622 02/21/18 1208  BP: 109/74 100/73 106/77 111/83  Pulse: 64 66 (!) 59 62  Resp:  17 17 20   Temp: 98.4 F (36.9 C) 98.2 F (36.8 C) 98.1 F (36.7 C) 98.4 F (36.9 C)  TempSrc: Oral Oral Oral Oral  SpO2: 98% 99% 98% 98%  Weight:   116.9 kg   Height:        General:  Pt is alert, awake, not in acute distress Cardiovascular: RRR, S1/S2 +, no rubs, no gallops Respiratory: CTA bilaterally, no wheezing, no rhonchi Abdominal: Soft, NT, ND, bowel sounds + Extremities: no edema, no cyanosis    The results of significant diagnostics from this hospitalization (including imaging, microbiology, ancillary and laboratory) are listed below for reference.     Microbiology: No results found for this or any previous visit (from the past 240 hour(s)).   Labs: BNP (last 3 results) Recent Labs    02/19/18 2131  BNP 14.3   Basic Metabolic Panel: Recent Labs  Lab 02/19/18 2131 02/21/18 0522  NA 139 137  K 3.6 3.7  CL 105 106  CO2 24 23  GLUCOSE 91 97  BUN 21* 19  CREATININE 1.14 1.21  CALCIUM 9.7 9.1   Liver Function Tests: Recent Labs  Lab 02/19/18 2131 02/21/18 0522  AST 25 17  ALT 22 18  ALKPHOS 54 51  BILITOT 0.4 0.6  PROT 8.0 6.7  ALBUMIN 4.4 3.6   Recent Labs  Lab 02/19/18 2131   LIPASE 50   No results for input(s): AMMONIA in the last 168 hours. CBC: Recent Labs  Lab 02/19/18 2131 02/21/18 0522  WBC 9.0 7.5  HGB 15.7 15.8  HCT 45.8 47.8  MCV 87.2 89.2  PLT 338 322   Cardiac Enzymes: Recent Labs  Lab 02/19/18 2131 02/20/18 0259 02/20/18 0824 02/20/18 1425  TROPONINI <0.03 <0.03 <0.03 <0.03   BNP: Invalid input(s): POCBNP CBG: No results for input(s): GLUCAP in the last 168 hours. D-Dimer No results for input(s): DDIMER in the last 72 hours. Hgb A1c No results for input(s): HGBA1C in the last 72 hours. Lipid Profile Recent Labs    02/21/18 0522  CHOL 139  HDL 43  LDLCALC 77  TRIG 96  CHOLHDL 3.2   Thyroid function studies Recent Labs    02/21/18 0522  TSH 1.168   Anemia work up No results for input(s): VITAMINB12, FOLATE, FERRITIN, TIBC, IRON, RETICCTPCT in the last 72 hours. Urinalysis    Component Value Date/Time   COLORURINE YELLOW 08/24/2017 0010   APPEARANCEUR CLEAR 08/24/2017 0010   LABSPEC 1.025 08/24/2017 0010   PHURINE 6.5 08/24/2017 0010   GLUCOSEU NEGATIVE 08/24/2017 0010   HGBUR NEGATIVE 08/24/2017 0010   BILIRUBINUR NEGATIVE 08/24/2017 0010   KETONESUR NEGATIVE 08/24/2017 0010   PROTEINUR NEGATIVE 08/24/2017 0010   UROBILINOGEN 1.0 12/25/2014 0234   NITRITE NEGATIVE 08/24/2017 0010   LEUKOCYTESUR NEGATIVE 08/24/2017 0010   Sepsis Labs Invalid input(s): PROCALCITONIN,  WBC,  LACTICIDVEN Microbiology No results found for this or any previous visit (from the past 240 hour(s)).   Time coordinating discharge: 35 minutes.   SIGNED:   Alba Cory, MD  [facility] 02/21/2018, 2:37 PM Pager   If 7PM-7AM, please contact night-coverage [email or web address] Password [email or web address]

## 2018-02-21 NOTE — Progress Notes (Signed)
Discharge instructions given to patient and his wife at bedside, all questions answered.

## 2018-03-02 ENCOUNTER — Telehealth: Payer: Self-pay | Admitting: Internal Medicine

## 2018-03-02 ENCOUNTER — Telehealth: Payer: Self-pay | Admitting: Cardiovascular Disease

## 2018-03-02 NOTE — Telephone Encounter (Signed)
New Message    Patient is calling because he is needing a letter that indicates his medical condition. He says that it needs to advise if he has congestive heart failure and his ejection fraction. Please call to discuss.

## 2018-03-02 NOTE — Telephone Encounter (Signed)
1st attempted TCM call: I left a message asking the pt to call me back.

## 2018-03-02 NOTE — Telephone Encounter (Signed)
New Message:     TOC appt 9/6 at 8:45 with Alben Spittle per Diona Fanti

## 2018-03-02 NOTE — Telephone Encounter (Signed)
Patient contacted regarding discharge from [facility] on 02/21/18.  Patient understands to follow up with provider Tereso Newcomer, PA-c on 03/13/18 at 8:45 at [address] [facility] [address] in [place]. Patient understands discharge instructions? Yes Patient understands medications and regiment? Yes Patient understands to bring all medications to this visit? Yes

## 2018-03-03 NOTE — Telephone Encounter (Signed)
° °  Patient calling to request letter to give to employer regarding diagnosis

## 2018-03-04 ENCOUNTER — Encounter: Payer: Self-pay | Admitting: Internal Medicine

## 2018-03-04 NOTE — Telephone Encounter (Signed)
LVM for return call. 

## 2018-03-06 NOTE — Telephone Encounter (Signed)
Letter has been composed, see telephone encounter dated 03/02/18

## 2018-03-13 ENCOUNTER — Ambulatory Visit: Payer: Self-pay | Admitting: Physician Assistant

## 2018-04-30 ENCOUNTER — Telehealth: Payer: Self-pay

## 2018-04-30 ENCOUNTER — Ambulatory Visit (INDEPENDENT_AMBULATORY_CARE_PROVIDER_SITE_OTHER): Payer: BLUE CROSS/BLUE SHIELD | Admitting: *Deleted

## 2018-04-30 DIAGNOSIS — I428 Other cardiomyopathies: Secondary | ICD-10-CM

## 2018-04-30 NOTE — Progress Notes (Signed)
Remote ICD transmission.   

## 2018-04-30 NOTE — Telephone Encounter (Signed)
LMOVM reminding pt to send remote transmission.   

## 2018-06-30 ENCOUNTER — Telehealth: Payer: Self-pay | Admitting: *Deleted

## 2018-06-30 NOTE — Telephone Encounter (Signed)
Alert reviewed for AF episodes on patient SICD on 12/22 and 12/23. SK recommended f/u on 12/26 @ 1200.   Called and informed patient about episodes of AF. I informed him of what AF is and that he needs an appt to discuss further. Patient verbalized understanding and agrees to f/u on 12/26.

## 2018-07-02 ENCOUNTER — Ambulatory Visit (INDEPENDENT_AMBULATORY_CARE_PROVIDER_SITE_OTHER): Payer: BLUE CROSS/BLUE SHIELD | Admitting: Internal Medicine

## 2018-07-02 ENCOUNTER — Encounter: Payer: Self-pay | Admitting: Internal Medicine

## 2018-07-02 VITALS — BP 120/80 | HR 77 | Ht 74.0 in | Wt 251.6 lb

## 2018-07-02 DIAGNOSIS — I5022 Chronic systolic (congestive) heart failure: Secondary | ICD-10-CM

## 2018-07-02 DIAGNOSIS — I428 Other cardiomyopathies: Secondary | ICD-10-CM

## 2018-07-02 DIAGNOSIS — I480 Paroxysmal atrial fibrillation: Secondary | ICD-10-CM

## 2018-07-02 DIAGNOSIS — Z95810 Presence of automatic (implantable) cardiac defibrillator: Secondary | ICD-10-CM | POA: Diagnosis not present

## 2018-07-02 LAB — CUP PACEART INCLINIC DEVICE CHECK
Implantable Lead Location: 753862
Implantable Lead Model: 3401
Implantable Lead Serial Number: 114025
Implantable Pulse Generator Implant Date: 20180706
MDC IDC LEAD IMPLANT DT: 20180706
MDC IDC PG SERIAL: 228369
MDC IDC SESS DTM: 20191226192334

## 2018-07-02 MED ORDER — APIXABAN 5 MG PO TABS: 5.0000 mg | ORAL_TABLET | Freq: Two times a day (BID) | ORAL | 11 refills | Status: DC

## 2018-07-02 NOTE — Progress Notes (Signed)
Patient Care Team: McVey, Madelaine Bhat, PA-C as PCP - General (Physician Assistant) Nahser, Deloris Ping, MD as Attending Physician (Cardiology)   HPI  Nathan Baird is a 37 y.o. male Seen in follow-up for ICD implanted for primary prevention in the setting of nonischemic cardiomyopathy since [name] dysfunction and modest class II heart failure.  He has a history of a prior TIA currently is treated with aspirin.  Interval device interrogation has demonstrated atrial fibrillation.  Was quite symptomatic.  No chest pain or shortness of breath; no edema  Date Cr K Mg  6/18  1.15 4.0     8/19 1.21 3.7    DATE TEST EF   8/14 cMRI  26  %   4/16 Echo   25-30 %   5/18 Echo  20-25%      Thromboembolic risk factors ( , HTN-1, TIA/CVA-2, CHF-1) for a CHADSVASc Score of 4    Past Medical History:  Diagnosis Date  . CHF (congestive heart failure) (HCC)   . Cough    a. PFTS 09/2012: restriction probable, further examinations recommended. Saw pulm 11/2012: placed on GERD rx and short course prednisone, planned to regroup.  . CVA (cerebral vascular accident) (HCC) 01/23/2013   tPA administered; MRI brain showed small acute R MCA infarct  . History of palpitations    2x/month for several years feels heart racing  . Nonischemic cardiomyopathy (HCC)    a. Identified 10/2012 by echo EF 20-25%. b. Echo 01/2013: EF 20-25%, TEE EF 15% with severe RV dysfunction as well.  [name] PFO (patent foramen ovale)    a. By TEE 01/2013.    Past Surgical History:  Procedure Laterality Date  . NO PAST SURGERIES    . SUBQ ICD IMPLANT N/A 01/10/2017   Procedure: SubQ ICD Implant;  Surgeon: Duke Salvia, MD;  Location: [facility] INVASIVE CV LAB;  Service: Cardiovascular;  Laterality: N/A;  . TEE WITHOUT CARDIOVERSION N/A 01/26/2013   Procedure: TRANSESOPHAGEAL ECHOCARDIOGRAM (TEE);  Surgeon: [name], MD;  Location: [facility] ENDOSCOPY;  Service: Cardiovascular;  Laterality: N/A;    Current Outpatient  Medications  Medication Sig Dispense Refill  . atorvastatin (LIPITOR) 10 MG tablet Take 1 tablet (10 mg total) by mouth daily at 6 PM. (Patient taking differently: Take 10 mg by mouth every morning. ) 90 tablet 3  . carvedilol (COREG) 12.5 MG tablet Take 1 tablet (12.5 mg total) by mouth 2 (two) times daily with a meal. 30 tablet 0  . ENTRESTO 49-51 MG TAKE 1 TABLET BY MOUTH TWICE DAILY` (Patient taking differently: Take 1 tablet by mouth 2 (two) times daily. ) 180 tablet 2  . nitroGLYCERIN (NITROSTAT) 0.4 MG SL tablet Place 1 tablet (0.4 mg total) under the tongue every 5 (five) minutes x 3 doses as needed for chest pain. 10 tablet 12  . spironolactone (ALDACTONE) 25 MG tablet TAKE 1 TABLET BY MOUTH EVERY DAY (Patient taking differently: Take 25 mg by mouth every morning. ) 90 tablet 2   No current facility-administered medications for this visit.     No Known Allergies    Review of Systems negative except from HPI and PMH  Physical Exam BP 120/80   Pulse 77   Ht 6\' 2"  (1.88 m)   Wt 251 lb 9.6 oz (114.1 kg)   SpO2 97%   BMI 32.30 kg/m   Well developed and nourished in no acute distress HENT normal Neck supple with JVP-flat Clear Regular rate  and rhythm, no murmurs or gallops Abd-soft with active BS No Clubbing cyanosis edema Skin-warm and dry A & Oriented  Grossly normal sensory and motor function  ECG Sinus @ 77 16/10/36   Assessment and  Plan  NICM  SubQ ICD The patient's device was interrogated.  The information was reviewed. No changes were made in the programming.     AFib  TIA   We will begin him on apixaban  Priorities for the treatment of atrial fibrillation (#1 anticoagulation/stroke-prevention, #2 adequate rate control, and #3 considerations of rhythm control) were reviewed with questions answered. Risks, benefits, and alternatives to various diagnostic and treatment strategies were reviewed. Diagnostic considerations to exclude contributing factors such  as thyroid disease and/ or obstructive sleep apnea, alcohol/hypertension/obesity were reviewed.  Treatment priorities with focus on assessment of thromboembolic risk (CHADS vs. CHADSvasc) and treatment options, adequate rate control (atrioventricular nodal blockade), and rhythm control considerations (based on symptom profile and antiarrhythmic candidacy) were reviewed including antiarrhythmic drugs with risk of proarrhythmia, atrial fibrillation ablation and permanent pacemaker implantation with atrioventricular node ablation (including discussion of risks and implications of permanent pacemaker and pacemaker dependent status).   Spoke with his wife   We spent more than 50% of our >25 min visit in face to face counseling regarding the above      Current medicines are reviewed at length with the patient today .  The patient does not  have concerns regarding medicines.

## 2018-07-02 NOTE — Telephone Encounter (Signed)
Spoke with patient. Patient requested later appointment time. Added-on to Dr. Odessa Fleming schedule for today at 2:15pm. Patient is appreciative of assistance.

## 2018-07-02 NOTE — Patient Instructions (Addendum)
Medication Instructions:  Your physician has recommended you make the following change in your medication:   1.  Start taking Eliquis 5 mg-  Take one tablet by mouth twice a day  Labwork: None ordered.  Testing/Procedures: None ordered.  Follow-Up: Your physician wants you to follow-up in: one year with [name] EP APP. You will receive a reminder letter in the mail two months in advance. If you don't receive a letter, please call our office to schedule the follow-up appointment.  Remote monitoring is used to monitor your ICD from home. This monitoring reduces the number of office visits required to check your device to one time per year. It allows [place] to keep an eye on the functioning of your device to ensure it is working properly. You are scheduled for a device check from home on 07/30/2018. You may send your transmission at any time that day. If you have a wireless device, the transmission will be sent automatically. After your physician reviews your transmission, you will receive a postcard with your next transmission date.  Any Other Special Instructions Will Be Listed Below (If Applicable).  If you need a refill on your cardiac medications before your next appointment, please call your pharmacy.

## 2018-07-05 LAB — CUP PACEART REMOTE DEVICE CHECK
Date Time Interrogation Session: 20191229184519
Implantable Lead Implant Date: 20180706
Implantable Lead Serial Number: 114025
Implantable Pulse Generator Implant Date: 20180706
MDC IDC LEAD LOCATION: 753862
Pulse Gen Serial Number: 228369

## 2018-07-12 ENCOUNTER — Emergency Department (HOSPITAL_BASED_OUTPATIENT_CLINIC_OR_DEPARTMENT_OTHER)
Admission: EM | Admit: 2018-07-12 | Discharge: 2018-07-12 | Disposition: A | Discharge status: Home / Self Care | Payer: BLUE CROSS/BLUE SHIELD | Attending: Emergency Medicine | Admitting: Emergency Medicine

## 2018-07-12 ENCOUNTER — Other Ambulatory Visit: Payer: Self-pay

## 2018-07-12 ENCOUNTER — Encounter (HOSPITAL_BASED_OUTPATIENT_CLINIC_OR_DEPARTMENT_OTHER): Payer: Self-pay | Admitting: *Deleted

## 2018-07-12 DIAGNOSIS — Z8673 Personal history of transient ischemic attack (TIA), and cerebral infarction without residual deficits: Secondary | ICD-10-CM | POA: Insufficient documentation

## 2018-07-12 DIAGNOSIS — I5022 Chronic systolic (congestive) heart failure: Secondary | ICD-10-CM | POA: Insufficient documentation

## 2018-07-12 DIAGNOSIS — I110 Hypertensive heart disease with heart failure: Secondary | ICD-10-CM | POA: Diagnosis not present

## 2018-07-12 DIAGNOSIS — Z79899 Other long term (current) drug therapy: Secondary | ICD-10-CM | POA: Diagnosis not present

## 2018-07-12 DIAGNOSIS — Z202 Contact with and (suspected) exposure to infections with a predominantly sexual mode of transmission: Secondary | ICD-10-CM | POA: Insufficient documentation

## 2018-07-12 DIAGNOSIS — Z7689 Persons encountering health services in other specified circumstances: Secondary | ICD-10-CM

## 2018-07-12 DIAGNOSIS — N4889 Other specified disorders of penis: Secondary | ICD-10-CM | POA: Diagnosis not present

## 2018-07-12 DIAGNOSIS — Z7901 Long term (current) use of anticoagulants: Secondary | ICD-10-CM | POA: Diagnosis not present

## 2018-07-12 LAB — URINALYSIS, ROUTINE W REFLEX MICROSCOPIC
Bilirubin Urine: NEGATIVE
GLUCOSE, UA: NEGATIVE mg/dL
HGB URINE DIPSTICK: NEGATIVE
Ketones, ur: NEGATIVE mg/dL
Leukocytes, UA: NEGATIVE
Nitrite: NEGATIVE
PH: 6.5 (ref 5.0–8.0)
Protein, ur: NEGATIVE mg/dL
SPECIFIC GRAVITY, URINE: 1.01 (ref 1.005–1.030)

## 2018-07-12 MED ORDER — AZITHROMYCIN 250 MG PO TABS
1000.0000 mg | ORAL_TABLET | Freq: Once | ORAL | Status: AC
  Administered 2018-07-12: 1000 mg via ORAL
  Filled 2018-07-12: qty 4

## 2018-07-12 MED ORDER — CEFTRIAXONE SODIUM 250 MG IJ SOLR
250.0000 mg | Freq: Once | INTRAMUSCULAR | Status: AC
  Administered 2018-07-12: 250 mg via INTRAMUSCULAR
  Filled 2018-07-12: qty 250

## 2018-07-12 MED ORDER — LIDOCAINE HCL (PF) 1 % IJ SOLN
INTRAMUSCULAR | Status: AC
  Administered 2018-07-12: 1.2 mL
  Filled 2018-07-12: qty 5

## 2018-07-12 NOTE — ED Triage Notes (Signed)
Pt reports pain and itching to penis x 2 days

## 2018-07-12 NOTE — ED Notes (Signed)
ED Provider at bedside. 

## 2018-07-12 NOTE — Discharge Instructions (Addendum)
You have been tested for chlamydia and gonorrhea.  These results will be available in approximately 3 days and you will be contacted by the hospital if the results are positive. Avoid sexual contact until you are aware of the results, and please inform all sexual partners if you test positive for any of these diseases.  Your urinalysis did not show any evidence of infection.  Please follow up with your primary care provider within 5-7 days for re-evaluation of your symptoms. If you do not have a primary care provider, information for a healthcare clinic has been provided for you to make arrangements for follow up care. Please return to the emergency department for any new or worsening symptoms.

## 2018-07-12 NOTE — ED Provider Notes (Signed)
[facility] EMERGENCY DEPARTMENT Provider Note   CSN: [id number] Arrival date & time: 07/12/18  1756     History   Chief Complaint Chief Complaint  Patient presents with  . Penis Pain    HPI Nathan Baird is a 38 y.o. male.  HPI   38 year old male with a history of CHF, CVA, PFO, who presents the emergency department today for evaluation of penile irritation and itching for the last 2 days.  States symptoms are intermittent.  No dysuria frequency or urgency.  No hematuria.  No lesions noted.  No erythema, swelling or tenderness to the penis or testicles.  No discharge noted.  No abdominal pain nausea vomiting diarrhea or.  No fevers.  States he recently had unprotected intercourse with a woman several days ago.  He is concerned for STDs.  He declines testing for HIV/RPR.  Past Medical History:  Diagnosis Date  . CHF (congestive heart failure) (HCC)   . Cough    a. PFTS 09/2012: restriction probable, further examinations recommended. Saw pulm 11/2012: placed on GERD rx and short course prednisone, planned to regroup.  . CVA (cerebral vascular accident) (HCC) 01/23/2013   tPA administered; MRI brain showed small acute R MCA infarct  . History of palpitations    2x/month for several years feels heart racing  . Nonischemic cardiomyopathy (HCC)    a. Identified 10/2012 by echo EF 20-25%. b. Echo 01/2013: EF 20-25%, TEE EF 15% with severe RV dysfunction as well.  [name] PFO (patent foramen ovale)    a. By TEE 01/2013.    Patient Active Problem List   Diagnosis Date Noted  . Chest pain 02/19/2018  . NICM (nonischemic cardiomyopathy) (HCC) 01/10/2017  . Trapezius muscle strain 11/24/2014  . Right shoulder pain 08/24/2014  . TIA (transient ischemic attack) 05/20/2014  . History of CVA (cerebrovascular accident)   . Essential hypertension   . HLD (hyperlipidemia)   . Encounter for long-term (current) use of high-risk medication 01/27/2013  . Patent foramen ovale with right to  left shunt 01/27/2013  . Stroke, acute, embolic (HCC) 01/25/2013  . Cough 10/20/2012  . Chronic systolic congestive heart failure (HCC) 10/20/2012  . Penile pain 05/19/2012  . ATTENTION DEFICIT, W/HYPERACTIVITY 09/04/2006    Past Surgical History:  Procedure Laterality Date  . NO PAST SURGERIES    . SUBQ ICD IMPLANT N/A 01/10/2017   Procedure: SubQ ICD Implant;  Surgeon: Duke Salvia, MD;  Location: [facility] INVASIVE CV LAB;  Service: Cardiovascular;  Laterality: N/A;  . TEE WITHOUT CARDIOVERSION N/A 01/26/2013   Procedure: TRANSESOPHAGEAL ECHOCARDIOGRAM (TEE);  Surgeon: [name], MD;  Location: [facility] ENDOSCOPY;  Service: Cardiovascular;  Laterality: N/A;        Home Medications    Prior to Admission medications   Medication Sig Start Date End Date Taking? Authorizing Provider  apixaban (ELIQUIS) 5 MG TABS tablet Take 1 tablet (5 mg total) by mouth 2 (two) times daily. 07/02/18  Yes Duke Salvia, MD  atorvastatin (LIPITOR) 10 MG tablet Take 1 tablet (10 mg total) by mouth daily at 6 PM. Patient taking differently: Take 10 mg by mouth every morning.  10/24/16  Yes Nahser, Deloris Ping, MD  carvedilol (COREG) 12.5 MG tablet Take 1 tablet (12.5 mg total) by mouth 2 (two) times daily with a meal. 02/21/18  Yes Regalado, Belkys A, MD  ENTRESTO 49-51 MG TAKE 1 TABLET BY MOUTH TWICE DAILY` Patient taking differently: Take 1 tablet by mouth 2 (two) times  daily.  12/22/17  Yes Nahser, Deloris PingPhilip J, MD  nitroGLYCERIN (NITROSTAT) 0.4 MG SL tablet Place 1 tablet (0.4 mg total) under the tongue every 5 (five) minutes x 3 doses as needed for chest pain. 02/21/18  Yes Regalado, Belkys A, MD  spironolactone (ALDACTONE) 25 MG tablet TAKE 1 TABLET BY MOUTH EVERY DAY Patient taking differently: Take 25 mg by mouth every morning.  12/22/17  Yes Nahser, Deloris PingPhilip J, MD    Family History Family History  Problem Relation Age of Onset  . Sarcoidosis Mother   . Heart Problems Mother        PPM implantation age 89      Social History Social History   Tobacco Use  . Smoking status: Never Smoker  . Smokeless tobacco: Never Used  Substance Use Topics  . Alcohol use: Not Currently    Comment: occ  . Drug use: No     Allergies   Patient has no known allergies.   Review of Systems Review of Systems  Constitutional: Negative for fever.  Respiratory: Negative for shortness of breath.   Cardiovascular: Negative for chest pain.  Gastrointestinal: Negative for abdominal pain, constipation, diarrhea, nausea and vomiting.  Genitourinary: Negative for dysuria, frequency, hematuria, penile pain, penile swelling, scrotal swelling, testicular pain and urgency.       Testicular irritation and itching  Musculoskeletal: Negative for back pain.  Skin: Negative for wound.  Neurological: Negative for headaches.     Physical Exam Updated Vital Signs BP 119/78 (BP Location: Left Arm)   Pulse 78   Temp 98.8 F (37.1 C) (Oral)   Resp 16   Ht 6\' 2"  (1.88 m)   Wt 113.4 kg   SpO2 98%   BMI 32.10 kg/m   Physical Exam Vitals signs and nursing note reviewed.  Constitutional:      Appearance: He is well-developed.  HENT:     Head: Normocephalic and atraumatic.  Eyes:     Conjunctiva/sclera: Conjunctivae normal.  Neck:     Musculoskeletal: Neck supple.  Cardiovascular:     Rate and Rhythm: Normal rate and regular rhythm.     Heart sounds: Normal heart sounds. No murmur.  Pulmonary:     Effort: Pulmonary effort is normal. No respiratory distress.     Breath sounds: Normal breath sounds.  Abdominal:     General: Bowel sounds are normal.     Palpations: Abdomen is soft.     Tenderness: There is no abdominal tenderness.  Genitourinary:    Comments: Chaperone present Skin:    General: Skin is warm and dry.  Neurological:     Mental Status: He is alert.      ED Treatments / Results  Labs (all labs ordered are listed, but only abnormal results are displayed) Labs Reviewed  URINALYSIS,  ROUTINE W REFLEX MICROSCOPIC  GC/CHLAMYDIA PROBE AMP ([place]) NOT [facility]    EKG None  Radiology No results found.  Procedures Procedures (including critical care time)  Medications Ordered in ED Medications  cefTRIAXone (ROCEPHIN) injection 250 mg (has no administration in time range)  azithromycin (ZITHROMAX) tablet 1,000 mg (has no administration in time range)     Initial Impression / Assessment and Plan / ED Course  I have reviewed the triage vital signs and the nursing notes.  Pertinent labs & imaging results that were available during my care of the patient were reviewed by me and considered in my medical decision making (see chart for details).   Final Clinical  Impressions(s) / ED Diagnoses   Final diagnoses:  Penile irritation  Encounter for assessment of sexually transmitted disease exposure   Patient is afebrile without abdominal tenderness, abdominal pain or painful bowel movements to indicate prostatitis.  No tenderness to palpation of the testes or epididymis to suggest orchitis or epididymitis.  STD cultures obtained including  gonorrhea and chlamydia. He declines HIV and RPR testing. UA without evidence of UTI. Patient to be discharged with instructions to follow up with PCP. Discussed importance of using protection when sexually active. Pt understands that they have GC/Chlamydia cultures pending and that they will need to inform all sexual partners if results return positive. Patient has been treated prophylactically with azithromycin and Rocephin.   ED Discharge Orders    None       Rayne Du 07/12/18 [id number]    Maia Plan, MD 07/13/18 1730

## 2018-07-12 NOTE — ED Notes (Signed)
Patient left at this time with all belongings. 

## 2018-07-13 LAB — GC/CHLAMYDIA PROBE AMP (~~LOC~~) NOT AT ARMC
Chlamydia: NEGATIVE
NEISSERIA GONORRHEA: NEGATIVE

## 2018-07-16 ENCOUNTER — Other Ambulatory Visit: Payer: Self-pay

## 2018-07-16 ENCOUNTER — Encounter (HOSPITAL_BASED_OUTPATIENT_CLINIC_OR_DEPARTMENT_OTHER): Payer: Self-pay | Admitting: *Deleted

## 2018-07-16 ENCOUNTER — Emergency Department (HOSPITAL_BASED_OUTPATIENT_CLINIC_OR_DEPARTMENT_OTHER)
Admission: EM | Admit: 2018-07-16 | Discharge: 2018-07-16 | Disposition: A | Discharge status: Home / Self Care | Payer: BLUE CROSS/BLUE SHIELD | Attending: Emergency Medicine | Admitting: Emergency Medicine

## 2018-07-16 DIAGNOSIS — R51 Headache: Secondary | ICD-10-CM | POA: Insufficient documentation

## 2018-07-16 DIAGNOSIS — Z7901 Long term (current) use of anticoagulants: Secondary | ICD-10-CM | POA: Insufficient documentation

## 2018-07-16 DIAGNOSIS — I110 Hypertensive heart disease with heart failure: Secondary | ICD-10-CM | POA: Diagnosis not present

## 2018-07-16 DIAGNOSIS — Z79899 Other long term (current) drug therapy: Secondary | ICD-10-CM | POA: Insufficient documentation

## 2018-07-16 DIAGNOSIS — I5022 Chronic systolic (congestive) heart failure: Secondary | ICD-10-CM | POA: Diagnosis not present

## 2018-07-16 DIAGNOSIS — Y92410 Unspecified street and highway as the place of occurrence of the external cause: Secondary | ICD-10-CM | POA: Insufficient documentation

## 2018-07-16 DIAGNOSIS — Y999 Unspecified external cause status: Secondary | ICD-10-CM | POA: Insufficient documentation

## 2018-07-16 DIAGNOSIS — Y939 Activity, unspecified: Secondary | ICD-10-CM | POA: Diagnosis not present

## 2018-07-16 MED ORDER — METHOCARBAMOL 500 MG PO TABS: 500.0000 mg | ORAL_TABLET | Freq: Two times a day (BID) | ORAL | 0 refills | Status: DC

## 2018-07-16 MED ORDER — IBUPROFEN 400 MG PO TABS
400.0000 mg | ORAL_TABLET | Freq: Once | ORAL | Status: AC
  Administered 2018-07-16: 400 mg via ORAL
  Filled 2018-07-16: qty 1

## 2018-07-16 NOTE — Discharge Instructions (Addendum)
Use tylenol, [phone] mg (1 or 2 extra strength tablets) for pain. Use the robaxin for muscle tightness/spasm.
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
## 2018-07-30 ENCOUNTER — Ambulatory Visit (INDEPENDENT_AMBULATORY_CARE_PROVIDER_SITE_OTHER): Payer: BLUE CROSS/BLUE SHIELD

## 2018-07-30 DIAGNOSIS — I428 Other cardiomyopathies: Secondary | ICD-10-CM | POA: Diagnosis not present

## 2018-07-31 ENCOUNTER — Encounter: Payer: Self-pay | Admitting: Cardiology

## 2018-07-31 NOTE — Progress Notes (Signed)
Remote ICD transmission.   

## 2018-08-02 LAB — CUP PACEART REMOTE DEVICE CHECK
Implantable Lead Model: 3401
Implantable Lead Serial Number: 114025
MDC IDC LEAD IMPLANT DT: 20180706
MDC IDC LEAD LOCATION: 753862
MDC IDC MSMT BATTERY REMAINING PERCENTAGE: 83 %
MDC IDC PG IMPLANT DT: 20180706
MDC IDC PG SERIAL: 228369
MDC IDC SESS DTM: 20200124051100

## 2018-08-04 ENCOUNTER — Encounter: Payer: Self-pay | Admitting: Internal Medicine

## 2018-10-06 IMAGING — CR DG CHEST 2V
2 series · 2 of 2 positions shown · non-contrast
Comparison: Chest x-ray 05/19/2014.

CLINICAL DATA: 36-year-old male status post ICD implantation.

EXAM:
CHEST  2 VIEW

[w chest pa]
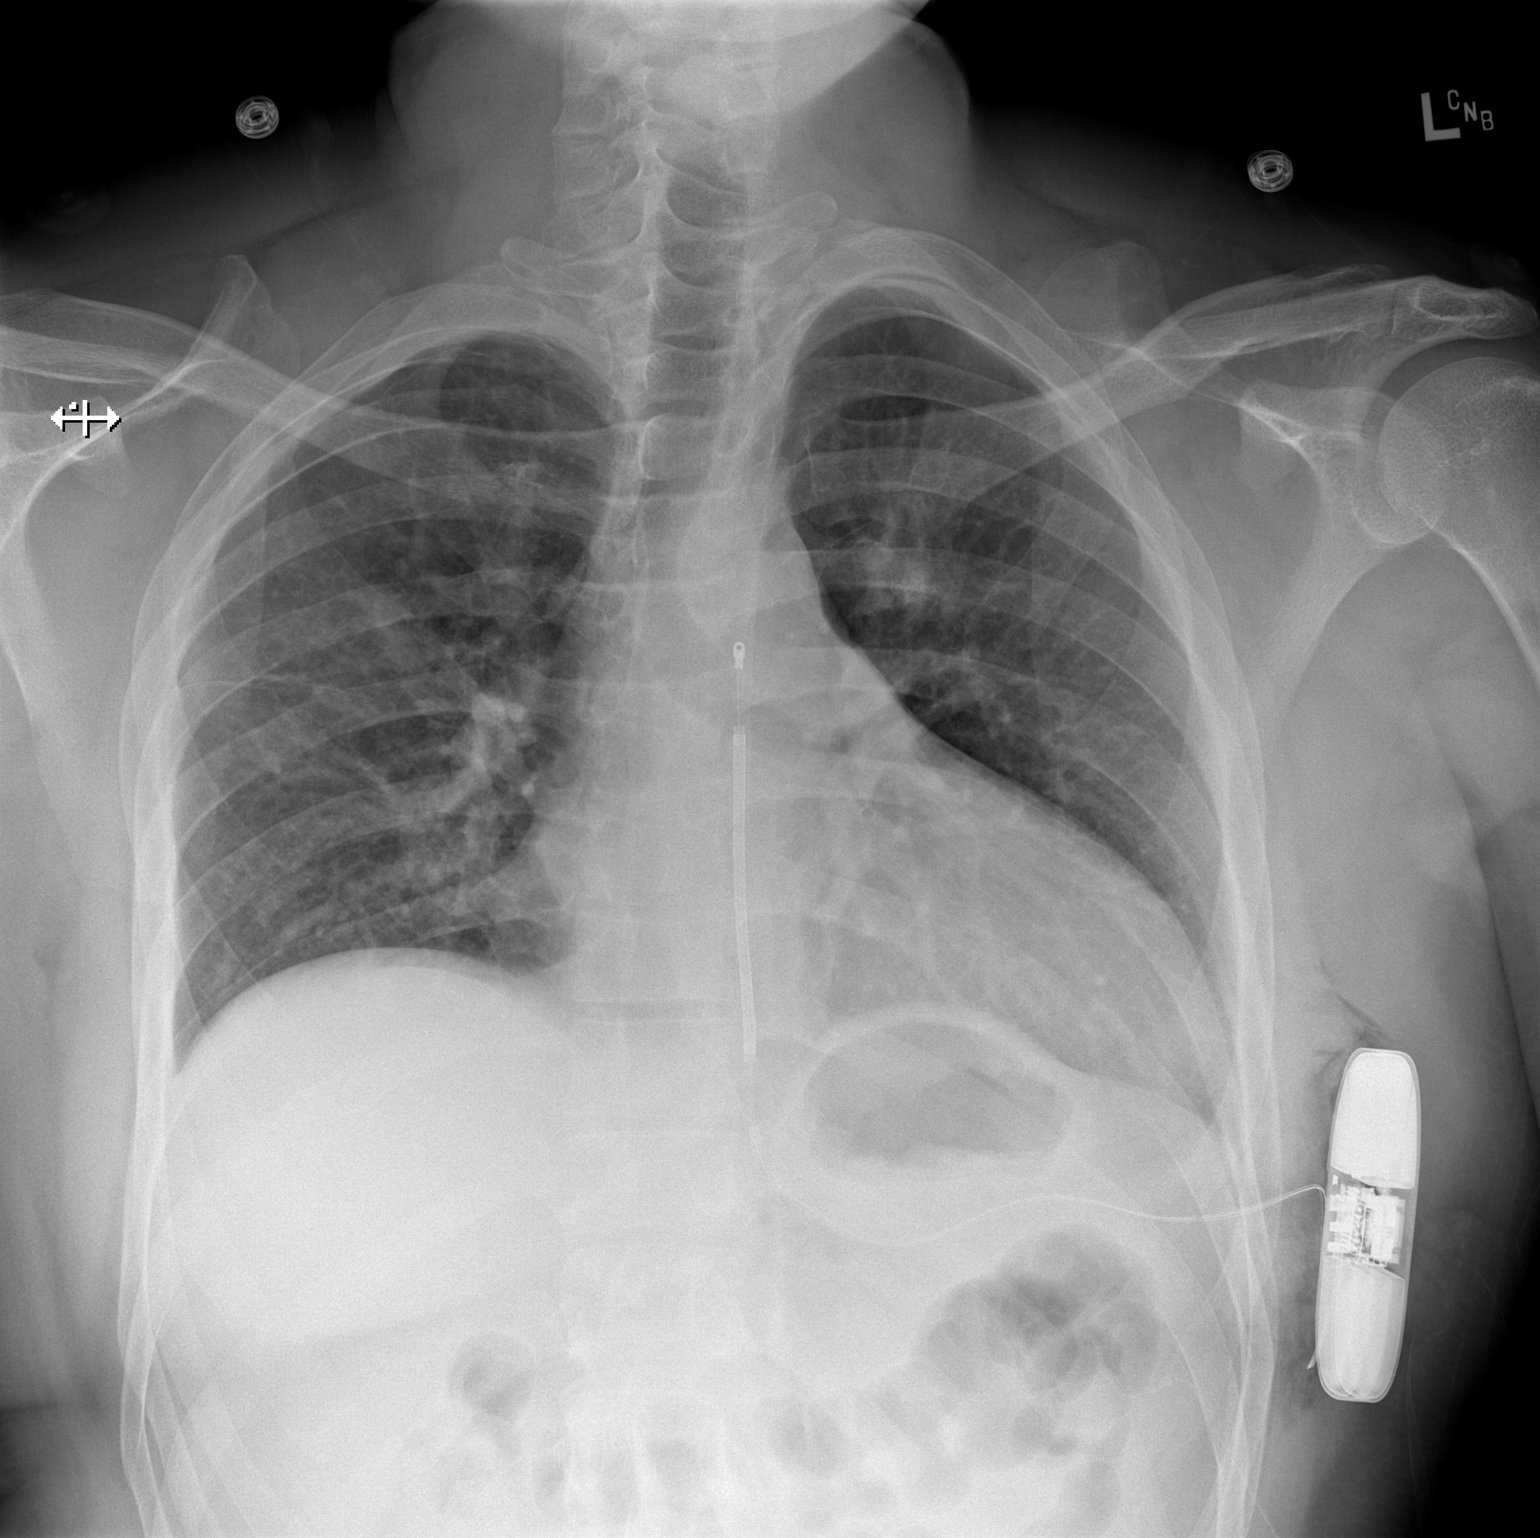

[w chest lat]
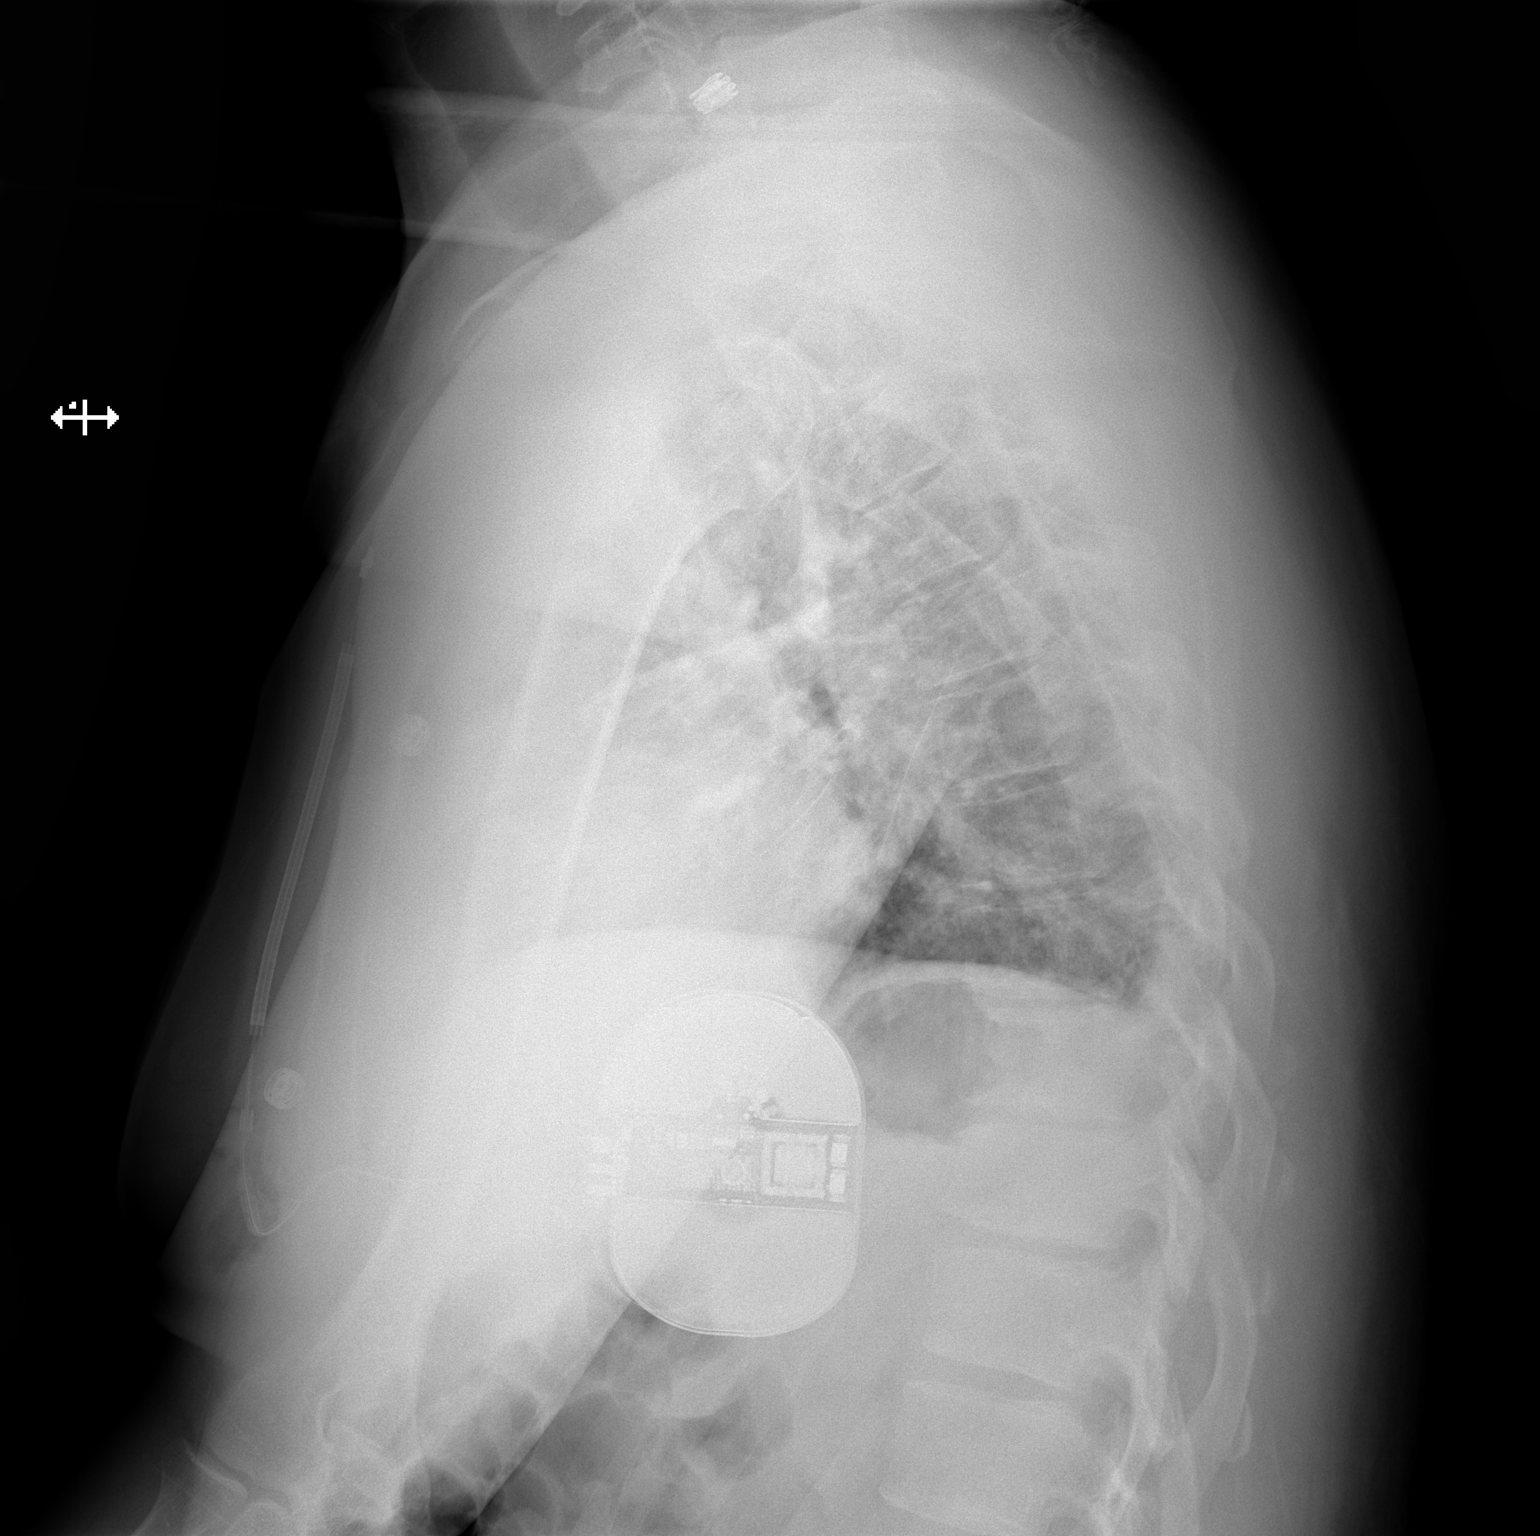

[2 of 2 positions shown; findings below may reference images not displayed]

FINDINGS: Lung volumes are low. No consolidative airspace disease. No pleural
effusions. No pneumothorax. No evidence pulmonary edema. Heart size
appears borderline enlarged. The patient is rotated to the left on
today's exam, resulting in distortion of the mediastinal contours
and reduced diagnostic sensitivity and specificity for mediastinal
pathology. New left-sided ICD with subcutaneous lead projecting over
the anterior chest wall near the midline. Small amount of
subcutaneous gas around the device related to recent placement.
IMPRESSION: 1. Status post left-sided ICD placement, as above, without acute
complicating features.

## 2018-10-29 ENCOUNTER — Ambulatory Visit (INDEPENDENT_AMBULATORY_CARE_PROVIDER_SITE_OTHER): Payer: BLUE CROSS/BLUE SHIELD | Admitting: *Deleted

## 2018-10-29 ENCOUNTER — Other Ambulatory Visit: Payer: Self-pay

## 2018-10-29 DIAGNOSIS — I428 Other cardiomyopathies: Secondary | ICD-10-CM

## 2018-10-30 LAB — CUP PACEART REMOTE DEVICE CHECK
Battery Remaining Percentage: 80 %
Date Time Interrogation Session: 20200424002100
Implantable Lead Implant Date: 20180706
Implantable Lead Location: 753862
Implantable Lead Model: 3401
Implantable Lead Serial Number: 114025
Implantable Pulse Generator Implant Date: 20180706
Pulse Gen Serial Number: 228369

## 2018-11-03 ENCOUNTER — Telehealth: Payer: Self-pay | Admitting: Internal Medicine

## 2018-11-03 NOTE — Telephone Encounter (Signed)
Pt works for [employer] improvement and some folks are getting time off with pay especially if at high risk. Pt is needing letter with medical condition that would put him at high risk with recovering.Will forward to Dr Graciela Husbands for review and recommendations ./cy

## 2018-11-03 NOTE — Telephone Encounter (Signed)
New Message    Pts wife is calling because the pt needs a note for work that describes his health conditions with covid  Please call

## 2018-11-04 NOTE — Telephone Encounter (Signed)
Called pt and advised him Dr Graciela Husbands is currently out of the office. I will discuss with him upon his return. Pt understands Dr Graciela Husbands is not able to write him out of work, however may be able to write a letter stating his medical condition may place him in a higher risk category. I will follow up with pt after discussed with Dr Graciela Husbands so pt may pick up his letter at the front desk.

## 2018-11-06 ENCOUNTER — Encounter: Payer: Self-pay | Admitting: Cardiology

## 2018-11-06 NOTE — Telephone Encounter (Signed)
Spoke with pt an explained to him that I was currently out of the office. I will contact someone in the office on Tues May 5th to have his letter printed out and signed. Pt may expect to be able to pick up his letter at the front desk later that week.  He has verbalized understanding and had no additional questions.

## 2018-11-06 NOTE — Progress Notes (Signed)
Remote ICD transmission.   

## 2018-11-10 NOTE — Telephone Encounter (Signed)
LVM for pt letting him know his letter is at the front desk and ready to be picked up.

## 2019-01-21 ENCOUNTER — Other Ambulatory Visit: Payer: Self-pay | Admitting: Cardiovascular Disease

## 2019-01-21 DIAGNOSIS — Z8673 Personal history of transient ischemic attack (TIA), and cerebral infarction without residual deficits: Secondary | ICD-10-CM

## 2019-01-21 DIAGNOSIS — I5022 Chronic systolic (congestive) heart failure: Secondary | ICD-10-CM

## 2019-01-22 ENCOUNTER — Other Ambulatory Visit: Payer: Self-pay | Admitting: Cardiovascular Disease

## 2019-01-22 DIAGNOSIS — I5022 Chronic systolic (congestive) heart failure: Secondary | ICD-10-CM

## 2019-01-22 DIAGNOSIS — Z8673 Personal history of transient ischemic attack (TIA), and cerebral infarction without residual deficits: Secondary | ICD-10-CM

## 2019-01-22 MED ORDER — CARVEDILOL 12.5 MG PO TABS: 12.5000 mg | ORAL_TABLET | Freq: Two times a day (BID) | ORAL | 0 refills | Status: DC

## 2019-01-22 NOTE — Addendum Note (Signed)
Addended by: Derl Barrow on: 01/22/2019 01:06 PM   Modules accepted: Orders

## 2019-01-28 ENCOUNTER — Encounter: Payer: BLUE CROSS/BLUE SHIELD | Admitting: *Deleted

## 2019-01-29 ENCOUNTER — Telehealth: Payer: Self-pay

## 2019-01-29 NOTE — Telephone Encounter (Signed)
Spoke with patient to remind of missed remote transmission 

## 2019-02-03 ENCOUNTER — Ambulatory Visit (INDEPENDENT_AMBULATORY_CARE_PROVIDER_SITE_OTHER): Payer: BLUE CROSS/BLUE SHIELD | Admitting: *Deleted

## 2019-02-03 DIAGNOSIS — I428 Other cardiomyopathies: Secondary | ICD-10-CM | POA: Diagnosis not present

## 2019-02-03 DIAGNOSIS — I5022 Chronic systolic (congestive) heart failure: Secondary | ICD-10-CM

## 2019-02-03 LAB — CUP PACEART REMOTE DEVICE CHECK
Battery Remaining Percentage: 77 %
Date Time Interrogation Session: 20200729040100
Implantable Lead Implant Date: 20180706
Implantable Lead Location: 753862
Implantable Lead Model: 3401
Implantable Lead Serial Number: 114025
Implantable Pulse Generator Implant Date: 20180706
Pulse Gen Serial Number: 228369

## 2019-02-12 NOTE — Progress Notes (Signed)
Remote ICD transmission.   

## 2019-05-06 ENCOUNTER — Ambulatory Visit: Payer: BLUE CROSS/BLUE SHIELD | Admitting: *Deleted

## 2019-05-11 ENCOUNTER — Ambulatory Visit (INDEPENDENT_AMBULATORY_CARE_PROVIDER_SITE_OTHER): Payer: BLUE CROSS/BLUE SHIELD | Admitting: *Deleted

## 2019-05-11 DIAGNOSIS — I5022 Chronic systolic (congestive) heart failure: Secondary | ICD-10-CM

## 2019-05-11 DIAGNOSIS — I428 Other cardiomyopathies: Secondary | ICD-10-CM | POA: Diagnosis not present

## 2019-05-11 LAB — CUP PACEART REMOTE DEVICE CHECK
Battery Remaining Percentage: 74 %
Date Time Interrogation Session: 20201103042800
Implantable Lead Implant Date: 20180706
Implantable Lead Location: 753862
Implantable Lead Model: 3401
Implantable Lead Serial Number: 114025
Implantable Pulse Generator Implant Date: 20180706
Pulse Gen Serial Number: 228369

## 2019-05-12 ENCOUNTER — Other Ambulatory Visit: Payer: Self-pay

## 2019-05-31 NOTE — Progress Notes (Signed)
Remote ICD transmission.   

## 2019-07-21 ENCOUNTER — Emergency Department (HOSPITAL_BASED_OUTPATIENT_CLINIC_OR_DEPARTMENT_OTHER): Payer: BLUE CROSS/BLUE SHIELD

## 2019-07-21 ENCOUNTER — Encounter (HOSPITAL_COMMUNITY): Payer: Self-pay | Admitting: Emergency Medicine

## 2019-07-21 ENCOUNTER — Emergency Department (HOSPITAL_COMMUNITY): Payer: BLUE CROSS/BLUE SHIELD

## 2019-07-21 ENCOUNTER — Encounter (HOSPITAL_BASED_OUTPATIENT_CLINIC_OR_DEPARTMENT_OTHER): Payer: Self-pay | Admitting: *Deleted

## 2019-07-21 ENCOUNTER — Emergency Department (HOSPITAL_COMMUNITY)
Admission: EM | Admit: 2019-07-21 | Discharge: 2019-07-21 | Discharge status: Against Medical Advice | Payer: BLUE CROSS/BLUE SHIELD | Attending: Emergency Medicine | Admitting: Emergency Medicine

## 2019-07-21 ENCOUNTER — Emergency Department (HOSPITAL_BASED_OUTPATIENT_CLINIC_OR_DEPARTMENT_OTHER)
Admission: EM | Admit: 2019-07-21 | Discharge: 2019-07-22 | Disposition: A | Discharge status: Home / Self Care | Payer: BLUE CROSS/BLUE SHIELD | Source: Home / Self Care | Attending: Emergency Medicine | Admitting: Emergency Medicine

## 2019-07-21 ENCOUNTER — Other Ambulatory Visit: Payer: Self-pay

## 2019-07-21 DIAGNOSIS — R55 Syncope and collapse: Secondary | ICD-10-CM | POA: Insufficient documentation

## 2019-07-21 DIAGNOSIS — Z5321 Procedure and treatment not carried out due to patient leaving prior to being seen by health care provider: Secondary | ICD-10-CM | POA: Insufficient documentation

## 2019-07-21 DIAGNOSIS — U071 COVID-19: Secondary | ICD-10-CM | POA: Insufficient documentation

## 2019-07-21 DIAGNOSIS — R519 Headache, unspecified: Secondary | ICD-10-CM

## 2019-07-21 DIAGNOSIS — Z7901 Long term (current) use of anticoagulants: Secondary | ICD-10-CM | POA: Insufficient documentation

## 2019-07-21 DIAGNOSIS — Z8673 Personal history of transient ischemic attack (TIA), and cerebral infarction without residual deficits: Secondary | ICD-10-CM | POA: Insufficient documentation

## 2019-07-21 DIAGNOSIS — R0602 Shortness of breath: Secondary | ICD-10-CM

## 2019-07-21 DIAGNOSIS — Z79899 Other long term (current) drug therapy: Secondary | ICD-10-CM | POA: Insufficient documentation

## 2019-07-21 DIAGNOSIS — I110 Hypertensive heart disease with heart failure: Secondary | ICD-10-CM | POA: Insufficient documentation

## 2019-07-21 DIAGNOSIS — I5022 Chronic systolic (congestive) heart failure: Secondary | ICD-10-CM | POA: Insufficient documentation

## 2019-07-21 DIAGNOSIS — E860 Dehydration: Secondary | ICD-10-CM | POA: Diagnosis present

## 2019-07-21 LAB — CBC WITH DIFFERENTIAL/PLATELET
Abs Immature Granulocytes: 0.02 10*3/uL (ref 0.00–0.07)
Basophils Absolute: 0 10*3/uL (ref 0.0–0.1)
Basophils Relative: 0 %
Eosinophils Absolute: 0 10*3/uL (ref 0.0–0.5)
Eosinophils Relative: 0 %
HCT: 47.4 % (ref 39.0–52.0)
Hemoglobin: 15.8 g/dL (ref 13.0–17.0)
Immature Granulocytes: 0 %
Lymphocytes Relative: 30 %
Lymphs Abs: 1.6 10*3/uL (ref 0.7–4.0)
MCH: 29.4 pg (ref 26.0–34.0)
MCHC: 33.3 g/dL (ref 30.0–36.0)
MCV: 88.1 fL (ref 80.0–100.0)
Monocytes Absolute: 0.6 10*3/uL (ref 0.1–1.0)
Monocytes Relative: 10 %
Neutro Abs: 3.3 10*3/uL (ref 1.7–7.7)
Neutrophils Relative %: 60 %
Platelets: 273 10*3/uL (ref 150–400)
RBC: 5.38 MIL/uL (ref 4.22–5.81)
RDW: 12.1 % (ref 11.5–15.5)
WBC: 5.5 10*3/uL (ref 4.0–10.5)
nRBC: 0 % (ref 0.0–0.2)

## 2019-07-21 LAB — COMPREHENSIVE METABOLIC PANEL
ALT: 29 U/L (ref 0–44)
AST: 29 U/L (ref 15–41)
Albumin: 4.1 g/dL (ref 3.5–5.0)
Alkaline Phosphatase: 47 U/L (ref 38–126)
Anion gap: 10 (ref 5–15)
BUN: 8 mg/dL (ref 6–20)
CO2: 26 mmol/L (ref 22–32)
Calcium: 9.2 mg/dL (ref 8.9–10.3)
Chloride: 101 mmol/L (ref 98–111)
Creatinine, Ser: 0.99 mg/dL (ref 0.61–1.24)
GFR calc Af Amer: >60 mL/min (ref >60)
GFR calc non Af Amer: >60 mL/min (ref >60)
Glucose, Bld: 105 mg/dL — ABNORMAL HIGH (ref 70–99)
Potassium: 3.7 mmol/L (ref 3.5–5.1)
Sodium: 137 mmol/L (ref 135–145)
Total Bilirubin: 0.6 mg/dL (ref 0.3–1.2)
Total Protein: 7.8 g/dL (ref 6.5–8.1)

## 2019-07-21 LAB — BASIC METABOLIC PANEL
Anion gap: 10 (ref 5–15)
BUN: 8 mg/dL (ref 6–20)
CO2: 25 mmol/L (ref 22–32)
Calcium: 9 mg/dL (ref 8.9–10.3)
Chloride: 103 mmol/L (ref 98–111)
Creatinine, Ser: 1.1 mg/dL (ref 0.61–1.24)
GFR calc Af Amer: >60 mL/min (ref >60)
GFR calc non Af Amer: >60 mL/min (ref >60)
Glucose, Bld: 110 mg/dL — ABNORMAL HIGH (ref 70–99)
Potassium: 3.9 mmol/L (ref 3.5–5.1)
Sodium: 138 mmol/L (ref 135–145)

## 2019-07-21 LAB — CBC
HCT: 46.9 % (ref 39.0–52.0)
Hemoglobin: 15.4 g/dL (ref 13.0–17.0)
MCH: 29.2 pg (ref 26.0–34.0)
MCHC: 32.8 g/dL (ref 30.0–36.0)
MCV: 89 fL (ref 80.0–100.0)
Platelets: 279 10*3/uL (ref 150–400)
RBC: 5.27 MIL/uL (ref 4.22–5.81)
RDW: 12.1 % (ref 11.5–15.5)
WBC: 5.2 10*3/uL (ref 4.0–10.5)
nRBC: 0 % (ref 0.0–0.2)

## 2019-07-21 LAB — D-DIMER, QUANTITATIVE: D-Dimer, Quant: 0.54 ug/mL-FEU — ABNORMAL HIGH (ref 0.00–0.50)

## 2019-07-21 LAB — SARS CORONAVIRUS 2 AG (30 MIN TAT): SARS Coronavirus 2 Ag: POSITIVE — AB

## 2019-07-21 LAB — TROPONIN I (HIGH SENSITIVITY): Troponin I (High Sensitivity): 5 ng/L (ref <18)

## 2019-07-21 MED ORDER — DIPHENHYDRAMINE HCL 50 MG/ML IJ SOLN
25.0000 mg | Freq: Once | INTRAMUSCULAR | Status: AC
  Administered 2019-07-21: 25 mg via INTRAVENOUS
  Filled 2019-07-21: qty 1

## 2019-07-21 MED ORDER — SODIUM CHLORIDE 0.9 % IV BOLUS
1000.0000 mL | Freq: Once | INTRAVENOUS | Status: AC
  Administered 2019-07-21: 23:00:00 1000 mL via INTRAVENOUS

## 2019-07-21 MED ORDER — METOCLOPRAMIDE HCL 5 MG/ML IJ SOLN
10.0000 mg | Freq: Once | INTRAMUSCULAR | Status: AC
  Administered 2019-07-21: 23:00:00 10 mg via INTRAVENOUS
  Filled 2019-07-21: qty 2

## 2019-07-21 MED ORDER — ACETAMINOPHEN 500 MG PO TABS
1000.0000 mg | ORAL_TABLET | Freq: Once | ORAL | Status: AC
  Administered 2019-07-21: 23:00:00 1000 mg via ORAL
  Filled 2019-07-21: qty 2

## 2019-07-21 MED ORDER — IOHEXOL 350 MG/ML SOLN
100.0000 mL | Freq: Once | INTRAVENOUS | Status: AC
  Administered 2019-07-21: 100 mL via INTRAVENOUS

## 2019-07-21 MED ORDER — DEXAMETHASONE SODIUM PHOSPHATE 10 MG/ML IJ SOLN
10.0000 mg | Freq: Once | INTRAMUSCULAR | Status: AC
  Administered 2019-07-22: 10 mg via INTRAVENOUS
  Filled 2019-07-21: qty 1

## 2019-07-21 NOTE — ED Provider Notes (Signed)
[facility] EMERGENCY DEPARTMENT Provider Note   CSN: [id number] Arrival date & time: 07/21/19  2030     History Chief Complaint  Patient presents with  . Shortness of Breath    Nathan Baird is a 39 y.o. male CHF, CVA, cardiomyopathy who presents for evaluation of 1 week of chills, diaphoresis, shortness of breath.  He also reports that he has had ongoing headaches for the last 2 weeks.  He states that headaches initially started out about 2 weeks ago.  He states that he was just sitting when they started.  No exertional component to the headache.  He states that the remained relatively constant throughout the 2 weeks.  They will get somewhat better but never go away completely.  He denies any preceding trauma, injury.  He does not have a history of headaches.  He states that occasionally, he will have some intermittent blurry vision but denies any vision changes right now.  Patient reports he also has had some shortness of breath which has been over the last week.  He reports that he traveled to [place] biplane a few days ago.  While he was there, about 2 days ago, he had a syncopal episode.  He went to urgent care where he got fluids.  He did fly back which was about an hour or so plane ride.  He reports that since being back, he has continued to have shortness of breath.  He feels like the shortness of breath is worse with exertion.  He does report some chest pain with deep inspiration but otherwise states he does not have any chest pain.  He states that he has also felt generalized fatigue, weakness.  He states he does not have any known COVID-19 exposure.  He denies any abdominal pain, nausea/vomiting, numbness/weakness of his arms or legs.  He states he has felt hot at home but has not measured any temperature. He denies any exogenous hormone use, recent immobilization, prior history of DVT/PE, recent surgery, leg swelling, or long travel.  The history is provided by the patient.         Past Medical History:  Diagnosis Date  . CHF (congestive heart failure) (HCC)   . Cough    a. PFTS 09/2012: restriction probable, further examinations recommended. Saw pulm 11/2012: placed on GERD rx and short course prednisone, planned to regroup.  . CVA (cerebral vascular accident) (HCC) 01/23/2013   tPA administered; MRI brain showed small acute R MCA infarct  . History of palpitations    2x/month for several years feels heart racing  . Nonischemic cardiomyopathy (HCC)    a. Identified 10/2012 by echo EF 20-25%. b. Echo 01/2013: EF 20-25%, TEE EF 15% with severe RV dysfunction as well.  [name] PFO (patent foramen ovale)    a. By TEE 01/2013.    Patient Active Problem List   Diagnosis Date Noted  . Chest pain 02/19/2018  . NICM (nonischemic cardiomyopathy) (HCC) 01/10/2017  . Trapezius muscle strain 11/24/2014  . Right shoulder pain 08/24/2014  . TIA (transient ischemic attack) 05/20/2014  . History of CVA (cerebrovascular accident)   . Essential hypertension   . HLD (hyperlipidemia)   . Encounter for long-term (current) use of high-risk medication 01/27/2013  . Patent foramen ovale with right to left shunt 01/27/2013  . Stroke, acute, embolic (HCC) 01/25/2013  . Cough 10/20/2012  . Chronic systolic congestive heart failure (HCC) 10/20/2012  . Penile pain 05/19/2012  . ATTENTION DEFICIT, W/HYPERACTIVITY 09/04/2006  Past Surgical History:  Procedure Laterality Date  . NO PAST SURGERIES    . SUBQ ICD IMPLANT N/A 01/10/2017   Procedure: SubQ ICD Implant;  Surgeon: Duke Salvia, MD;  Location: [facility] INVASIVE CV LAB;  Service: Cardiovascular;  Laterality: N/A;  . TEE WITHOUT CARDIOVERSION N/A 01/26/2013   Procedure: TRANSESOPHAGEAL ECHOCARDIOGRAM (TEE);  Surgeon: [name], MD;  Location: [facility] ENDOSCOPY;  Service: Cardiovascular;  Laterality: N/A;       Family History  Problem Relation Age of Onset  . Sarcoidosis Mother   . Heart Problems Mother        PPM  implantation age 52    Social History   Tobacco Use  . Smoking status: Never Smoker  . Smokeless tobacco: Never Used  Substance Use Topics  . Alcohol use: Not Currently    Comment: occ  . Drug use: No    Home Medications Prior to Admission medications   Medication Sig Start Date End Date Taking? Authorizing Provider  apixaban (ELIQUIS) 5 MG TABS tablet Take 1 tablet (5 mg total) by mouth 2 (two) times daily. 07/02/18   Duke Salvia, MD  atorvastatin (LIPITOR) 10 MG tablet Take 1 tablet (10 mg total) by mouth daily at 6 PM. Patient taking differently: Take 10 mg by mouth every morning.  10/24/16   Nahser, Deloris Ping, MD  carvedilol (COREG) 12.5 MG tablet Take 1 tablet (12.5 mg total) by mouth 2 (two) times daily with a meal. Please make overdue appt with Dr. Elease Hashimoto before anymore refills. 1st attempt 01/22/19   Nahser, Deloris Ping, MD  methocarbamol (ROBAXIN) 500 MG tablet Take 1 tablet (500 mg total) by mouth 2 (two) times daily. 07/16/18   Felicie Morn, NP  nitroGLYCERIN (NITROSTAT) 0.4 MG SL tablet Place 1 tablet (0.4 mg total) under the tongue every 5 (five) minutes x 3 doses as needed for chest pain. 02/21/18   Regalado, Belkys A, MD  sacubitril-valsartan (ENTRESTO) 49-51 MG Take 1 tablet by mouth 2 (two) times daily. Please make overdue appt with Dr. Elease Hashimoto before anymore refills. 1st attempt 01/22/19   Nahser, Deloris Ping, MD  spironolactone (ALDACTONE) 25 MG tablet Take 1 tablet (25 mg total) by mouth daily. Please make overdue appt with [name] before anymore refills. 1st attempt 01/22/19   Nahser, Deloris Ping, MD    Allergies    Patient has no known allergies.  Review of Systems   Review of Systems  Constitutional: Positive for fatigue. Negative for fever.  Respiratory: Positive for cough and shortness of breath.   Cardiovascular: Negative for chest pain.  Gastrointestinal: Negative for abdominal pain, nausea and vomiting.  Genitourinary: Negative for dysuria and hematuria.    Neurological: Positive for numbness and headaches. Negative for weakness.  All other systems reviewed and are negative.   Physical Exam Updated Vital Signs BP 121/87 (BP Location: Right Arm)   Pulse 85   Temp (!) 102.3 F (39.1 C) (Oral)   Resp (!) 28   Ht 6\' 2"  (1.88 m)   Wt 120.2 kg   SpO2 95%   BMI 34.02 kg/m   Physical Exam Vitals and nursing note reviewed.  Constitutional:      Appearance: Normal appearance. He is well-developed.  HENT:     Head: Normocephalic and atraumatic.  Eyes:     General: Lids are normal.     Conjunctiva/sclera: Conjunctivae normal.     Pupils: Pupils are equal, round, and reactive to light.     Comments: PERRL.  EOMs intact. No nystagmus. No neglect.   Neck:     Comments: Neck is supple and without rigidity. Cardiovascular:     Rate and Rhythm: Normal rate and regular rhythm.     Pulses: Normal pulses.     Heart sounds: Normal heart sounds. No murmur. No friction rub. No gallop.   Pulmonary:     Effort: Pulmonary effort is normal. Tachypnea present.     Breath sounds: Normal breath sounds.     Comments: Slight tachypnea.  No evidence of decreased lung sounds, rales, wheezing.  Lungs clear to auscultation. Abdominal:     Palpations: Abdomen is soft. Abdomen is not rigid.     Tenderness: There is no abdominal tenderness. There is no guarding.  Musculoskeletal:        General: Normal range of motion.     Cervical back: Full passive range of motion without pain.  Skin:    General: Skin is warm and dry.     Capillary Refill: Capillary refill takes less than 2 seconds.  Neurological:     Mental Status: He is alert and oriented to person, place, and time.     Comments: Cranial nerves III-XII intact Follows commands, Moves all extremities  5/5 strength to BUE and BLE  Sensation intact throughout all major nerve distributions No slurred speech. No facial droop.   Psychiatric:        Speech: Speech normal.     ED Results / Procedures /  Treatments   Labs (all labs ordered are listed, but only abnormal results are displayed) Labs Reviewed  SARS CORONAVIRUS 2 AG (30 MIN TAT) - Abnormal; Notable for the following components:      Result Value   SARS Coronavirus 2 Ag POSITIVE (*)    All other components within normal limits  COMPREHENSIVE METABOLIC PANEL - Abnormal; Notable for the following components:   Glucose, Bld 105 (*)    All other components within normal limits  D-DIMER, QUANTITATIVE (NOT AT [facility]) - Abnormal; Notable for the following components:   D-Dimer, Quant 0.54 (*)    All other components within normal limits  CBC WITH DIFFERENTIAL/PLATELET  TROPONIN I (HIGH SENSITIVITY)    EKG EKG Interpretation  Date/Time:  Wednesday July 21 2019 20:51:26 EST Ventricular Rate:  83 PR Interval:    QRS Duration: 89 QT Interval:  346 QTC Calculation: 407 R Axis:   40 Text Interpretation: Sinus rhythm Multiple ventricular premature complexes Borderline abnrm T, anterolateral leads No Stemi Confirmed by Alvester Chou [phone]) on 07/21/2019 8:59:53 PM   Radiology DG Chest 2 View  Result Date: 07/21/2019 CLINICAL DATA:  39 year old male with shortness of breath. EXAM: CHEST - 2 VIEW COMPARISON:  Chest radiograph dated 02/19/2018 FINDINGS: There is shallow inspiration. Bilateral mid to lower lung field reticulonodular densities may represent edema or pneumonia. Clinical correlation is recommended. No large pleural effusion or pneumothorax. Mild eventration of the right hemidiaphragm. There is stable cardiomegaly. A defibrillator is again seen. No acute osseous pathology. IMPRESSION: 1. Findings may represent edema or multifocal pneumonia. Clinical correlation is recommended. 2. Stable mild cardiomegaly. Electronically Signed   By: Elgie Collard M.D.   On: 07/21/2019 18:28   CT Head Wo Contrast  Result Date: 07/21/2019 CLINICAL DATA:  Syncopal episode with persistent headache. Since reported event EXAM: CT HEAD  WITHOUT CONTRAST TECHNIQUE: Contiguous axial images were obtained from the base of the skull through the vertex without intravenous contrast. COMPARISON:  CT head 05/19/2014 FINDINGS: Brain: No evidence of  acute infarction, hemorrhage, hydrocephalus, extra-axial collection or mass lesion/mass effect. Vascular: No hyperdense vessel or unexpected calcification. Skull: No calvarial fracture or suspicious osseous lesion. No scalp swelling or hematoma. Sinuses/Orbits: Paranasal sinuses and mastoid air cells are predominantly clear. Included orbital structures are unremarkable. Other: Extensive debris in the right external auditory canals. Small amount of debris in the left external auditory canal. IMPRESSION: No acute intracranial abnormality. Debris in the external auditory canals. Correlate with visual inspection to exclude cerumen impaction. Electronically Signed   By: Lovena Le M.D.   On: 07/21/2019 23:18   CT Angio Chest PE W and/or Wo Contrast  Result Date: 07/21/2019 CLINICAL DATA:  Shortness of breath, syncope, persistent headache EXAM: CT ANGIOGRAPHY CHEST WITH CONTRAST TECHNIQUE: Multidetector CT imaging of the chest was performed using the standard protocol during bolus administration of intravenous contrast. Multiplanar CT image reconstructions and MIPs were obtained to evaluate the vascular anatomy. CONTRAST:  181mL OMNIPAQUE IOHEXOL 350 MG/ML SOLN COMPARISON:  Same day chest radiograph, coronary CT February 21, 2018 FINDINGS: Cardiovascular: Satisfactory opacification the pulmonary arteries to the segmental level. No pulmonary artery filling defects are identified. Dilatation of the central pulmonary arteries is similar to comparison CT measuring up to 3.7 cm in diameter at the level of the main pulmonary artery. Normal heart size. No pericardial effusion. Normal caliber thoracic aorta. Shared origin of the brachiocephalic and left common carotid arteries. Proximal great vessels are unremarkable.  Mediastinum/Nodes: Numerous subcentimeter low-attenuation mediastinal and hilar lymph nodes are present several borderline enlarged nodes are also visualized. For example, 11 mm right hilar node (13/117), a 14 mm subcarinal node (13/122) and a 12 mm left hilar node (13/139). Small hiatal hernia. Scattered secretions within the trachea. Thyroid gland and thoracic inlet are unremarkable. Lungs/Pleura: Diffuse extensive multifocal mixed reticulonodular airspace opacities throughout the lungs with some basilar and peripheral predominance as well as diffuse airways thickening and scattered secretions. Some interspersed areas of bandlike opacity likely reflect subsegmental atelectatic change. No pneumothorax. No visible pleural fluid. Upper Abdomen: No acute abnormalities present in the visualized portions of the upper abdomen. Musculoskeletal: Multilevel degenerative changes are present in the imaged portions of the spine. No acute osseous abnormality or suspicious osseous lesion. Subcutaneous ICD device noted with the tip overlying the sternum. Appearance and positioning is unchanged from comparison. Review of the MIP images confirms the above findings. IMPRESSION: 1. No evidence for acute pulmonary embolus. 2. Diffuse extensive multifocal mixed reticulonodular airspace opacities throughout the lungs with some basilar and peripheral predominance as well as diffuse airways thickening and scattered secretions. Findings are favored to represent acute multifocal pneumonia including potential atypical viral etiologies such as COVID-19. 3. Borderline enlarged mediastinal and hilar lymph nodes, likely reactive. 4. Small hiatal hernia. Electronically Signed   By: Lovena Le M.D.   On: 07/21/2019 23:30    Procedures Procedures (including critical care time)  Medications Ordered in ED Medications  dexamethasone (DECADRON) injection 10 mg (has no administration in time range)  sodium chloride 0.9 % bolus 1,000 mL (1,000  mLs Intravenous New Bag/Given 07/21/19 2257)  metoCLOPramide (REGLAN) injection 10 mg (10 mg Intravenous Given 07/21/19 2300)  diphenhydrAMINE (BENADRYL) injection 25 mg (25 mg Intravenous Given 07/21/19 2257)  acetaminophen (TYLENOL) tablet 1,000 mg (1,000 mg Oral Given 07/21/19 2255)  iohexol (OMNIPAQUE) 350 MG/ML injection 100 mL (100 mLs Intravenous Contrast Given 07/21/19 2307)    ED Course  I have reviewed the triage vital signs and the nursing notes.  Pertinent labs & imaging  results that were available during my care of the patient were reviewed by me and considered in my medical decision making (see chart for details).    MDM Rules/Calculators/A&P                      39 year old male who presents for evaluation of 1 week of shortness of breath, fatigue, generalized weakness.  Also reports he had a syncopal episode 2 days ago.  Did recently travel to [place].  No known COVID-19 exposure.  He also reports that over the last 2 to 3 weeks, he has had this intermittent headache.  No history of headaches.  On initial ED arrival, he is afebrile, nontoxic appearing.  He does have slight tachypnea but vitals otherwise stable.  No evidence of hypoxia.  Lungs clear to auscultation on exam.  He does have tachypnea noted.  He states that he does have some pleuritic chest pain but no other chest pain.  Low suspicion for ACS etiology but given his history, will plan EKG, lab work.  Additionally, consider infectious etiology.  Also concern for PE given pleuritic chest pain as well as shortness of breath.  He is low risk and does not have any risk factors for PE.  Will plan to check D-dimer.  Additionally, also consider COVID-19.  Additionally, at this time, his history/physical exam is not concerning for CVA, acute intracranial hemorrhage. No meningismal signs. He does report that he does not have any history of headaches therefore we will plan for imaging to ensure that there is no acute intracranial  abnormality that would be concerning for symptoms.  While here in the emergency department, patient did spike a fever of 102.3.  Patient given Tylenol.  D-dimer slightly elevated at 0.54.  CMP is unremarkable.  CBC without any significant leukocytosis or anemia.  Troponin negative.  Given elevated D-dimer, will plan for CTA of chest.  CT head negative for any intracranial abnormality. Patient is COVID positive which I suspect is likely causing his symptoms. CTA is negative for any acute PE but he does have diffuse extensive multifocal pneumonia likely related to Covid. He has some enlarged lymph nodes likely secondary to reactive lymph nodes.  Discussed results with patient. He reports improvement in headache since being here in the emergency department. His vitals are stable. He was able to ambulate in the ED with oxygen levels greater than 95% on room air. No evidence of hypoxia. At this time, he does not meet any criteria for admission. At this time, patient exhibits no emergent life-threatening condition that require further evaluation in ED or admission. Patient had ample opportunity for questions and discussion. All patient's questions were answered with full understanding. Strict return precautions discussed. Patient expresses understanding and agreement to plan.   MAZIAR SALEY was evaluated in Emergency Department on 07/21/2019 for the symptoms described in the history of present illness. He was evaluated in the context of the global COVID-19 pandemic, which necessitated consideration that the patient might be at risk for infection with the SARS-CoV-2 virus that causes COVID-19. Institutional protocols and algorithms that pertain to the evaluation of patients at risk for COVID-19 are in a state of rapid change based on information released by regulatory bodies including the CDC and federal and state organizations. These policies and algorithms were followed during the patient's care in the  ED.  Portions of this note were generated with Scientist, clinical (histocompatibility and immunogenetics). Dictation errors may occur despite best attempts at proofreading.  Final Clinical Impression(s) / ED Diagnoses Final diagnoses:  COVID-19  SOB (shortness of breath)  Acute nonintractable headache, unspecified headache type    Rx / DC Orders ED Discharge Orders    None       Rosana Hoes 07/21/19 2342    Terald Sleeper, MD 07/22/19 1123

## 2019-07-21 NOTE — ED Triage Notes (Signed)
Pt c/o SOB , chest pain x 2 days

## 2019-07-21 NOTE — ED Notes (Signed)
Pt states that he has been having chills and sweats, to clarity his syncopal episode was when he flew back from [place] yesterday.

## 2019-07-21 NOTE — ED Triage Notes (Signed)
Pt reports being dehydrated and passing out earlier this week. States his tongue is dry. Endorses right sided HA, SOB. Hx of CHF

## 2019-07-21 NOTE — ED Notes (Signed)
Attempt IV placement in LAC without success

## 2019-07-21 NOTE — ED Notes (Addendum)
PT approached this tech and stated " I am about to leave and go to the one in [place]".

## 2019-07-21 NOTE — Discharge Instructions (Signed)
As we discussed, your COVID-19 test was positive which is likely causing your symptoms. You need to quarantine for 2 weeks.  It is very important that you get plenty of rest and that you stay hydrated.  You can take Tylenol or Ibuprofen as directed for pain. You can alternate Tylenol and Ibuprofen every 4 hours. If you take Tylenol at 1pm, then you can take Ibuprofen at 5pm. Then you can take Tylenol again at 9pm.   Return to the emergency department for any difficulty breathing, chest pain, difficulty eating or drinking, vomiting or any other worsening or concerning symptoms.

## 2019-07-21 NOTE — ED Notes (Signed)
Pt states that he feels that he is dehydrated, has a migraine (unrelieved by OTC medications), sob and CP.  Pt states that he was in [place] and had a syncopal episode which resulted in him getting seen at [facility] and having blood work done and received fluids. Pt flew back from [place] today.  Pt continues to have HA on right side of his head which began 2 weeks ago, worse this week.  Pt would like a covid test.

## 2019-08-10 ENCOUNTER — Ambulatory Visit (INDEPENDENT_AMBULATORY_CARE_PROVIDER_SITE_OTHER): Payer: BLUE CROSS/BLUE SHIELD | Admitting: *Deleted

## 2019-08-10 ENCOUNTER — Telehealth: Payer: Self-pay | Admitting: Internal Medicine

## 2019-08-10 DIAGNOSIS — I428 Other cardiomyopathies: Secondary | ICD-10-CM

## 2019-08-10 NOTE — Telephone Encounter (Signed)
Attempted phone call to pt's wife re: letter per pt request.  No answer.  No voicemail left.

## 2019-08-10 NOTE — Telephone Encounter (Signed)
Patient states he needs a letter from 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 He states he needs this today so he can go to work tomorrow.

## 2019-08-10 NOTE — Telephone Encounter (Signed)
Follow Up  Patient is calling in to follow up on the note for his return back to work. States that his wife is outside now waiting on the note if we are able to give the note. Patient's wife, Marjean Donna, can be reached at [phone].

## 2019-08-10 NOTE — Telephone Encounter (Signed)
Left message for pt to return call to RN at [phone]. 

## 2019-08-10 NOTE — Telephone Encounter (Signed)
Follow up:    Patient calling stating that a nurse called him, but need the call to be to his wife he can not answers the phone patient phone broke. Wife is waiting down stairs. 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

## 2019-08-11 ENCOUNTER — Other Ambulatory Visit: Payer: Self-pay | Admitting: Internal Medicine

## 2019-08-11 DIAGNOSIS — I5022 Chronic systolic (congestive) heart failure: Secondary | ICD-10-CM

## 2019-08-11 DIAGNOSIS — Z8673 Personal history of transient ischemic attack (TIA), and cerebral infarction without residual deficits: Secondary | ICD-10-CM

## 2019-08-11 LAB — CUP PACEART REMOTE DEVICE CHECK
Battery Remaining Percentage: 72 %
Date Time Interrogation Session: 20210203001400
Implantable Lead Implant Date: 20180705200000
Implantable Lead Location: 753862
Implantable Lead Model: 3401
Implantable Lead Serial Number: 114025
Implantable Pulse Generator Implant Date: 20180705200000
Pulse Gen Serial Number: 228369

## 2019-08-11 MED ORDER — ENTRESTO 49-51 MG PO TABS: 1.0000 | ORAL_TABLET | Freq: Two times a day (BID) | ORAL | 1 refills | Status: DC

## 2019-08-11 NOTE — Telephone Encounter (Signed)
New Message   *STAT* If patient is at the pharmacy, call can be transferred to refill team.   1. Which medications need to be refilled? (please list name of each medication and dose if known) sacubitril-valsartan (ENTRESTO) 49-51 MG  2. Which pharmacy/location (including street and city if local pharmacy) is medication to be sent to? [facility] [phone] - [place], [place] - [address]  3. Do they need a 30 day or 90 day supply? 30 day   Patient is scheduled for an appointment with Dr. Elease Hashimoto on 09/14/19. Please send refill to cover patient until then. Patient is completely out of medication and needs today.

## 2019-08-11 NOTE — Telephone Encounter (Signed)
Patient's wife, Marjean Donna, is returning a call in regards to DOT letter.

## 2019-08-11 NOTE — Telephone Encounter (Signed)
Spoke with [facility],  clearance is dependent on EF and absence of an ICD-  last EF 2019 so can consider repeating it.  My understanding from [facility] is that either is disaqualifying, so if EF is low, then the on/off of an ICD matters not

## 2019-08-11 NOTE — Telephone Encounter (Signed)
Left voice mail message to return call to RN to [phone] re: DOT letter.

## 2019-08-11 NOTE — Telephone Encounter (Signed)
Per Wynona Canes in scheduling, Dorann Lodge the PA at [facility] would like [name] to give him a call at his cell phone [phone] regarding pt..  Will forward information to Dr Graciela Husbands to contact PA

## 2019-08-11 NOTE — Telephone Encounter (Signed)
Spoke with pt and advised per Dr Graciela Husbands we can provide pt with a letter with his most recent EF from 02/2018 for his DOT physical. 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  Advised letter has been placed downstairs for pick up.  Pt verbalizes understanding and agrees with plan.

## 2019-08-23 ENCOUNTER — Other Ambulatory Visit: Payer: Self-pay

## 2019-08-23 ENCOUNTER — Emergency Department (HOSPITAL_BASED_OUTPATIENT_CLINIC_OR_DEPARTMENT_OTHER)
Admission: EM | Admit: 2019-08-23 | Discharge: 2019-08-23 | Disposition: A | Discharge status: Home / Self Care | Payer: BLUE CROSS/BLUE SHIELD | Attending: Emergency Medicine | Admitting: Emergency Medicine

## 2019-08-23 ENCOUNTER — Encounter (HOSPITAL_BASED_OUTPATIENT_CLINIC_OR_DEPARTMENT_OTHER): Payer: Self-pay

## 2019-08-23 DIAGNOSIS — I110 Hypertensive heart disease with heart failure: Secondary | ICD-10-CM | POA: Insufficient documentation

## 2019-08-23 DIAGNOSIS — Z8673 Personal history of transient ischemic attack (TIA), and cerebral infarction without residual deficits: Secondary | ICD-10-CM | POA: Diagnosis not present

## 2019-08-23 DIAGNOSIS — Z113 Encounter for screening for infections with a predominantly sexual mode of transmission: Secondary | ICD-10-CM | POA: Insufficient documentation

## 2019-08-23 DIAGNOSIS — Z7901 Long term (current) use of anticoagulants: Secondary | ICD-10-CM | POA: Diagnosis not present

## 2019-08-23 DIAGNOSIS — I5022 Chronic systolic (congestive) heart failure: Secondary | ICD-10-CM | POA: Insufficient documentation

## 2019-08-23 DIAGNOSIS — N4889 Other specified disorders of penis: Secondary | ICD-10-CM | POA: Diagnosis present

## 2019-08-23 DIAGNOSIS — R300 Dysuria: Secondary | ICD-10-CM | POA: Insufficient documentation

## 2019-08-23 DIAGNOSIS — Z79899 Other long term (current) drug therapy: Secondary | ICD-10-CM | POA: Insufficient documentation

## 2019-08-23 DIAGNOSIS — Z711 Person with feared health complaint in whom no diagnosis is made: Secondary | ICD-10-CM

## 2019-08-23 MED ORDER — CEFTRIAXONE SODIUM 500 MG IJ SOLR
INTRAMUSCULAR | Status: AC
  Filled 2019-08-23: qty 500

## 2019-08-23 MED ORDER — DOXYCYCLINE HYCLATE 100 MG PO CAPS: 100.0000 mg | ORAL_CAPSULE | Freq: Two times a day (BID) | ORAL | 0 refills | Status: DC

## 2019-08-23 MED ORDER — CEFTRIAXONE SODIUM 500 MG IJ SOLR
500.0000 mg | Freq: Once | INTRAMUSCULAR | Status: AC
  Administered 2019-08-23: 21:00:00 500 mg via INTRAMUSCULAR
  Filled 2019-08-23: qty 500

## 2019-08-23 NOTE — ED Provider Notes (Signed)
[facility] EMERGENCY DEPARTMENT Provider Note   CSN: [id number] Arrival date & time: 08/23/19  1928     History Chief Complaint  Patient presents with  . Penis Pain    Nathan Baird is a 39 y.o. male with medical history significant for CVA, CHF, chlamydia who presents for evaluation of STD screen.  Patient states he has had some burning with urination consistent with his prior episode of chlamydia.  He denies any penile discharge, pain with bowel movements.  Denies any rashes or lesions.  No testicular pain, swelling.  Denies any hematuria.  Sexually active with male partners only.  Denies additional aggravating or alleviating factors.  The history is provided by the patient. No language interpreter was used.  Penis Pain This is a new problem. The current episode started yesterday. The problem occurs constantly. The problem has not changed since onset.Pertinent negatives include no chest pain, no abdominal pain, no headaches and no shortness of breath. Nothing aggravates the symptoms. Nothing relieves the symptoms. He has tried nothing for the symptoms. The treatment provided no relief.  Exposure to STD Pertinent negatives include no chest pain, no abdominal pain, no headaches and no shortness of breath.       Past Medical History:  Diagnosis Date  . CHF (congestive heart failure) (HCC)   . Cough    a. PFTS 09/2012: restriction probable, further examinations recommended. Saw pulm 11/2012: placed on GERD rx and short course prednisone, planned to regroup.  . CVA (cerebral vascular accident) (HCC) 01/23/2013   tPA administered; MRI brain showed small acute R MCA infarct  . History of palpitations    2x/month for several years feels heart racing  . Nonischemic cardiomyopathy (HCC)    a. Identified 10/2012 by echo EF 20-25%. b. Echo 01/2013: EF 20-25%, TEE EF 15% with severe RV dysfunction as well.  [name] PFO (patent foramen ovale)    a. By TEE 01/2013.    Patient Active  Problem List   Diagnosis Date Noted  . Chest pain 02/19/2018  . NICM (nonischemic cardiomyopathy) (HCC) 01/10/2017  . Trapezius muscle strain 11/24/2014  . Right shoulder pain 08/24/2014  . TIA (transient ischemic attack) 05/20/2014  . History of CVA (cerebrovascular accident)   . Essential hypertension   . HLD (hyperlipidemia)   . Encounter for long-term (current) use of high-risk medication 01/27/2013  . Patent foramen ovale with right to left shunt 01/27/2013  . Stroke, acute, embolic (HCC) 01/25/2013  . Cough 10/20/2012  . Chronic systolic congestive heart failure (HCC) 10/20/2012  . Penile pain 05/19/2012  . ATTENTION DEFICIT, W/HYPERACTIVITY 09/04/2006    Past Surgical History:  Procedure Laterality Date  . NO PAST SURGERIES    . SUBQ ICD IMPLANT N/A 01/10/2017   Procedure: SubQ ICD Implant;  Surgeon: Duke Salvia, MD;  Location: [facility] INVASIVE CV LAB;  Service: Cardiovascular;  Laterality: N/A;  . TEE WITHOUT CARDIOVERSION N/A 01/26/2013   Procedure: TRANSESOPHAGEAL ECHOCARDIOGRAM (TEE);  Surgeon: [name], MD;  Location: [facility] ENDOSCOPY;  Service: Cardiovascular;  Laterality: N/A;       Family History  Problem Relation Age of Onset  . Sarcoidosis Mother   . Heart Problems Mother        PPM implantation age 47    Social History   Tobacco Use  . Smoking status: Never Smoker  . Smokeless tobacco: Never Used  Substance Use Topics  . Alcohol use: Yes    Comment: occ  . Drug use: No  Home Medications Prior to Admission medications   Medication Sig Start Date End Date Taking? Authorizing Provider  apixaban (ELIQUIS) 5 MG TABS tablet Take 1 tablet (5 mg total) by mouth 2 (two) times daily. 07/02/18   Duke Salvia, MD  atorvastatin (LIPITOR) 10 MG tablet Take 1 tablet (10 mg total) by mouth daily at 6 PM. Patient taking differently: Take 10 mg by mouth every morning.  10/24/16   Nahser, Deloris Ping, MD  carvedilol (COREG) 12.5 MG tablet Take 1 tablet (12.5 mg  total) by mouth 2 (two) times daily with a meal. Please make overdue appt with Dr. Elease Hashimoto before anymore refills. 1st attempt 01/22/19   Nahser, Deloris Ping, MD  methocarbamol (ROBAXIN) 500 MG tablet Take 1 tablet (500 mg total) by mouth 2 (two) times daily. 07/16/18   Felicie Morn, NP  nitroGLYCERIN (NITROSTAT) 0.4 MG SL tablet Place 1 tablet (0.4 mg total) under the tongue every 5 (five) minutes x 3 doses as needed for chest pain. 02/21/18   Regalado, Belkys A, MD  sacubitril-valsartan (ENTRESTO) 49-51 MG Take 1 tablet by mouth 2 (two) times daily. Please keep upcoming appt for refills. Thank you 08/11/19   Nahser, Deloris Ping, MD  spironolactone (ALDACTONE) 25 MG tablet Take 1 tablet (25 mg total) by mouth daily. Please make overdue appt with [name] before anymore refills. 1st attempt 01/22/19   Nahser, Deloris Ping, MD    Allergies    Patient has no known allergies.  Review of Systems   Review of Systems  Constitutional: Negative.   HENT: Negative.   Respiratory: Negative.  Negative for shortness of breath.   Cardiovascular: Negative.  Negative for chest pain.  Gastrointestinal: Negative.  Negative for abdominal pain.  Genitourinary: Positive for penile pain. Negative for decreased urine volume, difficulty urinating, discharge, dysuria, enuresis, flank pain, frequency, genital sores, hematuria, penile swelling, scrotal swelling, testicular pain and urgency.  Musculoskeletal: Negative.   Skin: Negative.   Neurological: Negative.  Negative for headaches.  All other systems reviewed and are negative.   Physical Exam Updated Vital Signs BP 120/86 (BP Location: Left Arm)   Pulse 90   Temp 98.6 F (37 C) (Oral)   Resp 18   Ht 6\' 2"  (1.88 m)   Wt 121.1 kg   SpO2 100%   BMI 34.28 kg/m   Physical Exam Vitals and nursing note reviewed. Exam conducted with a chaperone present.  Constitutional:      General: He is not in acute distress.    Appearance: He is well-developed. He is not ill-appearing,  toxic-appearing or diaphoretic.  HENT:     Head: Normocephalic and atraumatic.     Nose: Nose normal.     Mouth/Throat:     Mouth: Mucous membranes are moist.     Pharynx: Oropharynx is clear.  Eyes:     Pupils: Pupils are equal, round, and reactive to light.  Cardiovascular:     Rate and Rhythm: Normal rate and regular rhythm.     Pulses: Normal pulses.     Heart sounds: Normal heart sounds.  Pulmonary:     Effort: Pulmonary effort is normal. No respiratory distress.     Breath sounds: Normal breath sounds.  Abdominal:     General: Bowel sounds are normal. There is no distension.     Palpations: Abdomen is soft.  Genitourinary:    Penis: Normal and uncircumcised. No phimosis, paraphimosis, hypospadias, erythema, tenderness, discharge, swelling or lesions.      Testes: Normal.  Cremasteric reflex is present.     Epididymis:     Right: Normal.     Left: Normal.  Musculoskeletal:        General: Normal range of motion.     Cervical back: Normal range of motion and neck supple.  Skin:    General: Skin is warm and dry.  Neurological:     Mental Status: He is alert.     ED Results / Procedures / Treatments   Labs (all labs ordered are listed, but only abnormal results are displayed) Labs Reviewed  GC/CHLAMYDIA PROBE AMP ([place]) NOT AT [facility]    EKG None  Radiology No results found.  Procedures Procedures (including critical care time)  Medications Ordered in ED Medications  cefTRIAXone (ROCEPHIN) injection 500 mg (has no administration in time range)    ED Course  I have reviewed the triage vital signs and the nursing notes.  Pertinent labs & imaging results that were available during my care of the patient were reviewed by me and considered in my medical decision making (see chart for details).  39 year old male appears otherwise well presents for evaluation of dysuria consistent with prior episodes of chlamydia.  Sexually active with male partners.  No  discharge.  No pain with bowel movements.  No rashes or lesions.  Patient is afebrile without abdominal tenderness, abdominal pain or painful bowel movements to indicate prostatitis.  No tenderness to palpation of the testes or epididymis to suggest orchitis or epididymitis.  STD cultures obtained including   gonorrhea and chlamydia. Declines HIV, syphilis testing. Patient to be discharged with instructions to follow up with PCP. Discussed importance of using protection when sexually active. Pt understands that they have GC/Chlamydia cultures pending and that they will need to inform all sexual partners if results return positive. Patient has been treated prophylactically with Doxy and Rocephin.   The patient has been appropriately medically screened and/or stabilized in the ED. I have low suspicion for any other emergent medical condition which would require further screening, evaluation or treatment in the ED or require inpatient management.  Patient is hemodynamically stable and in no acute distress.  Patient able to ambulate in department prior to ED.  Evaluation does not show acute pathology that would require ongoing or additional emergent interventions while in the emergency department or further inpatient treatment.  I have discussed the diagnosis with the patient and answered all questions.  Pain is been managed while in the emergency department and patient has no further complaints prior to discharge.  Patient is comfortable with plan discussed in room and is stable for discharge at this time.  I have discussed strict return precautions for returning to the emergency department.  Patient was encouraged to follow-up with PCP/specialist refer to at discharge.    MDM Rules/Calculators/A&P                      Final Clinical Impression(s) / ED Diagnoses Final diagnoses:  Dysuria  Concern about STD in male without diagnosis    Rx / DC Orders ED Discharge Orders    None       Laporsche Hoeger,  Jaiah Weigel A, PA-C 08/23/19 2051    Charlesetta Shanks, MD 09/03/19 1328

## 2019-08-23 NOTE — ED Triage Notes (Signed)
Pt c/o penile pain x 2 days-denies d/c but feels he has a STD-NAD-steady gait

## 2019-08-23 NOTE — Discharge Instructions (Signed)
You will be notified about your STD testing.  If positive you will need to notify your partners.  If you have any new worsening symptoms please seek reevaluation

## 2019-08-25 LAB — GC/CHLAMYDIA PROBE AMP (~~LOC~~) NOT AT ARMC
Chlamydia: NEGATIVE
Neisseria Gonorrhea: NEGATIVE

## 2019-09-11 NOTE — Progress Notes (Deleted)
Cardiology Office Note Date:  09/11/2019  Patient ID:  Nathan Baird, DOB [birth year], MRN [id number] PCP:  Deboraha Sprang, MD  Electrophysiologist:  Dr. Caryl Comes  ***refresh   Chief Complaint: *** overdue   History of Present Illness: Nathan Baird is a 39 y.o. male with history of NICM, CVA, chronic CHF (systolic), S-ICD.  He comes in today to be seen for Dr. Caryl Comes, last seen by him Dec 2019.  At that time discussed AFib noted via his device that seemed a new finding, and started on Eliquis. Telephone notes regarding ?DOT clearance noted.  With reduced LVEF, ICD not candidate   *** bleeding, eliquis, labs *** volume *** battery and lead advisories *** discuss and demonstrate tones, any shocks, appropriate therapies *** meds, CM *** Jan CT  ++ Coivd, pneumonia   Device information BSCi S-ICD, implanted 01/10/17, primary prevention Device is a Emblem [id number]  [id number], electrode [id number],  is on the early depletion and electrode advisories + remotes *** no hx of appropriate therapies  Past Medical History:  Diagnosis Date  . CHF (congestive heart failure) ([place])   . Cough    a. PFTS 09/2012: restriction probable, further examinations recommended. Saw pulm 11/2012: placed on GERD rx and short course prednisone, planned to regroup.  . CVA (cerebral vascular accident) ([place]) 01/23/2013   tPA administered; MRI brain showed small acute R MCA infarct  . History of palpitations    2x/month for several years feels heart racing  . Nonischemic cardiomyopathy ([place])    a. Identified 10/2012 by echo EF 20-25%. b. Echo 01/2013: EF 20-25%, TEE EF 15% with severe RV dysfunction as well.  [name] PFO (patent foramen ovale)    a. By TEE 01/2013.    Past Surgical History:  Procedure Laterality Date  . NO PAST SURGERIES    . SUBQ ICD IMPLANT N/A 01/10/2017   Procedure: SubQ ICD Implant;  Surgeon: Deboraha Sprang, MD;  Location: [place] CV LAB;  Service: Cardiovascular;  Laterality: N/A;  . TEE WITHOUT  CARDIOVERSION N/A 01/26/2013   Procedure: TRANSESOPHAGEAL ECHOCARDIOGRAM (TEE);  Surgeon: Lelon Perla, MD;  Location: [facility] ENDOSCOPY;  Service: Cardiovascular;  Laterality: N/A;    Current Outpatient Medications  Medication Sig Dispense Refill  . apixaban (ELIQUIS) 5 MG TABS tablet Take 1 tablet (5 mg total) by mouth 2 (two) times daily. 60 tablet 11  . atorvastatin (LIPITOR) 10 MG tablet Take 1 tablet (10 mg total) by mouth daily at 6 PM. (Patient taking differently: Take 10 mg by mouth every morning. ) 90 tablet 3  . carvedilol (COREG) 12.5 MG tablet Take 1 tablet (12.5 mg total) by mouth 2 (two) times daily with a meal. Please make overdue appt with Dr. Acie Fredrickson before anymore refills. 1st attempt 60 tablet 0  . doxycycline (VIBRAMYCIN) 100 MG capsule Take 1 capsule (100 mg total) by mouth 2 (two) times daily. 20 capsule 0  . methocarbamol (ROBAXIN) 500 MG tablet Take 1 tablet (500 mg total) by mouth 2 (two) times daily. 20 tablet 0  . nitroGLYCERIN (NITROSTAT) 0.4 MG SL tablet Place 1 tablet (0.4 mg total) under the tongue every 5 (five) minutes x 3 doses as needed for chest pain. 10 tablet 12  . sacubitril-valsartan (ENTRESTO) 49-51 MG Take 1 tablet by mouth 2 (two) times daily. Please keep upcoming appt for refills. Thank you 60 tablet 1  . spironolactone (ALDACTONE) 25 MG tablet Take 1 tablet (25 mg total) by mouth daily. Please make overdue  appt with [name] before anymore refills. 1st attempt 90 tablet 2   No current facility-administered medications for this visit.    Allergies:   Patient has no known allergies.   Social History:  The patient  reports that he has never smoked. He has never used smokeless tobacco. He reports current alcohol use. He reports that he does not use drugs.   Family History:  The patient's family history includes Heart Problems in his mother; Sarcoidosis in his mother.  ROS:  Please see the history of present illness.  All other systems are reviewed and  otherwise negative.   PHYSICAL EXAM: *** VS:  There were no vitals taken for this visit. BMI: There is no height or weight on file to calculate BMI. Well nourished, well developed, in no acute distress  HEENT: normocephalic, atraumatic  Neck: no JVD, carotid bruits or masses Cardiac:  *** RRR; no significant murmurs, no rubs, or gallops Lungs: *** CTA b/l, no wheezing, rhonchi or rales  Abd: soft, nontender MS: no deformity or *** atrophy Ext: *** no edema  Skin: warm and dry, no rash Neuro:  No gross deficits appreciated Psych: euthymic mood, full affect  *** ICD site is stable, no tethering or discomfort   EKG:  Not done today 07/21/2019  SR 98, nonspecific T changes SR 83, PVCs  ICD interrogation done today and reviewed by myself: ***    02/21/2018: TTE Study Conclusions  - Left ventricle: The cavity size was moderately dilated. Systolic  function was severely reduced. The estimated ejection fraction  was in the range of 20% to 25%. Diffuse hypokinesis. Features are  consistent with a pseudonormal left ventricular filling pattern,  with concomitant abnormal relaxation and increased filling  pressure (grade 2 diastolic dysfunction). There was no evidence  of elevated ventricular filling pressure by Doppler parameters.  - Aortic valve: There was no regurgitation.  - Mitral valve: There was trivial regurgitation.  - Right ventricle: The cavity size was mildly dilated. Wall  thickness was normal. Systolic function was mildly reduced.  - Right atrium: The atrium was normal in size.  - Tricuspid valve: There was trivial regurgitation.  - Pulmonic valve: There was no regurgitation.  - Pulmonary arteries: Systolic pressure was within the normal  range.  - Inferior vena cava: The vessel was normal in size.  - Pericardium, extracardiac: There was no pericardial effusion.   Impressions:  - There is no significant difference from the prior study on  11/07/2016.      Pertinent Cardiac Studies and Imaging / Results (bolded with details below): DATE TEST EF Notes  01/26/2013 TEE EF 15% Patent foramen ovale   02/08/13 cMRI EF 26is%   05/21/13 Echo 30%   12/20/13 Echo 20-25% *cannot r/o apical thrombus  05/20/14 Echo EF 15-20%   10/17/14 Echo EF 25-30%   12//15/16 Myo 34%   11/07/16 TTE EF 20-25%    11/07/2016 TTE EF 25-30% Study Conclusions - Left ventricle: The cavity size was mildly to moderately dilated. Wall thickness was normal. Systolic function was severely reduced. The estimated ejection fraction was in the range of 25% to 30%. Diffuse hypokinesis. Doppler parameters are consistent with abnormal left ventricular relaxation (grade 1 diastolic dysfunction). - Aortic valve: There was no stenosis. - Aorta: Mildly dilated aortic root. Aortic root dimension: 42 mm (ED). - Mitral valve: There was no significant regurgitation. - Right ventricle: The cavity size was normal. Systolic function was normal. - Pulmonary arteries: No complete TR doppler jet  so unable to estimate PA systolic pressure. - Inferior vena cava: The vessel was normal in size. The respirophasic diameter changes were in the normal range (>= 50%), consistent with normal central venous pressure. Impressions: Mild to moderate LV dilation with EF 25-30%, diffuse hypokinesis. Normal RV size and systolic function. No significant valvular abnormalities. This study is similar to the prior echo.   02/08/2013 cMRI 1) Severe LVE with diffuse hypokinesis EF 26% 2) Findings consistent with ventricular non-compaction 3) No infiltration or scar in LV myocardium 4) Mild RV enlargement 5) PFO 6) No LAA thrombus 7) Mild LAE  Recent Labs: 07/21/2019: ALT 29; BUN 8; Creatinine, Ser 0.99; Hemoglobin 15.8; Platelets 273; Potassium 3.7; Sodium 137  No results found for requested labs within last 8760 hours.   CrCl cannot be  calculated (Patient's most recent lab result is older than the maximum 21 days allowed.).   Wt Readings from Last 3 Encounters:  08/23/19 267 lb (121.1 kg)  07/21/19 265 lb (120.2 kg)  07/16/18 250 lb (113.4 kg)     Other studies reviewed: Additional studies/records reviewed today include: summarized above***  ASSESSMENT AND PLAN:  *** Coumadin or OAC Labs, CHADS *** AAD, labs/EKG, testing needed? *** site check *** remotes *** device info  1. ***  Disposition: F/u with ***  Current medicines are reviewed at length with the patient today.  The patient did not have any concerns regarding medicines.***  Signed, Francis Dowse, PA-C 09/11/2019 10:17 AM     [facility] [address] [place] [address] [phone] (office)  [phone] (fax)

## 2019-09-13 ENCOUNTER — Encounter: Payer: BLUE CROSS/BLUE SHIELD | Admitting: Physician Assistant

## 2019-09-13 ENCOUNTER — Encounter: Payer: Self-pay | Admitting: Cardiovascular Disease

## 2019-09-13 NOTE — Progress Notes (Signed)
Cardiology Office Note   Date:  09/14/2019   ID:  Nathan Baird, DOB October 02, 1980, MRN [id number]  PCP:  Duke Salvia, MD  Cardiologist:   Kristeen Miss, MD   Chief Complaint  Patient presents with  . Congestive Heart Failure  . Cerebrovascular Accident   1. Congestive heart failure-ejection fraction of 20-25% by echo 2. Restrictive lung disease 3. CVA 4. Right shoulder injury-   Truth is a 39 year old gentleman who is a new consultation for further evaluation of his cough. He had an echocardiogram and was found to have ejection fraction of between 20 and 25%. I started him on Lasix 40 mg a day as well as potassium chloride 20 mg a day. He developed severe orthostatic hypotension and felt very poorly after taking one dose Lasix and he stopped.  Surprisingly, he does not have much shortness of breath. His basketball on a regular basis. He is able to play for hours at a time and does not have any significant shortness of breath. He does have a cough. He's tried a Z-Pak which cleared up the greenish color of his sputum but he is still having this cough.  He denies any PND orthopnea. He denies any syncope or presyncope. He denies any chest pain. He does not have any cardiac history.  Dec 02, 2012: Nathan Baird is feeling a bit better. His cough has improved. He still has episodes of profound fatigue. His breathing is better. He has greatly reduced his salt intake. He still plays basket ball on a regular basis.   March 10, 2013: Nov. 3, 2014: Nathan Baird has gained some weight since his last ov. He complains of a slight head ache / dizziness. He has occasional episodes of orthostasis. Otherwise, he is feeling well - breathing well.   Jan. 30, 2015: Nathan Baird is doing OK. He played basketball 2 weeks ago and felt great. He was able to play without dyspnea.  His most recent echocardiogram in November revealed improvement of his left ventricular systolic function. The official reading on  the echo reports an ejection fraction of 30%  Reviewed the echo and I think that his ejection fraction is actually a little bit better- at least 35%. In one view, the Simpson calculation reported 42%.  October 08, 2013: Nathan Baird seems to be doing fairly well. We increased his losartan to 100 mg a day during his last visit but he started having episodes of lightheadedness any developed a headache. The symptoms resolved when he decreased his dose back down to the 50 mg where we were previously.  Nov 09, 2013: Nathan Baird is doing ok.   December 10, 2013: Nathan Baird is doing well  Sept. 8, 2015: Nathan Baird is doing well. No dyspnea. His last echocardiogram revealed his ejection fraction had decreased back down to 25%. He thinks that his EF has gone back up because he is not having shortness breath and his cough is better.  We have referred him to [name] for consideration for an ICD. He does not have a left bundle branch block so I do not think that he needs a CRT.   Jan. 8, 2016: Nathan Baird had another TIA recently. Was in the hospital. CT showed no new CVA He had stopped his Xarelto.[name] He has run out of his Xarelto again. Also ran out of the losartan or another med. He is feeling much better.   Feb. 18, 2016:  Nathan Baird is a 39 y.o. male who presents for follow up of his  chronic systolic CHf.   He has a right injury - needs to take NSAID.  His sports medicine doctor would not give him anything yet.    November 02, 2014: Nathan Baird is seen back today for follow up visit.  Recent echo shows persistent LV dysfunction - EF 25-30%. Has been on a diet.  Has lost 15 lbs.   Has occasional episodes of orthostasis - I do not think that we can increase his meds any further .   January 30, 2015:  Nathan Baird is doing well. Has seen EP. Just got a new position and wants to wait until he is out of the probation  period.  Is taking his meds regularly   Nov. 9, 2016:  Doing well.  Has gained some weight .  Wants to go ahead  with the ICD.   Last EF earlier this year is 25-30%.   October 24, 2016:  Nathan Baird is doing ok. Has some fatigue.  Checks BP every 3-4 days .   has not had an AICD placed  .   Missed the appt.   .  September 14, 2019 Nathan Baird is seen for follow up of his CHF. He has had his leadless  ICD placed  He has been found to have atrial fib and has been started on Eliquis   VS look great .  Had COVID in January .  CT of the chest showed no evidence of PE.  Did have a pneumonia   Breathing is back to normal  Dyspnea lasted several months  His last echo was Aug 2019 - EF 20-25%.   Grade 2 diastolic dysfunctino  Still very active.   Works for [employer].   Works on a truck .    Past Medical History:  Diagnosis Date  . CHF (congestive heart failure) (HCC)   . Cough    a. PFTS 09/2012: restriction probable, further examinations recommended. Saw pulm 11/2012: placed on GERD rx and short course prednisone, planned to regroup.  . CVA (cerebral vascular accident) (HCC) 01/23/2013   tPA administered; MRI brain showed small acute R MCA infarct  . History of palpitations    2x/month for several years feels heart racing  . Nonischemic cardiomyopathy (HCC)    a. Identified 10/2012 by echo EF 20-25%. b. Echo 01/2013: EF 20-25%, TEE EF 15% with severe RV dysfunction as well.  [name] PFO (patent foramen ovale)    a. By TEE 01/2013.    Past Surgical History:  Procedure Laterality Date  . NO PAST SURGERIES    . SUBQ ICD IMPLANT N/A 01/10/2017   Procedure: SubQ ICD Implant;  Surgeon: Duke Salvia, MD;  Location: [facility] INVASIVE CV LAB;  Service: Cardiovascular;  Laterality: N/A;  . TEE WITHOUT CARDIOVERSION N/A 01/26/2013   Procedure: TRANSESOPHAGEAL ECHOCARDIOGRAM (TEE);  Surgeon: [name], MD;  Location: [facility] ENDOSCOPY;  Service: Cardiovascular;  Laterality: N/A;     Current Outpatient Medications  Medication Sig Dispense Refill  . apixaban (ELIQUIS) 5 MG TABS tablet Take 1 tablet (5 mg total) by mouth 2 (two) times  daily. 60 tablet 11  . atorvastatin (LIPITOR) 10 MG tablet Take 1 tablet (10 mg total) by mouth daily at 6 PM. 90 tablet 3  . carvedilol (COREG) 12.5 MG tablet Take 1 tablet (12.5 mg total) by mouth 2 (two) times daily with a meal. Please make overdue appt with Dr. Elease Hashimoto before anymore refills. 1st attempt 60 tablet 0  . methocarbamol (ROBAXIN) 500 MG tablet Take 1  tablet (500 mg total) by mouth 2 (two) times daily. 20 tablet 0  . sacubitril-valsartan (ENTRESTO) 49-51 MG Take 1 tablet by mouth 2 (two) times daily. Please keep upcoming appt for refills. Thank you 60 tablet 1  . spironolactone (ALDACTONE) 25 MG tablet Take 1 tablet (25 mg total) by mouth daily. Please make overdue appt with Dr. Caryl Comes before anymore refills. 1st attempt 90 tablet 2   No current facility-administered medications for this visit.    Allergies:   Patient has no known allergies.    Social History:  The patient  reports that he has never smoked. He has never used smokeless tobacco. He reports current alcohol use. He reports that he does not use drugs.   Family History:  The patient's family history includes Heart Problems in his mother; Sarcoidosis in his mother.    ROS:  Please see the history of present illness.       All other systems are reviewed and negative.    Physical Exam: Blood pressure 118/84, pulse 79, height 6\' 3"  (1.905 m), weight 264 lb 4 oz (119.9 kg), SpO2 99 %.  GEN:  Young man,  Mildly obese  HEENT: Normal NECK: No JVD; No carotid bruits LYMPHATICS: No lymphadenopathy CARDIAC: RRR , no murmurs, rubs, gallops RESPIRATORY:  Clear to auscultation without rales, wheezing or rhonchi  ABDOMEN: Soft, non-tender, non-distended MUSCULOSKELETAL:  No edema; No deformity  SKIN: Warm and dry NEUROLOGIC:  Alert and oriented x 3  EKG:     Recent Labs: 07/21/2019: ALT 29; BUN 8; Creatinine, Ser 0.99; Hemoglobin 15.8; Platelets 273; Potassium 3.7; Sodium 137    Lipid Panel    Component Value  Date/Time   CHOL 139 02/21/2018 0522   CHOL 141 10/24/2016 1228   TRIG 96 02/21/2018 0522   HDL 43 02/21/2018 0522   HDL 50 10/24/2016 1228   CHOLHDL 3.2 02/21/2018 0522   VLDL 19 02/21/2018 0522   LDLCALC 77 02/21/2018 0522   LDLCALC 72 10/24/2016 1228      Wt Readings from Last 3 Encounters:  09/14/19 264 lb 4 oz (119.9 kg)  08/23/19 267 lb (121.1 kg)  07/21/19 265 lb (120.2 kg)      Other studies Reviewed: Additional studies/ records that were reviewed today include: . Review of the above records demonstrates:    ASSESSMENT AND PLAN:  1. Chronic systolic Congestive heart failure-he is on good medical therapy.  His ejection fraction has not improved much over the years.  His last echocardiogram was 2019.  We will recheck an echocardiogram now.  Continue medications.   2. Restrictive lung disease -he has very little symptoms from his lung disease.  Continue to follow-up with his primary medical doctor.  3. CVA -currently on anticoagulation.  4. Right shoulder injury.  Current medicines are reviewed at length with the patient today.  The patient does not have concerns regarding medicines.  The following changes have been made:  no change   Disposition:   Follow up with APP in 6 months   Signed, Mertie Moores, MD  09/14/2019 4:43 PM    [facility] [place], [place], [place]  [id number] Phone: [phone]; Fax: [phone]

## 2019-09-14 ENCOUNTER — Other Ambulatory Visit: Payer: Self-pay

## 2019-09-14 ENCOUNTER — Ambulatory Visit (INDEPENDENT_AMBULATORY_CARE_PROVIDER_SITE_OTHER): Payer: BLUE CROSS/BLUE SHIELD | Admitting: Cardiovascular Disease

## 2019-09-14 ENCOUNTER — Encounter: Payer: Self-pay | Admitting: Cardiovascular Disease

## 2019-09-14 VITALS — BP 118/84 | HR 79 | Ht 75.0 in | Wt 264.2 lb

## 2019-09-14 DIAGNOSIS — Z95810 Presence of automatic (implantable) cardiac defibrillator: Secondary | ICD-10-CM

## 2019-09-14 DIAGNOSIS — Z8673 Personal history of transient ischemic attack (TIA), and cerebral infarction without residual deficits: Secondary | ICD-10-CM | POA: Diagnosis not present

## 2019-09-14 DIAGNOSIS — I5022 Chronic systolic (congestive) heart failure: Secondary | ICD-10-CM

## 2019-09-14 MED ORDER — SPIRONOLACTONE 25 MG PO TABS: 25.0000 mg | ORAL_TABLET | Freq: Every day | ORAL | 3 refills | Status: DC

## 2019-09-14 MED ORDER — CARVEDILOL 12.5 MG PO TABS: 12.5000 mg | ORAL_TABLET | Freq: Two times a day (BID) | ORAL | 3 refills | Status: DC

## 2019-09-14 MED ORDER — ENTRESTO 49-51 MG PO TABS: 1.0000 | ORAL_TABLET | Freq: Two times a day (BID) | ORAL | 3 refills | Status: DC

## 2019-09-14 NOTE — Patient Instructions (Addendum)
Medication Instructions:  Your physician recommends that you continue on your current medications as directed. Please refer to the Current Medication list given to you today.  *If you need a refill on your cardiac medications before your next appointment, please call your pharmacy*   Lab Work: None Ordered If you have labs (blood work) drawn today and your tests are completely normal, you will receive your results only by: [name] MyChart Message (if you have MyChart) OR . A paper copy in the mail If you have any lab test that is abnormal or we need to change your treatment, we will call you to review the results.   Testing/Procedures: Your physician has requested that you have an echocardiogram. Echocardiography is a painless test that uses sound waves to create images of your heart. It provides your doctor with information about the size and shape of your heart and how well your heart's chambers and valves are working. This procedure takes approximately one hour. There are no restrictions for this procedure.     Follow-Up: You need to reschedule your Phys Defib Check appointment that was cancelled for yesterday  At [facility], you and your health needs are our priority.  As part of our continuing mission to provide you with exceptional heart care, we have created designated Provider Care Teams.  These Care Teams include your primary Cardiologist (physician) and Advanced Practice Providers (APPs -  Physician Assistants and Nurse Practitioners) who all work together to provide you with the care you need, when you need it.  We recommend signing up for the patient portal called "MyChart".  Sign up information is provided on this After Visit Summary.  MyChart is used to connect with patients for Virtual Visits (Telemedicine).  Patients are able to view lab/test results, encounter notes, upcoming appointments, etc.  Non-urgent messages can be sent to your provider as well.   To learn more about  what you can do with MyChart, go to [email or web address].    Your next appointment:   6 month(s)  The format for your next appointment:   Either In Person or Virtual  Provider:   Tereso Newcomer, PA-C or Chelsea Aus, PA-C

## 2019-09-22 ENCOUNTER — Encounter (HOSPITAL_COMMUNITY): Payer: Self-pay | Admitting: Cardiovascular Disease

## 2019-10-06 ENCOUNTER — Other Ambulatory Visit: Payer: Self-pay

## 2019-10-06 ENCOUNTER — Telehealth (HOSPITAL_COMMUNITY): Payer: Self-pay | Admitting: Cardiovascular Disease

## 2019-10-06 ENCOUNTER — Encounter (HOSPITAL_BASED_OUTPATIENT_CLINIC_OR_DEPARTMENT_OTHER): Payer: Self-pay | Admitting: *Deleted

## 2019-10-06 ENCOUNTER — Emergency Department (HOSPITAL_BASED_OUTPATIENT_CLINIC_OR_DEPARTMENT_OTHER): Payer: BLUE CROSS/BLUE SHIELD

## 2019-10-06 ENCOUNTER — Emergency Department (HOSPITAL_BASED_OUTPATIENT_CLINIC_OR_DEPARTMENT_OTHER)
Admission: EM | Admit: 2019-10-06 | Discharge: 2019-10-06 | Disposition: A | Discharge status: Home / Self Care | Payer: BLUE CROSS/BLUE SHIELD | Attending: Emergency Medicine | Admitting: Emergency Medicine

## 2019-10-06 DIAGNOSIS — E785 Hyperlipidemia, unspecified: Secondary | ICD-10-CM | POA: Insufficient documentation

## 2019-10-06 DIAGNOSIS — I5022 Chronic systolic (congestive) heart failure: Secondary | ICD-10-CM | POA: Diagnosis not present

## 2019-10-06 DIAGNOSIS — I4891 Unspecified atrial fibrillation: Secondary | ICD-10-CM

## 2019-10-06 DIAGNOSIS — Z7901 Long term (current) use of anticoagulants: Secondary | ICD-10-CM | POA: Diagnosis not present

## 2019-10-06 DIAGNOSIS — I110 Hypertensive heart disease with heart failure: Secondary | ICD-10-CM | POA: Diagnosis not present

## 2019-10-06 DIAGNOSIS — Z79899 Other long term (current) drug therapy: Secondary | ICD-10-CM | POA: Diagnosis not present

## 2019-10-06 DIAGNOSIS — Z8673 Personal history of transient ischemic attack (TIA), and cerebral infarction without residual deficits: Secondary | ICD-10-CM | POA: Insufficient documentation

## 2019-10-06 DIAGNOSIS — R0602 Shortness of breath: Secondary | ICD-10-CM | POA: Diagnosis present

## 2019-10-06 LAB — COMPREHENSIVE METABOLIC PANEL
ALT: 23 U/L (ref 0–44)
AST: 21 U/L (ref 15–41)
Albumin: 4 g/dL (ref 3.5–5.0)
Alkaline Phosphatase: 54 U/L (ref 38–126)
Anion gap: 12 (ref 5–15)
BUN: 20 mg/dL (ref 6–20)
CO2: 24 mmol/L (ref 22–32)
Calcium: 9.7 mg/dL (ref 8.9–10.3)
Chloride: 107 mmol/L (ref 98–111)
Creatinine, Ser: 1.03 mg/dL (ref 0.61–1.24)
GFR calc Af Amer: >60 mL/min (ref >60)
GFR calc non Af Amer: >60 mL/min (ref >60)
Glucose, Bld: 124 mg/dL — ABNORMAL HIGH (ref 70–99)
Potassium: 4.2 mmol/L (ref 3.5–5.1)
Sodium: 143 mmol/L (ref 135–145)
Total Bilirubin: 0.6 mg/dL (ref 0.3–1.2)
Total Protein: 7.2 g/dL (ref 6.5–8.1)

## 2019-10-06 LAB — CBC WITH DIFFERENTIAL/PLATELET
Abs Immature Granulocytes: 0.02 10*3/uL (ref 0.00–0.07)
Basophils Absolute: 0 10*3/uL (ref 0.0–0.1)
Basophils Relative: 0 %
Eosinophils Absolute: 0.2 10*3/uL (ref 0.0–0.5)
Eosinophils Relative: 3 %
HCT: 52.1 % — ABNORMAL HIGH (ref 39.0–52.0)
Hemoglobin: 17.4 g/dL — ABNORMAL HIGH (ref 13.0–17.0)
Immature Granulocytes: 0 %
Lymphocytes Relative: 43 %
Lymphs Abs: 2.6 10*3/uL (ref 0.7–4.0)
MCH: 29.5 pg (ref 26.0–34.0)
MCHC: 33.4 g/dL (ref 30.0–36.0)
MCV: 88.5 fL (ref 80.0–100.0)
Monocytes Absolute: 0.5 10*3/uL (ref 0.1–1.0)
Monocytes Relative: 8 %
Neutro Abs: 2.7 10*3/uL (ref 1.7–7.7)
Neutrophils Relative %: 46 %
Platelets: 346 10*3/uL (ref 150–400)
RBC: 5.89 MIL/uL — ABNORMAL HIGH (ref 4.22–5.81)
RDW: 12.8 % (ref 11.5–15.5)
WBC: 6 10*3/uL (ref 4.0–10.5)
nRBC: 0 % (ref 0.0–0.2)

## 2019-10-06 LAB — MAGNESIUM: Magnesium: 2.2 mg/dL (ref 1.7–2.4)

## 2019-10-06 MED ORDER — CARVEDILOL 6.25 MG PO TABS
6.2500 mg | ORAL_TABLET | Freq: Once | ORAL | Status: AC
  Administered 2019-10-06: 6.25 mg via ORAL
  Filled 2019-10-06: qty 1

## 2019-10-06 MED ORDER — METOPROLOL TARTRATE 5 MG/5ML IV SOLN
5.0000 mg | Freq: Once | INTRAVENOUS | Status: AC
  Administered 2019-10-06: 5 mg via INTRAVENOUS
  Filled 2019-10-06: qty 5

## 2019-10-06 NOTE — ED Triage Notes (Addendum)
Shortness of breath started last night.  Patient stated that he felt his heart is racing.

## 2019-10-06 NOTE — ED Notes (Signed)
ED Provider at bedside. 

## 2019-10-06 NOTE — Discharge Instructions (Signed)
Start taking the 5 mg Eliquis 2 times a day until you are instructed otherwise.  Increase your carvedilol to 1-1/2 tabs 2 times a day until you see Dr. Elease Hashimoto.  If you start having severe chest pain, severe shortness of breath or any other concerns she can return to the emergency room.

## 2019-10-06 NOTE — ED Provider Notes (Signed)
[facility] EMERGENCY DEPARTMENT Provider Note   CSN: [id number] Arrival date & time: 10/06/19  4680     History Chief Complaint  Patient presents with  . Shortness of Breath  . Irregular heart beat    Nathan Baird is a 39 y.o. male.  Patient is a 39 year old male with a history of cardiomyopathy with EF of 20 to 25% with ICD, CHF on Entresto and spironolactone and prior CVA who presents today with palpitations and shortness of breath.  Patient states he was in his normal state of health all day yesterday until last night when he started to have palpitations.  It made him feel winded and tired.  He was hoping it would go away and he went to bed however when he woke up this morning it is no better and he continues to have the palpitations.  When he tries to do anything it makes him feel very tired and he says he just does not feel right.  He has had prior history of atrial fibrillation with cardioversion in the past.  He however had stopped taking his Eliquis and recently had started back on it 4 days ago.  Prior to that he was taking a baby aspirin.  He is still taking all of his other heart medication and denies any recent leg swelling, increased weight gain or other concerns.  He did see cardiology a few weeks ago and was scheduled for a new echo in the next few weeks.  He denies alcohol, drug or tobacco use.  The history is provided by the patient.  Shortness of Breath Severity:  Mild Onset quality:  Sudden Timing:  Constant Chronicity:  Recurrent Context: not URI   Associated symptoms: no chest pain, no cough, no fever and no sputum production        Past Medical History:  Diagnosis Date  . CHF (congestive heart failure) (HCC)   . Cough    a. PFTS 09/2012: restriction probable, further examinations recommended. Saw pulm 11/2012: placed on GERD rx and short course prednisone, planned to regroup.  . CVA (cerebral vascular accident) (HCC) 01/23/2013   tPA administered;  MRI brain showed small acute R MCA infarct  . History of palpitations    2x/month for several years feels heart racing  . Nonischemic cardiomyopathy (HCC)    a. Identified 10/2012 by echo EF 20-25%. b. Echo 01/2013: EF 20-25%, TEE EF 15% with severe RV dysfunction as well.  [name] PFO (patent foramen ovale)    a. By TEE 01/2013.    Patient Active Problem List   Diagnosis Date Noted  . Chest pain 02/19/2018  . NICM (nonischemic cardiomyopathy) (HCC) 01/10/2017  . Trapezius muscle strain 11/24/2014  . Right shoulder pain 08/24/2014  . TIA (transient ischemic attack) 05/20/2014  . History of CVA (cerebrovascular accident)   . Essential hypertension   . HLD (hyperlipidemia)   . Encounter for long-term (current) use of high-risk medication 01/27/2013  . Patent foramen ovale with right to left shunt 01/27/2013  . Stroke, acute, embolic (HCC) 01/25/2013  . Cough 10/20/2012  . Chronic systolic congestive heart failure (HCC) 10/20/2012  . Penile pain 05/19/2012  . ATTENTION DEFICIT, W/HYPERACTIVITY 09/04/2006    Past Surgical History:  Procedure Laterality Date  . NO PAST SURGERIES    . SUBQ ICD IMPLANT N/A 01/10/2017   Procedure: SubQ ICD Implant;  Surgeon: Duke Salvia, MD;  Location: [facility] INVASIVE CV LAB;  Service: Cardiovascular;  Laterality: N/A;  . TEE WITHOUT  CARDIOVERSION N/A 01/26/2013   Procedure: TRANSESOPHAGEAL ECHOCARDIOGRAM (TEE);  Surgeon: Lelon Perla, MD;  Location: [facility] ENDOSCOPY;  Service: Cardiovascular;  Laterality: N/A;       Family History  Problem Relation Age of Onset  . Sarcoidosis Mother   . Heart Problems Mother        PPM implantation age 72    Social History   Tobacco Use  . Smoking status: Never Smoker  . Smokeless tobacco: Never Used  Substance Use Topics  . Alcohol use: Yes    Comment: occ  . Drug use: No    Home Medications Prior to Admission medications   Medication Sig Start Date End Date Taking? Authorizing Provider  apixaban (ELIQUIS) 5  MG TABS tablet Take 1 tablet (5 mg total) by mouth 2 (two) times daily. 07/02/18   Deboraha Sprang, MD  atorvastatin (LIPITOR) 10 MG tablet Take 1 tablet (10 mg total) by mouth daily at 6 PM. 10/24/16   Nahser, Wonda Cheng, MD  carvedilol (COREG) 12.5 MG tablet Take 1 tablet (12.5 mg total) by mouth 2 (two) times daily with a meal. 09/14/19   Nahser, Wonda Cheng, MD  methocarbamol (ROBAXIN) 500 MG tablet Take 1 tablet (500 mg total) by mouth 2 (two) times daily. 07/16/18   Etta Quill, NP  sacubitril-valsartan (ENTRESTO) 49-51 MG Take 1 tablet by mouth 2 (two) times daily. 09/14/19   Nahser, Wonda Cheng, MD  spironolactone (ALDACTONE) 25 MG tablet Take 1 tablet (25 mg total) by mouth daily. 09/14/19   Nahser, Wonda Cheng, MD    Allergies    Patient has no known allergies.  Review of Systems   Review of Systems  Constitutional: Negative for fever.  Respiratory: Positive for shortness of breath. Negative for cough and sputum production.   Cardiovascular: Negative for chest pain.  All other systems reviewed and are negative.   Physical Exam Updated Vital Signs BP 95/80   Pulse (!) 58   Temp 98.1 F (36.7 C)   Resp 15   Ht 6\' 2"  (1.88 m)   Wt 117.9 kg   SpO2 99%   BMI 33.38 kg/m   Physical Exam Vitals and nursing note reviewed.  Constitutional:      General: He is not in acute distress.    Appearance: He is well-developed.  HENT:     Head: Normocephalic and atraumatic.  Eyes:     Conjunctiva/sclera: Conjunctivae normal.     Pupils: Pupils are equal, round, and reactive to light.  Cardiovascular:     Rate and Rhythm: Tachycardia present. Rhythm irregularly irregular.     Heart sounds: No murmur.  Pulmonary:     Effort: Pulmonary effort is normal. No respiratory distress.     Breath sounds: Normal breath sounds. No wheezing or rales.  Abdominal:     General: There is no distension.     Palpations: Abdomen is soft.     Tenderness: There is no abdominal tenderness. There is no guarding or  rebound.  Musculoskeletal:        General: No tenderness. Normal range of motion.     Cervical back: Normal range of motion and neck supple.     Right lower leg: No edema.     Left lower leg: No edema.  Skin:    General: Skin is warm and dry.     Findings: No erythema or rash.  Neurological:     General: No focal deficit present.     Mental Status: He is  alert and oriented to person, place, and time. Mental status is at baseline.  Psychiatric:        Mood and Affect: Mood normal.        Behavior: Behavior normal.        Thought Content: Thought content normal.     ED Results / Procedures / Treatments   Labs (all labs ordered are listed, but only abnormal results are displayed) Labs Reviewed  CBC WITH DIFFERENTIAL/PLATELET - Abnormal; Notable for the following components:      Result Value   RBC 5.89 (*)    Hemoglobin 17.4 (*)    HCT 52.1 (*)    All other components within normal limits  COMPREHENSIVE METABOLIC PANEL - Abnormal; Notable for the following components:   Glucose, Bld 124 (*)    All other components within normal limits  MAGNESIUM    EKG EKG Interpretation  Date/Time:  Wednesday October 06 2019 08:46:34 EDT Ventricular Rate:  141 PR Interval:    QRS Duration: 89 QT Interval:  316 QTC Calculation: 484 R Axis:   50 Text Interpretation: recurrent Atrial fibrillation Borderline T abnormalities, inferior leads Borderline prolonged QT interval Confirmed by Gwyneth Sprout ([id number]) on 10/06/2019 9:07:41 AM   Radiology DG Chest Port 1 View  Result Date: 10/06/2019 CLINICAL DATA:  Tachycardia, palpitations and shortness of breath. EXAM: PORTABLE CHEST 1 VIEW COMPARISON:  07/21/2019 FINDINGS: Mild stable cardiac enlargement. The mediastinal and hilar contours are within normal limits and unchanged. Stable ICD. No acute pulmonary findings. No pleural effusions or pulmonary lesions. IMPRESSION: Mild stable cardiac enlargement.  No acute pulmonary findings.  Electronically Signed   By: Rudie Meyer M.D.   On: 10/06/2019 09:15    Procedures Procedures (including critical care time)  Medications Ordered in ED Medications  metoprolol tartrate (LOPRESSOR) injection 5 mg (has no administration in time range)    ED Course  I have reviewed the triage vital signs and the nursing notes.  Pertinent labs & imaging results that were available during my care of the patient were reviewed by me and considered in my medical decision making (see chart for details).    MDM Rules/Calculators/A&P                      Patient presenting today with palpitations and shortness of breath.  Patient is in atrial fibrillation with RVR and does have a prior history of the same.  However patient has stopped taking his Eliquis and just recently started back 4 days ago.  And he is only taking the Eliquis 1 time a day.  Patient has no other systemic symptoms other than feeling winded and tired since the palpitations started.  He has not had a preceding illness or other concerns.  He is not have significant signs of fluid overload today and is in no acute distress.  He did take all of his medications this morning.  Labs are pending.  Patient given 5 mg of IV Lopressor to see if we can achieve rate control.  However because he is not anticoagulated symptoms started over 12 hours ago he is most likely not a candidate for cardioversion from the emergency room.  10:20 AM Labs and x-ray without significant findings.  Magnesium is also within normal limits.  After IV Lopressor patient's heart rate has been between 90-1 10.  Discussed with Dr. Tresa Endo with cardiology and he recommended increasing carvedilol with an additional 6.25.  Patient is already taken 12.5 this morning so  we will give 6.25 here and monitor for another hour to ensure his blood pressure tolerates the increase medication.  Then will encourage patient to start the Eliquis 5 mg 2 times a day and follow-up with Dr. Elease Hashimoto  either Friday or Monday for repeat evaluation.  11:44 AM After carvedilol patient remained with rates between 90-1 10.  He was walked around the department and the highest his heart rate went was to 120 and then improved back to the low 100s.  He reported that his shortness of breath was better than when he was here but he still does not feel his normal self.  Feel that it is reasonable to allow patient to go home start taking his Eliquis increase his carvedilol to 1-1/2 tabs twice a day and follow-up with Dr. Elease Hashimoto on Friday or Monday.  CHA2DS2/VAS Stroke Risk Points      N/A >= 2 Points: High Risk  1 - 1.99 Points: Medium Risk  0 Points: Low Risk    A final score could not be computed because of missing components.: Last  Change: N/A     This score determines the patient's risk of having a stroke if the  patient has atrial fibrillation.      This score is not applicable to this patient. Components are not  calculated.     Final Clinical Impression(s) / ED Diagnoses Final diagnoses:  Atrial fibrillation with RVR [facility])    Rx / DC Orders ED Discharge Orders    None       Gwyneth Sprout, MD 10/06/19 1149

## 2019-10-06 NOTE — Telephone Encounter (Signed)
Just an FYI. We have made several attempts to contact this patient including sending a letter to schedule or reschedule their echocardiogram. We will be removing the patient from the echo WQ.  09/22/19 Mailed Letter/[facility]W  09/20/19 Mailbox full unable to LVM,sent SMS message/LBW @10 :42am  09/17/19 Mailbox full unable to LVM @ 3:39/LBW  09/15/19 Mailbox full 10:34am [facility]         Thank you

## 2019-10-08 ENCOUNTER — Telehealth: Payer: Self-pay | Admitting: Internal Medicine

## 2019-10-08 NOTE — Telephone Encounter (Signed)
New message     Wife is calling for patient.  Patient was seen in the ER recently and was told he was in AFIB and needed to be seen today or Monday.  An appointment was made for April 15.  I was able to make him a sooner appt with Dr Graciela Husbands for Tuesday.  Patient also has a defibulator.  Since "they" said be seen today or Monday, wife want to talk to a nurse to make sure it is ok to wait until Tuesday.  Please call

## 2019-10-08 NOTE — Telephone Encounter (Signed)
Spoke with pt and offered appointment with Luster Landsberg, EP APP for Monday, October 11, 2019 at 1015am.  Pt states he is feeling much better and will keep his appointment scheduled with Dr Graciela Husbands on 10/12/2019.  Pt advised if he develops CP or SOB will need to return to ED for evaluation.  Pt verbalizes understanding and agrees with current plan.

## 2019-10-11 ENCOUNTER — Ambulatory Visit: Payer: BLUE CROSS/BLUE SHIELD | Admitting: Physician Assistant

## 2019-10-11 DIAGNOSIS — Z95810 Presence of automatic (implantable) cardiac defibrillator: Secondary | ICD-10-CM | POA: Insufficient documentation

## 2019-10-12 ENCOUNTER — Ambulatory Visit (INDEPENDENT_AMBULATORY_CARE_PROVIDER_SITE_OTHER): Payer: BLUE CROSS/BLUE SHIELD | Admitting: Internal Medicine

## 2019-10-12 ENCOUNTER — Encounter: Payer: Self-pay | Admitting: Internal Medicine

## 2019-10-12 ENCOUNTER — Other Ambulatory Visit: Payer: Self-pay

## 2019-10-12 VITALS — BP 130/82 | HR 79 | Ht 75.0 in | Wt 266.0 lb

## 2019-10-12 DIAGNOSIS — I428 Other cardiomyopathies: Secondary | ICD-10-CM | POA: Diagnosis not present

## 2019-10-12 DIAGNOSIS — R0683 Snoring: Secondary | ICD-10-CM

## 2019-10-12 DIAGNOSIS — Z95810 Presence of automatic (implantable) cardiac defibrillator: Secondary | ICD-10-CM | POA: Diagnosis not present

## 2019-10-12 DIAGNOSIS — I4891 Unspecified atrial fibrillation: Secondary | ICD-10-CM

## 2019-10-12 DIAGNOSIS — I5022 Chronic systolic (congestive) heart failure: Secondary | ICD-10-CM

## 2019-10-12 MED ORDER — CARVEDILOL 25 MG PO TABS: 25.0000 mg | ORAL_TABLET | Freq: Two times a day (BID) | ORAL | 3 refills | Status: DC

## 2019-10-12 NOTE — Progress Notes (Signed)
Patient Care Team: Nahser, Deloris Ping, MD as Attending Physician (Cardiology)   HPI  Nathan Baird is a 39 y.o. male Seen in follow-up for ICD implanted for primary prevention in the setting of nonischemic cardiomyopathy since [name] dysfunction and modest class II heart failure.  He has a history of a prior TIA currently is treated with aspirin.  Interval device interrogation has demonstrated atrial fibrillation.  Was quite symptomatic.  He has had 2 intervening episodes of atrial fibrillation both of which were quite symptomatic.  No chest pain shortness of breath or edema Date Cr K  Hgb   Mg  6/18  1.15 4.0      8/19 1.21 3.7    3/21 1.03 4.2 17.4 2.2   DATE TEST EF   8/14 cMRI  26  %   4/16 Echo   25-30 %   5/18 Echo  20-25%      Thromboembolic risk factors ( , HTN-1, TIA/CVA-2, CHF-1) for a CHADSVASc Score of 4    Past Medical History:  Diagnosis Date  . CHF (congestive heart failure) (HCC)   . Cough    a. PFTS 09/2012: restriction probable, further examinations recommended. Saw pulm 11/2012: placed on GERD rx and short course prednisone, planned to regroup.  . CVA (cerebral vascular accident) (HCC) 01/23/2013   tPA administered; MRI brain showed small acute R MCA infarct  . History of palpitations    2x/month for several years feels heart racing  . Nonischemic cardiomyopathy (HCC)    a. Identified 10/2012 by echo EF 20-25%. b. Echo 01/2013: EF 20-25%, TEE EF 15% with severe RV dysfunction as well.  [name] PFO (patent foramen ovale)    a. By TEE 01/2013.    Past Surgical History:  Procedure Laterality Date  . NO PAST SURGERIES    . SUBQ ICD IMPLANT N/A 01/10/2017   Procedure: SubQ ICD Implant;  Surgeon: Duke Salvia, MD;  Location: [facility] INVASIVE CV LAB;  Service: Cardiovascular;  Laterality: N/A;  . TEE WITHOUT CARDIOVERSION N/A 01/26/2013   Procedure: TRANSESOPHAGEAL ECHOCARDIOGRAM (TEE);  Surgeon: [name], MD;  Location: [facility] ENDOSCOPY;  Service:  Cardiovascular;  Laterality: N/A;    Current Outpatient Medications  Medication Sig Dispense Refill  . apixaban (ELIQUIS) 5 MG TABS tablet Take 1 tablet (5 mg total) by mouth 2 (two) times daily. 60 tablet 11  . atorvastatin (LIPITOR) 10 MG tablet Take 1 tablet (10 mg total) by mouth daily at 6 PM. 90 tablet 3  . carvedilol (COREG) 12.5 MG tablet Take 1 tablet (12.5 mg total) by mouth 2 (two) times daily with a meal. 180 tablet 3  . sacubitril-valsartan (ENTRESTO) 49-51 MG Take 1 tablet by mouth 2 (two) times daily. 180 tablet 3  . spironolactone (ALDACTONE) 25 MG tablet Take 1 tablet (25 mg total) by mouth daily. 90 tablet 3   No current facility-administered medications for this visit.    No Known Allergies    Review of Systems negative except from HPI and PMH  Physical Exam BP 130/82   Pulse 79   Ht 6\' 3"  (1.905 m)   Wt 266 lb (120.7 kg)   SpO2 98%   BMI 33.25 kg/m   Well developed and well nourished in no acute distress HENT normal Neck supple with JVP-flat Clear Device pocket well healed; without hematoma or erythema.  There is no tethering  Regular rate and rhythm, no murmur Abd-soft with active BS No Clubbing cyanosis  dema Skin-warm and dry A & Oriented  Grossly normal sensory and motor function  ECG  Sinus at 79 Normal 16/09/39 Nonspecific ST-T changes    Assessment and  Plan  NICM  SubQ ICD The patient's device was interrogated.  The information was reviewed. No changes were made in the programming.     AFib  TIA  Class I recall-generator and lead  The patient's SICD leadis subject to a grade one recall because of risk of fracture of the lead just distal to the proximal electrode o  resulting in potential for inappropriate shocks and an inability to deliver an appropriate shock.  We have discussed the risk of this event and given the patients status as primary   we have elected to continue to monitor and not   replace the lead at this time.  We will  plan to see him at 79-month intervals in the office with manipulation and the alternate vector to see if we can uncover interval the issues  We have discussed the benefits and risks of programming sensing to include or exclude the distal electrode.  In its exclusion, there would be no warnings from inappropriate electrograms but there is no risk of inappropriate shocks.  Inclusion results in the opposite.   We have reviewed the importance of regular interrogation of the device so as to have some opportunity for alerts, have demonstrated the beep tones and stressed the importance of [place] checking BEEP tones following MRI.   The patient's SICD generator is subject to a grade one recall because of risk of stress on the system resulting in potential inability to deliver all or any of an appropriate shock.  We have discussed the risk of this event and given the patients status as primary   we have elected to continue to monitor and not snoring replace the generator at this time  We have reviewed the importance of regular interrogation of the device so as to have some opportunity for alerts, have demonstrated the beep tones and stressed the importance of [place] checking BEEP tones following MRI    Interval atrial fibrillation.  Quite symptomatic.  We will check his echo to look at left atrial size and a sleep study has as he has sleep disordered breathing.  In the event that these are permissive, we will plan referral to Dr. Greggory Brandy for consideration of catheter ablation in this young man with paroxysmal symptomatic atrial fibrillation  For now we will continue Eliquis

## 2019-10-12 NOTE — Patient Instructions (Addendum)
Medication Instructions:  Your physician has recommended you make the following change in your medication:   Increase your Carvedilol to 25mg  - 1 tablet by mouth twice daily with a meal.   Labwork: None ordered.  Testing/Procedures: Your physician has requested that you have an echocardiogram. Echocardiography is a painless test that uses sound waves to create images of your heart. It provides your doctor with information about the size and shape of your heart and how well your heart's chambers and valves are working. This procedure takes approximately one hour. There are no restrictions for this procedure.  Your physician has recommended that you have a sleep study. This test records several body functions during sleep, including: brain activity, eye movement, oxygen and carbon dioxide blood levels, heart rate and rhythm, breathing rate and rhythm, the flow of air through your mouth and nose, snoring, body muscle movements, and chest and belly movement.   Follow-Up: Your physician wants you to follow-up in: 01/13/2020 at 10am with Device clinic.    Virtual Visit 12/13/2019 at 2pm with Dr 02/12/2020  Remote monitoring is used to monitor your Pacemaker of ICD from home. This monitoring reduces the number of office visits required to check your device to one time per year. It allows Nathan Baird to keep an eye on the functioning of your device to ensure it is working properly.  Any Other Special Instructions Will Be Listed Below (If Applicable).  If you need a refill on your cardiac medications before your next appointment, please call your pharmacy.

## 2019-10-14 ENCOUNTER — Telehealth: Payer: Self-pay | Admitting: *Deleted

## 2019-10-14 NOTE — Telephone Encounter (Signed)
-----   Message from Alois Cliche, RN sent at 10/12/2019  3:35 PM EDT ----- Regarding: Sleep Study Please precert and schedule sleep study for pt per Dr Graciela Husbands.  Thank you,  Mindi Junker

## 2019-10-18 ENCOUNTER — Ambulatory Visit: Payer: BLUE CROSS/BLUE SHIELD | Admitting: Cardiovascular Disease

## 2019-10-21 ENCOUNTER — Ambulatory Visit: Payer: BLUE CROSS/BLUE SHIELD | Admitting: Cardiovascular Disease

## 2019-11-01 ENCOUNTER — Other Ambulatory Visit (HOSPITAL_COMMUNITY): Payer: BLUE CROSS/BLUE SHIELD

## 2019-11-09 ENCOUNTER — Ambulatory Visit (INDEPENDENT_AMBULATORY_CARE_PROVIDER_SITE_OTHER): Payer: BLUE CROSS/BLUE SHIELD | Admitting: *Deleted

## 2019-11-09 DIAGNOSIS — I428 Other cardiomyopathies: Secondary | ICD-10-CM | POA: Diagnosis not present

## 2019-11-09 DIAGNOSIS — I5022 Chronic systolic (congestive) heart failure: Secondary | ICD-10-CM

## 2019-11-10 LAB — CUP PACEART REMOTE DEVICE CHECK
Battery Remaining Percentage: 69 %
Date Time Interrogation Session: 20210504223200
Implantable Lead Implant Date: 20180706
Implantable Lead Location: 753862
Implantable Lead Model: 3401
Implantable Lead Serial Number: 114025
Implantable Pulse Generator Implant Date: 20180706
Pulse Gen Serial Number: 228369

## 2019-11-10 NOTE — Progress Notes (Signed)
Remote ICD transmission.   

## 2019-11-19 ENCOUNTER — Ambulatory Visit (HOSPITAL_COMMUNITY): Payer: BLUE CROSS/BLUE SHIELD | Attending: Cardiovascular Disease

## 2019-11-19 ENCOUNTER — Other Ambulatory Visit: Payer: Self-pay

## 2019-11-19 DIAGNOSIS — I4891 Unspecified atrial fibrillation: Secondary | ICD-10-CM | POA: Insufficient documentation

## 2019-11-19 MED ORDER — PERFLUTREN LIPID MICROSPHERE
1.0000 mL | INTRAVENOUS | Status: AC | PRN
  Administered 2019-11-19: 2 mL via INTRAVENOUS

## 2019-11-24 ENCOUNTER — Encounter: Payer: Self-pay | Admitting: Emergency Medicine

## 2019-11-24 ENCOUNTER — Other Ambulatory Visit: Payer: Self-pay

## 2019-11-24 ENCOUNTER — Ambulatory Visit
Admission: EM | Admit: 2019-11-24 | Discharge: 2019-11-24 | Disposition: A | Discharge status: Home / Self Care | Payer: BLUE CROSS/BLUE SHIELD | Attending: Physician Assistant | Admitting: Physician Assistant

## 2019-11-24 DIAGNOSIS — R300 Dysuria: Secondary | ICD-10-CM

## 2019-11-24 MED ORDER — DOXYCYCLINE HYCLATE 100 MG PO CAPS: 100.0000 mg | ORAL_CAPSULE | Freq: Two times a day (BID) | ORAL | 0 refills | Status: DC

## 2019-11-24 MED ORDER — CEFTRIAXONE SODIUM 500 MG IJ SOLR
500.0000 mg | Freq: Once | INTRAMUSCULAR | Status: AC
  Administered 2019-11-24: 500 mg via INTRAMUSCULAR

## 2019-11-24 NOTE — ED Triage Notes (Signed)
Patient has noticed urinary symptoms for 2 days.  Reports penis has an "itchy feeling".  Denies discharge

## 2019-11-24 NOTE — ED Provider Notes (Signed)
[name]    CSN: [id number] Arrival date & time: 11/24/19  1746      History   Chief Complaint Chief Complaint  Patient presents with  . Urinary Frequency    HPI Nathan Baird is a 39 y.o. male.   39 year old male with history of CVA, CHF, A. fib on Eliquis comes in with 1 week history of occasional dysuria, 2-week history of itching to the tip of the penis.  Denies urinary frequency, hematuria.  Denies abdominal pain, nausea, vomiting, diarrhea.  Denies fever, chills, body aches.  Denies penile discharge, penile lesion, testicular swelling, testicular pain.  Sexually active, states has 1 new male partner, no condom use.     Past Medical History:  Diagnosis Date  . CHF (congestive heart failure) (HCC)   . Cough    a. PFTS 09/2012: restriction probable, further examinations recommended. Saw pulm 11/2012: placed on GERD rx and short course prednisone, planned to regroup.  . CVA (cerebral vascular accident) (HCC) 01/23/2013   tPA administered; MRI brain showed small acute R MCA infarct  . History of palpitations    2x/month for several years feels heart racing  . Nonischemic cardiomyopathy (HCC)    a. Identified 10/2012 by echo EF 20-25%. b. Echo 01/2013: EF 20-25%, TEE EF 15% with severe RV dysfunction as well.  [name] PFO (patent foramen ovale)    a. By TEE 01/2013.    Patient Active Problem List   Diagnosis Date Noted  . ICD (implantable cardioverter-defibrillator) in place 10/11/2019  . Chest pain 02/19/2018  . NICM (nonischemic cardiomyopathy) (HCC) 01/10/2017  . Trapezius muscle strain 11/24/2014  . Right shoulder pain 08/24/2014  . TIA (transient ischemic attack) 05/20/2014  . History of CVA (cerebrovascular accident)   . Essential hypertension   . HLD (hyperlipidemia)   . Encounter for long-term (current) use of high-risk medication 01/27/2013  . Patent foramen ovale with right to left shunt 01/27/2013  . Stroke, acute, embolic (HCC) 01/25/2013  .  Cough 10/20/2012  . Chronic systolic congestive heart failure (HCC) 10/20/2012  . Penile pain 05/19/2012  . ATTENTION DEFICIT, W/HYPERACTIVITY 09/04/2006    Past Surgical History:  Procedure Laterality Date  . NO PAST SURGERIES    . SUBQ ICD IMPLANT N/A 01/10/2017   Procedure: SubQ ICD Implant;  Surgeon: Duke Salvia, MD;  Location: [facility] INVASIVE CV LAB;  Service: Cardiovascular;  Laterality: N/A;  . TEE WITHOUT CARDIOVERSION N/A 01/26/2013   Procedure: TRANSESOPHAGEAL ECHOCARDIOGRAM (TEE);  Surgeon: [name], MD;  Location: [facility] ENDOSCOPY;  Service: Cardiovascular;  Laterality: N/A;       Home Medications    Prior to Admission medications   Medication Sig Start Date End Date Taking? Authorizing Provider  apixaban (ELIQUIS) 5 MG TABS tablet Take 1 tablet (5 mg total) by mouth 2 (two) times daily. 07/02/18   Duke Salvia, MD  atorvastatin (LIPITOR) 10 MG tablet Take 1 tablet (10 mg total) by mouth daily at 6 PM. 10/24/16   Nahser, Deloris Ping, MD  carvedilol (COREG) 25 MG tablet Take 1 tablet (25 mg total) by mouth 2 (two) times daily with a meal. 10/12/19   Duke Salvia, MD  doxycycline (VIBRAMYCIN) 100 MG capsule Take 1 capsule (100 mg total) by mouth 2 (two) times daily. 11/24/19   Cathie Hoops, Yoceline Bazar V, PA-C  sacubitril-valsartan (ENTRESTO) 49-51 MG Take 1 tablet by mouth 2 (two) times daily. 09/14/19   Nahser, Deloris Ping, MD  spironolactone (ALDACTONE) 25 MG tablet Take  1 tablet (25 mg total) by mouth daily. 09/14/19   Nahser, Deloris Ping, MD    Family History Family History  Problem Relation Age of Onset  . Sarcoidosis Mother   . Heart Problems Mother        PPM implantation age 89    Social History Social History   Tobacco Use  . Smoking status: Never Smoker  . Smokeless tobacco: Never Used  Substance Use Topics  . Alcohol use: Yes    Comment: occ  . Drug use: No     Allergies   Patient has no known allergies.   Review of Systems Review of Systems  Reason unable to  perform ROS: See HPI as above.     Physical Exam Triage Vital Signs ED Triage Vitals  Enc Vitals Group     BP 11/24/19 1810 119/82     Pulse Rate 11/24/19 1810 79     Resp 11/24/19 1810 20     Temp 11/24/19 1810 98.6 F (37 C)     Temp Source 11/24/19 1810 Oral     SpO2 11/24/19 1810 95 %     Weight --      Height --      Head Circumference --      Peak Flow --      Pain Score 11/24/19 1808 5     Pain Loc --      Pain Edu? --      Excl. in GC? --    No data found.  Updated Vital Signs BP 119/82 (BP Location: Left Arm)   Pulse 79   Temp 98.6 F (37 C) (Oral)   Resp 20   SpO2 95%   Visual Acuity Right Eye Distance:   Left Eye Distance:   Bilateral Distance:    Right Eye Near:   Left Eye Near:    Bilateral Near:     Physical Exam Exam conducted with a chaperone present.  Constitutional:      General: He is not in acute distress.    Appearance: Normal appearance. He is well-developed. He is not toxic-appearing or diaphoretic.  HENT:     Head: Normocephalic and atraumatic.  Eyes:     Conjunctiva/sclera: Conjunctivae normal.     Pupils: Pupils are equal, round, and reactive to light.  Pulmonary:     Effort: Pulmonary effort is normal. No respiratory distress.     Comments: Speaking in full sentences without difficulty Abdominal:     Hernia: There is no hernia in the left inguinal area or right inguinal area.  Genitourinary:    Penis: Normal and circumcised.      Testes: Normal.        Right: Tenderness not present.        Left: Tenderness not present.     Epididymis:     Right: Normal.     Left: Normal.     Comments: No erythema, tenderness, discharge to the urethral opening Musculoskeletal:     Cervical back: Normal range of motion and neck supple.  Skin:    General: Skin is warm and dry.  Neurological:     Mental Status: He is alert and oriented to person, place, and time.      UC Treatments / Results  Labs (all labs ordered are listed, but  only abnormal results are displayed) Labs Reviewed  CYTOLOGY, (ORAL, ANAL, URETHRAL) ANCILLARY ONLY    EKG   Radiology No results found.  Procedures Procedures (including critical care time)  Medications Ordered in UC Medications  cefTRIAXone (ROCEPHIN) injection 500 mg (500 mg Intramuscular Given 11/24/19 1850)    Initial Impression / Assessment and Plan / UC Course  I have reviewed the triage vital signs and the nursing notes.  Pertinent labs & imaging results that were available during my care of the patient were reviewed by me and considered in my medical decision making (see chart for details).    Patient was treated empirically for GC. Rocephin given in office today. Start doxycycline as directed. Cytology sent, patient will be contacted with any positive results that require additional treatment. Patient to refrain from sexual activity for the next 7 days. Return precautions given.   Final Clinical Impressions(s) / UC Diagnoses   Final diagnoses:  Dysuria   ED Prescriptions    Medication Sig Dispense Auth. Provider   doxycycline (VIBRAMYCIN) 100 MG capsule Take 1 capsule (100 mg total) by mouth 2 (two) times daily. 14 capsule Ok Edwards, PA-C     PDMP not reviewed this encounter.   Ok Edwards, PA-C 11/24/19 1955

## 2019-11-24 NOTE — Discharge Instructions (Signed)
You were treated empirically for gonorrhea and chlamydia. Rocephin 500mg injection given in office today. Start doxycycline as directed. Cytology sent, you will be contacted with any positive results that requires further treatment. Refrain from sexual activity and alcohol use for the next 7 days.  If developing testicular swelling/pain, penile lesion/sore, follow up for reevaluation.   

## 2019-11-26 LAB — CYTOLOGY, (ORAL, ANAL, URETHRAL) ANCILLARY ONLY
Chlamydia: NEGATIVE
Comment: NEGATIVE
Comment: NEGATIVE
Comment: NORMAL
Neisseria Gonorrhea: NEGATIVE
Trichomonas: NEGATIVE

## 2019-12-01 ENCOUNTER — Telehealth: Payer: Self-pay | Admitting: *Deleted

## 2019-12-01 DIAGNOSIS — I10 Essential (primary) hypertension: Secondary | ICD-10-CM

## 2019-12-01 DIAGNOSIS — R0683 Snoring: Secondary | ICD-10-CM

## 2019-12-01 NOTE — Telephone Encounter (Signed)
-----   Message from Bertram Gala sent at 11/17/2019  2:40 PM EDT ----- Regarding: RE: Sleep Study Sleep study request has been denied.   Bertram Gala ----- Message ----- From: Bertram Gala Sent: 10/26/2019   1:54 PM EDT To: Reesa Chew, CMA, Alois Cliche, RN, # Subject: RE: Sleep Study                                Good Afternoon,  Clinical Authorization Review Required: Notes faxed to [facility] at [phone]  Thank you, Bertram Gala ----- Message ----- From: Alois Cliche, RN Sent: 10/12/2019   3:35 PM EDT To: Reesa Chew, CMA, Cv Div Sleep Studies Subject: Sleep Study                                    Please precert and schedule sleep study for pt per Dr Graciela Husbands.  Thank you,  Mindi Junker

## 2019-12-01 NOTE — Telephone Encounter (Signed)
Meyer Russel, RN; P Cv Div Sleep Studies; Reesa Chew, CMA  Good Afternoon,   Clinical Authorization Review Required: Notes faxed to [employer] at [phone]   Thank you,  Bertram Gala

## 2019-12-10 ENCOUNTER — Telehealth: Payer: Self-pay

## 2019-12-10 NOTE — Telephone Encounter (Signed)
Left a message regarding virtual appt on 12/13/19. 

## 2019-12-13 ENCOUNTER — Other Ambulatory Visit: Payer: Self-pay

## 2019-12-13 ENCOUNTER — Telehealth (HOSPITAL_COMMUNITY): Payer: Self-pay | Admitting: Physician Assistant

## 2019-12-13 ENCOUNTER — Telehealth (INDEPENDENT_AMBULATORY_CARE_PROVIDER_SITE_OTHER): Payer: BLUE CROSS/BLUE SHIELD | Admitting: Internal Medicine

## 2019-12-13 ENCOUNTER — Encounter: Payer: Self-pay | Admitting: Internal Medicine

## 2019-12-13 DIAGNOSIS — I480 Paroxysmal atrial fibrillation: Secondary | ICD-10-CM

## 2019-12-13 DIAGNOSIS — I428 Other cardiomyopathies: Secondary | ICD-10-CM | POA: Diagnosis not present

## 2019-12-13 DIAGNOSIS — I5022 Chronic systolic (congestive) heart failure: Secondary | ICD-10-CM

## 2019-12-13 DIAGNOSIS — R0683 Snoring: Secondary | ICD-10-CM

## 2019-12-13 NOTE — Telephone Encounter (Signed)
Called and left message for patient to call back to make 6 wk f/u appt per Dr. Johney Frame

## 2019-12-13 NOTE — Progress Notes (Signed)
Electrophysiology TeleHealth Note  Due to national recommendations of social distancing due to [place] 19, an audio telehealth visit is felt to be most appropriate for this patient at this time.  Verbal consent was obtained by me for the telehealth visit today.  The patient does not have capability for a virtual visit.  A phone visit is therefore required today.   Date:  12/13/2019   ID:  Nathan Baird, DOB April 12, 1981, MRN [id number]  Location: patient's home  Provider location:  [place]  Evaluation Performed: New patient visit   PCP:  Patient, No Pcp Per   Electrophysiologist:  Dr Caryl Comes  Chief Complaint:  AF evaluation   History of Present Illness:    Nathan Baird is a 39 y.o. male who presents via telehealth conferencing today. He is referred by Dr Caryl Comes for evaluation of AF. He has a history of NICM and is s/p BSX S-ICD.  He has had 2 episodes of atrial fibrillation which have been very symptomatic.   Today, he denies symptoms of palpitations, chest pain, shortness of breath,  lower extremity edema, dizziness, presyncope, or syncope.  The patient is otherwise without complaint today.  Echo 11/2019 demonstrated EF 35-40%, LA 46  Past Medical History:  Diagnosis Date   CHF (congestive heart failure) (HCC)    Cough    a. PFTS 09/2012: restriction probable, further examinations recommended. Saw pulm 11/2012: placed on GERD rx and short course prednisone, planned to regroup.   CVA (cerebral vascular accident) ([place]) 01/23/2013   tPA administered; MRI brain showed small acute R MCA infarct   History of palpitations    2x/month for several years feels heart racing   Nonischemic cardiomyopathy ([place])    a. Identified 10/2012 by echo EF 20-25%. b. Echo 01/2013: EF 20-25%, TEE EF 15% with severe RV dysfunction as well.   PFO (patent foramen ovale)    a. By TEE 01/2013.    Past Surgical History:  Procedure Laterality Date   NO PAST SURGERIES     SUBQ ICD IMPLANT N/A  01/10/2017   Procedure: SubQ ICD Implant;  Surgeon: Deboraha Sprang, MD;  Location: [place] CV LAB;  Service: Cardiovascular;  Laterality: N/A;   TEE WITHOUT CARDIOVERSION N/A 01/26/2013   Procedure: TRANSESOPHAGEAL ECHOCARDIOGRAM (TEE);  Surgeon: Lelon Perla, MD;  Location: [facility] ENDOSCOPY;  Service: Cardiovascular;  Laterality: N/A;    Current Outpatient Medications  Medication Sig Dispense Refill   apixaban (ELIQUIS) 5 MG TABS tablet Take 1 tablet (5 mg total) by mouth 2 (two) times daily. 60 tablet 11   atorvastatin (LIPITOR) 10 MG tablet Take 1 tablet (10 mg total) by mouth daily at 6 PM. 90 tablet 3   carvedilol (COREG) 25 MG tablet Take 1 tablet (25 mg total) by mouth 2 (two) times daily with a meal. 180 tablet 3   doxycycline (VIBRAMYCIN) 100 MG capsule Take 1 capsule (100 mg total) by mouth 2 (two) times daily. 14 capsule 0   sacubitril-valsartan (ENTRESTO) 49-51 MG Take 1 tablet by mouth 2 (two) times daily. 180 tablet 3   spironolactone (ALDACTONE) 25 MG tablet Take 1 tablet (25 mg total) by mouth daily. 90 tablet 3   No current facility-administered medications for this visit.    Allergies:   Patient has no known allergies.   Social History:  The patient  reports that he has never smoked. He has never used smokeless tobacco. He reports current alcohol use. He reports that he does  not use drugs.   ROS:  Please see the history of present illness.   All other systems are personally reviewed and negative.    Exam:    Vital Signs:  There were no vitals taken for this visit.  Well sounding, alert and conversant   Labs/Other Tests and Data Reviewed:    Recent Labs: 10/06/2019: ALT 23; BUN 20; Creatinine, Ser 1.03; Hemoglobin 17.4; Magnesium 2.2; Platelets 346; Potassium 4.2; Sodium 143   Wt Readings from Last 3 Encounters:  10/12/19 266 lb (120.7 kg)  10/06/19 260 lb (117.9 kg)  09/14/19 264 lb 4 oz (119.9 kg)      ASSESSMENT & PLAN:    1.  Paroxysmal atrial  fibrillation The patient has symptomatic, recurrent paroxysmal atrial fibrillation. he has failed medical therapy with Coreg. Chads2vasc score is 3.  he is anticoagulated with Eliquis. Therapeutic strategies for afib including medicine and ablation were discussed in detail with the patient today. Risk, benefits, and alternatives to EP study and radiofrequency ablation for afib were also discussed in detail today. These risks include but are not limited to stroke, bleeding, vascular damage, tamponade, perforation, damage to the esophagus, lungs, and other structures, pulmonary vein stenosis, worsening renal function, and death. The patient at this time would like to continue to work on weight loss and sleep apnea  2.  NICM S/p S-ICD Followed by Dr Graciela Husbands  3.  Sleep disordered breathing His sleep study was denied by insurance, will ask Dr Odessa Fleming team to follow up for appeal  4.  Lifestyle modification  Weight loss recommended  He is making good progress with weight loss  Risks, benefits and potential toxicities for medications prescribed and/or refilled reviewed with patient today.   Follow-up:  With AF clinic in 6 weeks to continue working on lifestyle modification    Patient Risk:  after full review of this patients clinical status, I feel that they are at moderate risk at this time.  Today, I have spent 25 minutes with the patient with telehealth technology discussing arrhythmia management .    Randolm Idol, MD  12/13/2019 1:03 PM     [facility] [address] [place] [address] [phone] (office) [phone] (fax)

## 2019-12-17 ENCOUNTER — Other Ambulatory Visit: Payer: Self-pay | Admitting: Internal Medicine

## 2019-12-20 NOTE — Telephone Encounter (Signed)
Called and left 2nd message for patient to call [facility] to schedule appt per Dr. Johney Frame.

## 2019-12-24 NOTE — Telephone Encounter (Signed)
Patient has not called back.  Appt information and directions to AFib Clinic mailed to patient.  Also sent staff message to Dr. Johney Frame to make him aware.

## 2020-01-27 ENCOUNTER — Encounter (HOSPITAL_COMMUNITY): Payer: Self-pay

## 2020-01-27 ENCOUNTER — Ambulatory Visit (HOSPITAL_COMMUNITY): Payer: BLUE CROSS/BLUE SHIELD | Admitting: Physician Assistant

## 2020-01-27 NOTE — Telephone Encounter (Signed)
Regarding: RE: Sleep Study Sleep study request has been denied.   Will resubmit for a home sleep test.

## 2020-02-02 ENCOUNTER — Telehealth: Payer: Self-pay | Admitting: *Deleted

## 2020-02-02 NOTE — Telephone Encounter (Signed)
Staff message sent to [place] in lab study approved. Based on questions asked HST was denied. IDK  What happened with the previous PA was submitted. [employer] order # [id number]. Valid dates 02/02/20 to 07/31/20.

## 2020-02-07 LAB — CUP PACEART INCLINIC DEVICE CHECK
Date Time Interrogation Session: 20210406104059
Implantable Lead Implant Date: 20180706
Implantable Lead Location: 753862
Implantable Lead Model: 3401
Implantable Lead Serial Number: 114025
Implantable Pulse Generator Implant Date: 20180706
Pulse Gen Serial Number: 228369

## 2020-02-07 NOTE — Addendum Note (Signed)
Addended by: Reesa Chew on: 02/07/2020 04:58 PM   Modules accepted: Orders

## 2020-02-08 ENCOUNTER — Ambulatory Visit (INDEPENDENT_AMBULATORY_CARE_PROVIDER_SITE_OTHER): Payer: BLUE CROSS/BLUE SHIELD | Admitting: *Deleted

## 2020-02-08 DIAGNOSIS — I428 Other cardiomyopathies: Secondary | ICD-10-CM

## 2020-02-09 LAB — CUP PACEART REMOTE DEVICE CHECK
Battery Remaining Percentage: 66 %
Date Time Interrogation Session: 20210803205000
Implantable Lead Implant Date: 20180706
Implantable Lead Location: 753862
Implantable Lead Model: 3401
Implantable Lead Serial Number: 114025
Implantable Pulse Generator Implant Date: 20180706
Pulse Gen Serial Number: 228369

## 2020-02-10 NOTE — Progress Notes (Signed)
Remote ICD transmission.   

## 2020-02-11 ENCOUNTER — Telehealth: Payer: Self-pay | Admitting: *Deleted

## 2020-02-11 NOTE — Telephone Encounter (Signed)
Patient is aware and agreeable to Home Sleep Study through [facility]. Patient is scheduled for 03/23/20 at 11:30 to pick up home sleep kit and meet with Respiratory therapist at [facility]. Patient is aware that if this appointment date and time does not work for them they should contact Artis Delay directly at [phone]. Patient is aware that a sleep packet will be sent from [facility] in week. Left detailed message on voicemail with date and time of HST and informed patient to call back to confirm or reschedule.

## 2020-02-11 NOTE — Telephone Encounter (Signed)
-----   Message from Gaynelle Cage, [place] sent at 02/02/2020 12:03 PM EDT ----- Regarding: RE: Sleep Study [facility] idk who done the initial PA but I got the in lab approved. Based on his health conditions they denied the home sleep test. Schedule in lab.  Auth # [id number]. Dates 02/02/20 to 07/31/20. ----- Message ----- From: Reesa Chew, CMA Sent: 01/27/2020  10:17 AM EDT To: [facility] Subject: FW: Sleep Study                                 ----- Message ----- From: Reesa Chew, CMA Sent: 01/27/2020  10:15 AM EDT To: Bertram Gala Subject: RE: Sleep Study                                Try HST ----- Message ----- From: Bertram Gala Sent: 11/17/2019   2:40 PM EDT To: Bertram Gala, Reesa Chew, CMA, # Subject: RE: Sleep Study                                Sleep study request has been denied.   Bertram Gala ----- Message ----- From: Bertram Gala Sent: 10/26/2019   1:54 PM EDT To: Reesa Chew, CMA, Alois Cliche, RN, # Subject: RE: Sleep Study                                Good Afternoon,  Clinical Authorization Review Required: Notes faxed to [facility] at [phone]  Thank you, Bertram Gala ----- Message ----- From: Alois Cliche, RN Sent: 10/12/2019   3:35 PM EDT To: Reesa Chew, CMA, [facility] Subject: Sleep Study                                    Please precert and schedule sleep study for pt per Dr Graciela Husbands.  Thank you,  Mindi Junker

## 2020-03-23 ENCOUNTER — Encounter (HOSPITAL_BASED_OUTPATIENT_CLINIC_OR_DEPARTMENT_OTHER): Payer: BLUE CROSS/BLUE SHIELD | Admitting: Cardiology

## 2020-05-09 ENCOUNTER — Ambulatory Visit (INDEPENDENT_AMBULATORY_CARE_PROVIDER_SITE_OTHER): Payer: BLUE CROSS/BLUE SHIELD

## 2020-05-09 DIAGNOSIS — I5022 Chronic systolic (congestive) heart failure: Secondary | ICD-10-CM

## 2020-05-09 DIAGNOSIS — I428 Other cardiomyopathies: Secondary | ICD-10-CM | POA: Diagnosis not present

## 2020-05-10 LAB — CUP PACEART REMOTE DEVICE CHECK
Battery Remaining Percentage: 63 %
Date Time Interrogation Session: 20211102235700
Implantable Lead Implant Date: 20180706
Implantable Lead Location: 753862
Implantable Lead Model: 3401
Implantable Lead Serial Number: 114025
Implantable Pulse Generator Implant Date: 20180706
Pulse Gen Serial Number: 228369

## 2020-05-11 NOTE — Progress Notes (Signed)
Remote ICD transmission.   

## 2020-05-26 ENCOUNTER — Telehealth: Payer: Self-pay | Admitting: *Deleted

## 2020-05-26 NOTE — Telephone Encounter (Signed)
-----   Message from Pollyann Kennedy sent at 05/26/2020  3:14 PM EST ----- Regarding: RE: Insurance denial From the notes it looks like the patient was denied twice for a split night.  A HST was scheduled for September 16 and the patient cancelled.  Notes say he was going to call back and reschedule for 2nd week of October.  That never happened. ----- Message ----- From: Reesa Chew, CMA Sent: 05/25/2020   8:45 PM EST To: Wonda Horner Subject: FW: Insurance denial                           Help!! ----- Message ----- From: Alois Cliche, RN Sent: 05/02/2020   8:08 AM EST To: Reesa Chew, CMA Subject: FW: Insurance denial                           Did we ever anything from his appeal??  Thanks,  Mindi Junker ----- Message ----- From: Reesa Chew, CMA Sent: 01/17/2020   3:39 PM EDT To: Duke Salvia, MD, Alois Cliche, RN Subject: Insurance denial                               As of 12/02/19 insurance denied coverage of sleep study. How would you like to proceed? Appeal was started but no response as of yet.  Thanks, [facility]

## 2020-06-19 ENCOUNTER — Other Ambulatory Visit: Payer: Self-pay

## 2020-06-19 ENCOUNTER — Encounter: Payer: Self-pay | Admitting: Emergency Medicine

## 2020-06-19 ENCOUNTER — Ambulatory Visit
Admission: EM | Admit: 2020-06-19 | Discharge: 2020-06-19 | Disposition: A | Discharge status: Home / Self Care | Payer: BLUE CROSS/BLUE SHIELD | Attending: Emergency Medicine | Admitting: Emergency Medicine

## 2020-06-19 DIAGNOSIS — Z7251 High risk heterosexual behavior: Secondary | ICD-10-CM | POA: Insufficient documentation

## 2020-06-19 DIAGNOSIS — Z113 Encounter for screening for infections with a predominantly sexual mode of transmission: Secondary | ICD-10-CM | POA: Diagnosis present

## 2020-06-19 NOTE — Discharge Instructions (Addendum)

## 2020-06-19 NOTE — ED Provider Notes (Signed)
[name]    CSN: [id number] Arrival date & time: 06/19/20  1654      History   Chief Complaint Chief Complaint  Patient presents with  . Exposure to STD    HPI Nathan Baird is a 39 y.o. male  With history as below presenting for STI testing.  Currently sexually active with 1 male partner: No known contacts.  Patient denies symptoms such as discharge, dysuria.  Past Medical History:  Diagnosis Date  . CHF (congestive heart failure) (HCC)   . Cough    a. PFTS 09/2012: restriction probable, further examinations recommended. Saw pulm 11/2012: placed on GERD rx and short course prednisone, planned to regroup.  . CVA (cerebral vascular accident) (HCC) 01/23/2013   tPA administered; MRI brain showed small acute R MCA infarct  . Nonischemic cardiomyopathy (HCC)    a. Identified 10/2012 by echo EF 20-25%. b. Echo 01/2013: EF 20-25%, TEE EF 15% with severe RV dysfunction as well.  . Paroxysmal atrial fibrillation (HCC)   . PFO (patent foramen ovale)    a. By TEE 01/2013.    Patient Active Problem List   Diagnosis Date Noted  . ICD (implantable cardioverter-defibrillator) in place 10/11/2019  . Chest pain 02/19/2018  . NICM (nonischemic cardiomyopathy) (HCC) 01/10/2017  . Trapezius muscle strain 11/24/2014  . Right shoulder pain 08/24/2014  . TIA (transient ischemic attack) 05/20/2014  . History of CVA (cerebrovascular accident)   . Essential hypertension   . HLD (hyperlipidemia)   . Encounter for long-term (current) use of high-risk medication 01/27/2013  . Patent foramen ovale with right to left shunt 01/27/2013  . Stroke, acute, embolic (HCC) 01/25/2013  . Cough 10/20/2012  . Chronic systolic congestive heart failure (HCC) 10/20/2012  . Penile pain 05/19/2012  . ATTENTION DEFICIT, W/HYPERACTIVITY 09/04/2006    Past Surgical History:  Procedure Laterality Date  . NO PAST SURGERIES    . SUBQ ICD IMPLANT N/A 01/10/2017   Procedure: SubQ ICD Implant;   Surgeon: Duke Salvia, MD;  Location: [facility] INVASIVE CV LAB;  Service: Cardiovascular;  Laterality: N/A;  . TEE WITHOUT CARDIOVERSION N/A 01/26/2013   Procedure: TRANSESOPHAGEAL ECHOCARDIOGRAM (TEE);  Surgeon: [name], MD;  Location: [facility] ENDOSCOPY;  Service: Cardiovascular;  Laterality: N/A;       Home Medications    Prior to Admission medications   Medication Sig Start Date End Date Taking? Authorizing Provider  carvedilol (COREG) 25 MG tablet Take 1 tablet (25 mg total) by mouth 2 (two) times daily with a meal. 10/12/19  Yes Duke Salvia, MD  sacubitril-valsartan (ENTRESTO) 49-51 MG Take 1 tablet by mouth 2 (two) times daily. 09/14/19  Yes Nahser, Deloris Ping, MD  apixaban (ELIQUIS) 5 MG TABS tablet Take 1 tablet (5 mg total) by mouth 2 (two) times daily. 07/02/18   Duke Salvia, MD  atorvastatin (LIPITOR) 10 MG tablet Take 1 tablet (10 mg total) by mouth daily at 6 PM. 10/24/16   Nahser, Deloris Ping, MD  doxycycline (VIBRAMYCIN) 100 MG capsule Take 1 capsule (100 mg total) by mouth 2 (two) times daily. 11/24/19   Belinda Fisher, PA-C  spironolactone (ALDACTONE) 25 MG tablet Take 1 tablet (25 mg total) by mouth daily. 09/14/19   Nahser, Deloris Ping, MD    Family History Family History  Problem Relation Age of Onset  . Sarcoidosis Mother   . Heart Problems Mother        PPM implantation age 9    Social History  Social History   Tobacco Use  . Smoking status: Never Smoker  . Smokeless tobacco: Never Used  Vaping Use  . Vaping Use: Never used  Substance Use Topics  . Alcohol use: Yes    Comment: occ  . Drug use: No     Allergies   Patient has no known allergies.   Review of Systems Review of Systems  Constitutional: Negative for fatigue and fever.  Gastrointestinal: Negative for abdominal pain and nausea.  Genitourinary: Negative for dysuria, frequency, genital sores, penile discharge, penile pain, penile swelling, scrotal swelling, testicular pain and urgency.   Musculoskeletal: Negative for arthralgias and myalgias.  Skin: Negative for color change and rash.     Physical Exam Triage Vital Signs ED Triage Vitals  Enc Vitals Group     BP 06/19/20 1923 124/85     Pulse Rate 06/19/20 1923 85     Resp 06/19/20 1923 17     Temp 06/19/20 1923 98.2 F (36.8 C)     Temp Source 06/19/20 1923 Oral     SpO2 06/19/20 1923 96 %     Weight --      Height --      Head Circumference --      Peak Flow --      Pain Score 06/19/20 1921 0     Pain Loc --      Pain Edu? --      Excl. in GC? --    No data found.  Updated Vital Signs BP 124/85 (BP Location: Left Arm)   Pulse 85   Temp 98.2 F (36.8 C) (Oral)   Resp 17   SpO2 96%   Visual Acuity Right Eye Distance:   Left Eye Distance:   Bilateral Distance:    Right Eye Near:   Left Eye Near:    Bilateral Near:     Physical Exam Constitutional:      General: He is not in acute distress. HENT:     Head: Normocephalic and atraumatic.  Eyes:     General: No scleral icterus.    Pupils: Pupils are equal, round, and reactive to light.  Cardiovascular:     Rate and Rhythm: Normal rate.  Pulmonary:     Effort: Pulmonary effort is normal. No respiratory distress.     Breath sounds: No wheezing.  Skin:    Coloration: Skin is not jaundiced or pale.  Neurological:     Mental Status: He is alert and oriented to person, place, and time.      UC Treatments / Results  Labs (all labs ordered are listed, but only abnormal results are displayed) Labs Reviewed - No data to display  EKG   Radiology No results found.  Procedures Procedures (including critical care time)  Medications Ordered in UC Medications - No data to display  Initial Impression / Assessment and Plan / UC Course  I have reviewed the triage vital signs and the nursing notes.  Pertinent labs & imaging results that were available during my care of the patient were reviewed by me and considered in my medical decision  making (see chart for details).     Cytology pending, will treat if indicated.  Return precautions discussed, pt verbalized understanding and is agreeable to plan. Final Clinical Impressions(s) / UC Diagnoses   Final diagnoses:  Unprotected sex  Screening examination for venereal disease   Discharge Instructions   None    ED Prescriptions    None  PDMP not reviewed this encounter.   Odette Fraction [place], [place] 06/19/20 1932

## 2020-06-19 NOTE — ED Triage Notes (Signed)
Patient presents for STD testing.   Patient endorses a possible exposure to STD.   Patient denies any penile drainage, dysuria, or ABD pain.

## 2020-06-21 LAB — CYTOLOGY, (ORAL, ANAL, URETHRAL) ANCILLARY ONLY
Chlamydia: NEGATIVE
Comment: NEGATIVE
Comment: NEGATIVE
Comment: NORMAL
Neisseria Gonorrhea: NEGATIVE
Trichomonas: POSITIVE — AB

## 2020-06-22 ENCOUNTER — Telehealth (HOSPITAL_COMMUNITY): Payer: Self-pay | Admitting: Emergency Medicine

## 2020-06-22 MED ORDER — METRONIDAZOLE 500 MG PO TABS: 500.0000 mg | ORAL_TABLET | Freq: Two times a day (BID) | ORAL | 0 refills | Status: DC

## 2020-08-08 ENCOUNTER — Ambulatory Visit (INDEPENDENT_AMBULATORY_CARE_PROVIDER_SITE_OTHER): Payer: BLUE CROSS/BLUE SHIELD

## 2020-08-08 DIAGNOSIS — I428 Other cardiomyopathies: Secondary | ICD-10-CM

## 2020-08-09 LAB — CUP PACEART REMOTE DEVICE CHECK
Battery Remaining Percentage: 61 %
Date Time Interrogation Session: 20220201202100
Implantable Lead Implant Date: 20180706
Implantable Lead Location: 753862
Implantable Lead Model: 3401
Implantable Lead Serial Number: 114025
Implantable Pulse Generator Implant Date: 20180706
Pulse Gen Serial Number: 228369

## 2020-08-17 NOTE — Progress Notes (Signed)
Remote ICD transmission.   

## 2020-10-27 ENCOUNTER — Other Ambulatory Visit: Payer: Self-pay

## 2020-10-27 ENCOUNTER — Ambulatory Visit
Admission: RE | Admit: 2020-10-27 | Discharge: 2020-10-27 | Disposition: A | Discharge status: Home / Self Care | Payer: BLUE CROSS/BLUE SHIELD | Source: Ambulatory Visit | Attending: Family Medicine | Admitting: Family Medicine

## 2020-10-27 VITALS — BP 123/86 | HR 74 | Temp 98.5°F | Resp 16

## 2020-10-27 DIAGNOSIS — Z202 Contact with and (suspected) exposure to infections with a predominantly sexual mode of transmission: Secondary | ICD-10-CM | POA: Insufficient documentation

## 2020-10-27 DIAGNOSIS — N4889 Other specified disorders of penis: Secondary | ICD-10-CM | POA: Insufficient documentation

## 2020-10-27 LAB — POCT URINALYSIS DIP (MANUAL ENTRY)
Bilirubin, UA: NEGATIVE
Glucose, UA: NEGATIVE mg/dL
Ketones, POC UA: NEGATIVE mg/dL
Leukocytes, UA: NEGATIVE
Nitrite, UA: NEGATIVE
Protein Ur, POC: NEGATIVE mg/dL
Spec Grav, UA: 1.025 (ref 1.010–1.025)
Urobilinogen, UA: 1 E.U./dL
pH, UA: 5.5 (ref 5.0–8.0)

## 2020-10-27 MED ORDER — DOXYCYCLINE HYCLATE 100 MG PO CAPS: 100.0000 mg | ORAL_CAPSULE | Freq: Two times a day (BID) | ORAL | 0 refills | Status: DC

## 2020-10-27 MED ORDER — METRONIDAZOLE 500 MG PO TABS: 500.0000 mg | ORAL_TABLET | Freq: Two times a day (BID) | ORAL | 0 refills | Status: DC

## 2020-10-27 MED ORDER — CEFTRIAXONE SODIUM 500 MG IJ SOLR
500.0000 mg | Freq: Once | INTRAMUSCULAR | Status: AC
  Administered 2020-10-27: 500 mg via INTRAMUSCULAR

## 2020-10-27 NOTE — ED Provider Notes (Signed)
[name]    CSN: [id number] Arrival date & time: 10/27/20  1344      History   Chief Complaint Chief Complaint  Patient presents with  . Abdominal Pain    HPI Nathan Baird is a 40 y.o. male.   Patient here today with 3-day history of urethral irritation, itching, mild burning with urination.  He denies penile discharge, rashes, lesions, pelvic or abdominal pain, nausea vomiting diarrhea.  States he recently was sexually active with an old partner who gave him trichomonas in the past and states that this feels similar.  Has not tried anything over-the-counter for symptoms thus far.     Past Medical History:  Diagnosis Date  . CHF (congestive heart failure) (HCC)   . Cough    a. PFTS 09/2012: restriction probable, further examinations recommended. Saw pulm 11/2012: placed on GERD rx and short course prednisone, planned to regroup.  . CVA (cerebral vascular accident) (HCC) 01/23/2013   tPA administered; MRI brain showed small acute R MCA infarct  . Nonischemic cardiomyopathy (HCC)    a. Identified 10/2012 by echo EF 20-25%. b. Echo 01/2013: EF 20-25%, TEE EF 15% with severe RV dysfunction as well.  . Paroxysmal atrial fibrillation (HCC)   . PFO (patent foramen ovale)    a. By TEE 01/2013.    Patient Active Problem List   Diagnosis Date Noted  . ICD (implantable cardioverter-defibrillator) in place 10/11/2019  . Chest pain 02/19/2018  . NICM (nonischemic cardiomyopathy) (HCC) 01/10/2017  . Trapezius muscle strain 11/24/2014  . Right shoulder pain 08/24/2014  . TIA (transient ischemic attack) 05/20/2014  . History of CVA (cerebrovascular accident)   . Essential hypertension   . HLD (hyperlipidemia)   . Encounter for long-term (current) use of high-risk medication 01/27/2013  . Patent foramen ovale with right to left shunt 01/27/2013  . Stroke, acute, embolic (HCC) 01/25/2013  . Cough 10/20/2012  . Chronic systolic congestive heart failure (HCC) 10/20/2012   . Penile pain 05/19/2012  . ATTENTION DEFICIT, W/HYPERACTIVITY 09/04/2006    Past Surgical History:  Procedure Laterality Date  . NO PAST SURGERIES    . SUBQ ICD IMPLANT N/A 01/10/2017   Procedure: SubQ ICD Implant;  Surgeon: Duke Salvia, MD;  Location: [name] INVASIVE CV LAB;  Service: Cardiovascular;  Laterality: N/A;  . TEE WITHOUT CARDIOVERSION N/A 01/26/2013   Procedure: TRANSESOPHAGEAL ECHOCARDIOGRAM (TEE);  Surgeon: [name], MD;  Location: [facility] ENDOSCOPY;  Service: Cardiovascular;  Laterality: N/A;       Home Medications    Prior to Admission medications   Medication Sig Start Date End Date Taking? Authorizing Provider  apixaban (ELIQUIS) 5 MG TABS tablet Take 1 tablet (5 mg total) by mouth 2 (two) times daily. 07/02/18   Duke Salvia, MD  atorvastatin (LIPITOR) 10 MG tablet Take 1 tablet (10 mg total) by mouth daily at 6 PM. 10/24/16   Nahser, Deloris Ping, MD  carvedilol (COREG) 25 MG tablet Take 1 tablet (25 mg total) by mouth 2 (two) times daily with a meal. 10/12/19   Duke Salvia, MD  doxycycline (VIBRAMYCIN) 100 MG capsule Take 1 capsule (100 mg total) by mouth 2 (two) times daily. 10/27/20   Particia Nearing, PA-C  metroNIDAZOLE (FLAGYL) 500 MG tablet Take 1 tablet (500 mg total) by mouth 2 (two) times daily. 10/27/20   Particia Nearing, PA-C  sacubitril-valsartan (ENTRESTO) 49-51 MG Take 1 tablet by mouth 2 (two) times daily. 09/14/19   Nahser, Loistine Chance  J, MD  spironolactone (ALDACTONE) 25 MG tablet Take 1 tablet (25 mg total) by mouth daily. 09/14/19   Nahser, Deloris Ping, MD    Family History Family History  Problem Relation Age of Onset  . Sarcoidosis Mother   . Heart Problems Mother        PPM implantation age 43    Social History Social History   Tobacco Use  . Smoking status: Never Smoker  . Smokeless tobacco: Never Used  Vaping Use  . Vaping Use: Never used  Substance Use Topics  . Alcohol use: Yes    Comment: occ  . Drug use: No      Allergies   Patient has no known allergies.   Review of Systems Review of Systems Per HPI  Physical Exam Triage Vital Signs ED Triage Vitals  Enc Vitals Group     BP 10/27/20 1405 123/86     Pulse Rate 10/27/20 1405 74     Resp 10/27/20 1405 16     Temp 10/27/20 1405 98.5 F (36.9 C)     Temp Source 10/27/20 1405 Oral     SpO2 10/27/20 1405 96 %     Weight --      Height --      Head Circumference --      Peak Flow --      Pain Score 10/27/20 1406 2     Pain Loc --      Pain Edu? --      Excl. in GC? --    No data found.  Updated Vital Signs BP 123/86 (BP Location: Left Arm)   Pulse 74   Temp 98.5 F (36.9 C) (Oral)   Resp 16   SpO2 96%   Visual Acuity Right Eye Distance:   Left Eye Distance:   Bilateral Distance:    Right Eye Near:   Left Eye Near:    Bilateral Near:     Physical Exam Vitals and nursing note reviewed.  Constitutional:      Appearance: Normal appearance.  HENT:     Head: Atraumatic.     Mouth/Throat:     Mouth: Mucous membranes are moist.     Pharynx: Oropharynx is clear.  Eyes:     Extraocular Movements: Extraocular movements intact.     Conjunctiva/sclera: Conjunctivae normal.  Cardiovascular:     Rate and Rhythm: Normal rate and regular rhythm.  Pulmonary:     Effort: Pulmonary effort is normal.     Breath sounds: Normal breath sounds.  Abdominal:     General: Bowel sounds are normal. There is no distension.     Palpations: Abdomen is soft.     Tenderness: There is no abdominal tenderness. There is no right CVA tenderness, left CVA tenderness or guarding.  Genitourinary:    Comments: GU exam deferred, self swab performed Musculoskeletal:        General: Normal range of motion.     Cervical back: Normal range of motion and neck supple.  Skin:    General: Skin is warm and dry.  Neurological:     General: No focal deficit present.     Mental Status: He is oriented to person, place, and time.  Psychiatric:         Mood and Affect: Mood normal.        Thought Content: Thought content normal.        Judgment: Judgment normal.      UC Treatments / Results  Labs (all  labs ordered are listed, but only abnormal results are displayed) Labs Reviewed  POCT URINALYSIS DIP (MANUAL ENTRY) - Abnormal; Notable for the following components:      Result Value   Blood, UA trace-intact (*)    All other components within normal limits  CYTOLOGY, (ORAL, ANAL, URETHRAL) ANCILLARY ONLY    EKG   Radiology No results found.  Procedures Procedures (including critical care time)  Medications Ordered in UC Medications  cefTRIAXone (ROCEPHIN) injection 500 mg (500 mg Intramuscular Given 10/27/20 1448)    Initial Impression / Assessment and Plan / UC Course  I have reviewed the triage vital signs and the nursing notes.  Pertinent labs & imaging results that were available during my care of the patient were reviewed by me and considered in my medical decision making (see chart for details).     UA without evidence of urinary tract infection, cytology swab pending.  He requests treatment for STDs given his symptoms and past history of these.  We will treat with IM Rocephin in clinic, doxycycline and Flagyl to the pharmacy.  Abstinence reviewed until results return.  Safe sexual practices reviewed.  Final Clinical Impressions(s) / UC Diagnoses   Final diagnoses:  Penile irritation  Exposure to STD   Discharge Instructions   None    ED Prescriptions    Medication Sig Dispense Auth. Provider   metroNIDAZOLE (FLAGYL) 500 MG tablet Take 1 tablet (500 mg total) by mouth 2 (two) times daily. 14 tablet Particia Nearing, [place]   doxycycline (VIBRAMYCIN) 100 MG capsule Take 1 capsule (100 mg total) by mouth 2 (two) times daily. 14 capsule Particia Nearing, [place]     PDMP not reviewed this encounter.   Particia Nearing, [place] 10/27/20 1552

## 2020-10-27 NOTE — ED Triage Notes (Signed)
Pt present lower abdominal pain with frequency urinating. Symptom started three days ago.

## 2020-10-30 LAB — CYTOLOGY, (ORAL, ANAL, URETHRAL) ANCILLARY ONLY
Chlamydia: NEGATIVE
Comment: NEGATIVE
Comment: NEGATIVE
Comment: NORMAL
Neisseria Gonorrhea: NEGATIVE
Trichomonas: NEGATIVE

## 2020-11-07 ENCOUNTER — Ambulatory Visit (INDEPENDENT_AMBULATORY_CARE_PROVIDER_SITE_OTHER): Payer: BLUE CROSS/BLUE SHIELD

## 2020-11-07 DIAGNOSIS — I428 Other cardiomyopathies: Secondary | ICD-10-CM | POA: Diagnosis not present

## 2020-11-09 LAB — CUP PACEART REMOTE DEVICE CHECK
Battery Remaining Percentage: 58 %
Date Time Interrogation Session: 20220504205600
Implantable Lead Implant Date: 20180706
Implantable Lead Location: 753862
Implantable Lead Model: 3401
Implantable Lead Serial Number: 114025
Implantable Pulse Generator Implant Date: 20180706
Pulse Gen Serial Number: 228369

## 2020-11-16 DIAGNOSIS — N469 Male infertility, unspecified: Secondary | ICD-10-CM | POA: Diagnosis not present

## 2020-11-16 DIAGNOSIS — Z3141 Encounter for fertility testing: Secondary | ICD-10-CM | POA: Diagnosis not present

## 2020-11-26 DIAGNOSIS — Z202 Contact with and (suspected) exposure to infections with a predominantly sexual mode of transmission: Secondary | ICD-10-CM | POA: Diagnosis not present

## 2020-11-29 NOTE — Progress Notes (Signed)
Remote ICD transmission.   

## 2021-01-16 ENCOUNTER — Ambulatory Visit
Admission: EM | Admit: 2021-01-16 | Discharge: 2021-01-16 | Disposition: A | Discharge status: Home / Self Care | Payer: BLUE CROSS/BLUE SHIELD | Attending: Student | Admitting: Student

## 2021-01-16 ENCOUNTER — Other Ambulatory Visit: Payer: Self-pay

## 2021-01-16 DIAGNOSIS — I509 Heart failure, unspecified: Secondary | ICD-10-CM | POA: Diagnosis not present

## 2021-01-16 DIAGNOSIS — Z1152 Encounter for screening for COVID-19: Secondary | ICD-10-CM | POA: Diagnosis not present

## 2021-01-16 DIAGNOSIS — Z20822 Contact with and (suspected) exposure to covid-19: Secondary | ICD-10-CM | POA: Diagnosis not present

## 2021-01-16 DIAGNOSIS — J069 Acute upper respiratory infection, unspecified: Secondary | ICD-10-CM | POA: Diagnosis not present

## 2021-01-16 MED ORDER — PROMETHAZINE-DM 6.25-15 MG/5ML PO SYRP: 5.0000 mL | ORAL_SOLUTION | Freq: Four times a day (QID) | ORAL | 0 refills | Status: DC | PRN

## 2021-01-16 MED ORDER — ONDANSETRON 8 MG PO TBDP: 8.0000 mg | ORAL_TABLET | Freq: Three times a day (TID) | ORAL | 0 refills | Status: DC | PRN

## 2021-01-16 MED ORDER — BENZONATATE 100 MG PO CAPS: 100.0000 mg | ORAL_CAPSULE | Freq: Three times a day (TID) | ORAL | 0 refills | Status: DC

## 2021-01-16 MED ORDER — PREDNISONE 20 MG PO TABS: 40.0000 mg | ORAL_TABLET | Freq: Every day | ORAL | 0 refills | Status: AC

## 2021-01-16 NOTE — ED Provider Notes (Signed)
[name]    CSN: [id number] Arrival date & time: 01/16/21  1332      History   Chief Complaint Chief Complaint  Patient presents with   Cough   Nasal Congestion   Headache    HPI Nathan Baird is a 40 y.o. male presenting with viral symptoms for about 2 days.  Medical history CHF, GERD, CVA, nonischemic cardiomyopathy, paroxysmal A. Fib.  Endorses 3 days of cough productive of yellow sputum, nasal congestion, headaches, body aches, subjective chills, fatigue, nausea without vomiting or diarrhea.  Mucinex providing minimal relief.  Denies abdominal pain, chest pain, dizziness, shortness of breath, weakness.  HPI  Past Medical History:  Diagnosis Date   CHF (congestive heart failure) (HCC)    Cough    a. PFTS 09/2012: restriction probable, further examinations recommended. Saw pulm 11/2012: placed on GERD rx and short course prednisone, planned to regroup.   CVA (cerebral vascular accident) (HCC) 01/23/2013   tPA administered; MRI brain showed small acute R MCA infarct   Nonischemic cardiomyopathy (HCC)    a. Identified 10/2012 by echo EF 20-25%. b. Echo 01/2013: EF 20-25%, TEE EF 15% with severe RV dysfunction as well.   Paroxysmal atrial fibrillation (HCC)    PFO (patent foramen ovale)    a. By TEE 01/2013.    Patient Active Problem List   Diagnosis Date Noted   ICD (implantable cardioverter-defibrillator) in place 10/11/2019   Chest pain 02/19/2018   NICM (nonischemic cardiomyopathy) (HCC) 01/10/2017   Trapezius muscle strain 11/24/2014   Right shoulder pain 08/24/2014   TIA (transient ischemic attack) 05/20/2014   History of CVA (cerebrovascular accident)    Essential hypertension    HLD (hyperlipidemia)    Encounter for long-term (current) use of high-risk medication 01/27/2013   Patent foramen ovale with right to left shunt 01/27/2013   Stroke, acute, embolic (HCC) 01/25/2013   Cough 10/20/2012   Chronic systolic congestive heart failure (HCC)  10/20/2012   Penile pain 05/19/2012   ATTENTION DEFICIT, W/HYPERACTIVITY 09/04/2006    Past Surgical History:  Procedure Laterality Date   NO PAST SURGERIES     SUBQ ICD IMPLANT N/A 01/10/2017   Procedure: SubQ ICD Implant;  Surgeon: Duke Salvia, MD;  Location: [facility] INVASIVE CV LAB;  Service: Cardiovascular;  Laterality: N/A;   TEE WITHOUT CARDIOVERSION N/A 01/26/2013   Procedure: TRANSESOPHAGEAL ECHOCARDIOGRAM (TEE);  Surgeon: [name], MD;  Location: [facility] ENDOSCOPY;  Service: Cardiovascular;  Laterality: N/A;       Home Medications    Prior to Admission medications   Medication Sig Start Date End Date Taking? Authorizing Provider  benzonatate (TESSALON) 100 MG capsule Take 1 capsule (100 mg total) by mouth every 8 (eight) hours. 01/16/21  Yes Rhys Martini, PA-C  ondansetron (ZOFRAN ODT) 8 MG disintegrating tablet Take 1 tablet (8 mg total) by mouth every 8 (eight) hours as needed for nausea or vomiting. 01/16/21  Yes Rhys Martini, PA-C  predniSONE (DELTASONE) 20 MG tablet Take 2 tablets (40 mg total) by mouth daily for 5 days. 01/16/21 01/21/21 Yes Rhys Martini, PA-C  promethazine-dextromethorphan (PROMETHAZINE-DM) 6.25-15 MG/5ML syrup Take 5 mLs by mouth 4 (four) times daily as needed for cough. 01/16/21  Yes Rhys Martini, PA-C  apixaban (ELIQUIS) 5 MG TABS tablet Take 1 tablet (5 mg total) by mouth 2 (two) times daily. 07/02/18   Duke Salvia, MD  atorvastatin (LIPITOR) 10 MG tablet Take 1 tablet (10 mg total) by mouth  daily at 6 PM. 10/24/16   Nahser, Deloris Ping, MD  carvedilol (COREG) 25 MG tablet Take 1 tablet (25 mg total) by mouth 2 (two) times daily with a meal. 10/12/19   Duke Salvia, MD  doxycycline (VIBRAMYCIN) 100 MG capsule Take 1 capsule (100 mg total) by mouth 2 (two) times daily. 10/27/20   Particia Nearing, PA-C  metroNIDAZOLE (FLAGYL) 500 MG tablet Take 1 tablet (500 mg total) by mouth 2 (two) times daily. 10/27/20   Particia Nearing, PA-C   sacubitril-valsartan (ENTRESTO) 49-51 MG Take 1 tablet by mouth 2 (two) times daily. 09/14/19   Nahser, Deloris Ping, MD  spironolactone (ALDACTONE) 25 MG tablet Take 1 tablet (25 mg total) by mouth daily. 09/14/19   Nahser, Deloris Ping, MD    Family History Family History  Problem Relation Age of Onset   Sarcoidosis Mother    Heart Problems Mother        PPM implantation age 41    Social History Social History   Tobacco Use   Smoking status: Never   Smokeless tobacco: Never  Vaping Use   Vaping Use: Never used  Substance Use Topics   Alcohol use: Yes    Comment: occ   Drug use: No     Allergies   Patient has no known allergies.   Review of Systems Review of Systems  Constitutional:  Positive for chills, fatigue and fever. Negative for appetite change, diaphoresis and unexpected weight change.  HENT:  Positive for congestion. Negative for ear pain, rhinorrhea, sinus pressure, sinus pain, sneezing, sore throat and trouble swallowing.   Eyes:  Negative for redness and visual disturbance.  Respiratory:  Positive for cough. Negative for chest tightness, shortness of breath and wheezing.   Cardiovascular:  Negative for chest pain and palpitations.  Gastrointestinal:  Positive for nausea. Negative for abdominal distention, abdominal pain, anal bleeding, blood in stool, constipation, diarrhea, rectal pain and vomiting.  Genitourinary:  Negative for dysuria, flank pain, frequency and urgency.  Musculoskeletal:  Positive for myalgias. Negative for back pain.  Neurological:  Negative for dizziness, weakness, light-headedness and headaches.  Psychiatric/Behavioral:  Negative for confusion.   All other systems reviewed and are negative.   Physical Exam Triage Vital Signs ED Triage Vitals  Enc Vitals Group     BP 01/16/21 1521 114/71     Pulse Rate 01/16/21 1521 99     Resp 01/16/21 1521 18     Temp 01/16/21 1521 (!) 101.1 F (38.4 C)     Temp Source 01/16/21 1521 Oral     SpO2  01/16/21 1521 94 %     Weight --      Height --      Head Circumference --      Peak Flow --      Pain Score 01/16/21 1525 6     Pain Loc --      Pain Edu? --      Excl. in GC? --    No data found.  Updated Vital Signs BP 114/71 (BP Location: Left Arm)   Pulse 99   Temp (!) 101.1 F (38.4 C) (Oral)   Resp 18   SpO2 94%   Visual Acuity Right Eye Distance:   Left Eye Distance:   Bilateral Distance:    Right Eye Near:   Left Eye Near:    Bilateral Near:     Physical Exam Vitals reviewed.  Constitutional:      General: He is not  in acute distress.    Appearance: Normal appearance. He is ill-appearing. He is not diaphoretic.  HENT:     Head: Normocephalic and atraumatic.     Right Ear: Hearing, tympanic membrane, ear canal and external ear normal. No swelling or tenderness. There is no impacted cerumen. No mastoid tenderness. Tympanic membrane is not perforated, erythematous, retracted or bulging.     Left Ear: Hearing, tympanic membrane, ear canal and external ear normal. No swelling or tenderness. There is no impacted cerumen. No mastoid tenderness. Tympanic membrane is not perforated, erythematous, retracted or bulging.     Nose:     Right Sinus: No maxillary sinus tenderness or frontal sinus tenderness.     Left Sinus: No maxillary sinus tenderness or frontal sinus tenderness.     Mouth/Throat:     Mouth: Mucous membranes are moist.     Pharynx: Uvula midline. No oropharyngeal exudate or posterior oropharyngeal erythema.     Tonsils: No tonsillar exudate.  Eyes:     Extraocular Movements: Extraocular movements intact.     Pupils: Pupils are equal, round, and reactive to light.  Cardiovascular:     Rate and Rhythm: Normal rate and regular rhythm.     Pulses:          Radial pulses are 2+ on the right side and 2+ on the left side.     Heart sounds: Normal heart sounds.  Pulmonary:     Effort: Pulmonary effort is normal.     Breath sounds: Normal breath sounds and  air entry. No wheezing, rhonchi or rales.  Chest:     Chest wall: No tenderness.  Abdominal:     General: Abdomen is flat. Bowel sounds are normal.     Palpations: Abdomen is soft.     Tenderness: There is no abdominal tenderness. There is no guarding or rebound.  Musculoskeletal:     Right lower leg: No edema.     Left lower leg: No edema.  Lymphadenopathy:     Cervical: No cervical adenopathy.  Skin:    General: Skin is warm.     Capillary Refill: Capillary refill takes less than 2 seconds.  Neurological:     General: No focal deficit present.     Mental Status: He is alert and oriented to person, place, and time.  Psychiatric:        Attention and Perception: Attention and perception normal.        Mood and Affect: Mood and affect normal.        Behavior: Behavior normal. Behavior is cooperative.        Thought Content: Thought content normal.        Judgment: Judgment normal.     UC Treatments / Results  Labs (all labs ordered are listed, but only abnormal results are displayed) Labs Reviewed  COVID-19, FLU A+B NAA    EKG   Radiology No results found.  Procedures Procedures (including critical care time)  Medications Ordered in UC Medications - No data to display  Initial Impression / Assessment and Plan / UC Course  I have reviewed the triage vital signs and the nursing notes.  Pertinent labs & imaging results that were available during my care of the patient were reviewed by me and considered in my medical decision making (see chart for details).     This patient is a very pleasant 40 y.o. year old male presenting with viral URI with cough. Today this pt is febrile at 101.1 but  nontachycardic nontachypneic, oxygenating well on room air, no wheezes rhonchi or rales.   Promethazine, tessalon, prednisone, zofran ODT. Good hydration.  Covid and influenza sent.  Patient with diagnosis of CHF, no chest pain or pedal edema today.  Strict ED return  precautions discussed. Patient verbalizes understanding and agreement.   Coding this visit a level 4 for acute illness with systemic symptoms and prescription drug management  Final Clinical Impressions(s) / UC Diagnoses   Final diagnoses:  Viral URI with cough  Encounter for screening for COVID-19  Chronic congestive heart failure, unspecified heart failure type [facility])     Discharge Instructions      -Promethazine DM cough syrup for congestion/cough. This could make you drowsy, so take at night before bed. -Prednisone, 2 pills taken at the same time for 5 days in a row.  Try taking this earlier in the day as it can give you energy. Avoid NSAIDs like ibuprofen and alleve while taking this medication as they can increase your risk of stomach upset and even GI bleeding when in combination with a steroid. You can continue tylenol (acetaminophen) up to 1000mg  3x daily. -Tessalon (Benzonatate) as needed for cough. Take one pill up to 3x daily (every 8 hours) -Take the Zofran (ondansetron) up to 3 times daily for nausea and vomiting. Dissolve one pill under your tongue or between your teeth and your cheek. -For fevers/chills- tylenol, up to 1000mg  (two pills) 3x daily. Avoid ibuprofen. -Drink plenty of fluids and get plenty of rest. -With a virus, you're typically contagious for 5-7 days, or as long as you're having fevers.  -Come back and see if new symptoms like shortness of breath, weakness, chest pain, dizziness, etc.     ED Prescriptions     Medication Sig Dispense Auth. Provider   promethazine-dextromethorphan (PROMETHAZINE-DM) 6.25-15 MG/5ML syrup Take 5 mLs by mouth 4 (four) times daily as needed for cough. 118 mL [place], PA-C   benzonatate (TESSALON) 100 MG capsule Take 1 capsule (100 mg total) by mouth every 8 (eight) hours. 21 capsule 09-[birth year]-1990, PA-C   predniSONE (DELTASONE) 20 MG tablet Take 2 tablets (40 mg total) by mouth daily for 5 days. 10 tablet Rhys Martini, PA-C   ondansetron (ZOFRAN ODT) 8 MG disintegrating tablet Take 1 tablet (8 mg total) by mouth every 8 (eight) hours as needed for nausea or vomiting. 20 tablet Rhys Martini, PA-C      PDMP not reviewed this encounter.   Rhys Martini, PA-C 01/16/21 1607

## 2021-01-16 NOTE — Discharge Instructions (Addendum)
-  Promethazine DM cough syrup for congestion/cough. This could make you drowsy, so take at night before bed. -Prednisone, 2 pills taken at the same time for 5 days in a row.  Try taking this earlier in the day as it can give you energy. Avoid NSAIDs like ibuprofen and alleve while taking this medication as they can increase your risk of stomach upset and even GI bleeding when in combination with a steroid. You can continue tylenol (acetaminophen) up to 1000mg  3x daily. -Tessalon (Benzonatate) as needed for cough. Take one pill up to 3x daily (every 8 hours) -Take the Zofran (ondansetron) up to 3 times daily for nausea and vomiting. Dissolve one pill under your tongue or between your teeth and your cheek. -For fevers/chills- tylenol, up to 1000mg  (two pills) 3x daily. Avoid ibuprofen. -Drink plenty of fluids and get plenty of rest. -With a virus, you're typically contagious for 5-7 days, or as long as you're having fevers.  -Come back and see if new symptoms like shortness of breath, weakness, chest pain, dizziness, etc.

## 2021-01-16 NOTE — ED Triage Notes (Signed)
Three days of cough and congestion with worsening last night. Pt reports that he woke up this morning with HA, body aches and feeling drowsy. Sxs are interfering with his sleep. Has been taking Mucinex with a "touch" of relief.Denies abdominal pain, v/d. Confirms nausea and decreased appetite.

## 2021-01-17 LAB — COVID-19, FLU A+B NAA
Influenza A, NAA: NOT DETECTED
Influenza B, NAA: NOT DETECTED
SARS-CoV-2, NAA: DETECTED — AB

## 2021-01-22 ENCOUNTER — Encounter: Payer: Self-pay | Admitting: Emergency Medicine

## 2021-01-22 ENCOUNTER — Ambulatory Visit
Admission: EM | Admit: 2021-01-22 | Discharge: 2021-01-22 | Disposition: A | Discharge status: Home / Self Care | Payer: BLUE CROSS/BLUE SHIELD

## 2021-01-22 ENCOUNTER — Other Ambulatory Visit: Payer: Self-pay

## 2021-01-22 DIAGNOSIS — U071 COVID-19: Secondary | ICD-10-CM | POA: Diagnosis not present

## 2021-01-22 NOTE — Discharge Instructions (Addendum)
Tylenol every 4 hours.  Drink plenty of fluids.   

## 2021-01-22 NOTE — ED Provider Notes (Signed)
[name]    CSN: [id number] Arrival date & time: 01/22/21  1050      History   Chief Complaint Chief Complaint  Patient presents with   Cough   Nasal Congestion   Sore Throat    HPI Nathan Baird is a 40 y.o. male.   Pt diagnosed with covid last week.  Pt reports he feels better but is still dizzy and weak   The history is provided by the patient. No language interpreter was used.  Cough Sputum characteristics:  Nondescript Severity:  Moderate Timing:  Constant Progression:  Worsening Chronicity:  New Worsened by:  Nothing Ineffective treatments:  None tried Associated symptoms: no chills, no ear pain, no fever, no rash, no shortness of breath and no sore throat   Sore Throat Pertinent negatives include no abdominal pain and no shortness of breath.   Past Medical History:  Diagnosis Date   CHF (congestive heart failure) (HCC)    Cough    a. PFTS 09/2012: restriction probable, further examinations recommended. Saw pulm 11/2012: placed on GERD rx and short course prednisone, planned to regroup.   CVA (cerebral vascular accident) (HCC) 01/23/2013   tPA administered; MRI brain showed small acute R MCA infarct   Nonischemic cardiomyopathy (HCC)    a. Identified 10/2012 by echo EF 20-25%. b. Echo 01/2013: EF 20-25%, TEE EF 15% with severe RV dysfunction as well.   Paroxysmal atrial fibrillation (HCC)    PFO (patent foramen ovale)    a. By TEE 01/2013.    Patient Active Problem List   Diagnosis Date Noted   ICD (implantable cardioverter-defibrillator) in place 10/11/2019   Chest pain 02/19/2018   NICM (nonischemic cardiomyopathy) (HCC) 01/10/2017   Trapezius muscle strain 11/24/2014   Right shoulder pain 08/24/2014   TIA (transient ischemic attack) 05/20/2014   History of CVA (cerebrovascular accident)    Essential hypertension    HLD (hyperlipidemia)    Encounter for long-term (current) use of high-risk medication 01/27/2013   Patent foramen ovale  with right to left shunt 01/27/2013   Stroke, acute, embolic (HCC) 01/25/2013   Cough 10/20/2012   Chronic systolic congestive heart failure (HCC) 10/20/2012   Penile pain 05/19/2012   ATTENTION DEFICIT, W/HYPERACTIVITY 09/04/2006    Past Surgical History:  Procedure Laterality Date   NO PAST SURGERIES     SUBQ ICD IMPLANT N/A 01/10/2017   Procedure: SubQ ICD Implant;  Surgeon: Duke Salvia, MD;  Location: [facility] INVASIVE CV LAB;  Service: Cardiovascular;  Laterality: N/A;   TEE WITHOUT CARDIOVERSION N/A 01/26/2013   Procedure: TRANSESOPHAGEAL ECHOCARDIOGRAM (TEE);  Surgeon: [name], MD;  Location: [facility] ENDOSCOPY;  Service: Cardiovascular;  Laterality: N/A;       Home Medications    Prior to Admission medications   Medication Sig Start Date End Date Taking? Authorizing Provider  apixaban (ELIQUIS) 5 MG TABS tablet Take 1 tablet (5 mg total) by mouth 2 (two) times daily. 07/02/18   Duke Salvia, MD  atorvastatin (LIPITOR) 10 MG tablet Take 1 tablet (10 mg total) by mouth daily at 6 PM. 10/24/16   Nahser, Deloris Ping, MD  benzonatate (TESSALON) 100 MG capsule Take 1 capsule (100 mg total) by mouth every 8 (eight) hours. 01/16/21   Rhys Martini, PA-C  carvedilol (COREG) 25 MG tablet Take 1 tablet (25 mg total) by mouth 2 (two) times daily with a meal. 10/12/19   Duke Salvia, MD  doxycycline (VIBRAMYCIN) 100 MG capsule Take 1  capsule (100 mg total) by mouth 2 (two) times daily. 10/27/20   Particia Nearing, PA-C  metroNIDAZOLE (FLAGYL) 500 MG tablet Take 1 tablet (500 mg total) by mouth 2 (two) times daily. 10/27/20   Particia Nearing, PA-C  ondansetron (ZOFRAN ODT) 8 MG disintegrating tablet Take 1 tablet (8 mg total) by mouth every 8 (eight) hours as needed for nausea or vomiting. 01/16/21   Rhys Martini, PA-C  promethazine-dextromethorphan (PROMETHAZINE-DM) 6.25-15 MG/5ML syrup Take 5 mLs by mouth 4 (four) times daily as needed for cough. 01/16/21   Rhys Martini, PA-C   sacubitril-valsartan (ENTRESTO) 49-51 MG Take 1 tablet by mouth 2 (two) times daily. 09/14/19   Nahser, Deloris Ping, MD  spironolactone (ALDACTONE) 25 MG tablet Take 1 tablet (25 mg total) by mouth daily. 09/14/19   Nahser, Deloris Ping, MD    Family History Family History  Problem Relation Age of Onset   Sarcoidosis Mother    Heart Problems Mother        PPM implantation age 28    Social History Social History   Tobacco Use   Smoking status: Never   Smokeless tobacco: Never  Vaping Use   Vaping Use: Never used  Substance Use Topics   Alcohol use: Yes    Comment: occ   Drug use: No     Allergies   Patient has no known allergies.   Review of Systems Review of Systems  Constitutional:  Negative for chills and fever.  HENT:  Negative for ear pain and sore throat.   Respiratory:  Positive for cough. Negative for shortness of breath.   Gastrointestinal:  Negative for abdominal pain and vomiting.  Skin:  Negative for color change and rash.  All other systems reviewed and are negative.   Physical Exam Triage Vital Signs ED Triage Vitals  Enc Vitals Group     BP 01/22/21 1228 124/89     Pulse Rate 01/22/21 1228 87     Resp 01/22/21 1228 20     Temp 01/22/21 1228 98.9 F (37.2 C)     Temp Source 01/22/21 1228 Oral     SpO2 01/22/21 1228 97 %     Weight 01/22/21 1229 264 lb 8.8 oz (120 kg)     Height 01/22/21 1229 6\' 3"  (1.905 m)     Head Circumference --      Peak Flow --      Pain Score 01/22/21 1229 0     Pain Loc --      Pain Edu? --      Excl. in GC? --    No data found.  Updated Vital Signs BP 124/89   Pulse 87   Temp 98.9 F (37.2 C) (Oral)   Resp 20   Ht 6\' 3"  (1.905 m)   Wt 120 kg   SpO2 97%   BMI 33.07 kg/m   Visual Acuity Right Eye Distance:   Left Eye Distance:   Bilateral Distance:    Right Eye Near:   Left Eye Near:    Bilateral Near:     Physical Exam   UC Treatments / Results  Labs (all labs ordered are listed, but only abnormal  results are displayed) Labs Reviewed - No data to display  EKG   Radiology No results found.  Procedures Procedures (including critical care time)  Medications Ordered in UC Medications - No data to display  Initial Impression / Assessment and Plan / UC Course  I have reviewed  the triage vital signs and the nursing notes.  Pertinent labs & imaging results that were available during my care of the patient were reviewed by me and considered in my medical decision making (see chart for details).     MDM:  Pt advised to continue tylenol.  Pt given work note for 3 days  Final Clinical Impressions(s) / UC Diagnoses   Final diagnoses:  COVID     Discharge Instructions      Tylenol every 4 hours.  Drink plenty of fluids.     ED Prescriptions   None    PDMP not reviewed this encounter. An After Visit Summary was printed and given to the patient.    Elson Areas, [place] 01/22/21 1303

## 2021-01-22 NOTE — ED Triage Notes (Signed)
Pt here with COVID + sx that are persistent and is worried about returning to work today. Cough, fatigue, lightheaded, hot flashes.

## 2021-02-06 ENCOUNTER — Ambulatory Visit (INDEPENDENT_AMBULATORY_CARE_PROVIDER_SITE_OTHER): Payer: BLUE CROSS/BLUE SHIELD

## 2021-02-06 DIAGNOSIS — I428 Other cardiomyopathies: Secondary | ICD-10-CM | POA: Diagnosis not present

## 2021-02-07 LAB — CUP PACEART REMOTE DEVICE CHECK
Battery Remaining Percentage: 55 %
Date Time Interrogation Session: 20220803084200
Implantable Lead Implant Date: 20180706
Implantable Lead Location: 753862
Implantable Lead Model: 3401
Implantable Lead Serial Number: 114025
Implantable Pulse Generator Implant Date: 20180706
Pulse Gen Serial Number: 228369

## 2021-02-18 ENCOUNTER — Other Ambulatory Visit: Payer: Self-pay

## 2021-02-18 ENCOUNTER — Emergency Department (HOSPITAL_BASED_OUTPATIENT_CLINIC_OR_DEPARTMENT_OTHER)
Admission: EM | Admit: 2021-02-18 | Discharge: 2021-02-19 | Disposition: A | Discharge status: Home / Self Care | Payer: BLUE CROSS/BLUE SHIELD | Attending: Emergency Medicine | Admitting: Emergency Medicine

## 2021-02-18 ENCOUNTER — Encounter (HOSPITAL_BASED_OUTPATIENT_CLINIC_OR_DEPARTMENT_OTHER): Payer: Self-pay

## 2021-02-18 DIAGNOSIS — N4889 Other specified disorders of penis: Secondary | ICD-10-CM | POA: Insufficient documentation

## 2021-02-18 DIAGNOSIS — I509 Heart failure, unspecified: Secondary | ICD-10-CM | POA: Insufficient documentation

## 2021-02-18 LAB — URINALYSIS, ROUTINE W REFLEX MICROSCOPIC
Bilirubin Urine: NEGATIVE
Glucose, UA: NEGATIVE mg/dL
Hgb urine dipstick: NEGATIVE
Ketones, ur: 15 mg/dL — AB
Leukocytes,Ua: NEGATIVE
Nitrite: NEGATIVE
Protein, ur: NEGATIVE mg/dL
Specific Gravity, Urine: 1.025 (ref 1.005–1.030)
pH: 6 (ref 5.0–8.0)

## 2021-02-18 MED ORDER — CEFTRIAXONE SODIUM 500 MG IJ SOLR
500.0000 mg | Freq: Once | INTRAMUSCULAR | Status: AC
  Administered 2021-02-19: 500 mg via INTRAMUSCULAR
  Filled 2021-02-18: qty 500

## 2021-02-18 NOTE — ED Triage Notes (Signed)
Patient here for STD testing.  Having pain in penis and had new sexual partner. Denies discharge but has irritation with urination and reports his penis is aching and throbbing.

## 2021-02-19 LAB — WET PREP, GENITAL
Clue Cells Wet Prep HPF POC: NONE SEEN
Sperm: NONE SEEN
Trich, Wet Prep: NONE SEEN
WBC, Wet Prep HPF POC: NONE SEEN
Yeast Wet Prep HPF POC: NONE SEEN

## 2021-02-19 MED ORDER — DOXYCYCLINE HYCLATE 100 MG PO CAPS: 100.0000 mg | ORAL_CAPSULE | Freq: Two times a day (BID) | ORAL | 0 refills | Status: AC

## 2021-02-19 MED ORDER — DOXYCYCLINE HYCLATE 100 MG PO CAPS: 100.0000 mg | ORAL_CAPSULE | Freq: Two times a day (BID) | ORAL | 0 refills | Status: DC

## 2021-02-19 NOTE — ED Provider Notes (Signed)
[facility]GENCY [facility]PT [facility] Emergency Department Provider Note MRN:  [id number]  Arrival date & time: 02/19/21     Chief Complaint   SEXUALLY TRANSMITTED DISEASE   History of Present Illness   Nathan Baird is a 40 y.o. year-old male with a history of CHF, CVA presenting to the ED with chief complaint of STD.  Patient here for STD check.  Endorsing some penile irritation over the past 1 or 2 days.  Had a recent new sexual partner.  Denies fever, no rash, no other complaints.  Review of Systems  A problem-focused ROS was performed. Positive for penile pain.  Patient denies fever.  Patient's Health History    Past Medical History:  Diagnosis Date   CHF (congestive heart failure) (HCC)    Cough    a. PFTS 09/2012: restriction probable, further examinations recommended. Saw pulm 11/2012: placed on GERD rx and short course prednisone, planned to regroup.   CVA (cerebral vascular accident) (HCC) 01/23/2013   tPA administered; MRI brain showed small acute R MCA infarct   Nonischemic cardiomyopathy (HCC)    a. Identified 10/2012 by echo EF 20-25%. b. Echo 01/2013: EF 20-25%, TEE EF 15% with severe RV dysfunction as well.   Paroxysmal atrial fibrillation (HCC)    PFO (patent foramen ovale)    a. By TEE 01/2013.    Past Surgical History:  Procedure Laterality Date   NO PAST SURGERIES     SUBQ ICD IMPLANT N/A 01/10/2017   Procedure: SubQ ICD Implant;  Surgeon: Duke Salvia, MD;  Location: [facility] INVASIVE CV LAB;  Service: Cardiovascular;  Laterality: N/A;   TEE WITHOUT CARDIOVERSION N/A 01/26/2013   Procedure: TRANSESOPHAGEAL ECHOCARDIOGRAM (TEE);  Surgeon: [name], MD;  Location: [facility] ENDOSCOPY;  Service: Cardiovascular;  Laterality: N/A;    Family History  Problem Relation Age of Onset   Sarcoidosis Mother    Heart Problems Mother        PPM implantation age 75    Social History   Socioeconomic History   Marital status: Married    Spouse name: Not on file    Number of children: Not on file   Years of education: Not on file   Highest education level: Not on file  Occupational History   Not on file  Tobacco Use   Smoking status: Never   Smokeless tobacco: Never  Vaping Use   Vaping Use: Never used  Substance and Sexual Activity   Alcohol use: Not Currently    Comment: occ   Drug use: No   Sexual activity: Yes  Other Topics Concern   Not on file  Social History Narrative   Marital status: single     Children: 4 children (21, 50, 2, 32month old)      Lives: with girlfriend in [place].      Employment: works for city of [employer] exposure      Exercise: basketball every Friday night.   Social Determinants of Health   Financial Resource Strain: Not on file  Food Insecurity: Not on file  Transportation Needs: Not on file  Physical Activity: Not on file  Stress: Not on file  Social Connections: Not on file  Intimate Partner Violence: Not on file     Physical Exam   Vitals:   02/19/21 0024  BP: 109/77  Pulse: 74  Resp: 18  Temp: 98 F (36.7 C)  SpO2: 100%    CONSTITUTIONAL: Well-appearing, NAD NEURO:  Alert and oriented x 3, no  focal deficits EYES:  eyes equal and reactive ENT/NECK:  no LAD, no JVD CARDIO: Regular rate, well-perfused, normal S1 and S2 PULM:  CTAB no wheezing or rhonchi GI/GU:  normal bowel sounds, non-distended, non-tender; normal appearing external genitalia, no lymphadenopathy, no penile tenderness or discharge, no scrotal or testicular tenderness MSK/SPINE:  No gross deformities, no edema SKIN:  no rash, atraumatic PSYCH:  Appropriate speech and behavior  *Additional and/or pertinent findings included in MDM below  Diagnostic and Interventional Summary    EKG Interpretation  Date/Time:    Ventricular Rate:    PR Interval:    QRS Duration:   QT Interval:    QTC Calculation:   R Axis:     Text Interpretation:         Labs Reviewed  URINALYSIS, ROUTINE W REFLEX MICROSCOPIC -  Abnormal; Notable for the following components:      Result Value   Ketones, ur 15 (*)    All other components within normal limits  WET PREP, GENITAL  GC/CHLAMYDIA PROBE AMP ([place]) NOT AT [facility]    No orders to display    Medications  cefTRIAXone (ROCEPHIN) injection 500 mg (500 mg Intramuscular Given 02/19/21 0016)     Procedures  /  Critical Care Procedures  ED Course and Medical Decision Making  I have reviewed the triage vital signs, the nursing notes, and pertinent available records from the EMR.  Listed above are laboratory and imaging tests that I personally ordered, reviewed, and interpreted and then considered in my medical decision making (see below for details).  Normal exam, urinalysis normal, tested negative for trichomonas, treating empirically for GC and chlamydia.  No signs of systemic illness, appropriate for discharge.       Elmer Sow. Pilar Plate, MD [facility] Emergency Medicine [facility] [email or web address]  Final Clinical Impressions(s) / ED Diagnoses     ICD-10-CM   1. Pain, penile  N48.89       ED Discharge Orders          Ordered    doxycycline (VIBRAMYCIN) 100 MG capsule  2 times daily,   Status:  Discontinued        02/19/21 0032    doxycycline (VIBRAMYCIN) 100 MG capsule  2 times daily        02/19/21 0033             Discharge Instructions Discussed with and Provided to Patient:    Discharge Instructions      You were evaluated in the Emergency Department and after careful evaluation, we did not find any emergent condition requiring admission or further testing in the hospital.  Your exam/testing today was overall reassuring.  You tested negative for trichomonas.  We are treating you for gonorrhea and chlamydia.  Please take the doxycycline antibiotic as directed.  Avoid sexual activity during this antibiotic course.  Please return to the Emergency Department if you experience any worsening of your condition.   Thank you for allowing [place] to be a part of your care.        Sabas Sous, MD 02/19/21 [phone]

## 2021-02-19 NOTE — Discharge Instructions (Addendum)
You were evaluated in the Emergency Department and after careful evaluation, we did not find any emergent condition requiring admission or further testing in the hospital.  Your exam/testing today was overall reassuring.  You tested negative for trichomonas.  We are treating you for gonorrhea and chlamydia.  Please take the doxycycline antibiotic as directed.  Avoid sexual activity during this antibiotic course.  Please return to the Emergency Department if you experience any worsening of your condition.  Thank you for allowing [place] to be a part of your care.

## 2021-02-20 LAB — GC/CHLAMYDIA PROBE AMP (~~LOC~~) NOT AT ARMC
Chlamydia: NEGATIVE
Comment: NEGATIVE
Comment: NORMAL
Neisseria Gonorrhea: NEGATIVE

## 2021-02-28 ENCOUNTER — Other Ambulatory Visit: Payer: Self-pay | Admitting: *Deleted

## 2021-02-28 ENCOUNTER — Telehealth: Payer: Self-pay | Admitting: Internal Medicine

## 2021-02-28 DIAGNOSIS — I5022 Chronic systolic (congestive) heart failure: Secondary | ICD-10-CM

## 2021-02-28 DIAGNOSIS — Z8673 Personal history of transient ischemic attack (TIA), and cerebral infarction without residual deficits: Secondary | ICD-10-CM

## 2021-02-28 DIAGNOSIS — I4891 Unspecified atrial fibrillation: Secondary | ICD-10-CM

## 2021-02-28 DIAGNOSIS — I428 Other cardiomyopathies: Secondary | ICD-10-CM

## 2021-02-28 DIAGNOSIS — Z95810 Presence of automatic (implantable) cardiac defibrillator: Secondary | ICD-10-CM

## 2021-02-28 DIAGNOSIS — R0683 Snoring: Secondary | ICD-10-CM

## 2021-02-28 MED ORDER — ENTRESTO 49-51 MG PO TABS
1.0000 | ORAL_TABLET | Freq: Two times a day (BID) | ORAL | 0 refills | Status: DC
Start: 2021-02-28 — End: 2021-08-08

## 2021-02-28 MED ORDER — CARVEDILOL 25 MG PO TABS: 25.0000 mg | ORAL_TABLET | Freq: Two times a day (BID) | ORAL | 0 refills | Status: DC

## 2021-02-28 NOTE — Telephone Encounter (Signed)
*  STAT* If patient is at the pharmacy, call can be transferred to refill team.   1. Which medications need to be refilled? (please list name of each medication and dose if known)  carvedilol (COREG) 25 MG tablet sacubitril-valsartan (ENTRESTO) 49-51 MG  2. Which pharmacy/location (including street and city if local pharmacy) is medication to be sent to? [facility] [phone] - [place], [place] - [address]  3. Do they need a 30 day or 90 day supply? 90 day  Patient is out of medication. He has an appointment 03/07/2021 with Otilio Saber for a pacer check and 05/09/2021 with Dr. Elease Hashimoto.

## 2021-02-28 NOTE — Telephone Encounter (Signed)
Rx's have been sent in as requested. Confirmation received. 

## 2021-03-03 NOTE — Progress Notes (Signed)
Remote ICD transmission.   

## 2021-03-07 ENCOUNTER — Encounter: Payer: BLUE CROSS/BLUE SHIELD | Admitting: Student

## 2021-03-07 NOTE — Progress Notes (Deleted)
Electrophysiology Office Note Date: 03/07/2021  ID:  Nathan Baird, DOB [birth year], MRN [id number]  PCP: Patient, No Pcp Per (Inactive) Primary Cardiologist: None Electrophysiologist: Sherryl Manges, MD   CC: Routine ICD follow-up  Nathan Baird is a 40 y.o. male seen today for Sherryl Manges, MD for routine electrophysiology followup.  Since last being seen in our clinic the patient reports doing ***.  he denies chest pain, palpitations, dyspnea, PND, orthopnea, nausea, vomiting, dizziness, syncope, edema, weight gain, or early satiety. {He/she (caps):30048} has not had ICD shocks.   Device History: Environmental manager S-ICD ICD implanted NICM for 01/10/2017  Past Medical History:  Diagnosis Date   CHF (congestive heart failure) (HCC)    Cough    a. PFTS 09/2012: restriction probable, further examinations recommended. Saw pulm 11/2012: placed on GERD rx and short course prednisone, planned to regroup.   CVA (cerebral vascular accident) (HCC) 01/23/2013   tPA administered; MRI brain showed small acute R MCA infarct   Nonischemic cardiomyopathy (HCC)    a. Identified 10/2012 by echo EF 20-25%. b. Echo 01/2013: EF 20-25%, TEE EF 15% with severe RV dysfunction as well.   Paroxysmal atrial fibrillation (HCC)    PFO (patent foramen ovale)    a. By TEE 01/2013.   Past Surgical History:  Procedure Laterality Date   NO PAST SURGERIES     SUBQ ICD IMPLANT N/A 01/10/2017   Procedure: SubQ ICD Implant;  Surgeon: Duke Salvia, MD;  Location: [facility] INVASIVE CV LAB;  Service: Cardiovascular;  Laterality: N/A;   TEE WITHOUT CARDIOVERSION N/A 01/26/2013   Procedure: TRANSESOPHAGEAL ECHOCARDIOGRAM (TEE);  Surgeon: [name], MD;  Location: [facility] ENDOSCOPY;  Service: Cardiovascular;  Laterality: N/A;    Current Outpatient Medications  Medication Sig Dispense Refill   apixaban (ELIQUIS) 5 MG TABS tablet Take 1 tablet (5 mg total) by mouth 2 (two) times daily. 60 tablet 11   atorvastatin (LIPITOR) 10 MG  tablet Take 1 tablet (10 mg total) by mouth daily at 6 PM. 90 tablet 3   benzonatate (TESSALON) 100 MG capsule Take 1 capsule (100 mg total) by mouth every 8 (eight) hours. 21 capsule 0   carvedilol (COREG) 25 MG tablet Take 1 tablet (25 mg total) by mouth 2 (two) times daily with a meal. 180 tablet 0   metroNIDAZOLE (FLAGYL) 500 MG tablet Take 1 tablet (500 mg total) by mouth 2 (two) times daily. 14 tablet 0   ondansetron (ZOFRAN ODT) 8 MG disintegrating tablet Take 1 tablet (8 mg total) by mouth every 8 (eight) hours as needed for nausea or vomiting. 20 tablet 0   promethazine-dextromethorphan (PROMETHAZINE-DM) 6.25-15 MG/5ML syrup Take 5 mLs by mouth 4 (four) times daily as needed for cough. 118 mL 0   sacubitril-valsartan (ENTRESTO) 49-51 MG Take 1 tablet by mouth 2 (two) times daily. 180 tablet 0   spironolactone (ALDACTONE) 25 MG tablet Take 1 tablet (25 mg total) by mouth daily. 90 tablet 3   No current facility-administered medications for this visit.    Allergies:   Patient has no known allergies.   Social History: Social History   Socioeconomic History   Marital status: Married    Spouse name: Not on file   Number of children: Not on file   Years of education: Not on file   Highest education level: Not on file  Occupational History   Not on file  Tobacco Use   Smoking status: Never   Smokeless tobacco: Never  Vaping Use   Vaping Use: Never used  Substance and Sexual Activity   Alcohol use: Not Currently    Comment: occ   Drug use: No   Sexual activity: Yes  Other Topics Concern   Not on file  Social History Narrative   Marital status: single     Children: 4 children (41, 20, 2, 48month old)      Lives: with girlfriend in [place].      Employment: works for city of [employer] exposure      Exercise: basketball every Friday night.   Social Determinants of Health   Financial Resource Strain: Not on file  Food Insecurity: Not on file  Transportation  Needs: Not on file  Physical Activity: Not on file  Stress: Not on file  Social Connections: Not on file  Intimate Partner Violence: Not on file    Family History: Family History  Problem Relation Age of Onset   Sarcoidosis Mother    Heart Problems Mother        PPM implantation age 66    Review of Systems: All other systems reviewed and are otherwise negative except as noted above.   Physical Exam: There were no vitals filed for this visit.   GEN- The patient is well appearing, alert and oriented x 3 today.   HEENT: normocephalic, atraumatic; sclera clear, conjunctiva pink; hearing intact; oropharynx clear; neck supple, no JVP Lymph- no cervical lymphadenopathy Lungs- Clear to ausculation bilaterally, normal work of breathing.  No wheezes, rales, rhonchi Heart- Regular rate and rhythm, no murmurs, rubs or gallops, PMI not laterally displaced GI- soft, non-tender, non-distended, bowel sounds present, no hepatosplenomegaly Extremities- no clubbing or cyanosis. No edema; DP/PT/radial pulses 2+ bilaterally MS- no significant deformity or atrophy Skin- warm and dry, no rash or lesion; ICD pocket well healed Psych- euthymic mood, full affect Neuro- strength and sensation are intact  ICD interrogation- reviewed in detail today,  See PACEART report  EKG:  EKG {ACTION; IS/IS PJK:[id number]} ordered today. Personal review of EKG ordered {Blank single:19197::"today","***"} shows  Recent Labs: No results found for requested labs within last 8760 hours.   Wt Readings from Last 3 Encounters:  02/18/21 274 lb (124.3 kg)  01/22/21 264 lb 8.8 oz (120 kg)  10/12/19 266 lb (120.7 kg)     Other studies Reviewed: Additional studies/ records that were reviewed today include: ***   Assessment and Plan:  1.  Chronic systolic dysfunction s/p Architect  euvolemic today Stable on an appropriate medical regimen Normal ICD function See Pace Art report No changes today  2.  AFib Continue coreg and Eliquis. CHA2DS2-VASc is at least 3.  3. Sleep disordered breathing Pt denied twice for split night. He was scheduled for a HST but cancelled and did not reschedule.  Current medicines are reviewed at length with the patient today.   The patient {ACTIONS; HAS/DOES NOT HAVE:19233} concerns regarding his medicines.  The following changes were made today:  {NONE DEFAULTED:18576}  Labs/ tests ordered today include: *** No orders of the defined types were placed in this encounter.    Disposition:   Follow up with {Blank single:19197::"Dr. Allred","Dr. Amada Jupiter. Klein","Dr. Camnitz","Dr. Lambert","EP APP"}  {gen number 5-80:998338} {TIME; UNITS DAY/WEEK/MONTH:19136}   Signed, Graciella Freer, PA-C  03/07/2021 8:31 AM  [facility] [address] [place] [address] [phone] (office) [phone] (fax)

## 2021-03-12 DIAGNOSIS — N341 Nonspecific urethritis: Secondary | ICD-10-CM | POA: Diagnosis not present

## 2021-04-06 DIAGNOSIS — E291 Testicular hypofunction: Secondary | ICD-10-CM | POA: Diagnosis not present

## 2021-05-08 ENCOUNTER — Ambulatory Visit (INDEPENDENT_AMBULATORY_CARE_PROVIDER_SITE_OTHER): Payer: BLUE CROSS/BLUE SHIELD

## 2021-05-08 DIAGNOSIS — I428 Other cardiomyopathies: Secondary | ICD-10-CM | POA: Diagnosis not present

## 2021-05-09 ENCOUNTER — Ambulatory Visit: Payer: BLUE CROSS/BLUE SHIELD | Admitting: Cardiovascular Disease

## 2021-05-09 LAB — CUP PACEART REMOTE DEVICE CHECK
Battery Remaining Percentage: 52 %
Date Time Interrogation Session: 20221101195500
Implantable Lead Implant Date: 20180706
Implantable Lead Location: 753862
Implantable Lead Model: 3401
Implantable Lead Serial Number: 114025
Implantable Pulse Generator Implant Date: 20180706
Pulse Gen Serial Number: 228369

## 2021-05-15 ENCOUNTER — Other Ambulatory Visit: Payer: Self-pay

## 2021-05-15 ENCOUNTER — Ambulatory Visit
Admission: EM | Admit: 2021-05-15 | Discharge: 2021-05-15 | Disposition: A | Discharge status: Home / Self Care | Payer: BLUE CROSS/BLUE SHIELD | Attending: Physician Assistant | Admitting: Physician Assistant

## 2021-05-15 DIAGNOSIS — J101 Influenza due to other identified influenza virus with other respiratory manifestations: Secondary | ICD-10-CM | POA: Diagnosis not present

## 2021-05-15 LAB — POCT INFLUENZA A/B
Influenza A, POC: POSITIVE — AB
Influenza B, POC: NEGATIVE

## 2021-05-15 MED ORDER — OSELTAMIVIR PHOSPHATE 75 MG PO CAPS: 75.0000 mg | ORAL_CAPSULE | Freq: Two times a day (BID) | ORAL | 0 refills | Status: DC

## 2021-05-15 NOTE — ED Provider Notes (Signed)
[name]    CSN: [id number] Arrival date & time: 05/15/21  1338      History   Chief Complaint Chief Complaint  Patient presents with   Cough   Otalgia    HPI Nathan Baird is a 40 y.o. male.   Patient here today for evaluation of congestion, cough, fatigue and ear pain that started 2 days ago.  He has had some diaphoresis as well.  He has tried multiple over-the-counter medications with mild relief but no resolution.  The history is provided by the patient.  Cough Associated symptoms: chills, diaphoresis, ear pain and sore throat   Associated symptoms: no eye discharge, no fever and no shortness of breath   Otalgia Associated symptoms: congestion, cough and sore throat   Associated symptoms: no abdominal pain, no fever and no vomiting    Past Medical History:  Diagnosis Date   CHF (congestive heart failure) (HCC)    Cough    a. PFTS 09/2012: restriction probable, further examinations recommended. Saw pulm 11/2012: placed on GERD rx and short course prednisone, planned to regroup.   CVA (cerebral vascular accident) (HCC) 01/23/2013   tPA administered; MRI brain showed small acute R MCA infarct   Nonischemic cardiomyopathy (HCC)    a. Identified 10/2012 by echo EF 20-25%. b. Echo 01/2013: EF 20-25%, TEE EF 15% with severe RV dysfunction as well.   Paroxysmal atrial fibrillation (HCC)    PFO (patent foramen ovale)    a. By TEE 01/2013.    Patient Active Problem List   Diagnosis Date Noted   ICD (implantable cardioverter-defibrillator) in place 10/11/2019   Chest pain 02/19/2018   NICM (nonischemic cardiomyopathy) (HCC) 01/10/2017   Trapezius muscle strain 11/24/2014   Right shoulder pain 08/24/2014   TIA (transient ischemic attack) 05/20/2014   History of CVA (cerebrovascular accident)    Essential hypertension    HLD (hyperlipidemia)    Encounter for long-term (current) use of high-risk medication 01/27/2013   Patent foramen ovale with right to left  shunt 01/27/2013   Stroke, acute, embolic (HCC) 01/25/2013   Cough 10/20/2012   Chronic systolic congestive heart failure (HCC) 10/20/2012   Penile pain 05/19/2012   ATTENTION DEFICIT, W/HYPERACTIVITY 09/04/2006    Past Surgical History:  Procedure Laterality Date   NO PAST SURGERIES     SUBQ ICD IMPLANT N/A 01/10/2017   Procedure: SubQ ICD Implant;  Surgeon: Duke Salvia, MD;  Location: [facility] INVASIVE CV LAB;  Service: Cardiovascular;  Laterality: N/A;   TEE WITHOUT CARDIOVERSION N/A 01/26/2013   Procedure: TRANSESOPHAGEAL ECHOCARDIOGRAM (TEE);  Surgeon: [name], MD;  Location: [facility] ENDOSCOPY;  Service: Cardiovascular;  Laterality: N/A;       Home Medications    Prior to Admission medications   Medication Sig Start Date End Date Taking? Authorizing Provider  oseltamivir (TAMIFLU) 75 MG capsule Take 1 capsule (75 mg total) by mouth every 12 (twelve) hours. 05/15/21  Yes Tomi Bamberger, PA-C  apixaban (ELIQUIS) 5 MG TABS tablet Take 1 tablet (5 mg total) by mouth 2 (two) times daily. 07/02/18   Duke Salvia, MD  atorvastatin (LIPITOR) 10 MG tablet Take 1 tablet (10 mg total) by mouth daily at 6 PM. 10/24/16   Nahser, Deloris Ping, MD  benzonatate (TESSALON) 100 MG capsule Take 1 capsule (100 mg total) by mouth every 8 (eight) hours. 01/16/21   Rhys Martini, PA-C  carvedilol (COREG) 25 MG tablet Take 1 tablet (25 mg total) by mouth 2 (  two) times daily with a meal. 02/28/21   Deboraha Sprang, MD  metroNIDAZOLE (FLAGYL) 500 MG tablet Take 1 tablet (500 mg total) by mouth 2 (two) times daily. 10/27/20   Volney American, PA-C  ondansetron (ZOFRAN ODT) 8 MG disintegrating tablet Take 1 tablet (8 mg total) by mouth every 8 (eight) hours as needed for nausea or vomiting. 01/16/21   Hazel Sams, PA-C  promethazine-dextromethorphan (PROMETHAZINE-DM) 6.25-15 MG/5ML syrup Take 5 mLs by mouth 4 (four) times daily as needed for cough. 01/16/21   Hazel Sams, PA-C   sacubitril-valsartan (ENTRESTO) 49-51 MG Take 1 tablet by mouth 2 (two) times daily. 02/28/21   Nahser, Wonda Cheng, MD  spironolactone (ALDACTONE) 25 MG tablet Take 1 tablet (25 mg total) by mouth daily. 09/14/19   Nahser, Wonda Cheng, MD    Family History Family History  Problem Relation Age of Onset   Sarcoidosis Mother    Heart Problems Mother        PPM implantation age 89    Social History Social History   Tobacco Use   Smoking status: Never   Smokeless tobacco: Never  Vaping Use   Vaping Use: Never used  Substance Use Topics   Alcohol use: Not Currently    Comment: occ   Drug use: No     Allergies   Patient has no known allergies.   Review of Systems Review of Systems  Constitutional:  Positive for chills and diaphoresis. Negative for fever.  HENT:  Positive for congestion, ear pain and sore throat.   Eyes:  Negative for discharge and redness.  Respiratory:  Positive for cough. Negative for shortness of breath.   Gastrointestinal:  Negative for abdominal pain, nausea and vomiting.    Physical Exam Triage Vital Signs ED Triage Vitals  Enc Vitals Group     BP 05/15/21 1523 122/87     Pulse Rate 05/15/21 1523 81     Resp 05/15/21 1523 18     Temp 05/15/21 1523 98.3 F (36.8 C)     Temp Source 05/15/21 1523 Oral     SpO2 05/15/21 1523 96 %     Weight --      Height --      Head Circumference --      Peak Flow --      Pain Score 05/15/21 1525 4     Pain Loc --      Pain Edu? --      Excl. in [place]? --    No data found.  Updated Vital Signs BP 122/87 (BP Location: Left Arm)   Pulse 81   Temp 98.3 F (36.8 C) (Oral)   Resp 18   SpO2 96%      Physical Exam Vitals and nursing note reviewed.  Constitutional:      General: He is not in acute distress.    Appearance: Normal appearance. He is not ill-appearing.  HENT:     Head: Normocephalic and atraumatic.     Right Ear: Tympanic membrane normal.     Left Ear: Tympanic membrane normal.     Nose:  Congestion present.     Mouth/Throat:     Mouth: Mucous membranes are moist.     Pharynx: Oropharynx is clear. No oropharyngeal exudate or posterior oropharyngeal erythema.  Eyes:     Conjunctiva/sclera: Conjunctivae normal.  Cardiovascular:     Rate and Rhythm: Normal rate and regular rhythm.     Heart sounds: Normal heart sounds. No  murmur heard. Pulmonary:     Effort: Pulmonary effort is normal. No respiratory distress.     Breath sounds: Normal breath sounds. No wheezing, rhonchi or rales.  Skin:    General: Skin is warm and dry.  Neurological:     Mental Status: He is alert.  Psychiatric:        Mood and Affect: Mood normal.        Thought Content: Thought content normal.     UC Treatments / Results  Labs (all labs ordered are listed, but only abnormal results are displayed) Labs Reviewed  POCT INFLUENZA A/B - Abnormal; Notable for the following components:      Result Value   Influenza A, POC Positive (*)    All other components within normal limits    EKG   Radiology No results found.  Procedures Procedures (including critical care time)  Medications Ordered in UC Medications - No data to display  Initial Impression / Assessment and Plan / UC Course  I have reviewed the triage vital signs and the nursing notes.  Pertinent labs & imaging results that were available during my care of the patient were reviewed by me and considered in my medical decision making (see chart for details).  Flu test positive.  Will treat with Tamiflu.  Encouraged symptomatic treatment otherwise.  Recommended follow-up if symptoms fail to improve or worsen.  Final Clinical Impressions(s) / UC Diagnoses   Final diagnoses:  Influenza A   Discharge Instructions   None    ED Prescriptions     Medication Sig Dispense Auth. Provider   oseltamivir (TAMIFLU) 75 MG capsule Take 1 capsule (75 mg total) by mouth every 12 (twelve) hours. 10 capsule Francene Finders, PA-C      PDMP  not reviewed this encounter.   Francene Finders, PA-C 05/15/21 1710

## 2021-05-15 NOTE — ED Triage Notes (Signed)
2 day h/o HA, sweats, bilateral ear aches, cough, fatigue, watery eyes and congestion. Has been taking ibuprofen, Formula 44 and Dayquil with some relief. Pt is covid vaccinated.

## 2021-05-15 NOTE — Progress Notes (Signed)
Remote ICD transmission.   

## 2021-06-06 ENCOUNTER — Other Ambulatory Visit: Payer: Self-pay

## 2021-06-06 DIAGNOSIS — I4891 Unspecified atrial fibrillation: Secondary | ICD-10-CM

## 2021-06-06 DIAGNOSIS — I428 Other cardiomyopathies: Secondary | ICD-10-CM

## 2021-06-06 DIAGNOSIS — R0683 Snoring: Secondary | ICD-10-CM

## 2021-06-06 DIAGNOSIS — I5022 Chronic systolic (congestive) heart failure: Secondary | ICD-10-CM

## 2021-06-06 DIAGNOSIS — Z95810 Presence of automatic (implantable) cardiac defibrillator: Secondary | ICD-10-CM

## 2021-06-06 MED ORDER — CARVEDILOL 25 MG PO TABS: 25.0000 mg | ORAL_TABLET | Freq: Two times a day (BID) | ORAL | 0 refills | Status: DC

## 2021-06-11 DIAGNOSIS — R369 Urethral discharge, unspecified: Secondary | ICD-10-CM | POA: Diagnosis not present

## 2021-08-07 ENCOUNTER — Ambulatory Visit (INDEPENDENT_AMBULATORY_CARE_PROVIDER_SITE_OTHER): Payer: BLUE CROSS/BLUE SHIELD

## 2021-08-07 DIAGNOSIS — I428 Other cardiomyopathies: Secondary | ICD-10-CM | POA: Diagnosis not present

## 2021-08-08 ENCOUNTER — Telehealth: Payer: Self-pay | Admitting: Cardiovascular Disease

## 2021-08-08 ENCOUNTER — Other Ambulatory Visit: Payer: Self-pay | Admitting: Cardiovascular Disease

## 2021-08-08 DIAGNOSIS — I5022 Chronic systolic (congestive) heart failure: Secondary | ICD-10-CM

## 2021-08-08 DIAGNOSIS — R0683 Snoring: Secondary | ICD-10-CM

## 2021-08-08 DIAGNOSIS — Z95810 Presence of automatic (implantable) cardiac defibrillator: Secondary | ICD-10-CM

## 2021-08-08 DIAGNOSIS — I428 Other cardiomyopathies: Secondary | ICD-10-CM

## 2021-08-08 DIAGNOSIS — I4891 Unspecified atrial fibrillation: Secondary | ICD-10-CM

## 2021-08-08 DIAGNOSIS — Z8673 Personal history of transient ischemic attack (TIA), and cerebral infarction without residual deficits: Secondary | ICD-10-CM

## 2021-08-08 LAB — CUP PACEART REMOTE DEVICE CHECK
Battery Remaining Percentage: 48 %
Date Time Interrogation Session: 20230201144600
Implantable Lead Implant Date: 20180706
Implantable Lead Location: 753862
Implantable Lead Model: 3401
Implantable Lead Serial Number: 114025
Implantable Pulse Generator Implant Date: 20180706
Pulse Gen Serial Number: 228369

## 2021-08-08 MED ORDER — CARVEDILOL 25 MG PO TABS: 25.0000 mg | ORAL_TABLET | Freq: Two times a day (BID) | ORAL | 1 refills | Status: DC

## 2021-08-08 MED ORDER — ENTRESTO 49-51 MG PO TABS: 1.0000 | ORAL_TABLET | Freq: Two times a day (BID) | ORAL | 1 refills | Status: DC

## 2021-08-08 NOTE — Telephone Encounter (Signed)
° ° °*  STAT* If patient is at the pharmacy, call can be transferred to refill team.   1. Which medications need to be refilled? (please list name of each medication and dose if known)   carvedilol (COREG) 25 MG tablet  sacubitril-valsartan (ENTRESTO) 49-51 MG  2. Which pharmacy/location (including street and city if local pharmacy) is medication to be sent to? [facility] [phone] - [place], [place] - [address] AT [place]  3. Do they need a 30 day or 90 day supply? 90 days  Pt made an appt on 09/18/21

## 2021-08-08 NOTE — Telephone Encounter (Signed)
Called pt and left message informing pt that his medications were sent to his pharmacy as 30 day with 1 refill, enough medications to get pt to his appointment, because pt has not been seen since 2021. I advised pt that if he has any other problems, questions or concerns, to give our office a call back.

## 2021-08-15 NOTE — Progress Notes (Signed)
Remote ICD transmission.   

## 2021-09-18 ENCOUNTER — Other Ambulatory Visit: Payer: Self-pay

## 2021-09-18 ENCOUNTER — Encounter: Payer: Self-pay | Admitting: Internal Medicine

## 2021-09-18 ENCOUNTER — Ambulatory Visit (INDEPENDENT_AMBULATORY_CARE_PROVIDER_SITE_OTHER): Payer: BLUE CROSS/BLUE SHIELD | Admitting: Internal Medicine

## 2021-09-18 VITALS — BP 130/82 | HR 75 | Ht 75.0 in | Wt 286.0 lb

## 2021-09-18 DIAGNOSIS — I428 Other cardiomyopathies: Secondary | ICD-10-CM | POA: Diagnosis not present

## 2021-09-18 DIAGNOSIS — Z95810 Presence of automatic (implantable) cardiac defibrillator: Secondary | ICD-10-CM

## 2021-09-18 DIAGNOSIS — I480 Paroxysmal atrial fibrillation: Secondary | ICD-10-CM

## 2021-09-18 DIAGNOSIS — Z79899 Other long term (current) drug therapy: Secondary | ICD-10-CM

## 2021-09-18 DIAGNOSIS — R0683 Snoring: Secondary | ICD-10-CM

## 2021-09-18 DIAGNOSIS — I5022 Chronic systolic (congestive) heart failure: Secondary | ICD-10-CM

## 2021-09-18 DIAGNOSIS — Z8673 Personal history of transient ischemic attack (TIA), and cerebral infarction without residual deficits: Secondary | ICD-10-CM

## 2021-09-18 DIAGNOSIS — I4891 Unspecified atrial fibrillation: Secondary | ICD-10-CM

## 2021-09-18 MED ORDER — ENTRESTO 49-51 MG PO TABS: 1.0000 | ORAL_TABLET | Freq: Two times a day (BID) | ORAL | 1 refills | Status: DC

## 2021-09-18 MED ORDER — SPIRONOLACTONE 25 MG PO TABS: 25.0000 mg | ORAL_TABLET | Freq: Every day | ORAL | 1 refills | Status: DC

## 2021-09-18 MED ORDER — CARVEDILOL 25 MG PO TABS: 25.0000 mg | ORAL_TABLET | Freq: Two times a day (BID) | ORAL | 1 refills | Status: DC

## 2021-09-18 NOTE — Progress Notes (Signed)
? ? ? ? ?Patient Care Team: ?Patient, No Pcp Per (Inactive) as PCP - General (General Practice) ?Duke Salvia, MD as PCP - Electrophysiology (Cardiology) ?Nahser, Deloris Ping, MD as Attending Physician (Cardiology) ? ? ?HPI ? ?Nathan Baird is a 41 y.o. male ?Seen in follow-up for ICD implanted for primary prevention in the setting of nonischemic cardiomyopathy since [name] dysfunction and modest class II heart failure. ? ?He has a history of a prior TIA currently is treated with aspirin. ? ?Interval device interrogation has demonstrated atrial fibrillation.  Was quite symptomatic  Seen by Dr Fawn Kirk 6/21 and elected to pursue lifestyle modifications.  Sleep study prequalification was denied ? ?Interval atrial fibrillation 8/22 about 48 hours duration ? ?Has stopped many of his meds ? ?Working part time at 2 jobs.  Daytime somnolence sleep disordered breathing ?  ?Date Cr K  Hgb   Mg  ?6/18  1.15 4.0     ? 8/19 1.21 3.7    ?3/21 1.03 4.2 17.4 2.2  ?      ? ?DATE TEST EF   ?8/14 cMRI  26  %   ?4/16 Echo   25-30 %   ?5/18 Echo  20-25%   ?5/21 Echo  35-40%   ?  ? ?Thromboembolic risk factors ( , HTN-1, TIA/CVA-2, CHF-1) for a CHADSVASc Score of 4 ?  ?  ? ?Past Medical History:  ?Diagnosis Date  ? CHF (congestive heart failure) (HCC)   ? Cough   ? a. PFTS 09/2012: restriction probable, further examinations recommended. Saw pulm 11/2012: placed on GERD rx and short course prednisone, planned to regroup.  ? CVA (cerebral vascular accident) (HCC) 01/23/2013  ? tPA administered; MRI brain showed small acute R MCA infarct  ? Nonischemic cardiomyopathy (HCC)   ? a. Identified 10/2012 by echo EF 20-25%. b. Echo 01/2013: EF 20-25%, TEE EF 15% with severe RV dysfunction as well.  ? Paroxysmal atrial fibrillation (HCC)   ? PFO (patent foramen ovale)   ? a. By TEE 01/2013.  ? ? ?Past Surgical History:  ?Procedure Laterality Date  ? NO PAST SURGERIES    ? SUBQ ICD IMPLANT N/A 01/10/2017  ? Procedure: SubQ ICD Implant;  Surgeon: Duke Salvia, MD;  Location: [facility] INVASIVE CV LAB;  Service: Cardiovascular;  Laterality: N/A;  ? TEE WITHOUT CARDIOVERSION N/A 01/26/2013  ? Procedure: TRANSESOPHAGEAL ECHOCARDIOGRAM (TEE);  Surgeon: [name], MD;  Location: [facility] ENDOSCOPY;  Service: Cardiovascular;  Laterality: N/A;  ? ? ?Current Outpatient Medications  ?Medication Sig Dispense Refill  ? apixaban (ELIQUIS) 5 MG TABS tablet Take 1 tablet (5 mg total) by mouth 2 (two) times daily. 60 tablet 11  ? atorvastatin (LIPITOR) 10 MG tablet Take 1 tablet (10 mg total) by mouth daily at 6 PM. 90 tablet 3  ? benzonatate (TESSALON) 100 MG capsule Take 1 capsule (100 mg total) by mouth every 8 (eight) hours. 21 capsule 0  ? carvedilol (COREG) 25 MG tablet Take 1 tablet (25 mg total) by mouth 2 (two) times daily with a meal. 30 tablet 1  ? metroNIDAZOLE (FLAGYL) 500 MG tablet Take 1 tablet (500 mg total) by mouth 2 (two) times daily. 14 tablet 0  ? ondansetron (ZOFRAN ODT) 8 MG disintegrating tablet Take 1 tablet (8 mg total) by mouth every 8 (eight) hours as needed for nausea or vomiting. 20 tablet 0  ? oseltamivir (TAMIFLU) 75 MG capsule Take 1 capsule (75 mg total) by mouth every 12 (  twelve) hours. 10 capsule 0  ? promethazine-dextromethorphan (PROMETHAZINE-DM) 6.25-15 MG/5ML syrup Take 5 mLs by mouth 4 (four) times daily as needed for cough. 118 mL 0  ? sacubitril-valsartan (ENTRESTO) 49-51 MG Take 1 tablet by mouth 2 (two) times daily. 60 tablet 1  ? spironolactone (ALDACTONE) 25 MG tablet Take 1 tablet (25 mg total) by mouth daily. 90 tablet 3  ? ?No current facility-administered medications for this visit.  ? ? ?No Known Allergies ? ? ? ?Review of Systems negative except from HPI and PMH ? ?Physical Exam ?BP 130/82   Pulse 75   Ht 6\' 3"  (1.905 m)   Wt 286 lb (129.7 kg)   SpO2 96%   BMI 35.75 kg/m?   ?Well developed and well nourished in no acute distress ?HENT normal ?Neck supple with JVP-flat ?Clear ?Device pocket well healed; without hematoma or erythema.   There is no tethering  ?Regular rate and rhythm, no  murmur ?Abd-soft with active BS ?No Clubbing cyanosis no edema ?Skin-warm and dry ?A & Oriented  Grossly normal sensory and motor function ? ?ECG sinus at 75 ?Interval 17/10/36 ?T wave inversions inferolaterally ? ? ? ?Assessment and  Plan ? ?NICM ? ?SubQ ICD The patient's device was interrogated.  The information was reviewed. No changes were made in the programming.    ? ?AFib ? ?TIA ? ?Class I recall-generator and lead ? ?His S ICD leads remain on grade 1 recall. ? ?He has had interval atrial fibrillation.  He is discontinued his Eliquis.  We reviewed thromboembolic risk and he is not interested in continuing not withstanding his CHADSVAS score of 4 and TE risk of about 5 % /yr  ? ?With his cardiomyopathy, we will continue him on Entresto and carvedilol.  His blood pressure is elevated, but rather than raise his 19/10/36 he is agreed to resume spironolactone.  We will need to check a metabolic profile in 2 weeks.  We will see him in 6 weeks. ?  ?

## 2021-09-18 NOTE — Patient Instructions (Addendum)
Medication Instructions:  ?Your physician recommends that you continue on your current medications as directed. Please refer to the Current Medication list given to you today. ? ?*If you need a refill on your cardiac medications before your next appointment, please call your pharmacy* ? ? ?Lab Work: ?BMET in 2 weeks 10/03/2021 - you may come anytime between 8am and 430pm.  You do not have to be fasting. ? ?If you have labs (blood work) drawn today and your tests are completely normal, you will receive your results only by: ?MyChart Message (if you have MyChart) OR ?A paper copy in the mail ?If you have any lab test that is abnormal or we need to change your treatment, we will call you to review the results. ? ? ?Testing/Procedures: ?Your physician has recommended that you have a sleep study. This test records several body functions during sleep, including: brain activity, eye movement, oxygen and carbon dioxide blood levels, heart rate and rhythm, breathing rate and rhythm, the flow of air through your mouth and nose, snoring, body muscle movements, and chest and belly movement.  ? ? ?Follow-Up: ?At [facility], you and your health needs are our priority.  As part of our continuing mission to provide you with exceptional heart care, we have created designated Provider Care Teams.  These Care Teams include your primary Cardiologist (physician) and Advanced Practice Providers (APPs -  Physician Assistants and Nurse Practitioners) who all work together to provide you with the care you need, when you need it. ? ?We recommend signing up for the patient portal called "MyChart".  Sign up information is provided on this After Visit Summary.  MyChart is used to connect with patients for Virtual Visits (Telemedicine).  Patients are able to view lab/test results, encounter notes, upcoming appointments, etc.  Non-urgent messages can be sent to your provider as well.   ?To learn more about what you can do with MyChart, go to  [email or web address].   ? ?Your next appointment:   ?6 month(s) ? ?The format for your next appointment:   ?In Person ? ?Provider:   ?Virl Axe, MD{ ? ? ?

## 2021-09-19 ENCOUNTER — Other Ambulatory Visit (INDEPENDENT_AMBULATORY_CARE_PROVIDER_SITE_OTHER): Payer: BLUE CROSS/BLUE SHIELD

## 2021-09-19 DIAGNOSIS — I480 Paroxysmal atrial fibrillation: Secondary | ICD-10-CM

## 2021-10-01 ENCOUNTER — Telehealth: Payer: Self-pay | Admitting: *Deleted

## 2021-10-01 NOTE — Telephone Encounter (Signed)
Informed patient of upcoming home sleep study and patient understanding was verbalized.  ?Patient understands her/his HST is scheduled for 11/12/21 @ 2:30. ?Pt is aware of testing date.  ?

## 2021-10-03 ENCOUNTER — Other Ambulatory Visit: Payer: BLUE CROSS/BLUE SHIELD

## 2021-10-08 ENCOUNTER — Ambulatory Visit: Payer: BLUE CROSS/BLUE SHIELD | Admitting: Internal Medicine

## 2021-10-23 DIAGNOSIS — R300 Dysuria: Secondary | ICD-10-CM | POA: Diagnosis not present

## 2021-10-23 DIAGNOSIS — Z7251 High risk heterosexual behavior: Secondary | ICD-10-CM | POA: Diagnosis not present

## 2021-11-12 ENCOUNTER — Ambulatory Visit (HOSPITAL_BASED_OUTPATIENT_CLINIC_OR_DEPARTMENT_OTHER): Payer: BLUE CROSS/BLUE SHIELD | Attending: Internal Medicine | Admitting: Cardiology

## 2021-11-12 DIAGNOSIS — A64 Unspecified sexually transmitted disease: Secondary | ICD-10-CM | POA: Diagnosis not present

## 2021-11-12 DIAGNOSIS — Z202 Contact with and (suspected) exposure to infections with a predominantly sexual mode of transmission: Secondary | ICD-10-CM | POA: Diagnosis not present

## 2021-11-14 ENCOUNTER — Ambulatory Visit (INDEPENDENT_AMBULATORY_CARE_PROVIDER_SITE_OTHER): Payer: BLUE CROSS/BLUE SHIELD

## 2021-11-14 DIAGNOSIS — I428 Other cardiomyopathies: Secondary | ICD-10-CM | POA: Diagnosis not present

## 2021-11-15 LAB — CUP PACEART REMOTE DEVICE CHECK
Battery Remaining Percentage: 46 %
Date Time Interrogation Session: 20230510011300
Implantable Lead Implant Date: 20180706
Implantable Lead Location: 753862
Implantable Lead Model: 3401
Implantable Lead Serial Number: 114025
Implantable Pulse Generator Implant Date: 20180706
Pulse Gen Serial Number: 228369

## 2021-11-26 NOTE — Progress Notes (Signed)
Remote ICD transmission.   

## 2021-12-10 ENCOUNTER — Ambulatory Visit
Admission: EM | Admit: 2021-12-10 | Discharge: 2021-12-10 | Disposition: A | Discharge status: Home / Self Care | Payer: BLUE CROSS/BLUE SHIELD | Attending: Nurse Practitioner | Admitting: Nurse Practitioner

## 2021-12-10 ENCOUNTER — Encounter: Payer: Self-pay | Admitting: Emergency Medicine

## 2021-12-10 DIAGNOSIS — Z113 Encounter for screening for infections with a predominantly sexual mode of transmission: Secondary | ICD-10-CM | POA: Insufficient documentation

## 2021-12-10 DIAGNOSIS — Z202 Contact with and (suspected) exposure to infections with a predominantly sexual mode of transmission: Secondary | ICD-10-CM

## 2021-12-10 MED ORDER — CEFTRIAXONE SODIUM 1 G IJ SOLR
1.0000 g | Freq: Once | INTRAMUSCULAR | Status: AC
  Administered 2021-12-10: 1 g via INTRAMUSCULAR

## 2021-12-10 NOTE — ED Triage Notes (Signed)
Pt here with irritation at the head of his penis x 2 days. No discharge. Pt had unprotected sex with new partner a week ago.

## 2021-12-10 NOTE — Discharge Instructions (Addendum)
You have been seen today for STD screening.  You have elected to receive an injection of antibiotics prophylactically even though we do not know any results at this time.  If any of your pending tests is positive then you may need additional medications.   Testing for gonorrhea, chlamydia and trichomonas is pending. You should not have any sexual activity until you receive the results of the tests. You will only be notified for positive results. You may go online to MyChart and review your results. Practice safe sex practices by wearing a condom every time you have sex. Remember that people who have STIs may not experience any symptoms. However, even without symptoms, these infections can be spread from person to person and require treatment. STIs can be treated, and many STIs can be cured. However, some STIs cannot be cured and will affect you for the rest of your life. It's important to be checked regularly for STIs. You should also consider taking pre-exposure prophylaxis (PrEP) to prevent HIV infection.

## 2021-12-10 NOTE — ED Provider Notes (Signed)
[facility]    CSN: [id number] Arrival date & time: 12/10/21  1637      History   Chief Complaint Chief Complaint  Patient presents with   STI check    HPI Nathan Baird is a 41 y.o. male.   Subjective:  Nathan Baird is a 41 y.o. male who complains of a tingling/uncomfortable sensation in his penis for the past couple of days.  He denies any dysuria or penile discharge.  He is concerned for STDs and would like to be checked.  He does have a history of chlamydia in the past.  He is currently sexually active with 1 male partner and does not use condoms.    The following portions of the patient's history were reviewed and updated as appropriate: allergies, current medications, past family history, past medical history, past social history, past surgical history, and problem list.      Past Medical History:  Diagnosis Date   CHF (congestive heart failure) (HCC)    Cough    a. PFTS 09/2012: restriction probable, further examinations recommended. Saw pulm 11/2012: placed on GERD rx and short course prednisone, planned to regroup.   CVA (cerebral vascular accident) ([place]) 01/23/2013   tPA administered; MRI brain showed small acute R MCA infarct   Nonischemic cardiomyopathy ([place])    a. Identified 10/2012 by echo EF 20-25%. b. Echo 01/2013: EF 20-25%, TEE EF 15% with severe RV dysfunction as well.   Paroxysmal atrial fibrillation (HCC)    PFO (patent foramen ovale)    a. By TEE 01/2013.    Patient Active Problem List   Diagnosis Date Noted   ICD (implantable cardioverter-defibrillator) in place 10/11/2019   Chest pain 02/19/2018   NICM (nonischemic cardiomyopathy) ([place]) 01/10/2017   Trapezius muscle strain 11/24/2014   Right shoulder pain 08/24/2014   TIA (transient ischemic attack) 05/20/2014   History of CVA (cerebrovascular accident)    Essential hypertension    HLD (hyperlipidemia)    Encounter for long-term (current) use of high-risk medication 01/27/2013    Patent foramen ovale with right to left shunt 01/27/2013   Stroke, acute, embolic ([place]) [id number]   Cough [id number]   Chronic systolic congestive heart failure ([place]) 10/20/2012   Penile pain 05/19/2012   ATTENTION DEFICIT, W/HYPERACTIVITY 09/04/2006    Past Surgical History:  Procedure Laterality Date   NO PAST SURGERIES     SUBQ ICD IMPLANT N/A 01/10/2017   Procedure: SubQ ICD Implant;  Surgeon: Deboraha Sprang, MD;  Location: [place] CV LAB;  Service: Cardiovascular;  Laterality: N/A;   TEE WITHOUT CARDIOVERSION N/A 01/26/2013   Procedure: TRANSESOPHAGEAL ECHOCARDIOGRAM (TEE);  Surgeon: Lelon Perla, MD;  Location: [facility] ENDOSCOPY;  Service: Cardiovascular;  Laterality: N/A;       Home Medications    Prior to Admission medications   Medication Sig Start Date End Date Taking? Authorizing Provider  carvedilol (COREG) 25 MG tablet Take 1 tablet (25 mg total) by mouth 2 (two) times daily with a meal. 09/18/21   Deboraha Sprang, MD  sacubitril-valsartan (ENTRESTO) 49-51 MG Take 1 tablet by mouth 2 (two) times daily. 09/18/21   Deboraha Sprang, MD  spironolactone (ALDACTONE) 25 MG tablet Take 1 tablet (25 mg total) by mouth daily. 09/18/21   Deboraha Sprang, MD    Family History Family History  Problem Relation Age of Onset   Sarcoidosis Mother    Heart Problems Mother        PPM implantation age  5    Social History Social History   Tobacco Use   Smoking status: Never   Smokeless tobacco: Never  Vaping Use   Vaping Use: Never used  Substance Use Topics   Alcohol use: Not Currently    Comment: occ   Drug use: No     Allergies   Patient has no known allergies.   Review of Systems Review of Systems  Genitourinary:  Negative for decreased urine volume, difficulty urinating, dysuria, enuresis, flank pain, frequency, genital sores, hematuria, penile discharge, penile pain, penile swelling, scrotal swelling, testicular pain and urgency.  All other systems reviewed  and are negative.   Physical Exam Triage Vital Signs ED Triage Vitals  Enc Vitals Group     BP 12/10/21 2010 112/79     Pulse Rate 12/10/21 2010 80     Resp 12/10/21 2010 20     Temp 12/10/21 2010 98.6 F (37 C)     Temp src --      SpO2 12/10/21 2010 98 %     Weight --      Height --      Head Circumference --      Peak Flow --      Pain Score 12/10/21 2009 4     Pain Loc --      Pain Edu? --      Excl. in [place]? --    No data found.  Updated Vital Signs BP 112/79   Pulse 80   Temp 98.6 F (37 C)   Resp 20   SpO2 98%   Visual Acuity Right Eye Distance:   Left Eye Distance:   Bilateral Distance:    Right Eye Near:   Left Eye Near:    Bilateral Near:     Physical Exam Vitals reviewed.  HENT:     Head: Normocephalic.  Cardiovascular:     Rate and Rhythm: Normal rate.  Pulmonary:     Effort: Pulmonary effort is normal.  Genitourinary:    Comments: Deferred, patient performed self swab for testing Musculoskeletal:        General: Normal range of motion.     Cervical back: Normal range of motion and neck supple.  Skin:    General: Skin is warm and dry.  Neurological:     General: No focal deficit present.     Mental Status: He is alert and oriented to person, place, and time.  Psychiatric:        Mood and Affect: Mood normal.        Behavior: Behavior normal.     UC Treatments / Results  Labs (all labs ordered are listed, but only abnormal results are displayed) Labs Reviewed  CYTOLOGY, (ORAL, ANAL, URETHRAL) ANCILLARY ONLY    EKG   Radiology No results found.  Procedures Procedures (including critical care time)  Medications Ordered in UC Medications  cefTRIAXone (ROCEPHIN) injection 1 g (1 g Intramuscular Given 12/10/21 2047)    Initial Impression / Assessment and Plan / UC Course  I have reviewed the triage vital signs and the nursing notes.  Pertinent labs & imaging results that were available during my care of the patient were  reviewed by me and considered in my medical decision making (see chart for details).    41 year old male presenting with a tingling/uncomfortable sensation in the penis without any dysuria or penile discharge.  STD swab pending.  Patient has elected to be given Rocephin empirically.  Advised him to abstain from  sexual encounters until results of pending testing has been received.  Safe sex practices discussed.  Today's evaluation has revealed no signs of a dangerous process. Discussed diagnosis with patient and/or guardian. Patient and/or guardian aware of their diagnosis, possible red flag symptoms to watch out for and need for close follow up. Patient and/or guardian understands verbal and written discharge instructions. Patient and/or guardian comfortable with plan and disposition.  Patient and/or guardian has a clear mental status at this time, good insight into illness (after discussion and teaching) and has clear judgment to make decisions regarding their care  Documentation was completed with the aid of voice recognition software. Transcription may contain typographical errors. Final Clinical Impressions(s) / UC Diagnoses   Final diagnoses:  Screening examination for STD (sexually transmitted disease)     Discharge Instructions      You have been seen today for STD screening.  You have elected to receive an injection of antibiotics prophylactically even though we do not know any results at this time.  If any of your pending tests is positive then you may need additional medications.   Testing for gonorrhea, chlamydia and trichomonas is pending. You should not have any sexual activity until you receive the results of the tests. You will only be notified for positive results. You may go online to [place] and review your results. Practice safe sex practices by wearing a condom every time you have sex. Remember that people who have STIs may not experience any symptoms. However, even without  symptoms, these infections can be spread from person to person and require treatment. STIs can be treated, and many STIs can be cured. However, some STIs cannot be cured and will affect you for the rest of your life. It's important to be checked regularly for STIs. You should also consider taking pre-exposure prophylaxis (PrEP) to prevent HIV infection.     ED Prescriptions   None    PDMP not reviewed this encounter.   Enrique Sack, [place] 12/10/21 2053

## 2021-12-11 DIAGNOSIS — Z113 Encounter for screening for infections with a predominantly sexual mode of transmission: Secondary | ICD-10-CM | POA: Diagnosis not present

## 2021-12-11 DIAGNOSIS — Z202 Contact with and (suspected) exposure to infections with a predominantly sexual mode of transmission: Secondary | ICD-10-CM | POA: Diagnosis not present

## 2021-12-12 LAB — CYTOLOGY, (ORAL, ANAL, URETHRAL) ANCILLARY ONLY
Chlamydia: NEGATIVE
Comment: NEGATIVE
Comment: NEGATIVE
Comment: NORMAL
Neisseria Gonorrhea: NEGATIVE
Trichomonas: NEGATIVE

## 2022-02-13 ENCOUNTER — Ambulatory Visit (INDEPENDENT_AMBULATORY_CARE_PROVIDER_SITE_OTHER): Payer: BLUE CROSS/BLUE SHIELD

## 2022-02-13 DIAGNOSIS — I428 Other cardiomyopathies: Secondary | ICD-10-CM

## 2022-02-14 LAB — CUP PACEART REMOTE DEVICE CHECK
Battery Remaining Percentage: 43 %
Date Time Interrogation Session: 20230809181800
Implantable Lead Implant Date: 20180706
Implantable Lead Location: 753862
Implantable Lead Model: 3401
Implantable Lead Serial Number: 114025
Implantable Pulse Generator Implant Date: 20180706
Pulse Gen Serial Number: 228369

## 2022-03-19 NOTE — Progress Notes (Signed)
Remote ICD transmission.   

## 2022-04-15 ENCOUNTER — Telehealth: Payer: Self-pay | Admitting: *Deleted

## 2022-04-15 ENCOUNTER — Encounter: Payer: Self-pay | Admitting: *Deleted

## 2022-04-15 NOTE — Telephone Encounter (Signed)
Letter has been sent to patient informing them that their home sleep study order has expired. Patient will need to call and schedule an office visit to re-evaluate the need for a sleep study.

## 2022-05-15 ENCOUNTER — Ambulatory Visit (INDEPENDENT_AMBULATORY_CARE_PROVIDER_SITE_OTHER): Payer: BC Managed Care – PPO

## 2022-05-15 DIAGNOSIS — I428 Other cardiomyopathies: Secondary | ICD-10-CM

## 2022-05-18 LAB — CUP PACEART REMOTE DEVICE CHECK
Battery Remaining Percentage: 40 %
Date Time Interrogation Session: 20231109174000
Implantable Lead Connection Status: 753985
Implantable Lead Implant Date: 20180706
Implantable Lead Location: 753862
Implantable Lead Model: 3401
Implantable Lead Serial Number: 114025
Implantable Pulse Generator Implant Date: 20180706
Pulse Gen Serial Number: 228369

## 2022-05-20 ENCOUNTER — Ambulatory Visit: Admission: EM | Admit: 2022-05-20 | Discharge: 2022-05-20 | Discharge status: Absent without leave | Payer: Self-pay

## 2022-05-20 ENCOUNTER — Ambulatory Visit: Admit: 2022-05-20 | Discharge status: Absent without leave | Payer: Self-pay

## 2022-05-20 ENCOUNTER — Ambulatory Visit
Admission: EM | Admit: 2022-05-20 | Discharge: 2022-05-20 | Disposition: A | Discharge status: Home / Self Care | Payer: BC Managed Care – PPO | Attending: Family Medicine | Admitting: Family Medicine

## 2022-05-20 DIAGNOSIS — R369 Urethral discharge, unspecified: Secondary | ICD-10-CM | POA: Diagnosis not present

## 2022-05-20 DIAGNOSIS — I110 Hypertensive heart disease with heart failure: Secondary | ICD-10-CM | POA: Insufficient documentation

## 2022-05-20 DIAGNOSIS — I480 Paroxysmal atrial fibrillation: Secondary | ICD-10-CM | POA: Insufficient documentation

## 2022-05-20 DIAGNOSIS — Z202 Contact with and (suspected) exposure to infections with a predominantly sexual mode of transmission: Secondary | ICD-10-CM

## 2022-05-20 DIAGNOSIS — Z8673 Personal history of transient ischemic attack (TIA), and cerebral infarction without residual deficits: Secondary | ICD-10-CM | POA: Diagnosis not present

## 2022-05-20 DIAGNOSIS — I5022 Chronic systolic (congestive) heart failure: Secondary | ICD-10-CM | POA: Diagnosis not present

## 2022-05-20 MED ORDER — CEFTRIAXONE SODIUM 500 MG IJ SOLR
500.0000 mg | Freq: Once | INTRAMUSCULAR | Status: AC
  Administered 2022-05-20: 500 mg via INTRAMUSCULAR

## 2022-05-20 MED ORDER — DOXYCYCLINE HYCLATE 100 MG PO CAPS: 100.0000 mg | ORAL_CAPSULE | Freq: Two times a day (BID) | ORAL | 0 refills | Status: AC

## 2022-05-20 NOTE — ED Provider Notes (Signed)
[facility]    CSN: [id number] Arrival date & time: 05/20/22  1359      History   Chief Complaint Chief Complaint  Patient presents with   Exposure to STD    HPI Nathan Baird is a 41 y.o. male.   HPI 41 year old male presents with penile discharge and burning for 2 days.  Patient endorses potential STD exposure.  PMH significant for CVA, CHF, and A-fib.  Past Medical History:  Diagnosis Date   CHF (congestive heart failure) (HCC)    Cough    a. PFTS 09/2012: restriction probable, further examinations recommended. Saw pulm 11/2012: placed on GERD rx and short course prednisone, planned to regroup.   CVA (cerebral vascular accident) (HCC) 01/23/2013   tPA administered; MRI brain showed small acute R MCA infarct   Nonischemic cardiomyopathy (HCC)    a. Identified 10/2012 by echo EF 20-25%. b. Echo 01/2013: EF 20-25%, TEE EF 15% with severe RV dysfunction as well.   Paroxysmal atrial fibrillation (HCC)    PFO (patent foramen ovale)    a. By TEE 01/2013.    Patient Active Problem List   Diagnosis Date Noted   ICD (implantable cardioverter-defibrillator) in place 10/11/2019   Chest pain 02/19/2018   NICM (nonischemic cardiomyopathy) (HCC) 01/10/2017   Trapezius muscle strain 11/24/2014   Right shoulder pain 08/24/2014   TIA (transient ischemic attack) 05/20/2014   History of CVA (cerebrovascular accident)    Essential hypertension    HLD (hyperlipidemia)    Encounter for long-term (current) use of high-risk medication 01/27/2013   Patent foramen ovale with right to left shunt 01/27/2013   Stroke, acute, embolic (HCC) 01/25/2013   Cough 10/20/2012   Chronic systolic congestive heart failure (HCC) 10/20/2012   Penile pain 05/19/2012   ATTENTION DEFICIT, W/HYPERACTIVITY 09/04/2006    Past Surgical History:  Procedure Laterality Date   NO PAST SURGERIES     SUBQ ICD IMPLANT N/A 01/10/2017   Procedure: SubQ ICD Implant;  Surgeon: Duke Salvia, MD;  [facility];  Service: Cardiovascular;  Laterality: N/A;   TEE WITHOUT CARDIOVERSION N/A 01/26/2013   Procedure: TRANSESOPHAGEAL ECHOCARDIOGRAM (TEE);  Surgeon: [name], MD;  Location: [facility] ENDOSCOPY;  Service: Cardiovascular;  Laterality: N/A;       Home Medications    Prior to Admission medications   Medication Sig Start Date End Date Taking? Authorizing Provider  doxycycline (VIBRAMYCIN) 100 MG capsule Take 1 capsule (100 mg total) by mouth 2 (two) times daily for 7 days. 05/20/22 05/27/22 Yes Trevor Iha, FNP  carvedilol (COREG) 25 MG tablet Take 1 tablet (25 mg total) by mouth 2 (two) times daily with a meal. 09/18/21   Duke Salvia, MD  sacubitril-valsartan (ENTRESTO) 49-51 MG Take 1 tablet by mouth 2 (two) times daily. 09/18/21   Duke Salvia, MD  spironolactone (ALDACTONE) 25 MG tablet Take 1 tablet (25 mg total) by mouth daily. Patient not taking: Reported on 05/20/2022 09/18/21   Duke Salvia, MD    Family History Family History  Problem Relation Age of Onset   Sarcoidosis Mother    Heart Problems Mother        PPM implantation age 27    Social History Social History   Tobacco Use   Smoking status: Never   Smokeless tobacco: Never  Vaping Use   Vaping Use: Never used  Substance Use Topics   Alcohol use: Not Currently    Comment: occ   Drug  use: No     Allergies   Patient has no known allergies.   Review of Systems Review of Systems  Genitourinary:  Positive for dysuria.     Physical Exam Triage Vital Signs ED Triage Vitals  Enc Vitals Group     BP 05/20/22 1412 113/82     Pulse Rate 05/20/22 1412 72     Resp 05/20/22 1412 12     Temp 05/20/22 1412 98.2 F (36.8 C)     Temp Source 05/20/22 1412 Oral     SpO2 05/20/22 1412 100 %     Weight --      Height --      Head Circumference --      Peak Flow --      Pain Score 05/20/22 1411 4     Pain Loc --      Pain Edu? --      Excl. in [place]? --    No data found.  Updated  Vital Signs BP 113/82 (BP Location: Right Arm)   Pulse 72   Temp 98.2 F (36.8 C) (Oral)   Resp 12   SpO2 100%      Physical Exam Vitals and nursing note reviewed.  Constitutional:      Appearance: Normal appearance. He is obese.  HENT:     Head: Normocephalic and atraumatic.     Mouth/Throat:     Mouth: Mucous membranes are moist.     Pharynx: Oropharynx is clear.  Eyes:     Extraocular Movements: Extraocular movements intact.     Conjunctiva/sclera: Conjunctivae normal.     Pupils: Pupils are equal, round, and reactive to light.  Cardiovascular:     Rate and Rhythm: Normal rate and regular rhythm.     Pulses: Normal pulses.     Heart sounds: Normal heart sounds.  Pulmonary:     Effort: Pulmonary effort is normal.     Breath sounds: Normal breath sounds. No wheezing or rhonchi.  Abdominal:     Tenderness: There is no right CVA tenderness or left CVA tenderness.  Musculoskeletal:        General: Normal range of motion.     Cervical back: Normal range of motion and neck supple.  Skin:    General: Skin is warm and dry.  Neurological:     General: No focal deficit present.     Mental Status: He is alert and oriented to person, place, and time. Mental status is at baseline.      UC Treatments / Results  Labs (all labs ordered are listed, but only abnormal results are displayed) Labs Reviewed  CYTOLOGY, (ORAL, ANAL, URETHRAL) ANCILLARY ONLY    EKG   Radiology No results found.  Procedures Procedures (including critical care time)  Medications Ordered in UC Medications  cefTRIAXone (ROCEPHIN) injection 500 mg (500 mg Intramuscular Given 05/20/22 1443)    Initial Impression / Assessment and Plan / UC Course  I have reviewed the triage vital signs and the nursing notes.  Pertinent labs & imaging results that were available during my care of the patient were reviewed by me and considered in my medical decision making (see chart for details).     MDM: 1.   Possible exposure to STD-IM Rocephin 500 mg given once in clinic, Rx'd Doxycycline, Aptima swab ordered. Advised patient to take medication as directed with food to completion.  Encouraged patient to increase daily water intake to 64 ounces per day while taking this medication.  Advised patient we will follow-up with Aptima swab results once received. Final Clinical Impressions(s) / UC Diagnoses   Final diagnoses:  Possible exposure to STD     Discharge Instructions      Advised patient to take medication as directed with food to completion.  Encouraged patient to increase daily water intake to 64 ounces per day while taking this medication.  Advised patient we will follow-up with Aptima swab results once received.     ED Prescriptions     Medication Sig Dispense Auth. Provider   doxycycline (VIBRAMYCIN) 100 MG capsule Take 1 capsule (100 mg total) by mouth 2 (two) times daily for 7 days. 14 capsule Eliezer Lofts, FNP      PDMP not reviewed this encounter.   Eliezer Lofts, [place] 05/20/22 1448

## 2022-05-20 NOTE — ED Triage Notes (Signed)
Pt presents with c/o penile discharge and burning x 2 days. Pt endorses potential STD exposure.

## 2022-05-20 NOTE — Discharge Instructions (Addendum)
Advised patient to take medication as directed with food to completion.  Encouraged patient to increase daily water intake to 64 ounces per day while taking this medication.  Advised patient we will follow-up with Aptima swab results once received.

## 2022-05-21 LAB — CYTOLOGY, (ORAL, ANAL, URETHRAL) ANCILLARY ONLY
Chlamydia: NEGATIVE
Comment: NEGATIVE
Comment: NEGATIVE
Comment: NORMAL
Neisseria Gonorrhea: NEGATIVE
Trichomonas: POSITIVE — AB

## 2022-05-22 ENCOUNTER — Telehealth (HOSPITAL_COMMUNITY): Payer: Self-pay | Admitting: Emergency Medicine

## 2022-05-22 MED ORDER — METRONIDAZOLE 500 MG PO TABS: 2000.0000 mg | ORAL_TABLET | Freq: Once | ORAL | 0 refills | Status: AC

## 2022-06-03 NOTE — Progress Notes (Signed)
Remote ICD transmission.   

## 2022-07-25 ENCOUNTER — Ambulatory Visit
Admission: EM | Admit: 2022-07-25 | Discharge: 2022-07-25 | Disposition: A | Discharge status: Home / Self Care | Payer: BC Managed Care – PPO | Attending: Physician Assistant | Admitting: Physician Assistant

## 2022-07-25 DIAGNOSIS — R509 Fever, unspecified: Secondary | ICD-10-CM

## 2022-07-25 DIAGNOSIS — R051 Acute cough: Secondary | ICD-10-CM

## 2022-07-25 DIAGNOSIS — J069 Acute upper respiratory infection, unspecified: Secondary | ICD-10-CM | POA: Diagnosis not present

## 2022-07-25 DIAGNOSIS — J111 Influenza due to unidentified influenza virus with other respiratory manifestations: Secondary | ICD-10-CM

## 2022-07-25 DIAGNOSIS — Z1152 Encounter for screening for COVID-19: Secondary | ICD-10-CM

## 2022-07-25 MED ORDER — DM-GUAIFENESIN ER 30-600 MG PO TB12: 1.0000 | ORAL_TABLET | Freq: Two times a day (BID) | ORAL | 0 refills | Status: DC

## 2022-07-25 MED ORDER — OSELTAMIVIR PHOSPHATE 75 MG PO CAPS: 75.0000 mg | ORAL_CAPSULE | Freq: Two times a day (BID) | ORAL | 0 refills | Status: DC

## 2022-07-25 NOTE — ED Provider Notes (Signed)
[name]    CSN: [id number] Arrival date & time: 07/25/22  1201      History   Chief Complaint Chief Complaint  Patient presents with   Headache   Generalized Body Aches   Chills   Cough    HPI Nathan Baird is a 42 y.o. male.   42 year old male presents with cough and congestion.  Patient indicates for the past 2 days he has been having progressive upper respiratory congestion with rhinitis and postnasal drip being clear to yellow.  He also indicates having sinus pressure mainly maxillary and frontal which is causing him an intermittent headache mainly on the right side.  He also indicates having chest congestion with recurrent cough, production has been thick clear and yellow.  He also indicates having fatigue, tiredness, fever, chills, body aches and pains and muscle soreness that has been progressive over the past 48 hours.  He indicates he has not been around any coworkers or family that has been sick however he does indicate that his wife works in the hospital and she is exposed to a lot of different patients.  Has been taking some OTC Mucinex without relief of his symptoms.  He denies any wheezing, shortness of breath, nausea or vomiting.   Headache Associated symptoms: cough and drainage   Cough Associated symptoms: headaches and rhinorrhea     Past Medical History:  Diagnosis Date   CHF (congestive heart failure) (HCC)    Cough    a. PFTS 09/2012: restriction probable, further examinations recommended. Saw pulm 11/2012: placed on GERD rx and short course prednisone, planned to regroup.   CVA (cerebral vascular accident) (HCC) 01/23/2013   tPA administered; MRI brain showed small acute R MCA infarct   Nonischemic cardiomyopathy (HCC)    a. Identified 10/2012 by echo EF 20-25%. b. Echo 01/2013: EF 20-25%, TEE EF 15% with severe RV dysfunction as well.   Paroxysmal atrial fibrillation (HCC)    PFO (patent foramen ovale)    a. By TEE 01/2013.    Patient  Active Problem List   Diagnosis Date Noted   ICD (implantable cardioverter-defibrillator) in place 10/11/2019   Chest pain 02/19/2018   NICM (nonischemic cardiomyopathy) (HCC) 01/10/2017   Trapezius muscle strain 11/24/2014   Right shoulder pain 08/24/2014   TIA (transient ischemic attack) 05/20/2014   History of CVA (cerebrovascular accident)    Essential hypertension    HLD (hyperlipidemia)    Encounter for long-term (current) use of high-risk medication 01/27/2013   Patent foramen ovale with right to left shunt 01/27/2013   Stroke, acute, embolic (HCC) 01/25/2013   Cough 10/20/2012   Chronic systolic congestive heart failure (HCC) 10/20/2012   Penile pain 05/19/2012   ATTENTION DEFICIT, W/HYPERACTIVITY 09/04/2006    Past Surgical History:  Procedure Laterality Date   NO PAST SURGERIES     SUBQ ICD IMPLANT N/A 01/10/2017   Procedure: SubQ ICD Implant;  Surgeon: Duke Salvia, MD;  Location: [facility] INVASIVE CV LAB;  Service: Cardiovascular;  Laterality: N/A;   TEE WITHOUT CARDIOVERSION N/A 01/26/2013   Procedure: TRANSESOPHAGEAL ECHOCARDIOGRAM (TEE);  Surgeon: [name], MD;  Location: [facility] ENDOSCOPY;  Service: Cardiovascular;  Laterality: N/A;       Home Medications    Prior to Admission medications   Medication Sig Start Date End Date Taking? Authorizing Provider  dextromethorphan-guaiFENesin (MUCINEX DM) 30-600 MG 12hr tablet Take 1 tablet by mouth 2 (two) times daily. 07/25/22  Yes Ellsworth Lennox, PA-C  oseltamivir (TAMIFLU) 75  MG capsule Take 1 capsule (75 mg total) by mouth every 12 (twelve) hours. 07/25/22  Yes Nyoka Lint, PA-C  carvedilol (COREG) 25 MG tablet Take 1 tablet (25 mg total) by mouth 2 (two) times daily with a meal. 09/18/21   Deboraha Sprang, MD  sacubitril-valsartan (ENTRESTO) 49-51 MG Take 1 tablet by mouth 2 (two) times daily. 09/18/21   Deboraha Sprang, MD  spironolactone (ALDACTONE) 25 MG tablet Take 1 tablet (25 mg total) by mouth daily. Patient not  taking: Reported on 05/20/2022 09/18/21   Deboraha Sprang, MD    Family History Family History  Problem Relation Age of Onset   Sarcoidosis Mother    Heart Problems Mother        PPM implantation age 53    Social History Social History   Tobacco Use   Smoking status: Never   Smokeless tobacco: Never  Vaping Use   Vaping Use: Never used  Substance Use Topics   Alcohol use: Not Currently    Comment: occ   Drug use: No     Allergies   Patient has no known allergies.   Review of Systems Review of Systems  HENT:  Positive for postnasal drip and rhinorrhea.   Respiratory:  Positive for cough.   Neurological:  Positive for headaches.     Physical Exam Triage Vital Signs ED Triage Vitals [07/25/22 1234]  Enc Vitals Group     BP 127/76     Pulse Rate 81     Resp 16     Temp 99.6 F (37.6 C)     Temp Source Oral     SpO2 96 %     Weight      Height      Head Circumference      Peak Flow      Pain Score 0     Pain Loc      Pain Edu?      Excl. in [place]?    No data found.  Updated Vital Signs BP 127/76 (BP Location: Left Arm)   Pulse 81   Temp 99.6 F (37.6 C) (Oral)   Resp 16   SpO2 96%   Visual Acuity Right Eye Distance:   Left Eye Distance:   Bilateral Distance:    Right Eye Near:   Left Eye Near:    Bilateral Near:     Physical Exam Constitutional:      Appearance: He is well-developed.  HENT:     Right Ear: Tympanic membrane and ear canal normal.     Left Ear: Tympanic membrane and ear canal normal.     Mouth/Throat:     Mouth: Mucous membranes are moist.     Pharynx: Oropharynx is clear.  Cardiovascular:     Rate and Rhythm: Normal rate and regular rhythm.     Heart sounds: Normal heart sounds.  Pulmonary:     Effort: Pulmonary effort is normal.     Breath sounds: Normal breath sounds and air entry. No wheezing, rhonchi or rales.  Lymphadenopathy:     Cervical: No cervical adenopathy.  Neurological:     Mental Status: He is alert.       UC Treatments / Results  Labs (all labs ordered are listed, but only abnormal results are displayed) Labs Reviewed  SARS CORONAVIRUS 2 (TAT 6-24 HRS)    EKG   Radiology No results found.  Procedures Procedures (including critical care time)  Medications Ordered in UC Medications - No  data to display  Initial Impression / Assessment and Plan / UC Course  I have reviewed the triage vital signs and the nursing notes.  Pertinent labs & imaging results that were available during my care of the patient were reviewed by me and considered in my medical decision making (see chart for details).    Plan: 1.  The acute upper respiratory infection be treated with the following: A.  Mucinex DM every 12 hours to treat cough and congestion. 2.  The acute cough will be treated with the following: A.  Mucinex DM every 12 hours to treat cough and congestion. 3.  The flu will be treated with the following: A.  Tamiflu 75 mg twice daily for 5 days only to treat the flu. 4.  The fever be treated with the following: A.  Advised to Tylenol ibuprofen to help control the fever and body discomfort. 5.  Screening for COVID-19 will be treated with the following: A.  Treatment may be considered to modified depending on the results of the COVID test. 6.  Advised follow-up PCP or return to urgent care if symptoms fail to improve. Final Clinical Impressions(s) / UC Diagnoses   Final diagnoses:  Acute upper respiratory infection  Encounter for screening for COVID-19  Acute cough  Fever, unspecified  Flu     Discharge Instructions      COVID test will be completed in 48 hours.  If you do not get a call from this office in 48 hours that indicates the test is negative.  Log onto MyChart to be the test results with post in 48 hours.  Advised to take Mucinex DM every 12 hours to help control cough and chest congestion. Advised start Tamiflu 75 mg twice daily for 5 days only as this will help  treat the flu.  Advised to use Tylenol or ibuprofen for body aches, fever and discomfort. Advised to follow-up PCP or return to urgent care if symptoms fail to improve.    ED Prescriptions     Medication Sig Dispense Auth. Provider   oseltamivir (TAMIFLU) 75 MG capsule Take 1 capsule (75 mg total) by mouth every 12 (twelve) hours. 10 capsule Nyoka Lint, PA-C   dextromethorphan-guaiFENesin [facility] DM) 30-600 MG 12hr tablet Take 1 tablet by mouth 2 (two) times daily. 20 tablet Nyoka Lint, PA-C      PDMP not reviewed this encounter.   Nyoka Lint, PA-C 07/25/22 1255

## 2022-07-25 NOTE — Discharge Instructions (Addendum)
COVID test will be completed in 48 hours.  If you do not get a call from this office in 48 hours that indicates the test is negative.  Log onto MyChart to be the test results with post in 48 hours.  Advised to take Mucinex DM every 12 hours to help control cough and chest congestion. Advised start Tamiflu 75 mg twice daily for 5 days only as this will help treat the flu.  Advised to use Tylenol or ibuprofen for body aches, fever and discomfort. Advised to follow-up PCP or return to urgent care if symptoms fail to improve.

## 2022-07-25 NOTE — ED Triage Notes (Signed)
Patient presents to UC for HA, productive cough, chills, chest tenderness from cough, and body aches since Treating symptoms with mucinex.   Denies fever.

## 2022-07-26 LAB — SARS CORONAVIRUS 2 (TAT 6-24 HRS): SARS Coronavirus 2: POSITIVE — AB

## 2022-08-14 ENCOUNTER — Ambulatory Visit (INDEPENDENT_AMBULATORY_CARE_PROVIDER_SITE_OTHER): Payer: BLUE CROSS/BLUE SHIELD

## 2022-08-14 DIAGNOSIS — I428 Other cardiomyopathies: Secondary | ICD-10-CM

## 2022-08-16 LAB — CUP PACEART REMOTE DEVICE CHECK
Battery Remaining Percentage: 37 %
Date Time Interrogation Session: 20240208160700
Implantable Lead Connection Status: 753985
Implantable Lead Implant Date: 20180706
Implantable Lead Location: 753862
Implantable Lead Model: 3401
Implantable Lead Serial Number: 114025
Implantable Pulse Generator Implant Date: 20180706
Pulse Gen Serial Number: 228369

## 2022-09-07 ENCOUNTER — Other Ambulatory Visit: Payer: Self-pay | Admitting: Internal Medicine

## 2022-09-07 DIAGNOSIS — I5022 Chronic systolic (congestive) heart failure: Secondary | ICD-10-CM

## 2022-09-07 DIAGNOSIS — I4891 Unspecified atrial fibrillation: Secondary | ICD-10-CM

## 2022-09-07 DIAGNOSIS — Z95810 Presence of automatic (implantable) cardiac defibrillator: Secondary | ICD-10-CM

## 2022-09-07 DIAGNOSIS — Z8673 Personal history of transient ischemic attack (TIA), and cerebral infarction without residual deficits: Secondary | ICD-10-CM

## 2022-09-07 DIAGNOSIS — R0683 Snoring: Secondary | ICD-10-CM

## 2022-09-07 DIAGNOSIS — I428 Other cardiomyopathies: Secondary | ICD-10-CM

## 2022-09-10 NOTE — Progress Notes (Signed)
Remote ICD transmission.   

## 2022-09-16 ENCOUNTER — Other Ambulatory Visit: Payer: Self-pay | Admitting: Internal Medicine

## 2022-09-16 DIAGNOSIS — R0683 Snoring: Secondary | ICD-10-CM

## 2022-09-16 DIAGNOSIS — I4891 Unspecified atrial fibrillation: Secondary | ICD-10-CM

## 2022-09-16 DIAGNOSIS — Z8673 Personal history of transient ischemic attack (TIA), and cerebral infarction without residual deficits: Secondary | ICD-10-CM

## 2022-09-16 DIAGNOSIS — Z95810 Presence of automatic (implantable) cardiac defibrillator: Secondary | ICD-10-CM

## 2022-09-16 DIAGNOSIS — I5022 Chronic systolic (congestive) heart failure: Secondary | ICD-10-CM

## 2022-09-16 DIAGNOSIS — I428 Other cardiomyopathies: Secondary | ICD-10-CM

## 2022-10-30 ENCOUNTER — Ambulatory Visit: Payer: BC Managed Care – PPO | Attending: Internal Medicine | Admitting: Internal Medicine

## 2022-10-30 ENCOUNTER — Encounter: Payer: Self-pay | Admitting: Internal Medicine

## 2022-10-30 VITALS — BP 116/78 | HR 66 | Ht 75.0 in | Wt 292.6 lb

## 2022-10-30 DIAGNOSIS — Z95810 Presence of automatic (implantable) cardiac defibrillator: Secondary | ICD-10-CM | POA: Diagnosis not present

## 2022-10-30 DIAGNOSIS — I480 Paroxysmal atrial fibrillation: Secondary | ICD-10-CM

## 2022-10-30 DIAGNOSIS — Z79899 Other long term (current) drug therapy: Secondary | ICD-10-CM

## 2022-10-30 DIAGNOSIS — I428 Other cardiomyopathies: Secondary | ICD-10-CM

## 2022-10-30 DIAGNOSIS — G459 Transient cerebral ischemic attack, unspecified: Secondary | ICD-10-CM

## 2022-10-30 DIAGNOSIS — Z8673 Personal history of transient ischemic attack (TIA), and cerebral infarction without residual deficits: Secondary | ICD-10-CM

## 2022-10-30 DIAGNOSIS — R0683 Snoring: Secondary | ICD-10-CM

## 2022-10-30 DIAGNOSIS — I4891 Unspecified atrial fibrillation: Secondary | ICD-10-CM

## 2022-10-30 DIAGNOSIS — I5022 Chronic systolic (congestive) heart failure: Secondary | ICD-10-CM

## 2022-10-30 LAB — CUP PACEART INCLINIC DEVICE CHECK
Date Time Interrogation Session: 20240424140809
Implantable Lead Connection Status: 753985
Implantable Lead Implant Date: 20180706
Implantable Lead Location: 753862
Implantable Lead Model: 3401
Implantable Lead Serial Number: 114025
Implantable Pulse Generator Implant Date: 20180706
Pulse Gen Serial Number: 228369

## 2022-10-30 MED ORDER — CARVEDILOL 25 MG PO TABS
25.0000 mg | ORAL_TABLET | Freq: Two times a day (BID) | ORAL | 3 refills | Status: DC
Start: 2022-10-30 — End: 2023-11-27

## 2022-10-30 MED ORDER — ENTRESTO 49-51 MG PO TABS
1.0000 | ORAL_TABLET | Freq: Two times a day (BID) | ORAL | 3 refills | Status: DC
Start: 2022-10-30 — End: 2023-11-26

## 2022-10-30 MED ORDER — SPIRONOLACTONE 25 MG PO TABS: 25.0000 mg | ORAL_TABLET | Freq: Every day | ORAL | 3 refills | Status: AC

## 2022-10-30 NOTE — Progress Notes (Signed)
Patient Care Team: Patient, No Pcp Per as PCP - General (General Practice) Duke Salvia, MD as PCP - Electrophysiology (Cardiology) Nahser, Deloris Ping, MD as Attending Physician (Cardiology)   HPI  Nathan Baird is a 42 y.o. male Seen in follow-up for ICD implanted for primary prevention in the setting of nonischemic cardiomyopathy since [name] dysfunction and modest class II heart failure.  He has a history of a prior TIA currently is treated with aspirin--interval device interrogation has demonstrated atrial fibrillation up to 48 hours duration.  The patient has declined anticoagulation  The patient denies chest pain, shortness of breath, nocturnal dyspnea, orthopnea or peripheral edema.  There have been no palpitations, lightheadedness or syncope.     Date Cr K  Hgb   Mg  6/18  1.15 4.0      8/19 1.21 3.7    3/21 1.03 4.2 17.4 2.2         DATE TEST EF   8/14 cMRI  26  %   4/16 Echo   25-30 %   5/18 Echo  20-25%   5/21 Echo  35-40%      Thromboembolic risk factors ( , HTN-1, TIA/CVA-2, CHF-1) for a CHADSVASc Score of 4      Past Medical History:  Diagnosis Date   CHF (congestive heart failure)    Cough    a. PFTS 09/2012: restriction probable, further examinations recommended. Saw pulm 11/2012: placed on GERD rx and short course prednisone, planned to regroup.   CVA (cerebral vascular accident) 01/23/2013   tPA administered; MRI brain showed small acute R MCA infarct   Nonischemic cardiomyopathy    a. Identified 10/2012 by echo EF 20-25%. b. Echo 01/2013: EF 20-25%, TEE EF 15% with severe RV dysfunction as well.   Paroxysmal atrial fibrillation    PFO (patent foramen ovale)    a. By TEE 01/2013.    Past Surgical History:  Procedure Laterality Date   NO PAST SURGERIES     SUBQ ICD IMPLANT N/A 01/10/2017   Procedure: SubQ ICD Implant;  Surgeon: Duke Salvia, MD;  Location: [facility] INVASIVE CV LAB;  Service: Cardiovascular;  Laterality: N/A;   TEE WITHOUT  CARDIOVERSION N/A 01/26/2013   Procedure: TRANSESOPHAGEAL ECHOCARDIOGRAM (TEE);  Surgeon: [name], MD;  Location: [facility] ENDOSCOPY;  Service: Cardiovascular;  Laterality: N/A;    Current Outpatient Medications  Medication Sig Dispense Refill   spironolactone (ALDACTONE) 25 MG tablet Take 1 tablet (25 mg total) by mouth daily. 90 tablet 3   carvedilol (COREG) 25 MG tablet Take 1 tablet (25 mg total) by mouth 2 (two) times daily with a meal. 180 tablet 3   sacubitril-valsartan (ENTRESTO) 49-51 MG Take 1 tablet by mouth 2 (two) times daily. 180 tablet 3   No current facility-administered medications for this visit.    No Known Allergies    Review of Systems negative except from HPI and PMH  Physical Exam BP 116/78   Pulse 66   Ht  (1.905 m)   Wt 292 lb 9.6 oz (132.7 kg)   SpO2 99%   BMI 36.57 kg/m   Well developed and well nourished in no acute distress HENT normal Neck supple with JVP-flat Clear Device pocket well healed; without hematoma or erythema.  There is no tethering  Regular rate and rhythm, no murmur Abd-soft with active BS No Clubbing cyanosis  edema Skin-warm and dry A & Oriented  Grossly normal sensory and motor  function  ECG sinus  16/10/41  Device function is normal. Programming changes none  See Paceart for details     Assessment and  Plan  NICM  SubQ ICD .     AFib  TIA  Class I recall-generator and lead  His S ICD leads remain on grade 1 recall.  No obvious atrial fibrillation   No volume overload  Will reassess LVEF to help inform treatment  Continue entresto and carvedilol  Will resume spiro  Needs BMET in 2 weeks  Again declines anticoagulation

## 2022-10-30 NOTE — Patient Instructions (Addendum)
Medication Instructions:  Your physician has recommended you make the following change in your medication:   ** Restart Spirolactone  - 1 tablet by mouth daily.   *If you need a refill on your cardiac medications before your next appointment, please call your pharmacy*   Lab Work: BMET in 2 weeks  If you have labs (blood work) drawn today and your tests are completely normal, you will receive your results only by: MyChart Message (if you have MyChart) OR A paper copy in the mail If you have any lab test that is abnormal or we need to change your treatment, we will call you to review the results.   Testing/Procedures: Your physician has requested that you have an echocardiogram. Echocardiography is a painless test that uses sound waves to create images of your heart. It provides your doctor with information about the size and shape of your heart and how well your heart's chambers and valves are working. This procedure takes approximately one hour. There are no restrictions for this procedure. Please do NOT wear cologne, perfume, aftershave, or lotions (deodorant is allowed). Please arrive 15 minutes prior to your appointment time.     Follow-Up: At [facility], you and your health needs are our priority.  As part of our continuing mission to provide you with exceptional heart care, we have created designated Provider Care Teams.  These Care Teams include your primary Cardiologist (physician) and Advanced Practice Providers (APPs -  Physician Assistants and Nurse Practitioners) who all work together to provide you with the care you need, when you need it.  We recommend signing up for the patient portal called "MyChart".  Sign up information is provided on this After Visit Summary.  MyChart is used to connect with patients for Virtual Visits (Telemedicine).  Patients are able to view lab/test results, encounter notes, upcoming appointments, etc.  Non-urgent messages can be  sent to your provider as well.   To learn more about what you can do with MyChart, go to [email or web address].    Your next appointment:   6 months with Dr Odessa Fleming PA

## 2022-11-13 ENCOUNTER — Ambulatory Visit (INDEPENDENT_AMBULATORY_CARE_PROVIDER_SITE_OTHER): Payer: BC Managed Care – PPO

## 2022-11-13 ENCOUNTER — Ambulatory Visit: Payer: BC Managed Care – PPO

## 2022-11-13 DIAGNOSIS — I428 Other cardiomyopathies: Secondary | ICD-10-CM

## 2022-11-14 LAB — CUP PACEART REMOTE DEVICE CHECK
Battery Remaining Percentage: 35 %
Date Time Interrogation Session: 20240507172200
Implantable Lead Connection Status: 753985
Implantable Lead Implant Date: 20180706
Implantable Lead Location: 753862
Implantable Lead Model: 3401
Implantable Lead Serial Number: 114025
Implantable Pulse Generator Implant Date: 20180706
Pulse Gen Serial Number: 228369

## 2022-12-04 ENCOUNTER — Ambulatory Visit (HOSPITAL_COMMUNITY): Payer: BC Managed Care – PPO | Attending: Internal Medicine

## 2022-12-04 DIAGNOSIS — I428 Other cardiomyopathies: Secondary | ICD-10-CM | POA: Insufficient documentation

## 2022-12-04 LAB — ECHOCARDIOGRAM COMPLETE
Area-P 1/2: 3.95 cm2
S' Lateral: 4.5 cm

## 2022-12-04 NOTE — Progress Notes (Signed)
Remote ICD transmission.   

## 2023-01-16 ENCOUNTER — Ambulatory Visit
Admission: EM | Admit: 2023-01-16 | Discharge: 2023-01-16 | Disposition: A | Discharge status: Home / Self Care | Payer: BC Managed Care – PPO | Attending: Physician Assistant | Admitting: Physician Assistant

## 2023-01-16 ENCOUNTER — Ambulatory Visit (INDEPENDENT_AMBULATORY_CARE_PROVIDER_SITE_OTHER): Payer: BC Managed Care – PPO

## 2023-01-16 DIAGNOSIS — M25551 Pain in right hip: Secondary | ICD-10-CM

## 2023-01-16 MED ORDER — PREDNISONE 20 MG PO TABS: ORAL_TABLET | ORAL | 0 refills | Status: DC

## 2023-01-16 MED ORDER — TIZANIDINE HCL 4 MG PO TABS: 4.0000 mg | ORAL_TABLET | Freq: Three times a day (TID) | ORAL | 0 refills | Status: AC | PRN

## 2023-01-16 NOTE — ED Provider Notes (Signed)
[facility]    CSN: [id number] Arrival date & time: 01/16/23  1138      History   Chief Complaint Chief Complaint  Patient presents with   Hip Pain         HPI Nathan Baird is a 42 y.o. male with history of CHF, CVA, nonischemic cardiomyopathy, atrial fibrillation, presence of ICD, hyperlipidemia and hypertension.  Patient presents today for right-sided hip and leg pain for the past 2 weeks.  Denies injury and has not had any pain in his back.  Patient reports most of the pain is of the lateral right hip.  It does sometimes radiate into the upper part of his right calf.  No numbness, weakness or tingling.  Pain is worse when he puts pressure on the right side.  Denies any significant pain with movement of the hip or any pain when raising the leg or bending forward or extending back.  Has taken OTC meds without improvement in his discomfort.  Not reporting any loss of bowel or bladder control or leg weakness.  HPI  Past Medical History:  Diagnosis Date   CHF (congestive heart failure) (HCC)    Cough    a. PFTS 09/2012: restriction probable, further examinations recommended. Saw pulm 11/2012: placed on GERD rx and short course prednisone, planned to regroup.   CVA (cerebral vascular accident) (HCC) 01/23/2013   tPA administered; MRI brain showed small acute R MCA infarct   Nonischemic cardiomyopathy (HCC)    a. Identified 10/2012 by echo EF 20-25%. b. Echo 01/2013: EF 20-25%, TEE EF 15% with severe RV dysfunction as well.   Paroxysmal atrial fibrillation (HCC)    PFO (patent foramen ovale)    a. By TEE 01/2013.    Patient Active Problem List   Diagnosis Date Noted   ICD (implantable cardioverter-defibrillator) in place 10/11/2019   Chest pain 02/19/2018   NICM (nonischemic cardiomyopathy) (HCC) 01/10/2017   Trapezius muscle strain 11/24/2014   Right shoulder pain 08/24/2014   TIA (transient ischemic attack) 05/20/2014   History of CVA (cerebrovascular accident)     Essential hypertension    HLD (hyperlipidemia)    Encounter for long-term (current) use of high-risk medication 01/27/2013   Patent foramen ovale with right to left shunt 01/27/2013   Stroke, acute, embolic (HCC) 01/25/2013   Cough 10/20/2012   Chronic systolic congestive heart failure (HCC) 10/20/2012   Penile pain 05/19/2012   ATTENTION DEFICIT, W/HYPERACTIVITY 09/04/2006    Past Surgical History:  Procedure Laterality Date   NO PAST SURGERIES     SUBQ ICD IMPLANT N/A 01/10/2017   Procedure: SubQ ICD Implant;  Surgeon: Duke Salvia, MD;  Location: [facility] INVASIVE CV LAB;  Service: Cardiovascular;  Laterality: N/A;   TEE WITHOUT CARDIOVERSION N/A 01/26/2013   Procedure: TRANSESOPHAGEAL ECHOCARDIOGRAM (TEE);  Surgeon: [name], MD;  Location: [facility] ENDOSCOPY;  Service: Cardiovascular;  Laterality: N/A;       Home Medications    Prior to Admission medications   Medication Sig Start Date End Date Taking? Authorizing Provider  carvedilol (COREG) 25 MG tablet Take 1 tablet (25 mg total) by mouth 2 (two) times daily with a meal. 10/30/22  Yes Duke Salvia, MD  predniSONE (DELTASONE) 20 MG tablet Take 6 tabs po on day 1 and decrease by 1 tab daily until complete 01/16/23  Yes Eusebio Friendly B, PA-C  sacubitril-valsartan (ENTRESTO) 49-51 MG Take 1 tablet by mouth 2 (two) times daily. 10/30/22  Yes Duke Salvia,  MD  spironolactone (ALDACTONE) 25 MG tablet Take 1 tablet (25 mg total) by mouth daily. 10/30/22  Yes Duke Salvia, MD  tiZANidine (ZANAFLEX) 4 MG tablet Take 1 tablet (4 mg total) by mouth every 8 (eight) hours as needed for up to 7 days for muscle spasms. 01/16/23 01/23/23 Yes Shirlee Latch, PA-C    Family History Family History  Problem Relation Age of Onset   Sarcoidosis Mother    Heart Problems Mother        PPM implantation age 52    Social History Social History   Tobacco Use   Smoking status: Never   Smokeless tobacco: Never  Vaping Use   Vaping  status: Never Used  Substance Use Topics   Alcohol use: Not Currently    Comment: occ   Drug use: No     Allergies   Patient has no known allergies.   Review of Systems Review of Systems  Musculoskeletal:  Positive for arthralgias. Negative for back pain and joint swelling.  Neurological:  Negative for weakness and numbness.     Physical Exam Triage Vital Signs ED Triage Vitals  Encounter Vitals Group     BP      Systolic BP Percentile      Diastolic BP Percentile      Pulse      Resp      Temp      Temp src      SpO2      Weight      Height      Head Circumference      Peak Flow      Pain Score      Pain Loc      Pain Education      Exclude from Growth Chart    No data found.  Updated Vital Signs BP 128/88 (BP Location: Left Arm)   Pulse 73   Temp 98.2 F (36.8 C) (Oral)   Ht 6\' 1"  (1.854 m)   Wt 280 lb (127 kg)   SpO2 96%   BMI 36.94 kg/m      Physical Exam Vitals and nursing note reviewed.  Constitutional:      General: He is not in acute distress.    Appearance: Normal appearance. He is well-developed. He is not ill-appearing.  HENT:     Head: Normocephalic and atraumatic.  Eyes:     General: No scleral icterus.    Conjunctiva/sclera: Conjunctivae normal.  Cardiovascular:     Rate and Rhythm: Normal rate and regular rhythm.     Heart sounds: Normal heart sounds.  Pulmonary:     Effort: Pulmonary effort is normal. No respiratory distress.     Breath sounds: Normal breath sounds.  Musculoskeletal:     Cervical back: Neck supple.     Lumbar back: No tenderness. Normal range of motion. Negative right straight leg raise test and negative left straight leg raise test.     Right hip: Tenderness (right lateral hip) present. No deformity. Decreased range of motion.  Skin:    General: Skin is warm and dry.     Capillary Refill: Capillary refill takes less than 2 seconds.  Neurological:     General: No focal deficit present.     Mental Status:  He is alert. Mental status is at baseline.     Motor: No weakness.     Gait: Gait normal.  Psychiatric:        Mood and Affect: Mood normal.  Behavior: Behavior normal.      UC Treatments / Results  Labs (all labs ordered are listed, but only abnormal results are displayed) Labs Reviewed - No data to display  EKG   Radiology DG Hip Unilat W or Wo Pelvis 2-3 Views Right  Result Date: 01/16/2023 CLINICAL DATA:  Lateral right hip pain with no history of injury EXAM: DG HIP (WITH OR WITHOUT PELVIS) 2-3V RIGHT COMPARISON:  None available FINDINGS: No fracture or dislocation. Soft tissues are unremarkable. IMPRESSION: No acute abnormality of the right hip. Electronically Signed   By: Acquanetta Belling M.D.   On: 01/16/2023 12:48    Procedures Procedures (including critical care time)  Medications Ordered in UC Medications - No data to display  Initial Impression / Assessment and Plan / UC Course  I have reviewed the triage vital signs and the nursing notes.  Pertinent labs & imaging results that were available during my care of the patient were reviewed by me and considered in my medical decision making (see chart for details).   42 year old male with history of CHF, CVA, nonischemic cardiomyopathy, atrial fibrillation, presence of ICD, hyperlipidemia and hypertension presents for 2-week history of right hip and leg pain.  X-ray of right hip obtained.  X-ray negative.  Discussed result with patient.  Suspect bursitis or pinched nerve.  Supportive care advised.  Tylenol for pain.  Sent prednisone and tizanidine.  Encouraged stretching, rest, ice, muscle rubs.  Reviewed following up with Ortho if symptoms or not improving after the prednisone taper.   Final Clinical Impressions(s) / UC Diagnoses   Final diagnoses:  Right hip pain     Discharge Instructions      -X-ray of hip looks normal. - Symptoms could be related to a bursitis or pinched nerve.  I sent an  anti-inflammatory medication to the pharmacy and muscle relaxer.  Can also take Tylenol.  Use heat, ice and stretch.  If no improvement after you finish the corticosteroids then please follow-up with orthopedics or PCP.  You have a condition requiring you to follow up with Orthopedics so please call one of the following office for appointment:   [facility] [address] [place], [place] [address] Phone: [phone]  [facility] [address], [place], [place] [address] Phone: [phone]      ED Prescriptions     Medication Sig Dispense Auth. Provider   predniSONE (DELTASONE) 20 MG tablet Take 6 tabs po on day 1 and decrease by 1 tab daily until complete 21 tablet Eusebio Friendly B, PA-C   tiZANidine (ZANAFLEX) 4 MG tablet Take 1 tablet (4 mg total) by mouth every 8 (eight) hours as needed for up to 7 days for muscle spasms. 20 tablet Gareth Morgan      PDMP not reviewed this encounter.   Shirlee Latch, PA-C 01/16/23 1322

## 2023-01-16 NOTE — Discharge Instructions (Addendum)
-  X-ray of hip looks normal. - Symptoms could be related to a bursitis or pinched nerve.  I sent an anti-inflammatory medication to the pharmacy and muscle relaxer.  Can also take Tylenol.  Use heat, ice and stretch.  If no improvement after you finish the corticosteroids then please follow-up with orthopedics or PCP.  You have a condition requiring you to follow up with Orthopedics so please call one of the following office for appointment:   [facility] [address] [place], [place] [address] Phone: [phone]  [facility] [address], [place], [place] [address] Phone: [phone]

## 2023-01-16 NOTE — ED Triage Notes (Signed)
Pt c/o right hip and leg pain x3weeks  Pt states that the pain is shooting down his leg and he can not lay on his right side.  Pt states that the pain turns sharp and shoots from his hip down his leg.   Pt states that he has been using OTC pain patches and they help slightly.   Pt states that the pain is worse laying down.  Pt denies any heavy lifting or new sports/physical activities.

## 2023-02-12 ENCOUNTER — Ambulatory Visit: Payer: BLUE CROSS/BLUE SHIELD

## 2023-02-12 DIAGNOSIS — I428 Other cardiomyopathies: Secondary | ICD-10-CM

## 2023-02-28 NOTE — Progress Notes (Signed)
Remote ICD transmission.   

## 2023-03-09 ENCOUNTER — Emergency Department (HOSPITAL_BASED_OUTPATIENT_CLINIC_OR_DEPARTMENT_OTHER)
Admission: EM | Admit: 2023-03-09 | Discharge: 2023-03-09 | Disposition: A | Discharge status: Home / Self Care | Payer: BC Managed Care – PPO | Source: Home / Self Care | Attending: Emergency Medicine | Admitting: Emergency Medicine

## 2023-03-09 ENCOUNTER — Other Ambulatory Visit: Payer: Self-pay

## 2023-03-09 ENCOUNTER — Emergency Department (HOSPITAL_BASED_OUTPATIENT_CLINIC_OR_DEPARTMENT_OTHER): Payer: BC Managed Care – PPO

## 2023-03-09 DIAGNOSIS — M7989 Other specified soft tissue disorders: Secondary | ICD-10-CM | POA: Diagnosis not present

## 2023-03-09 DIAGNOSIS — M79602 Pain in left arm: Secondary | ICD-10-CM | POA: Diagnosis not present

## 2023-03-09 DIAGNOSIS — L03114 Cellulitis of left upper limb: Secondary | ICD-10-CM | POA: Diagnosis not present

## 2023-03-09 MED ORDER — NAPROXEN 500 MG PO TABS: 500.0000 mg | ORAL_TABLET | Freq: Two times a day (BID) | ORAL | 0 refills | Status: DC

## 2023-03-09 MED ORDER — OXYCODONE-ACETAMINOPHEN 5-325 MG PO TABS
1.0000 | ORAL_TABLET | Freq: Three times a day (TID) | ORAL | 0 refills | Status: DC | PRN
Start: 2023-03-09 — End: 2023-04-11

## 2023-03-09 MED ORDER — CEPHALEXIN 500 MG PO CAPS: 500.0000 mg | ORAL_CAPSULE | Freq: Four times a day (QID) | ORAL | 0 refills | Status: DC

## 2023-03-09 MED ORDER — OXYCODONE-ACETAMINOPHEN 5-325 MG PO TABS
2.0000 | ORAL_TABLET | Freq: Once | ORAL | Status: AC
  Administered 2023-03-09: 2 via ORAL
  Filled 2023-03-09: qty 2

## 2023-03-09 MED ORDER — CEPHALEXIN 250 MG PO CAPS
1000.0000 mg | ORAL_CAPSULE | Freq: Once | ORAL | Status: AC
  Administered 2023-03-09: 1000 mg via ORAL
  Filled 2023-03-09: qty 4

## 2023-03-09 NOTE — ED Provider Notes (Signed)
[place] [facility] Provider Note   CSN: [id number] Arrival date & time: 03/09/23  0035     History  Chief Complaint  Patient presents with   Joint Swelling    Nathan Baird is a 42 y.o. male.  42 year old male with history of high blood pressure but no other significant medical problems presents ER today secondary to left arm pain and swelling.  Patient states has been going on for a few days and progressively worsened.  Started around a small wound on his elbow and has progressed from there.  Tender to touch also hurts when he bends his arm.  No trauma that he remembers.  No fevers.  No nausea or vomiting or other systemic symptoms.        Home Medications Prior to Admission medications   Medication Sig Start Date End Date Taking? Authorizing Provider  cephALEXin (KEFLEX) 500 MG capsule Take 1 capsule (500 mg total) by mouth 4 (four) times daily. 03/09/23  Yes Caleb Decock, Barbara Cower, MD  naproxen (NAPROSYN) 500 MG tablet Take 1 tablet (500 mg total) by mouth 2 (two) times daily. 03/09/23  Yes Olympia Adelsberger, Barbara Cower, MD  oxyCODONE-acetaminophen (PERCOCET) 5-325 MG tablet Take 1-2 tablets by mouth every 8 (eight) hours as needed. 03/09/23  Yes Vida Nicol, Barbara Cower, MD  carvedilol (COREG) 25 MG tablet Take 1 tablet (25 mg total) by mouth 2 (two) times daily with a meal. 10/30/22   Duke Salvia, MD  predniSONE (DELTASONE) 20 MG tablet Take 6 tabs po on day 1 and decrease by 1 tab daily until complete 01/16/23   Shirlee Latch, PA-C  sacubitril-valsartan (ENTRESTO) 49-51 MG Take 1 tablet by mouth 2 (two) times daily. 10/30/22   Duke Salvia, MD  spironolactone (ALDACTONE) 25 MG tablet Take 1 tablet (25 mg total) by mouth daily. 10/30/22   Duke Salvia, MD      Allergies    Patient has no known allergies.    Review of Systems   Review of Systems  Physical Exam Updated Vital Signs BP 129/88   Pulse 74   Temp 98.1 F (36.7 C) (Oral)   Resp 18   Ht 6\' 2"  (1.88  m)   Wt 133.4 kg   SpO2 96%   BMI 37.75 kg/m  Physical Exam Vitals and nursing note reviewed.  Constitutional:      Appearance: He is well-developed.  HENT:     Head: Normocephalic and atraumatic.  Eyes:     Pupils: Pupils are equal, round, and reactive to light.  Cardiovascular:     Rate and Rhythm: Normal rate.  Pulmonary:     Effort: Pulmonary effort is normal. No respiratory distress.  Abdominal:     General: There is no distension.  Musculoskeletal:        General: Normal range of motion.     Cervical back: Normal range of motion.     Comments: Mild skin pain with range of motion but no joint pain  Skin:    Comments: Patient with erythema, edema, induration and tenderness in the area around his elbow approximately 10 x 6 cm.  No obvious fluctuance.  Bedside ultrasound did not show an abscess or evidence of bursitis.  Neurological:     Mental Status: He is alert.     ED Results / Procedures / Treatments   Labs (all labs ordered are listed, but only abnormal results are displayed) Labs Reviewed - No data to display  EKG None  Radiology DG Elbow Complete Left  Result Date: 03/09/2023 CLINICAL DATA:  Left elbow swelling EXAM: LEFT ELBOW - COMPLETE 3+ VIEW COMPARISON:  None Available. FINDINGS: Soft tissue swelling about the left elbow dorsally. No acute fracture or dislocation. No elbow joint effusion. IMPRESSION: Dorsal soft tissue swelling about the elbow without acute osseous abnormality Electronically Signed   By: Minerva Fester M.D.   On: 03/09/2023 01:44    Procedures Procedures    Medications Ordered in ED Medications  oxyCODONE-acetaminophen (PERCOCET/ROXICET) 5-325 MG per tablet 2 tablet (2 tablets Oral Given 03/09/23 0216)  cephALEXin (KEFLEX) capsule 1,000 mg (1,000 mg Oral Given 03/09/23 0216)    ED Course/ Medical Decision Making/ A&P                                 Medical Decision Making Amount and/or Complexity of Data Reviewed Radiology:  ordered.  Risk Prescription drug management.   Patient with diffuse arm cellulitis.  No evidence of sepsis, septic arthritis or septic bursitis at this time.  I suspect this started from this wound on his elbow.  Short course of pain meds, antibiotics anti-inflammatories at home.  Discussed reasons to return to the ER such as not improving appropriately, worsening condition, any Systemic conditions.  Final Clinical Impression(s) / ED Diagnoses Final diagnoses:  Cellulitis of left upper extremity    Rx / DC Orders ED Discharge Orders          Ordered    cephALEXin (KEFLEX) 500 MG capsule  4 times daily        03/09/23 0305    oxyCODONE-acetaminophen (PERCOCET) 5-325 MG tablet  Every 8 hours PRN        03/09/23 0305    naproxen (NAPROSYN) 500 MG tablet  2 times daily        03/09/23 0305              Treshawn Allen, Barbara Cower, MD 03/09/23 [id number]

## 2023-03-09 NOTE — ED Triage Notes (Signed)
Pt states that he had sudden onset of left elbow swelling and pain that has progressively worsened. Left elbow is warm to touch. Pt denies specific injury.

## 2023-03-09 NOTE — Discharge Instructions (Signed)
You have an area of infected skin called cellulitis.  I have started you on antibiotics.  These antibiotics do not work instantaneously.   - You should allow at least 48 hours after your first dose for the cellulitis to slow it's spreading.   - You should allow 48 hours after the first dose to see the cellulitis to start improving.   - These things may happen quicker than that but allow at least these limitations prior to returning to the emergency department or your primary doctor.   - The exceptions to this are if you become systemically ill such as having a fever, nausea, vomiting, malaise.  - The other exception is if you have a rapid spreading of the cellulitis or the redness and swelling and pain.  If you notice more than a couple inches of spreading over a couple hours you need to return to the emergency department immediately because this could be a life-threatening infection. If you notice red streaking from the site you also need to return for reevaluation.  - If not, follow up with your primary doctor as instructed for reevaluation.

## 2023-03-16 ENCOUNTER — Emergency Department (HOSPITAL_BASED_OUTPATIENT_CLINIC_OR_DEPARTMENT_OTHER)
Admission: EM | Admit: 2023-03-16 | Discharge: 2023-03-16 | Disposition: A | Discharge status: Home / Self Care | Payer: BC Managed Care – PPO | Attending: Emergency Medicine | Admitting: Emergency Medicine

## 2023-03-16 ENCOUNTER — Encounter (HOSPITAL_BASED_OUTPATIENT_CLINIC_OR_DEPARTMENT_OTHER): Payer: Self-pay | Admitting: Emergency Medicine

## 2023-03-16 ENCOUNTER — Other Ambulatory Visit: Payer: Self-pay

## 2023-03-16 DIAGNOSIS — L0390 Cellulitis, unspecified: Secondary | ICD-10-CM

## 2023-03-16 DIAGNOSIS — L03114 Cellulitis of left upper limb: Secondary | ICD-10-CM | POA: Diagnosis not present

## 2023-03-16 LAB — BASIC METABOLIC PANEL
Anion gap: 11 (ref 5–15)
BUN: 16 mg/dL (ref 6–20)
CO2: 26 mmol/L (ref 22–32)
Calcium: 9.5 mg/dL (ref 8.9–10.3)
Chloride: 101 mmol/L (ref 98–111)
Creatinine, Ser: 0.96 mg/dL (ref 0.61–1.24)
GFR, Estimated: >60 mL/min (ref >60)
Glucose, Bld: 99 mg/dL (ref 70–99)
Potassium: 3.8 mmol/L (ref 3.5–5.1)
Sodium: 138 mmol/L (ref 135–145)

## 2023-03-16 LAB — CBC WITH DIFFERENTIAL/PLATELET
Abs Immature Granulocytes: 0.08 10*3/uL — ABNORMAL HIGH (ref 0.00–0.07)
Basophils Absolute: 0 10*3/uL (ref 0.0–0.1)
Basophils Relative: 0 %
Eosinophils Absolute: 0.3 10*3/uL (ref 0.0–0.5)
Eosinophils Relative: 3 %
HCT: 44.3 % (ref 39.0–52.0)
Hemoglobin: 14.6 g/dL (ref 13.0–17.0)
Immature Granulocytes: 1 %
Lymphocytes Relative: 28 %
Lymphs Abs: 3 10*3/uL (ref 0.7–4.0)
MCH: 28.9 pg (ref 26.0–34.0)
MCHC: 33 g/dL (ref 30.0–36.0)
MCV: 87.7 fL (ref 80.0–100.0)
Monocytes Absolute: 0.9 10*3/uL (ref 0.1–1.0)
Monocytes Relative: 8 %
Neutro Abs: 6.6 10*3/uL (ref 1.7–7.7)
Neutrophils Relative %: 60 %
Platelets: 411 10*3/uL — ABNORMAL HIGH (ref 150–400)
RBC: 5.05 MIL/uL (ref 4.22–5.81)
RDW: 12.8 % (ref 11.5–15.5)
WBC: 11 10*3/uL — ABNORMAL HIGH (ref 4.0–10.5)
nRBC: 0 % (ref 0.0–0.2)

## 2023-03-16 MED ORDER — SODIUM CHLORIDE 0.9 % IV SOLN: INTRAVENOUS | Status: DC | PRN

## 2023-03-16 MED ORDER — DEXTROSE 5 % IV SOLN
1500.0000 mg | Freq: Once | INTRAVENOUS | Status: AC
  Administered 2023-03-16: 1500 mg via INTRAVENOUS
  Filled 2023-03-16 (×2): qty 75

## 2023-03-16 NOTE — ED Notes (Signed)
Attempted to obtain blood and establish IV access, unsuccessful

## 2023-03-16 NOTE — ED Provider Notes (Signed)
[place] [facility]  Provider Note  CSN: [id number] Arrival date & time: 03/16/23 0057  History Chief Complaint  Patient presents with   Wound Check    Nathan Baird is a 42 y.o. male with history of nonischemic cardiomyopathy and PAF was in the ED about a week ago for cellulitis on L elbow, no signs of septic joint or bursitis then. He had a small abrasion that was felt to be the source. He was given Rx for Keflex which he has been taking as prescribed but he reports worsening swelling more proximally and tenderness. No pain with ROM of elbow. He has not had a fever. No drainage from wound and in fact, that area is improved, today's symptoms are more in the triceps area.    Home Medications Prior to Admission medications   Medication Sig Start Date End Date Taking? Authorizing Provider  carvedilol (COREG) 25 MG tablet Take 1 tablet (25 mg total) by mouth 2 (two) times daily with a meal. 10/30/22   Duke Salvia, MD  cephALEXin (KEFLEX) 500 MG capsule Take 1 capsule (500 mg total) by mouth 4 (four) times daily. 03/09/23   Mesner, Barbara Cower, MD  naproxen (NAPROSYN) 500 MG tablet Take 1 tablet (500 mg total) by mouth 2 (two) times daily. 03/09/23   Mesner, Barbara Cower, MD  oxyCODONE-acetaminophen (PERCOCET) 5-325 MG tablet Take 1-2 tablets by mouth every 8 (eight) hours as needed. 03/09/23   Mesner, Barbara Cower, MD  predniSONE (DELTASONE) 20 MG tablet Take 6 tabs po on day 1 and decrease by 1 tab daily until complete 01/16/23   Shirlee Latch, PA-C  sacubitril-valsartan (ENTRESTO) 49-51 MG Take 1 tablet by mouth 2 (two) times daily. 10/30/22   Duke Salvia, MD  spironolactone (ALDACTONE) 25 MG tablet Take 1 tablet (25 mg total) by mouth daily. 10/30/22   Duke Salvia, MD     Allergies    Patient has no known allergies.   Review of Systems   Review of Systems Please see HPI for pertinent positives and negatives  Physical Exam BP (!) 132/95   Pulse 77   Temp  98.5 F (36.9 C) (Oral)   Resp 18   SpO2 98%   Physical Exam Vitals and nursing note reviewed.  HENT:     Head: Normocephalic.     Nose: Nose normal.  Eyes:     Extraocular Movements: Extraocular movements intact.  Pulmonary:     Effort: Pulmonary effort is normal.  Musculoskeletal:        General: Normal range of motion.     Cervical back: Neck supple.     Comments: Erythema induration and warmth to left triceps area. There is no drainage or fluctuance, FROM without pain. No swelling over the olecranon bursa.   Skin:    Findings: No rash (on exposed skin).  Neurological:     Mental Status: He is alert and oriented to person, place, and time.  Psychiatric:        Mood and Affect: Mood normal.     ED Results / Procedures / Treatments   EKG None  Procedures Procedures  Medications Ordered in the ED Medications  0.9 %  sodium chloride infusion (0 mLs Intravenous Stopped 03/16/23 0511)  dalbavancin (DALVANCE) 1,500 mg in dextrose 5 % 500 mL IVPB (0 mg Intravenous Stopped 03/16/23 0511)    Initial Impression and Plan  Patient here with persistent cellulitis of LUE without signs of septic joint  or bursitis after about a week of taking oral Abx. Vitals are normal, no signs of systemic infection or sepsis. Will check labs, anticipate IV Abx for failure of outpatient therapy.   ED Course   Clinical Course as of 03/16/23 0514  Wynelle Link Mar 16, 2023  0223 CBC with mild leukocytosis.  [CS]  0229 BMP is normal.  [CS]  0239 Discussed treatment options with the patient including admission for IV abx vs treatment with Dalvance here. He would prefer to avoid admission and would like to go with the Dalvance. He is aware that it will need to be couriered to this facility from [facility].  [CS]  [phone] Patient tolerating Dalvance well. Recommend he return in 3 days for a recheck, or sooner if he develops worsening pain, swelling, fever or drainage.  [CS]    Clinical Course User Index [CS] Pollyann Savoy, MD     MDM Rules/Calculators/A&P Medical Decision Making Problems Addressed: Cellulitis of left upper arm: acute illness or injury  Amount and/or Complexity of Data Reviewed Labs: ordered. Decision-making details documented in ED Course.  Risk Prescription drug management. Decision regarding hospitalization.     Final Clinical Impression(s) / ED Diagnoses Final diagnoses:  Cellulitis of left upper arm    Rx / DC Orders ED Discharge Orders          Ordered    Ambulatory referral to Infectious Disease       Comments: Cellulitis patient:  Received dalbavancin on 03/16/2023.   03/16/23 0249             Pollyann Savoy, MD 03/16/23 [phone]

## 2023-03-16 NOTE — ED Triage Notes (Signed)
Pt c/o worsening wound on L elbow. Pt reports he was here on Sunday diagnosed w/ cellulitis and has been taking antibiotic compliantly. Pt reports since wound has become very hard and blisters are in area.

## 2023-03-16 NOTE — ED Notes (Signed)
Discharge instructions discussed with pt. Pt verbalized understanding. Pt stable and ambulatory.  °

## 2023-03-21 LAB — CULTURE, BLOOD (ROUTINE X 2)
Culture: NO GROWTH
Culture: NO GROWTH

## 2023-03-27 ENCOUNTER — Encounter: Payer: Self-pay | Admitting: Internal Medicine

## 2023-03-27 ENCOUNTER — Other Ambulatory Visit: Payer: Self-pay

## 2023-03-27 ENCOUNTER — Ambulatory Visit (INDEPENDENT_AMBULATORY_CARE_PROVIDER_SITE_OTHER): Payer: BC Managed Care – PPO | Admitting: Internal Medicine

## 2023-03-27 VITALS — BP 125/79 | HR 66 | Temp 98.5°F | Ht 74.0 in | Wt 291.0 lb

## 2023-03-27 DIAGNOSIS — L0390 Cellulitis, unspecified: Secondary | ICD-10-CM

## 2023-04-07 ENCOUNTER — Other Ambulatory Visit: Payer: Self-pay | Admitting: Internal Medicine

## 2023-04-11 ENCOUNTER — Other Ambulatory Visit: Payer: Self-pay

## 2023-04-11 ENCOUNTER — Emergency Department (HOSPITAL_BASED_OUTPATIENT_CLINIC_OR_DEPARTMENT_OTHER): Payer: BC Managed Care – PPO

## 2023-04-11 ENCOUNTER — Emergency Department (HOSPITAL_BASED_OUTPATIENT_CLINIC_OR_DEPARTMENT_OTHER)
Admission: EM | Admit: 2023-04-11 | Discharge: 2023-04-11 | Disposition: A | Discharge status: Home / Self Care | Payer: BC Managed Care – PPO | Attending: Emergency Medicine | Admitting: Emergency Medicine

## 2023-04-11 ENCOUNTER — Other Ambulatory Visit (HOSPITAL_BASED_OUTPATIENT_CLINIC_OR_DEPARTMENT_OTHER): Payer: Self-pay

## 2023-04-11 ENCOUNTER — Encounter (HOSPITAL_BASED_OUTPATIENT_CLINIC_OR_DEPARTMENT_OTHER): Payer: Self-pay

## 2023-04-11 DIAGNOSIS — Z8673 Personal history of transient ischemic attack (TIA), and cerebral infarction without residual deficits: Secondary | ICD-10-CM | POA: Diagnosis not present

## 2023-04-11 DIAGNOSIS — R109 Unspecified abdominal pain: Secondary | ICD-10-CM | POA: Diagnosis not present

## 2023-04-11 DIAGNOSIS — I509 Heart failure, unspecified: Secondary | ICD-10-CM | POA: Insufficient documentation

## 2023-04-11 DIAGNOSIS — K429 Umbilical hernia without obstruction or gangrene: Secondary | ICD-10-CM | POA: Diagnosis not present

## 2023-04-11 DIAGNOSIS — K449 Diaphragmatic hernia without obstruction or gangrene: Secondary | ICD-10-CM | POA: Diagnosis not present

## 2023-04-11 LAB — URINALYSIS, ROUTINE W REFLEX MICROSCOPIC
Bilirubin Urine: NEGATIVE
Glucose, UA: NEGATIVE mg/dL
Hgb urine dipstick: NEGATIVE
Ketones, ur: NEGATIVE mg/dL
Leukocytes,Ua: NEGATIVE
Nitrite: NEGATIVE
Protein, ur: NEGATIVE mg/dL
Specific Gravity, Urine: 1.025 (ref 1.005–1.030)
pH: 7 (ref 5.0–8.0)

## 2023-04-11 MED ORDER — OXYCODONE-ACETAMINOPHEN 5-325 MG PO TABS
1.0000 | ORAL_TABLET | Freq: Three times a day (TID) | ORAL | 0 refills | Status: DC | PRN
Start: 2023-04-11 — End: 2023-07-07
  Filled 2023-04-11: qty 6, 1d supply, fill #0

## 2023-04-11 MED ORDER — METHOCARBAMOL 500 MG PO TABS
500.0000 mg | ORAL_TABLET | Freq: Three times a day (TID) | ORAL | 0 refills | Status: DC | PRN
  Filled 2023-04-11: qty 8, 3d supply, fill #0

## 2023-04-11 NOTE — Discharge Instructions (Addendum)
Your workup was reassuring.  Follow-up with your doctor as needed.  The pain medicines and muscle relaxer should help.

## 2023-04-11 NOTE — ED Triage Notes (Signed)
Patient here POV from Home.  Endorses Pain to Right Flank that began at 0330 while en route to work. Worsened since. No Radiation to ABD. No N/V/D. No Constipation. Some Mild Pressure when urinating but no pain.   NAD Noted during triage. A*Ox4. GCS 15. Ambulatory.

## 2023-04-11 NOTE — ED Provider Notes (Signed)
[place] [facility] Provider Note   CSN: [id number] Arrival date & time: 04/11/23  0750     History  Chief Complaint  Patient presents with   Flank Pain    Nathan Baird is a 42 y.o. male.   Flank Pain  Patient presents with right flank pain.  Began last night.  Started while driving around at work.  No fevers.  No definite injury.  Has had some pressure with urinating and feels if he has to go but has not been able to.  Cannot straight find a comfortable position.  No history of kidney stones.    Past Medical History:  Diagnosis Date   CHF (congestive heart failure) (HCC)    Cough    a. PFTS 09/2012: restriction probable, further examinations recommended. Saw pulm 11/2012: placed on GERD rx and short course prednisone, planned to regroup.   CVA (cerebral vascular accident) (HCC) 01/23/2013   tPA administered; MRI brain showed small acute R MCA infarct   Nonischemic cardiomyopathy (HCC)    a. Identified 10/2012 by echo EF 20-25%. b. Echo 01/2013: EF 20-25%, TEE EF 15% with severe RV dysfunction as well.   Paroxysmal atrial fibrillation (HCC)    PFO (patent foramen ovale)    a. By TEE 01/2013.    Home Medications Prior to Admission medications   Medication Sig Start Date End Date Taking? Authorizing Provider  carvedilol (COREG) 25 MG tablet Take 1 tablet (25 mg total) by mouth 2 (two) times daily with a meal. 10/30/22   Duke Salvia, MD  cephALEXin (KEFLEX) 500 MG capsule Take 1 capsule (500 mg total) by mouth 4 (four) times daily. Patient not taking: Reported on 03/27/2023 03/09/23   Mesner, Barbara Cower, MD  naproxen (NAPROSYN) 500 MG tablet Take 1 tablet (500 mg total) by mouth 2 (two) times daily. Patient not taking: Reported on 03/27/2023 03/09/23   Mesner, Barbara Cower, MD  oxyCODONE-acetaminophen (PERCOCET) 5-325 MG tablet Take 1-2 tablets by mouth every 8 (eight) hours as needed. Patient not taking: Reported on 03/27/2023 03/09/23   Mesner, Barbara Cower, MD   predniSONE (DELTASONE) 20 MG tablet Take 6 tabs po on day 1 and decrease by 1 tab daily until complete Patient not taking: Reported on 03/27/2023 01/16/23   Shirlee Latch, PA-C  sacubitril-valsartan (ENTRESTO) 49-51 MG Take 1 tablet by mouth 2 (two) times daily. 10/30/22   Duke Salvia, MD  spironolactone (ALDACTONE) 25 MG tablet Take 1 tablet (25 mg total) by mouth daily. Patient not taking: Reported on 03/27/2023 10/30/22   Duke Salvia, MD      Allergies    Patient has no known allergies.    Review of Systems   Review of Systems  Genitourinary:  Positive for flank pain.    Physical Exam Updated Vital Signs BP (!) 132/93 (BP Location: Right Arm)   Pulse 64   Temp 97.6 F (36.4 C) (Oral)   Resp 20   Ht 6\' 2"  (1.88 m)   Wt 129.3 kg   SpO2 96%   BMI 36.59 kg/m  Physical Exam Vitals and nursing note reviewed.  Cardiovascular:     Rate and Rhythm: Regular rhythm.  Chest:     Chest wall: No tenderness.  Abdominal:     Tenderness: There is no abdominal tenderness.  Genitourinary:    Comments: No CVA tenderness Neurological:     Mental Status: He is alert and oriented to person, place, and time.  ED Results / Procedures / Treatments   Labs (all labs ordered are listed, but only abnormal results are displayed) Labs Reviewed  URINALYSIS, ROUTINE W REFLEX MICROSCOPIC    EKG None  Radiology No results found.  Procedures Procedures    Medications Ordered in ED Medications - No data to display  ED Course/ Medical Decision Making/ A&P                                 Medical Decision Making Amount and/or Complexity of Data Reviewed Labs: ordered. Radiology: ordered.  Risk Prescription drug management.  Patient with right flank pain.  Differential diagnosis includes kidney stone, musculoskeletal pain, retroperitoneal bleed.  Will get urinalysis.  Urinalysis reassuring.  CT scan done to evaluate for kidney stone.  Urinalysis reassuring.  No  infection or hematuria.  CT scan done and also reassuring.  Potentially musculoskeletal pain.  Will treat symptomatically with muscle laxer and pain meds.  Outpatient follow-up as needed.  Will discharge home.        Final Clinical Impression(s) / ED Diagnoses Final diagnoses:  None    Rx / DC Orders ED Discharge Orders     None         Benjiman Core, MD 04/11/23 1038

## 2023-04-21 ENCOUNTER — Ambulatory Visit
Admission: EM | Admit: 2023-04-21 | Discharge: 2023-04-21 | Disposition: A | Discharge status: Home / Self Care | Payer: BC Managed Care – PPO | Attending: Family Medicine | Admitting: Family Medicine

## 2023-04-21 DIAGNOSIS — S161XXA Strain of muscle, fascia and tendon at neck level, initial encounter: Secondary | ICD-10-CM

## 2023-04-21 DIAGNOSIS — S29019A Strain of muscle and tendon of unspecified wall of thorax, initial encounter: Secondary | ICD-10-CM

## 2023-04-21 DIAGNOSIS — R202 Paresthesia of skin: Secondary | ICD-10-CM

## 2023-04-21 MED ORDER — CYCLOBENZAPRINE HCL 10 MG PO TABS: 10.0000 mg | ORAL_TABLET | Freq: Three times a day (TID) | ORAL | 0 refills | Status: DC | PRN

## 2023-04-21 MED ORDER — GABAPENTIN 100 MG PO CAPS: 100.0000 mg | ORAL_CAPSULE | Freq: Three times a day (TID) | ORAL | 0 refills | Status: DC

## 2023-04-21 MED ORDER — NAPROXEN 500 MG PO TABS: 500.0000 mg | ORAL_TABLET | Freq: Two times a day (BID) | ORAL | 0 refills | Status: DC

## 2023-04-21 NOTE — Discharge Instructions (Addendum)
If medication was prescribed, stop by the pharmacy to pick up your prescriptions.  For your  pain, Take 1000 mg Tylenol twice a day, take muscle relaxer (Flexeril) 3 times a day, take Naprosyn twice a day,  as needed for pain. Take Gabapentin at bedtime then increase to 3 times a day as tolerated.  Muscle relaxers and Gabapentin call cause drowsiness. Do not drive or operate heavy machinery while taking these medications.   Rest and elevate the affected painful area.  Apply cold compresses intermittently, as needed.  As pain recedes, begin normal activities slowly as tolerated.  Follow up with primary care provider or an orthopedic provider such as [facility],  if symptoms persist.  Watch for worsening symptoms such as an increasing weakness or loss of sensation, increasing pain and/or the loss of bladder or bowel function. Should any of these occur, go to the emergency department immediately.

## 2023-04-21 NOTE — ED Triage Notes (Signed)
Pt c/o lower back pain & R arm numbness x2 days. Was seen at [facility] ED on 10/4 for the same issue. Was given percocet & robaxin w/o relief. States pain has now radiated to shoulder. Denies any chest pain.

## 2023-04-21 NOTE — ED Provider Notes (Signed)
[facility]    CSN: [id number] Arrival date & time: 04/21/23  1349      History   Chief Complaint Chief Complaint  Patient presents with   Back Pain   Numbness    HPI  HPI Nathan Baird is a 42 y.o. male.   Nathan Baird presents for upper back pain that started 2 days ago whle at work.  Had  low back pain that started 2 weeks ago that resolved. He was seen at the ED and given pain meds and muscle relaxers.    Has new right upper back that radiates to his right arm.  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  He had to leave work due to the pain. Has tingling in hand and at times feels like he is losing strength in his arm.    Took motrin last night but nothing for pain today.  Denies headache, low back pain and neck pain. No chest pain or shortness of breath.     Past Medical History:  Diagnosis Date   CHF (congestive heart failure) (HCC)    Cough    a. PFTS 09/2012: restriction probable, further examinations recommended. Saw pulm 11/2012: placed on GERD rx and short course prednisone, planned to regroup.   CVA (cerebral vascular accident) (HCC) 01/23/2013   tPA administered; MRI brain showed small acute R MCA infarct   Nonischemic cardiomyopathy (HCC)    a. Identified 10/2012 by echo EF 20-25%. b. Echo 01/2013: EF 20-25%, TEE EF 15% with severe RV dysfunction as well.   Paroxysmal atrial fibrillation (HCC)    PFO (patent foramen ovale)    a. By TEE 01/2013.    Patient Active Problem List   Diagnosis Date Noted   ICD (implantable cardioverter-defibrillator) in place 10/11/2019   Chest pain 02/19/2018   NICM (nonischemic cardiomyopathy) (HCC) 01/10/2017   Trapezius muscle strain 11/24/2014   Right shoulder pain 08/24/2014   TIA (transient ischemic attack) 05/20/2014   History of CVA (cerebrovascular accident)    Essential hypertension    HLD (hyperlipidemia)    Encounter for long-term (current) use of high-risk medication 01/27/2013    Patent foramen ovale with right to left shunt 01/27/2013   Stroke, acute, embolic (HCC) 01/25/2013   Cough 10/20/2012   Chronic systolic congestive heart failure (HCC) 10/20/2012   Penile pain 05/19/2012   ATTENTION DEFICIT, W/HYPERACTIVITY 09/04/2006    Past Surgical History:  Procedure Laterality Date   NO PAST SURGERIES     SUBQ ICD IMPLANT N/A 01/10/2017   Procedure: SubQ ICD Implant;  Surgeon: Duke Salvia, MD;  Location: [facility] INVASIVE CV LAB;  Service: Cardiovascular;  Laterality: N/A;   TEE WITHOUT CARDIOVERSION N/A 01/26/2013   Procedure: TRANSESOPHAGEAL ECHOCARDIOGRAM (TEE);  Surgeon: [name], MD;  Location: [facility] ENDOSCOPY;  Service: Cardiovascular;  Laterality: N/A;       Home Medications    Prior to Admission medications   Medication Sig Start Date End Date Taking? Authorizing Provider  carvedilol (COREG) 25 MG tablet Take 1 tablet (25 mg total) by mouth 2 (two) times daily with a meal. 10/30/22  Yes Duke Salvia, MD  cyclobenzaprine (FLEXERIL) 10 MG tablet Take 1 tablet (10 mg total) by mouth 3 (three) times daily as needed for muscle spasms. 04/21/23  Yes Veneda Kirksey, DO  gabapentin (NEURONTIN) 100 MG capsule Take 1 capsule (100 mg total) by mouth 3 (three) times daily. 04/21/23  Yes Shoshanna Mcquitty, Seward Meth, DO  oxyCODONE-acetaminophen (  PERCOCET/ROXICET) 5-325 MG tablet Take 1-2 tablets by mouth every 8 (eight) hours as needed for severe pain. 04/11/23  Yes Benjiman Core, MD  sacubitril-valsartan (ENTRESTO) 49-51 MG Take 1 tablet by mouth 2 (two) times daily. 10/30/22  Yes Duke Salvia, MD  naproxen (NAPROSYN) 500 MG tablet Take 1 tablet (500 mg total) by mouth 2 (two) times daily. 04/21/23   Katha Cabal, DO  spironolactone (ALDACTONE) 25 MG tablet Take 1 tablet (25 mg total) by mouth daily. Patient not taking: Reported on 03/27/2023 10/30/22   Duke Salvia, MD    Family History Family History  Problem Relation Age of Onset   Sarcoidosis Mother    Heart  Problems Mother        PPM implantation age 73    Social History Social History   Tobacco Use   Smoking status: Never   Smokeless tobacco: Never  Vaping Use   Vaping status: Never Used  Substance Use Topics   Alcohol use: Yes    Comment: rare   Drug use: No     Allergies   Patient has no known allergies.   Review of Systems Review of Systems: egative unless otherwise stated in HPI.      Physical Exam Triage Vital Signs ED Triage Vitals  Encounter Vitals Group     BP 04/21/23 1416 (!) 119/95     Systolic BP Percentile --      Diastolic BP Percentile --      Pulse Rate 04/21/23 1416 75     Resp 04/21/23 1416 16     Temp 04/21/23 1416 98.1 F (36.7 C)     Temp Source 04/21/23 1416 Oral     SpO2 04/21/23 1416 99 %     Weight 04/21/23 1414 285 lb (129.3 kg)     Height 04/21/23 1414 6\' 2"  (1.88 m)     Head Circumference --      Peak Flow --      Pain Score 04/21/23 1418 8     Pain Loc --      Pain Education --      Exclude from Growth Chart --    No data found.  Updated Vital Signs BP (!) 119/95 (BP Location: Left Arm)   Pulse 75   Temp 98.1 F (36.7 C) (Oral)   Resp 16   Ht 6\' 2"  (1.88 m)   Wt 129.3 kg   SpO2 99%   BMI 36.59 kg/m   Visual Acuity Right Eye Distance:   Left Eye Distance:   Bilateral Distance:    Right Eye Near:   Left Eye Near:    Bilateral Near:     Physical Exam GEN: well appearing male in no acute distress  CVS: well perfused  RESP: speaking in full sentences without pause, no respiratory distress  MSK: spine: - Inspection: no gross deformity or asymmetry, swelling or ecchymosis. No skin changes  - Palpation: No TTP over the midline spinous processes of the C, T and L spine, right thoracic paraspinal muscle tenderness with hypertonicity and muscle tender point,  - ROM: full passive ROM of the shoulders - Good ROM of thoracic and cervical spine in flexion and extension  - Strength: 5/5 strength of lower extremity in L4-S1  nerve root distributions b/l - Neuro: sensation intact  - Special testing: positive Spurlings  SKIN: warm, dry, no overly skin rash or erythema    UC Treatments / Results  Labs (all labs ordered are listed, but only  abnormal results are displayed) Labs Reviewed - No data to display  EKG   Radiology No results found.   Procedures Procedures (including critical care time)  Medications Ordered in UC Medications - No data to display  Initial Impression / Assessment and Plan / UC Course  I have reviewed the triage vital signs and the nursing notes.  Pertinent labs & imaging results that were available during my care of the patient were reviewed by me and considered in my medical decision making (see chart for details).      Pt is a 42 y.o.  male with acute right upper back pain that started 2 days ago.  Has history of low back pain.     Reviewed recent notes labs and imaging from ED visit on 04/11/2023.  Patient prescribed Percocet and Robaxin.  He states that he flushed the Percocet down the toilet.  He has no recent injury but is having significant myofascial strain of his thoracic spine.  He has multiple trigger points and hypertonicity present.  Imaging deferred.    Patient to gradually return to normal activities, as tolerated and continue ordinary activities within the limits permitted by pain. Prescribed Naproxen sodium, gabapentin and muscle relaxer  for pain relief.  Advised patient to avoid other NSAIDs while taking Naprosyn. Tylenol and Lidocaine patches PRN for multimodal pain relief. Counseled patient on red flag symptoms and when to seek immediate care.  Patient to follow up with orthopedic provider if symptoms do not improve with conservative treatment.  Return and ED precautions given.    Discussed MDM, treatment plan and plan for follow-up with patient who agrees with plan.   Final Clinical Impressions(s) / UC Diagnoses   Final diagnoses:  Paresthesia   Thoracic myofascial strain, initial encounter     Discharge Instructions      If medication was prescribed, stop by the pharmacy to pick up your prescriptions.  For your  pain, Take 1000 mg Tylenol twice a day, take muscle relaxer (Flexeril) 3 times a day, take Naprosyn twice a day,  as needed for pain. Take Gabapentin at bedtime then increase to 3 times a day as tolerated.  Muscle relaxers and Gabapentin call cause drowsiness. Do not drive or operate heavy machinery while taking these medications.   Rest and elevate the affected painful area.  Apply cold compresses intermittently, as needed.  As pain recedes, begin normal activities slowly as tolerated.  Follow up with primary care provider or an orthopedic provider such as [facility],  if symptoms persist.  Watch for worsening symptoms such as an increasing weakness or loss of sensation, increasing pain and/or the loss of bladder or bowel function. Should any of these occur, go to the emergency department immediately.        ED Prescriptions     Medication Sig Dispense Auth. Provider   naproxen (NAPROSYN) 500 MG tablet Take 1 tablet (500 mg total) by mouth 2 (two) times daily. 30 tablet Kalika Smay, DO   cyclobenzaprine (FLEXERIL) 10 MG tablet Take 1 tablet (10 mg total) by mouth 3 (three) times daily as needed for muscle spasms. 30 tablet Jayvan Mcshan, DO   gabapentin (NEURONTIN) 100 MG capsule Take 1 capsule (100 mg total) by mouth 3 (three) times daily. 30 capsule Katha Cabal, DO      PDMP not reviewed this encounter.   Katha Cabal, DO 04/21/23 2108

## 2023-04-23 DIAGNOSIS — M5412 Radiculopathy, cervical region: Secondary | ICD-10-CM | POA: Diagnosis not present

## 2023-07-07 ENCOUNTER — Encounter: Payer: Self-pay | Admitting: *Deleted

## 2023-07-07 ENCOUNTER — Ambulatory Visit
Admission: EM | Admit: 2023-07-07 | Discharge: 2023-07-07 | Disposition: A | Discharge status: Home / Self Care | Payer: BC Managed Care – PPO | Attending: Emergency Medicine | Admitting: Emergency Medicine

## 2023-07-07 DIAGNOSIS — Z113 Encounter for screening for infections with a predominantly sexual mode of transmission: Secondary | ICD-10-CM | POA: Insufficient documentation

## 2023-07-07 DIAGNOSIS — R300 Dysuria: Secondary | ICD-10-CM | POA: Diagnosis not present

## 2023-07-07 DIAGNOSIS — Z202 Contact with and (suspected) exposure to infections with a predominantly sexual mode of transmission: Secondary | ICD-10-CM | POA: Diagnosis not present

## 2023-07-07 LAB — URINALYSIS, W/ REFLEX TO CULTURE (INFECTION SUSPECTED)
Bilirubin Urine: NEGATIVE
Glucose, UA: NEGATIVE mg/dL
Hgb urine dipstick: NEGATIVE
Ketones, ur: NEGATIVE mg/dL
Leukocytes,Ua: NEGATIVE
Nitrite: NEGATIVE
Protein, ur: NEGATIVE mg/dL
RBC / HPF: NONE SEEN RBC/hpf (ref 0–5)
Specific Gravity, Urine: 1.015 (ref 1.005–1.030)
Squamous Epithelial / HPF: NONE SEEN /[HPF] (ref 0–5)
WBC, UA: NONE SEEN WBC/hpf (ref 0–5)
pH: 6 (ref 5.0–8.0)

## 2023-07-07 MED ORDER — METRONIDAZOLE 500 MG PO TABS
2000.0000 mg | ORAL_TABLET | Freq: Once | ORAL | Status: AC
  Administered 2023-07-07: 2000 mg via ORAL

## 2023-07-07 MED ORDER — DOXYCYCLINE HYCLATE 100 MG PO CAPS: 100.0000 mg | ORAL_CAPSULE | Freq: Two times a day (BID) | ORAL | 0 refills | Status: AC

## 2023-07-07 MED ORDER — CEFTRIAXONE SODIUM 500 MG IJ SOLR
500.0000 mg | Freq: Once | INTRAMUSCULAR | Status: AC
  Administered 2023-07-07: 500 mg via INTRAMUSCULAR

## 2023-07-07 MED ORDER — FAMOTIDINE 40 MG PO TABS
40.0000 mg | ORAL_TABLET | Freq: Once | ORAL | Status: AC
  Administered 2023-07-07: 40 mg via ORAL

## 2023-07-07 MED ORDER — ONDANSETRON 8 MG PO TBDP
8.0000 mg | ORAL_TABLET | Freq: Once | ORAL | Status: AC
  Administered 2023-07-07: 8 mg via ORAL

## 2023-07-07 NOTE — ED Provider Notes (Incomplete)
HPI  SUBJECTIVE:  Nathan Baird is a 42 y.o. male who presents with ***  Patient has a past medical history of ischemic CVA, nonischemic cardiomyopathy, CHF, paroxysmal atrial fibrillation, PFO.  Past Medical History:  Diagnosis Date   CHF (congestive heart failure) (HCC)    Cough    a. PFTS 09/2012: restriction probable, further examinations recommended. Saw pulm 11/2012: placed on GERD rx and short course prednisone, planned to regroup.   CVA (cerebral vascular accident) (HCC) 01/23/2013   tPA administered; MRI brain showed small acute R MCA infarct   Nonischemic cardiomyopathy (HCC)    a. Identified 10/2012 by echo EF 20-25%. b. Echo 01/2013: EF 20-25%, TEE EF 15% with severe RV dysfunction as well.   Paroxysmal atrial fibrillation (HCC)    PFO (patent foramen ovale)    a. By TEE 01/2013.    Past Surgical History:  Procedure Laterality Date   NO PAST SURGERIES     SUBQ ICD IMPLANT N/A 01/10/2017   Procedure: SubQ ICD Implant;  Surgeon: Duke Salvia, MD;  Location: [facility] INVASIVE CV LAB;  Service: Cardiovascular;  Laterality: N/A;   TEE WITHOUT CARDIOVERSION N/A 01/26/2013   Procedure: TRANSESOPHAGEAL ECHOCARDIOGRAM (TEE);  Surgeon: [name], MD;  Location: [facility] ENDOSCOPY;  Service: Cardiovascular;  Laterality: N/A;    Family History  Problem Relation Age of Onset   Sarcoidosis Mother    Heart Problems Mother        PPM implantation age 89    Social History   Tobacco Use   Smoking status: Never   Smokeless tobacco: Never  Vaping Use   Vaping status: Never Used  Substance Use Topics   Alcohol use: Yes    Comment: rare   Drug use: No     Current Facility-Administered Medications:    cefTRIAXone (ROCEPHIN) injection 500 mg, 500 mg, Intramuscular, Once, Domenick Gong, MD   famotidine (PEPCID) tablet 40 mg, 40 mg, Oral, Once, Domenick Gong, MD   metroNIDAZOLE (FLAGYL) tablet 2,000 mg, 2,000 mg, Oral, Once, Domenick Gong, MD   ondansetron (ZOFRAN-ODT)  disintegrating tablet 8 mg, 8 mg, Oral, Once, Domenick Gong, MD  Current Outpatient Medications:    doxycycline (VIBRAMYCIN) 100 MG capsule, Take 1 capsule (100 mg total) by mouth 2 (two) times daily for 7 days., Disp: 14 capsule, Rfl: 0   carvedilol (COREG) 25 MG tablet, Take 1 tablet (25 mg total) by mouth 2 (two) times daily with a meal., Disp: 180 tablet, Rfl: 3   naproxen (NAPROSYN) 500 MG tablet, Take 1 tablet (500 mg total) by mouth 2 (two) times daily., Disp: 30 tablet, Rfl: 0   sacubitril-valsartan (ENTRESTO) 49-51 MG, Take 1 tablet by mouth 2 (two) times daily., Disp: 180 tablet, Rfl: 3   spironolactone (ALDACTONE) 25 MG tablet, Take 1 tablet (25 mg total) by mouth daily. (Patient not taking: Reported on 03/27/2023), Disp: 90 tablet, Rfl: 3  No Known Allergies   ROS  As noted in HPI.   Physical Exam  BP 122/83 (BP Location: Right Arm)   Pulse 73   Temp 98 F (36.7 C) (Oral)   Resp 18   Ht 6\' 2"  (1.88 m)   Wt 132.9 kg   SpO2 97%   BMI 37.62 kg/m  *** Constitutional: Well developed, well nourished, no acute distress Eyes:  EOMI, conjunctiva normal bilaterally HENT: Normocephalic, atraumatic,mucus membranes moist Respiratory: Normal inspiratory effort Cardiovascular: Normal rate GI: nondistended soft, nontender.  No suprapubic, flank tenderness. Back: No CVAT GU: Declined Rectal:  Declined skin: No rash, skin intact Musculoskeletal: no deformities Neurologic: Alert & oriented x 3, no focal neuro deficits Psychiatric: Speech and behavior appropriate   ED Course   Medications  cefTRIAXone (ROCEPHIN) injection 500 mg (has no administration in time range)  ondansetron (ZOFRAN-ODT) disintegrating tablet 8 mg (has no administration in time range)  metroNIDAZOLE (FLAGYL) tablet 2,000 mg (has no administration in time range)  famotidine (PEPCID) tablet 40 mg (has no administration in time range)    Orders Placed This Encounter  Procedures   Urine Culture     Standing Status:   Standing    Number of Occurrences:   1    Indication:   Dysuria   Urinalysis, w/ Reflex to Culture (Infection Suspected) -Urine, Clean Catch    Standing Status:   Standing    Number of Occurrences:   1    Specimen Source:   Urine, Clean Catch [76]    Results for orders placed or performed during the hospital encounter of 07/07/23 (from the past 24 hours)  Urinalysis, w/ Reflex to Culture (Infection Suspected) -Urine, Clean Catch     Status: Abnormal   Collection Time: 07/07/23  5:10 PM  Result Value Ref Range   Specimen Source URINE, CLEAN CATCH    Color, Urine YELLOW YELLOW   APPearance CLEAR CLEAR   Specific Gravity, Urine 1.015 1.005 - 1.030   pH 6.0 5.0 - 8.0   Glucose, UA NEGATIVE NEGATIVE mg/dL   Hgb urine dipstick NEGATIVE NEGATIVE   Bilirubin Urine NEGATIVE NEGATIVE   Ketones, ur NEGATIVE NEGATIVE mg/dL   Protein, ur NEGATIVE NEGATIVE mg/dL   Nitrite NEGATIVE NEGATIVE   Leukocytes,Ua NEGATIVE NEGATIVE   Squamous Epithelial / HPF NONE SEEN 0 - 5 /HPF   WBC, UA NONE SEEN 0 - 5 WBC/hpf   RBC / HPF NONE SEEN 0 - 5 RBC/hpf   Bacteria, UA RARE (A) NONE SEEN   No results found.  ED Clinical Impression  1. Screening for STDs (sexually transmitted diseases)   2. Dysuria   3. Exposure to chlamydia   4. Exposure to trichomonas      ED Assessment/Plan   {The patient has been seen in Urgent Care in the last 3 years. :1}  Patient with exposure to chlamydia and trichomonas.  He would like to be empirically treated for all STDs.  Will give him 500 mg of Rocephin IM x 1 as he is less than 150 kg, 2 g of Flagyl, 8 mg of Zofran 40 mg of Pepcid here.  Will send home with 1 week of doxycycline 100 mg p.o. twice daily.  Will send urine off for culture to confirm absence of UTI.  Further treatment off of labs.  Patient declined HIV, syphilis testing. Reviewed imaging independently. *** See radiology report for full details.  Discussed labs, imaging, MDM, treatment  plan, and plan for follow-up with {Blank single:19197::"family","parent","patient"}. Discussed sn/sx that should prompt return to the ED. {Blank single:19197::"family","parent","patient"} agrees with plan.   Meds ordered this encounter  Medications   cefTRIAXone (ROCEPHIN) injection 500 mg   ondansetron (ZOFRAN-ODT) disintegrating tablet 8 mg   metroNIDAZOLE (FLAGYL) tablet 2,000 mg   famotidine (PEPCID) tablet 40 mg   doxycycline (VIBRAMYCIN) 100 MG capsule    Sig: Take 1 capsule (100 mg total) by mouth 2 (two) times daily for 7 days.    Dispense:  14 capsule    Refill:  0      *This clinic note was created using Dragon dictation  software. Therefore, there may be occasional mistakes despite careful proofreading.  ?

## 2023-07-07 NOTE — Discharge Instructions (Signed)
I have sent your urine off for culture to make sure that you do not have a urinary tract infection.  I have treated you empirically for gonorrhea with 500 mg of Rocephin IM, and trichomonas here with 2 g of Flagyl p.o.  The doxycycline will cover chlamydia.  Make sure you finish it, unless you are told to stop by a provider.

## 2023-07-07 NOTE — ED Triage Notes (Signed)
Patient states he was exposed to STIs trich and chlamydia.  Experiencing urinary frequency and pain but no fever.

## 2023-07-08 LAB — CYTOLOGY, (ORAL, ANAL, URETHRAL) ANCILLARY ONLY
Chlamydia: NEGATIVE
Comment: NEGATIVE
Comment: NEGATIVE
Comment: NORMAL
Neisseria Gonorrhea: NEGATIVE
Trichomonas: NEGATIVE

## 2023-07-09 LAB — URINE CULTURE: Culture: NO GROWTH

## 2023-08-13 ENCOUNTER — Ambulatory Visit (INDEPENDENT_AMBULATORY_CARE_PROVIDER_SITE_OTHER): Payer: BLUE CROSS/BLUE SHIELD

## 2023-08-13 DIAGNOSIS — I428 Other cardiomyopathies: Secondary | ICD-10-CM | POA: Diagnosis not present

## 2023-08-18 LAB — CUP PACEART REMOTE DEVICE CHECK
Battery Remaining Percentage: 26 %
Date Time Interrogation Session: 20250205211000
HighPow Impedance: 85 Ohm
Implantable Lead Connection Status: 753985
Implantable Lead Implant Date: 20180706
Implantable Lead Location: 753862
Implantable Lead Model: 3401
Implantable Lead Serial Number: 114025
Implantable Pulse Generator Implant Date: 20180706
Pulse Gen Serial Number: 228369

## 2023-09-08 ENCOUNTER — Encounter: Payer: Self-pay | Admitting: Internal Medicine

## 2023-09-19 NOTE — Progress Notes (Signed)
 Remote ICD transmission.

## 2023-09-19 NOTE — Addendum Note (Signed)
 Addended by: Elease Etienne A on: 09/19/2023 02:16 PM   Modules accepted: Orders

## 2023-11-12 ENCOUNTER — Ambulatory Visit (INDEPENDENT_AMBULATORY_CARE_PROVIDER_SITE_OTHER): Payer: BLUE CROSS/BLUE SHIELD

## 2023-11-12 DIAGNOSIS — I428 Other cardiomyopathies: Secondary | ICD-10-CM

## 2023-11-17 LAB — CUP PACEART REMOTE DEVICE CHECK
Battery Remaining Percentage: 24 %
Date Time Interrogation Session: 20250507170900
HighPow Impedance: 85 Ohm
Implantable Lead Connection Status: 753985
Implantable Lead Implant Date: 20180706
Implantable Lead Location: 753862
Implantable Lead Model: 3401
Implantable Lead Serial Number: 114025
Implantable Pulse Generator Implant Date: 20180706
Pulse Gen Serial Number: 228369

## 2023-11-26 ENCOUNTER — Other Ambulatory Visit: Payer: Self-pay | Admitting: Internal Medicine

## 2023-11-26 DIAGNOSIS — I5022 Chronic systolic (congestive) heart failure: Secondary | ICD-10-CM

## 2023-11-26 DIAGNOSIS — Z8673 Personal history of transient ischemic attack (TIA), and cerebral infarction without residual deficits: Secondary | ICD-10-CM

## 2023-11-27 ENCOUNTER — Other Ambulatory Visit: Payer: Self-pay

## 2023-11-27 DIAGNOSIS — Z95810 Presence of automatic (implantable) cardiac defibrillator: Secondary | ICD-10-CM

## 2023-11-27 DIAGNOSIS — R0683 Snoring: Secondary | ICD-10-CM

## 2023-11-27 DIAGNOSIS — I4891 Unspecified atrial fibrillation: Secondary | ICD-10-CM

## 2023-11-27 DIAGNOSIS — I428 Other cardiomyopathies: Secondary | ICD-10-CM

## 2023-11-27 DIAGNOSIS — I5022 Chronic systolic (congestive) heart failure: Secondary | ICD-10-CM

## 2023-11-27 MED ORDER — CARVEDILOL 25 MG PO TABS
25.0000 mg | ORAL_TABLET | Freq: Two times a day (BID) | ORAL | 0 refills | Status: AC
Start: 2023-11-27 — End: ?

## 2023-11-28 ENCOUNTER — Other Ambulatory Visit: Payer: Self-pay | Admitting: Internal Medicine

## 2023-11-28 DIAGNOSIS — I5022 Chronic systolic (congestive) heart failure: Secondary | ICD-10-CM

## 2023-11-28 DIAGNOSIS — Z8673 Personal history of transient ischemic attack (TIA), and cerebral infarction without residual deficits: Secondary | ICD-10-CM

## 2023-12-03 ENCOUNTER — Ambulatory Visit: Payer: Self-pay | Admitting: Cardiovascular Disease

## 2023-12-21 NOTE — Progress Notes (Deleted)
  Cardiology Office Note:  .   Date:  12/21/2023  ID:  Nathan Baird, DOB 12/22/1980, MRN [id number] PCP: Patient, No Pcp Per  [facility] Providers Cardiologist:  None Electrophysiologist:  Richardo Chandler, MD {  History of Present Illness: .   Nathan Baird is a 43 y.o. male w/PMHx of  TIA, PFO NICM, ICD AFib (noted via device > pt declined OAC)  He last saw Dr. Rodolfo Clan 10/30/22, discussed hx of TIA and AFib found via transmission > though he had declined a/c Again declined a/c this visit Planned for echo to held guide GDMT/management  No visits since + remotes  Has had a few ER visits for non-cardiac issues  Today's visit is scheduled as a 6 month visit ROS:   *** needs to get back on track with cards was nahser *** symptoms *** volume *** + electrode and early depletion advisory (talk to joey)  Device information [employer] S-ICD implanted 01/10/2017  Device is on advisory  Studies Reviewed: Aaron Aas    EKG done today and reviewed by myself:  ***  DEVICE interrogation done today and reviewed by myself *** Battery and lead measurements are good ***  12/04/2022: TTE 1. Left ventricular ejection fraction, by estimation, is 35 to 40%. The  left ventricle has moderately decreased function. The left ventricle  demonstrates global hypokinesis. The left ventricular internal cavity size  was mildly dilated. Left ventricular  diastolic parameters are consistent with Grade I diastolic dysfunction  (impaired relaxation).   2. Right ventricular systolic function is normal. The right ventricular  size is normal.   3. Left atrial size was mild to moderately dilated.   4. The mitral valve is normal in structure. No evidence of mitral valve  regurgitation. No evidence of mitral stenosis.   5. The aortic valve is normal in structure. Aortic valve regurgitation is  not visualized. No aortic stenosis is present.   6. The inferior vena cava is normal in size with greater than 50%   respiratory variability, suggesting right atrial pressure of 3 mmHg.    Risk Assessment/Calculations:    Physical Exam:   VS:  There were no vitals taken for this visit.   Wt Readings from Last 3 Encounters:  07/07/23 293 lb (132.9 kg)  04/21/23 285 lb (129.3 kg)  04/11/23 285 lb (129.3 kg)    GEN: Well nourished, well developed in no acute distress NECK: No JVD; No carotid bruits CARDIAC: ***RRR, no murmurs, rubs, gallops RESPIRATORY:  *** CTA b/l without rales, wheezing or rhonchi  ABDOMEN: Soft, non-tender, non-distended EXTREMITIES: *** No edema; No deformity   ICD site: *** is stable, no thinning, fluctuation, tethering  ASSESSMENT AND PLAN: .    ICD *** intact function *** no programming changes made  AFib (?SCAF) CHA2DS2Vasc is 3, pt has declined a/c ***  NICM Chronic CHF (systolic) *** Needs to get re-established with gen cards team Previously has seen Dr. Alroy Aspen > will need new MD  Secondary hypercoagulable state 2/2 AFib     {Are you ordering a CV Procedure (e.g. stress test, cath, DCCV, TEE, etc)?   Press F2        :[id number]}     Dispo: ***  Signed, Debbie Fails, PA-C

## 2023-12-22 NOTE — Progress Notes (Signed)
 Electrophysiology Office Note:   Date:  12/24/2023  ID:  Nathan Baird, DOB Jan 05, 1981, MRN [id number]  Primary Cardiologist: None Electrophysiologist: Ardeen Kohler, MD      History of Present Illness:   Nathan Baird is a 43 y.o. male with h/o chronic systolic heart failure secondary to NICM s/p ICD, TIA who is being seen today for follow-up evaluation of his cardiac defibrillator.  Discussed the use of AI scribe software for clinical note transcription with the patient, who gave verbal consent to proceed.  History of Present Illness Nathan Baird is a 43 year old male with a defibrillator who presents for device follow-up and management of atrial fibrillation. He was referred by Dr. Rodolfo Clan due to his retirement and the need for ongoing defibrillator management.  He has a defibrillator implanted, which is being monitored regularly. Device checks indicate it is functioning well with an estimated battery life of 1.5 to 2 years remaining. No recent hospitalizations or fluid retention issues. Current medications include carvedilol , spironolactone , and Entresto . He does not currently have a prescription for a diuretic.  The device has detected occasional atrial fibrillation, with a very low burden of less than one day in the past 90 days. He has not been taking a blood thinner regularly, as the atrial fibrillation episodes have been infrequent. He recalls a previous prescription for Eliquis  but has not been using it. Family history includes a maternal history of stroke.  He is considering weight loss options and wants to explore medications like Ozempic to aid in this process.  Review of systems complete and found to be negative unless listed in HPI.   EP Information / Studies Reviewed:    EKG is not ordered today. EKG from 10/30/22 reviewed which showed normal sinus rhythm.      Echo 12/04/22:  1. Left ventricular ejection fraction, by estimation, is 35 to 40%. The  left ventricle has  moderately decreased function. The left ventricle  demonstrates global hypokinesis. The left ventricular internal cavity size  was mildly dilated. Left ventricular  diastolic parameters are consistent with Grade I diastolic dysfunction  (impaired relaxation).   2. Right ventricular systolic function is normal. The right ventricular  size is normal.   3. Left atrial size was mild to moderately dilated.   4. The mitral valve is normal in structure. No evidence of mitral valve  regurgitation. No evidence of mitral stenosis.   5. The aortic valve is normal in structure. Aortic valve regurgitation is  not visualized. No aortic stenosis is present.   6. The inferior vena cava is normal in size with greater than 50%  respiratory variability, suggesting right atrial pressure of 3 mmHg.   Risk Assessment/Calculations:    CHA2DS2-VASc Score = 3   This indicates a 3.2% annual risk of stroke. The patient's score is based upon: CHF History: 1 HTN History: 0 Diabetes History: 0 Stroke History: 2 Vascular Disease History: 0 Age Score: 0 Gender Score: 0             Physical Exam:   VS:  BP 120/82   Pulse 100   Ht 6' 2 (1.88 m)   Wt 290 lb (131.5 kg)   SpO2 94%   BMI 37.23 kg/m    Wt Readings from Last 3 Encounters:  12/23/23 290 lb (131.5 kg)  07/07/23 293 lb (132.9 kg)  04/21/23 285 lb (129.3 kg)     GEN: Well nourished, well developed in no acute distress NECK: No JVD  CARDIAC: Normal rate, regular rhythm RESPIRATORY:  Clear to auscultation without rales, wheezing or rhonchi  ABDOMEN: Soft, non-distended EXTREMITIES:  No edema; No deformity   ASSESSMENT AND PLAN:     # S/p ICD - H/o class I recall-generator and lead -In clinic device interrogation was performed today.  Appropriate device function stable lead parameters for now.  Impedance stable.  Presenting rhythm is ventricular sensed.  No programming changes made today.  Estimated AF burden of 0%.   # Subclinical  atrial fibrillation: # Hypercoagulable state due to atrial fibrillation: -The past 90 days his device estimates 0% burden of atrial fibrillation.  Patient has previously declined anticoagulation.  Eliquis  was prescribed but he did not take.    Risk and benefits of oral anticoagulation for stroke prophylaxis were discussed again with patient and his wife (via phone). He continues to decline oral anticoagulation.  # Chronic systolic heart failure: Well compensated on exam today.  Doing well.  No recent hospitalizations. # NICM -Continue GDMT regimen of Entresto  49-51 mg twice daily, spironolactone  25 mg daily, carvedilol  25 mg twice daily.  Will prescribe Lasix  to be available as needed. - Repeat echocardiogram to evaluate LVEF on GDMT.  # Obesity: BMI 37.  - Patient is interested in GLP-1 medication to assist with weight loss.  We will refer him to our pharmacy colleagues to discuss treatment options.  Follow up with Dr. Daneil Dunker in 6 months  Signed, Ardeen Kohler, MD

## 2023-12-23 ENCOUNTER — Ambulatory Visit: Attending: Cardiology | Admitting: Cardiology

## 2023-12-23 ENCOUNTER — Ambulatory Visit: Admitting: Physician Assistant

## 2023-12-23 ENCOUNTER — Encounter: Payer: Self-pay | Admitting: Cardiology

## 2023-12-23 VITALS — BP 120/82 | HR 100 | Ht 74.0 in | Wt 290.0 lb

## 2023-12-23 DIAGNOSIS — R0683 Snoring: Secondary | ICD-10-CM

## 2023-12-23 DIAGNOSIS — E669 Obesity, unspecified: Secondary | ICD-10-CM

## 2023-12-23 DIAGNOSIS — I428 Other cardiomyopathies: Secondary | ICD-10-CM | POA: Diagnosis not present

## 2023-12-23 DIAGNOSIS — I480 Paroxysmal atrial fibrillation: Secondary | ICD-10-CM

## 2023-12-23 DIAGNOSIS — I5022 Chronic systolic (congestive) heart failure: Secondary | ICD-10-CM

## 2023-12-23 DIAGNOSIS — Z8673 Personal history of transient ischemic attack (TIA), and cerebral infarction without residual deficits: Secondary | ICD-10-CM

## 2023-12-23 DIAGNOSIS — Z79899 Other long term (current) drug therapy: Secondary | ICD-10-CM | POA: Diagnosis not present

## 2023-12-23 DIAGNOSIS — Z95810 Presence of automatic (implantable) cardiac defibrillator: Secondary | ICD-10-CM

## 2023-12-23 DIAGNOSIS — I4891 Unspecified atrial fibrillation: Secondary | ICD-10-CM

## 2023-12-23 LAB — CUP PACEART INCLINIC DEVICE CHECK
Date Time Interrogation Session: 20250617163528
Implantable Lead Connection Status: 753985
Implantable Lead Implant Date: 20180706
Implantable Lead Location: 753862
Implantable Lead Model: 3401
Implantable Lead Serial Number: 114025
Implantable Pulse Generator Implant Date: 20180706
Pulse Gen Serial Number: 228369

## 2023-12-23 MED ORDER — FUROSEMIDE 20 MG PO TABS
20.0000 mg | ORAL_TABLET | Freq: Every day | ORAL | 3 refills | Status: AC | PRN
Start: 2023-12-23 — End: ?

## 2023-12-23 MED ORDER — CARVEDILOL 25 MG PO TABS
25.0000 mg | ORAL_TABLET | Freq: Two times a day (BID) | ORAL | 1 refills | Status: DC
Start: 2023-12-23 — End: 2024-05-07

## 2023-12-23 MED ORDER — ENTRESTO 49-51 MG PO TABS: 1.0000 | ORAL_TABLET | Freq: Two times a day (BID) | ORAL | 1 refills | Status: AC

## 2023-12-23 NOTE — Patient Instructions (Addendum)
 Medication Instructions:  Your physician has recommended you make the following change in your medication:  1) START taking Lasix  (furosemide ) 20 mg once daily as needed   *If you need a refill on your cardiac medications before your next appointment, please call your pharmacy*  Testing/Procedures: Echocardiogram  Your physician has requested that you have an echocardiogram. Echocardiography is a painless test that uses sound waves to create images of your heart. It provides your doctor with information about the size and shape of your heart and how well your heart's chambers and valves are working. This procedure takes approximately one hour. There are no restrictions for this procedure. Please do NOT wear cologne, perfume, aftershave, or lotions (deodorant is allowed). Please arrive 15 minutes prior to your appointment time.  Please note: We ask at that you not bring children with you during ultrasound (echo/ vascular) testing. Due to room size and safety concerns, children are not allowed in the ultrasound rooms during exams. Our front office staff cannot provide observation of children in our lobby area while testing is being conducted. An adult accompanying a patient to their appointment will only be allowed in the ultrasound room at the discretion of the ultrasound technician under special circumstances. We apologize for any inconvenience.  Follow-Up: At [facility], you and your health needs are our priority.  As part of our continuing mission to provide you with exceptional heart care, our providers are all part of one team.  This team includes your primary Cardiologist (physician) and Advanced Practice Providers or APPs (Physician Assistants and Nurse Practitioners) who all work together to provide you with the care you need, when you need it.  Your next appointment:   6 months  Provider:   You will see one of the following Advanced Practice Providers on your designated Care  Team:   Mertha Abrahams, PA-C Michael Andy Tillery, PA-C Suzann Riddle, NP Creighton Doffing, NP

## 2023-12-24 NOTE — Progress Notes (Signed)
 Remote ICD transmission.

## 2023-12-24 NOTE — Addendum Note (Signed)
 Addended by: Lott Rouleau A on: 12/24/2023 08:24 AM   Modules accepted: Orders

## 2023-12-25 ENCOUNTER — Ambulatory Visit: Payer: Self-pay | Admitting: Cardiology

## 2024-01-22 ENCOUNTER — Ambulatory Visit: Attending: Cardiology | Admitting: Pharmacist

## 2024-01-22 ENCOUNTER — Telehealth: Payer: Self-pay | Admitting: Pharmacist

## 2024-01-22 ENCOUNTER — Encounter: Payer: Self-pay | Admitting: Pharmacist

## 2024-01-22 VITALS — Ht 74.0 in | Wt 303.2 lb

## 2024-01-22 DIAGNOSIS — E669 Obesity, unspecified: Secondary | ICD-10-CM

## 2024-01-22 DIAGNOSIS — Z95810 Presence of automatic (implantable) cardiac defibrillator: Secondary | ICD-10-CM

## 2024-01-22 DIAGNOSIS — G459 Transient cerebral ischemic attack, unspecified: Secondary | ICD-10-CM | POA: Diagnosis not present

## 2024-01-22 DIAGNOSIS — Z8673 Personal history of transient ischemic attack (TIA), and cerebral infarction without residual deficits: Secondary | ICD-10-CM

## 2024-01-22 NOTE — Progress Notes (Addendum)
 Patient ID: Nathan Baird                 DOB: Feb 05, 1981                    MRN: [id number]     HPI: Nathan Baird is a 43 y.o. male patient referred to pharmacy clinic by Dr Kennyth to initiate GLP1-RA therapy. PMH is significant for CVA, CHF, HTN, and obesity.   Patient presents today to discuss weight management. Weight has been increasing steadily. Patient believes it is due to his profession. 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 Is currently trying to switch to a shorter route.  Is concerned about his cardiac health since CHF diagnosis. Has a history of CVA and TIA. Has ICD placed.  Last LVEF 30-35%, patient is aysmptomatic.  No SOB or edema.    Is working on diet changes. Concentrating on vegetables and lean proteins now. Has been on this diet for approximately 6 months. Tries to exercise when he is not working.    Labs: Lab Results  Component Value Date   HGBA1C 5.6 04/25/2016    Wt Readings from Last 1 Encounters:  12/23/23 290 lb (131.5 kg)    BP Readings from Last 1 Encounters:  12/23/23 120/82   Pulse Readings from Last 1 Encounters:  12/23/23 100       Component Value Date/Time   CHOL 139 02/21/2018 0522   CHOL 141 10/24/2016 1228   TRIG 96 02/21/2018 0522   HDL 43 02/21/2018 0522   HDL 50 10/24/2016 1228   CHOLHDL 3.2 02/21/2018 0522   VLDL 19 02/21/2018 0522   LDLCALC 77 02/21/2018 0522   LDLCALC 72 10/24/2016 1228    Past Medical History:  Diagnosis Date   CHF (congestive heart failure) (HCC)    Cough    a. PFTS 09/2012: restriction probable, further examinations recommended. Saw pulm 11/2012: placed on GERD rx and short course prednisone , planned to regroup.   CVA (cerebral vascular accident) (HCC) 01/23/2013   tPA administered; MRI brain showed small acute R MCA infarct   Nonischemic cardiomyopathy (HCC)    a. Identified 10/2012 by echo EF 20-25%. b. Echo 01/2013: EF 20-25%, TEE EF 15% with severe RV  dysfunction as well.   Paroxysmal atrial fibrillation (HCC)    PFO (patent foramen ovale)    a. By TEE 01/2013.    Current Outpatient Medications on File Prior to Visit  Medication Sig Dispense Refill   carvedilol  (COREG ) 25 MG tablet Take 1 tablet (25 mg total) by mouth 2 (two) times daily with a meal. 90 tablet 1   furosemide  (LASIX ) 20 MG tablet Take 1 tablet (20 mg total) by mouth daily as needed. 45 tablet 3   naproxen  (NAPROSYN ) 500 MG tablet Take 1 tablet (500 mg total) by mouth 2 (two) times daily. 30 tablet 0   sacubitril -valsartan  (ENTRESTO ) 49-51 MG Take 1 tablet by mouth 2 (two) times daily. 180 tablet 1   spironolactone  (ALDACTONE ) 25 MG tablet Take 1 tablet (25 mg total) by mouth daily. (Patient not taking: Reported on 12/23/2023) 90 tablet 3   No current facility-administered medications on file prior to visit.    No Known Allergies   Assessment/Plan:  1. Obesity - Patient BMI today 38.91 placing him in Class II obesity category. Addition of GLP1a is appropriate due to obesity and cardiac issues. Confirmed patient has no personal or family history of  medullary thyroid  carcinoma (MTC) or Multiple Endocrine Neoplasia syndrome type 2 (MEN 2). Injection technique reviewed at today's visit.  Advised patient on common side effects including nausea, diarrhea, dyspepsia, decreased appetite, and fatigue. Counseled patient on reducing meal size and how to titrate medication to minimize side effects. Counseled patient to call if intolerable side effects or if experiencing dehydration, abdominal pain, or dizziness. Along with pharmacotherapy, the patient will follow dietary modifications and aim for at least 150 minutes of moderate-intensity exercise per week, plus resistance training twice a week (as recommended by the American Heart Association). This resistance training--such as weightlifting, bodyweight exercises, or using resistance bands, adapted to the patient's ability--will help  prevent muscle loss.  Will submit PA and contact patient with result.  Chris Markesia Crilly, PharmD, BCACP, CDCES, CPP [facility] [address] [place], [place] [address] Phone: [phone]; Fax: [phone] 01/22/2024 4:39 PM

## 2024-01-22 NOTE — Telephone Encounter (Signed)
 Please complete PA for [facility] and/or Zepbound. Pt has history of stroke.

## 2024-01-22 NOTE — Patient Instructions (Addendum)
 The medications we discussed today are called Wegovy and Zepbound  I will complete the prior authorizations for both and let you know the response  I recommend going to the manufacturer website and downloading the savings card if the med is approved  Please let me know if you have any questions   Medford Bolk, PharmD, BCACP, CDCES, CPP [facility] [address] [place], [place] [address] Phone: [phone]; Fax: [phone] 01/22/2024 3:31 PM

## 2024-01-23 ENCOUNTER — Telehealth: Payer: Self-pay

## 2024-01-23 ENCOUNTER — Other Ambulatory Visit (HOSPITAL_COMMUNITY): Payer: Self-pay

## 2024-01-23 MED ORDER — WEGOVY 0.25 MG/0.5ML ~~LOC~~ SOAJ: 0.2500 mg | SUBCUTANEOUS | 0 refills | Status: DC

## 2024-01-23 NOTE — Telephone Encounter (Signed)
 Pharmacy Patient Advocate Encounter   Received notification from Physician's Office that prior authorization for [facility] is required/requested.   Insurance verification completed.   The patient is insured through [employer] .   Per test claim: The current 28 day co-pay is, $24.99 (evoucher applied at [facility]).  No PA needed at this time. This test claim was processed through [facility]- copay amounts may vary at other pharmacies due to pharmacy/plan contracts, or as the patient moves through the different stages of their insurance plan.    Claim requires a diagnosis override of SCC 007 (explained in test claim reject)

## 2024-01-28 ENCOUNTER — Other Ambulatory Visit (HOSPITAL_COMMUNITY): Payer: Self-pay

## 2024-01-28 ENCOUNTER — Telehealth: Payer: Self-pay | Admitting: Pharmacy Technician

## 2024-01-28 NOTE — Telephone Encounter (Signed)
 From patient calls     Drug not covered-plan/benefit exclusion

## 2024-02-04 ENCOUNTER — Ambulatory Visit (HOSPITAL_COMMUNITY)
Admission: RE | Admit: 2024-02-04 | Discharge: 2024-02-04 | Disposition: A | Discharge status: Home / Self Care | Source: Ambulatory Visit | Attending: Cardiology | Admitting: Cardiology

## 2024-02-04 DIAGNOSIS — I428 Other cardiomyopathies: Secondary | ICD-10-CM | POA: Diagnosis not present

## 2024-02-04 DIAGNOSIS — I5022 Chronic systolic (congestive) heart failure: Secondary | ICD-10-CM | POA: Diagnosis not present

## 2024-02-04 DIAGNOSIS — I480 Paroxysmal atrial fibrillation: Secondary | ICD-10-CM | POA: Diagnosis not present

## 2024-02-04 LAB — ECHOCARDIOGRAM COMPLETE
Area-P 1/2: 5.6 cm2
S' Lateral: 5.25 cm

## 2024-02-11 ENCOUNTER — Ambulatory Visit (INDEPENDENT_AMBULATORY_CARE_PROVIDER_SITE_OTHER): Payer: BLUE CROSS/BLUE SHIELD

## 2024-02-11 DIAGNOSIS — I428 Other cardiomyopathies: Secondary | ICD-10-CM

## 2024-02-12 LAB — CUP PACEART REMOTE DEVICE CHECK
Battery Remaining Percentage: 21 %
Date Time Interrogation Session: 20250806224800
HighPow Impedance: 100 Ohm
Implantable Lead Connection Status: 753985
Implantable Lead Implant Date: 20180706
Implantable Lead Location: 753862
Implantable Lead Model: 3401
Implantable Lead Serial Number: 114025
Implantable Pulse Generator Implant Date: 20180706
Pulse Gen Serial Number: 228369

## 2024-02-15 ENCOUNTER — Ambulatory Visit: Payer: Self-pay | Admitting: Cardiology

## 2024-02-16 ENCOUNTER — Ambulatory Visit
Admission: EM | Admit: 2024-02-16 | Discharge: 2024-02-16 | Disposition: A | Discharge status: Home / Self Care | Attending: Physician Assistant | Admitting: Physician Assistant

## 2024-02-16 DIAGNOSIS — R350 Frequency of micturition: Secondary | ICD-10-CM

## 2024-02-16 DIAGNOSIS — Z113 Encounter for screening for infections with a predominantly sexual mode of transmission: Secondary | ICD-10-CM | POA: Diagnosis not present

## 2024-02-16 LAB — URINALYSIS, W/ REFLEX TO CULTURE (INFECTION SUSPECTED)
Bacteria, UA: NONE SEEN
Bilirubin Urine: NEGATIVE
Glucose, UA: NEGATIVE mg/dL
Hgb urine dipstick: NEGATIVE
Ketones, ur: NEGATIVE mg/dL
Leukocytes,Ua: NEGATIVE
Nitrite: NEGATIVE
Protein, ur: NEGATIVE mg/dL
RBC / HPF: NONE SEEN RBC/hpf (ref 0–5)
Specific Gravity, Urine: 1.025 (ref 1.005–1.030)
Squamous Epithelial / HPF: NONE SEEN /HPF (ref 0–5)
WBC, UA: NONE SEEN WBC/hpf (ref 0–5)
pH: 5.5 (ref 5.0–8.0)

## 2024-02-16 NOTE — ED Triage Notes (Signed)
 Pt presents to UC for STD testing c/o urinary freq x1 wk. States recent partner tested + for trich.

## 2024-02-16 NOTE — Discharge Instructions (Addendum)
 Your urine was negative for UTI Check my chart for results. Avoid sexual activity until results,treatment known and completed. Safe sex with all future sexual activity. We have sent testing for sexually transmitted infections. We will notify you of any positive results once they are received. If required, we will prescribe any medications you might need. We will not notify you of negative results.   Follow up with PCP. Return as needed.

## 2024-02-16 NOTE — ED Provider Notes (Signed)
 [facility]    CSN: [id number] Arrival date & time: 02/16/24  1412      History   Chief Complaint Chief Complaint  Patient presents with   SEXUALLY TRANSMITTED DISEASE    HPI Nathan Baird is a 43 y.o. male.   43 year old male, Nathan Baird, presents to urgent care for STD testing.  Patient states his partner recently tested positive for trichomoniasis, patient states he has had urinary frequency but denies lesions or discharge.  The history is provided by the patient. No language interpreter was used.    Past Medical History:  Diagnosis Date   CHF (congestive heart failure) (HCC)    Cough    a. PFTS 09/2012: restriction probable, further examinations recommended. Saw pulm 11/2012: placed on GERD rx and short course prednisone , planned to regroup.   CVA (cerebral vascular accident) (HCC) 01/23/2013   tPA administered; MRI brain showed small acute R MCA infarct   Nonischemic cardiomyopathy (HCC)    a. Identified 10/2012 by echo EF 20-25%. b. Echo 01/2013: EF 20-25%, TEE EF 15% with severe RV dysfunction as well.   Paroxysmal atrial fibrillation (HCC)    PFO (patent foramen ovale)    a. By TEE 01/2013.    Patient Active Problem List   Diagnosis Date Noted   Routine screening for STI (sexually transmitted infection) 02/16/2024   Urinary frequency 02/16/2024   ICD (implantable cardioverter-defibrillator) in place 10/11/2019   Chest pain 02/19/2018   NICM (nonischemic cardiomyopathy) (HCC) 01/10/2017   Trapezius muscle strain 11/24/2014   Right shoulder pain 08/24/2014   TIA (transient ischemic attack) 05/20/2014   History of CVA (cerebrovascular accident)    Essential hypertension    HLD (hyperlipidemia)    Encounter for long-term (current) use of high-risk medication 01/27/2013   Patent foramen ovale with right to left shunt 01/27/2013   Stroke, acute, embolic (HCC) 01/25/2013   Cough 10/20/2012   Chronic systolic congestive heart failure (HCC) 10/20/2012    Penile pain 05/19/2012   ATTENTION DEFICIT, W/HYPERACTIVITY 09/04/2006    Past Surgical History:  Procedure Laterality Date   NO PAST SURGERIES     SUBQ ICD IMPLANT N/A 01/10/2017   Procedure: SubQ ICD Implant;  Surgeon: Fernande Elspeth BROCKS, MD;  Location: [facility] INVASIVE CV LAB;  Service: Cardiovascular;  Laterality: N/A;   TEE WITHOUT CARDIOVERSION N/A 01/26/2013   Procedure: TRANSESOPHAGEAL ECHOCARDIOGRAM (TEE);  Surgeon: Redell GORMAN Shallow, MD;  Location: [facility] ENDOSCOPY;  Service: Cardiovascular;  Laterality: N/A;       Home Medications    Prior to Admission medications   Medication Sig Start Date End Date Taking? Authorizing Provider  carvedilol  (COREG ) 25 MG tablet Take 1 tablet (25 mg total) by mouth 2 (two) times daily with a meal. 12/23/23   Kennyth Chew, MD  furosemide  (LASIX ) 20 MG tablet Take 1 tablet (20 mg total) by mouth daily as needed. 12/23/23   Kennyth Chew, MD  naproxen  (NAPROSYN ) 500 MG tablet Take 1 tablet (500 mg total) by mouth 2 (two) times daily. 04/21/23   Brimage, Vondra, DO  sacubitril -valsartan  (ENTRESTO ) 49-51 MG Take 1 tablet by mouth 2 (two) times daily. 12/23/23   Kennyth Chew, MD  Semaglutide -Weight Management (WEGOVY ) 0.25 MG/0.5ML SOAJ Inject 0.25 mg into the skin once a week. 01/23/24   Kennyth Chew, MD  spironolactone  (ALDACTONE ) 25 MG tablet Take 1 tablet (25 mg total) by mouth daily. 10/30/22   Fernande Elspeth BROCKS, MD    Family History Family History  Problem Relation Age  of Onset   Sarcoidosis Mother    Heart Problems Mother        PPM implantation age 59    Social History Social History   Tobacco Use   Smoking status: Never   Smokeless tobacco: Never  Vaping Use   Vaping status: Never Used  Substance Use Topics   Alcohol use: Yes    Comment: rare   Drug use: No     Allergies   Patient has no known allergies.   Review of Systems Review of Systems  Constitutional:  Negative for fever.  Genitourinary:  Positive for frequency. Negative  for genital sores and penile discharge.  All other systems reviewed and are negative.    Physical Exam Triage Vital Signs ED Triage Vitals  Encounter Vitals Group     BP 02/16/24 1421 110/76     Girls Systolic BP Percentile --      Girls Diastolic BP Percentile --      Boys Systolic BP Percentile --      Boys Diastolic BP Percentile --      Pulse Rate 02/16/24 1421 92     Resp 02/16/24 1421 16     Temp 02/16/24 1421 98.8 F (37.1 C)     Temp Source 02/16/24 1421 Oral     SpO2 02/16/24 1421 95 %     Weight 02/16/24 1420 293 lb 11.2 oz (133.2 kg)     Height 02/16/24 1420 6' 2 (1.88 m)     Head Circumference --      Peak Flow --      Pain Score 02/16/24 1429 0     Pain Loc --      Pain Education --      Exclude from Growth Chart --    No data found.  Updated Vital Signs BP 110/76 (BP Location: Right Arm)   Pulse 92   Temp 98.8 F (37.1 C) (Oral)   Resp 16   Ht 6' 2 (1.88 m)   Wt 293 lb 11.2 oz (133.2 kg)   SpO2 95%   BMI 37.71 kg/m   Visual Acuity Right Eye Distance:   Left Eye Distance:   Bilateral Distance:    Right Eye Near:   Left Eye Near:    Bilateral Near:     Physical Exam Vitals and nursing note reviewed.  HENT:     Head: Normocephalic.  Cardiovascular:     Rate and Rhythm: Normal rate.  Pulmonary:     Effort: Pulmonary effort is normal.  Genitourinary:    Comments: Patient self swab, deferred exam Neurological:     General: No focal deficit present.     Mental Status: He is alert and oriented to person, place, and time.     GCS: GCS eye subscore is 4. GCS verbal subscore is 5. GCS motor subscore is 6.  Psychiatric:        Attention and Perception: Attention normal.        Mood and Affect: Mood normal.        Speech: Speech normal.        Behavior: Behavior is cooperative.      UC Treatments / Results  Labs (all labs ordered are listed, but only abnormal results are displayed) Labs Reviewed  URINALYSIS, W/ REFLEX TO CULTURE  (INFECTION SUSPECTED)  CYTOLOGY, (ORAL, ANAL, URETHRAL) ANCILLARY ONLY    EKG   Radiology No results found.  Procedures Procedures (including critical care time)  Medications Ordered in UC Medications -  No data to display  Initial Impression / Assessment and Plan / UC Course  I have reviewed the triage vital signs and the nursing notes.  Pertinent labs & imaging results that were available during my care of the patient were reviewed by me and considered in my medical decision making (see chart for details).    Discussed exam findings and plan of care with patient, strict go to ER precautions given.   Patient verbalized understanding to this provider.  Ddx: Routine STI testing, urinary frequency Final Clinical Impressions(s) / UC Diagnoses   Final diagnoses:  Routine screening for STI (sexually transmitted infection)  Urinary frequency     Discharge Instructions      Your urine was negative for UTI Check my chart for results. Avoid sexual activity until results,treatment known and completed. Safe sex with all future sexual activity. We have sent testing for sexually transmitted infections. We will notify you of any positive results once they are received. If required, we will prescribe any medications you might need. We will not notify you of negative results.   Follow up with PCP. Return as needed.    ED Prescriptions   None    PDMP not reviewed this encounter.   Aminta Loose, NP 02/16/24 2117

## 2024-02-17 ENCOUNTER — Ambulatory Visit (HOSPITAL_COMMUNITY): Payer: Self-pay

## 2024-02-17 LAB — CYTOLOGY, (ORAL, ANAL, URETHRAL) ANCILLARY ONLY
Chlamydia: NEGATIVE
Comment: NEGATIVE
Comment: NEGATIVE
Comment: NORMAL
Neisseria Gonorrhea: NEGATIVE
Trichomonas: POSITIVE — AB

## 2024-02-17 MED ORDER — WEGOVY 0.5 MG/0.5ML ~~LOC~~ SOAJ: 0.5000 mg | SUBCUTANEOUS | 0 refills | Status: DC

## 2024-02-17 MED ORDER — METRONIDAZOLE 500 MG PO TABS: 2000.0000 mg | ORAL_TABLET | Freq: Once | ORAL | 0 refills | Status: AC

## 2024-02-17 NOTE — Addendum Note (Signed)
 Addended by: DARRELL BRUCKNER on: 02/17/2024 08:42 AM   Modules accepted: Orders

## 2024-03-09 NOTE — Progress Notes (Incomplete)
   ADVANCED HEART FAILURE CLINIC NOTE  Referring Physician: No ref. provider found  Primary Care: Patient, No Pcp Per Primary Cardiologist: Heart Failure: Ria Commander, DO  CC: [id number]  HPI: Nathan Baird is a 43 y.o. male with chronic systolic heart failure 2/2 NICM s/p ICD, TIA  Pertinent Family & social hx:   Cardiac History:     Interval hx:        PHYSICAL EXAM: There were no vitals filed for this visit. Lungs- *** CARDIAC:  JVP: *** cm          Normal rate with regular rhythm. *** murmur.  Pulses ***. *** edema.  ABDOMEN: soft EXTREMITIES: Warm and well perfused.   DATA REVIEW  ECG: ***  As per my personal interpretation  ECHO: ***  CATH: ***    ASSESSMENT & PLAN:  Heart Failure with *** fraction - Etiology & History: *** - NYHA Class: *** - Volume status: *** - GDMT:  -  RAASi: *** Beta-blocker: *** -  Hydralazine/Nitrates: *** -  SGLT2i: *** - ICD: *** - Advanced therapies: ***  I spent *** minutes in the care of Nathan Baird today including {CHL AMB CAR Time Based Billing Options STW (Optional):[phone]::documenting in the encounter.}   Anda Sobotta Advanced Heart Failure Mechanical Circulatory Support

## 2024-03-10 ENCOUNTER — Ambulatory Visit
Admission: EM | Admit: 2024-03-10 | Discharge: 2024-03-10 | Disposition: A | Discharge status: Home / Self Care | Attending: Nurse Practitioner | Admitting: Nurse Practitioner

## 2024-03-10 ENCOUNTER — Encounter (HOSPITAL_COMMUNITY): Admitting: Cardiology

## 2024-03-10 DIAGNOSIS — M5412 Radiculopathy, cervical region: Secondary | ICD-10-CM

## 2024-03-10 MED ORDER — NAPROXEN 500 MG PO TABS: 500.0000 mg | ORAL_TABLET | Freq: Two times a day (BID) | ORAL | 0 refills | Status: AC

## 2024-03-10 MED ORDER — DEXAMETHASONE SODIUM PHOSPHATE 10 MG/ML IJ SOLN
10.0000 mg | Freq: Once | INTRAMUSCULAR | Status: AC
  Administered 2024-03-10: 10 mg via INTRAMUSCULAR

## 2024-03-10 MED ORDER — CYCLOBENZAPRINE HCL 10 MG PO TABS: 10.0000 mg | ORAL_TABLET | Freq: Every day | ORAL | 0 refills | Status: AC

## 2024-03-10 MED ORDER — METHYLPREDNISOLONE 4 MG PO TBPK
ORAL_TABLET | ORAL | 0 refills | Status: AC
Start: 2024-03-11 — End: ?

## 2024-03-10 MED ORDER — KETOROLAC TROMETHAMINE 60 MG/2ML IM SOLN
60.0000 mg | Freq: Once | INTRAMUSCULAR | Status: AC
  Administered 2024-03-10: 60 mg via INTRAMUSCULAR

## 2024-03-10 NOTE — Discharge Instructions (Addendum)
 You were seen today for pain that began in your upper back and has moved into your right shoulder and right upper arm. Your symptoms are most consistent with cervical radiculopathy, which occurs when a nerve in the neck is irritated or pinched, often from a bulging disc or muscle strain. You received injections of Toradol  and Decadron  in the clinic for pain and inflammation. Starting tomorrow morning, you should begin a steroids as prescribed and also take naproxen  twice daily with food. Do not take any other NSAIDs, such as ibuprofen  or Aleve , while on naproxen . You may take Tylenol  if you need additional pain relief. A muscle relaxer has also been prescribed to take at bedtime to help with nighttime pain and spasms.  At home, you should use a neck-supporting pillow when resting or sleeping to keep your spine aligned. Applying moist heat, such as a warm washcloth with a heating pad on low, may help relieve discomfort.  Gentle stretching and avoiding heavy lifting or awkward neck positions may also reduce irritation.  Follow up with your primary care provider if your symptoms do not begin to improve within several days. If your pain continues or worsens, you may need to see an orthopedic specialist for further evaluation, which may include an MRI. Go to the emergency department right away if you develop sudden or severe weakness, new or worsening numbness, loss of bladder or bowel control, or inability to move your arm normally.

## 2024-03-10 NOTE — ED Triage Notes (Signed)
 Pt c/o sharp upper back pain radiating down R arm x4 days. States was cleaning some windows & felt weakness in arm. Has tried OTC meds w/o relief.

## 2024-03-10 NOTE — ED Provider Notes (Signed)
 [facility]    CSN: [id number] Arrival date & time: 03/10/24  1729      History   Chief Complaint Chief Complaint  Patient presents with   Back Pain    HPI Nathan Baird is a 43 y.o. male.   Discussed the use of AI scribe software for clinical note transcription with the patient, who gave verbal consent to proceed.   The patient presents with upper back and neck pain that has been consistent for a couple of days. The pain initially started in the upper back, then moved to the shoulder, and now extends to the right arm. The patient reports throbbing pain and difficulty sleeping at night. 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  The pain is located across the entire upper back, right underneath the neck, and radiates down to the right arm. Today, the patient is experiencing numbness in the arm, which stops midway. The patient believes they woke up with the pain, though they cannot recall any specific incident that may have triggered it. They report neck stiffness in the upper area.  The patient has attempted to manage the pain with 600 milligrams of ibuprofen  and muscle relaxers, but these interventions have not provided relief. The pain is impacting their daily functioning, as evidenced by their difficulty in performing work-related tasks and inability to sleep at night.      Past Medical History:  Diagnosis Date   CHF (congestive heart failure) (HCC)    Cough    a. PFTS 09/2012: restriction probable, further examinations recommended. Saw pulm 11/2012: placed on GERD rx and short course prednisone , planned to regroup.   CVA (cerebral vascular accident) (HCC) 01/23/2013   tPA administered; MRI brain showed small acute R MCA infarct   Nonischemic cardiomyopathy (HCC)    a. Identified 10/2012 by echo EF 20-25%. b. Echo 01/2013: EF  20-25%, TEE EF 15% with severe RV dysfunction as well.   Paroxysmal atrial fibrillation (HCC)    PFO (patent foramen ovale)    a. By TEE 01/2013.    Patient Active Problem List   Diagnosis Date Noted   Routine screening for STI (sexually transmitted infection) 02/16/2024   Urinary frequency 02/16/2024   ICD (implantable cardioverter-defibrillator) in place 10/11/2019   Chest pain 02/19/2018   NICM (nonischemic cardiomyopathy) (HCC) 01/10/2017   Trapezius muscle strain 11/24/2014   Combined hyperlipidemia 09/20/2014   Heart palpitations 09/20/2014   LV non-compaction cardiomyopathy (HCC) 09/20/2014   Right shoulder pain 08/24/2014   TIA (transient ischemic attack) 05/20/2014   History of CVA (cerebrovascular accident)    Essential hypertension    Encounter for long-term (current) use of high-risk medication 01/27/2013   PFO (patent foramen ovale) 01/27/2013   Stroke, acute, embolic (HCC) 01/25/2013   Cough 10/20/2012   Chronic systolic congestive heart failure (HCC) 10/20/2012   Penile pain 05/19/2012   ATTENTION DEFICIT, W/HYPERACTIVITY 09/04/2006    Past Surgical History:  Procedure Laterality Date   NO PAST SURGERIES     SUBQ ICD IMPLANT N/A 01/10/2017   Procedure: SubQ ICD Implant;  Surgeon: Fernande Elspeth BROCKS, MD;  Location: [facility] INVASIVE CV LAB;  Service: Cardiovascular;  Laterality: N/A;   TEE WITHOUT CARDIOVERSION N/A 01/26/2013   Procedure: TRANSESOPHAGEAL ECHOCARDIOGRAM (TEE);  Surgeon: Redell GORMAN Shallow, MD;  Location: [facility] ENDOSCOPY;  Service: Cardiovascular;  Laterality: N/A;       Home Medications    Prior to Admission medications   Medication Sig Start Date End Date Taking? Authorizing Provider  cyclobenzaprine  (FLEXERIL ) 10 MG tablet Take 1 tablet (10 mg total) by mouth at bedtime. 03/10/24  Yes Iola Lukes, FNP  methylPREDNISolone  (MEDROL  DOSEPAK) 4 MG TBPK tablet Take as directed 03/11/24  Yes Iola Lukes, FNP  naproxen  (NAPROSYN ) 500 MG tablet Take 1  tablet (500 mg total) by mouth 2 (two) times daily with a meal. Take with food to avoid stomach upset. Do not take any additional NSAIDs while on this. You may take tylenol  in addition to this if needed for extra pain relief. 03/11/24  Yes Iola Lukes, FNP  carvedilol  (COREG ) 25 MG tablet Take 1 tablet (25 mg total) by mouth 2 (two) times daily with a meal. 12/23/23   Kennyth Chew, MD  furosemide  (LASIX ) 20 MG tablet Take 1 tablet (20 mg total) by mouth daily as needed. 12/23/23   Kennyth Chew, MD  sacubitril -valsartan  (ENTRESTO ) 49-51 MG Take 1 tablet by mouth 2 (two) times daily. 12/23/23   Kennyth Chew, MD  semaglutide -weight management (WEGOVY ) 0.5 MG/0.5ML SOAJ SQ injection Inject 0.5 mg into the skin once a week. 02/17/24   Kennyth Chew, MD  spironolactone  (ALDACTONE ) 25 MG tablet Take 1 tablet (25 mg total) by mouth daily. 10/30/22   Fernande Elspeth BROCKS, MD    Family History Family History  Problem Relation Age of Onset   Sarcoidosis Mother    Heart Problems Mother        PPM implantation age 59    Social History Social History   Tobacco Use   Smoking status: Never   Smokeless tobacco: Never  Vaping Use   Vaping status: Never Used  Substance Use Topics   Alcohol use: Yes    Comment: rare   Drug use: No     Allergies   Patient has no known allergies.   Review of Systems Review of Systems  Musculoskeletal:  Positive for arthralgias, back pain and neck pain. Negative for joint swelling.  Neurological:  Positive for numbness. Negative for weakness.  All other systems reviewed and are negative.    Physical Exam Triage Vital Signs ED Triage Vitals  Encounter Vitals Group     BP 03/10/24 1829 (!) 130/100     Girls Systolic BP Percentile --      Girls Diastolic BP Percentile --      Boys Systolic BP Percentile --      Boys Diastolic BP Percentile --      Pulse Rate 03/10/24 1829 70     Resp 03/10/24 1829 16     Temp 03/10/24 1829 98.5 F (36.9 C)     Temp  Source 03/10/24 1829 Oral     SpO2 03/10/24 1829 97 %     Weight 03/10/24 1828 290 lb (131.5 kg)     Height 03/10/24 1828 6' 2 (1.88 m)     Head Circumference --      Peak Flow --      Pain Score 03/10/24 1834 9     Pain Loc --      Pain Education --      Exclude from Growth Chart --    No data found.  Updated Vital Signs BP (!) 130/100 (BP Location: Left Arm)   Pulse 70   Temp 98.5 F (36.9 C) (Oral)   Resp 16   Ht 6' 2 (1.88 m)  Wt 290 lb (131.5 kg)   SpO2 97%   BMI 37.23 kg/m   Visual Acuity Right Eye Distance:   Left Eye Distance:   Bilateral Distance:    Right Eye Near:   Left Eye Near:    Bilateral Near:     Physical Exam Vitals reviewed.  Constitutional:      General: He is not in acute distress.    Appearance: Normal appearance. He is not ill-appearing, toxic-appearing or diaphoretic.  HENT:     Head: Normocephalic.     Mouth/Throat:     Mouth: Mucous membranes are moist.  Cardiovascular:     Rate and Rhythm: Normal rate and regular rhythm.  Pulmonary:     Effort: Pulmonary effort is normal.     Breath sounds: Normal breath sounds.  Abdominal:     Palpations: Abdomen is soft.     Tenderness: There is no right CVA tenderness or left CVA tenderness.  Musculoskeletal:        General: Normal range of motion.     Cervical back: Normal, normal range of motion and neck supple. No edema, erythema, rigidity, torticollis or crepitus. Muscular tenderness present. No pain with movement or spinous process tenderness. Normal range of motion.     Thoracic back: Normal.     Lumbar back: Normal.       Back:     Comments: Tenderness is noted in the right paraspinal cervical region with radiation into the right side of the neck and posterior shoulder. The cervical spine demonstrates full range of motion, strength, and sensation, with no vertebral tenderness or abnormalities observed. The right upper extremity shows full range of motion, strength, and sensation, with  grip strength strong and equal bilaterally. No focal neurological deficits are identified.  Lymphadenopathy:     Cervical: No cervical adenopathy.  Skin:    General: Skin is warm and dry.  Neurological:     General: No focal deficit present.     Mental Status: He is alert and oriented to person, place, and time.     Cranial Nerves: Cranial nerves 2-12 are intact.     Sensory: Sensation is intact.     Motor: Motor function is intact. No weakness.     Coordination: Coordination is intact.     Gait: Gait is intact.  Psychiatric:        Mood and Affect: Mood normal.        Speech: Speech normal.        Behavior: Behavior normal. Behavior is cooperative.      UC Treatments / Results  Labs (all labs ordered are listed, but only abnormal results are displayed) Labs Reviewed - No data to display  EKG   Radiology No results found.  Procedures Procedures (including critical care time)  Medications Ordered in UC Medications  ketorolac  (TORADOL ) injection 60 mg (60 mg Intramuscular Given 03/10/24 1926)  dexamethasone  (DECADRON ) injection 10 mg (10 mg Intramuscular Given 03/10/24 1928)    Initial Impression / Assessment and Plan / UC Course  I have reviewed the triage vital signs and the nursing notes.  Pertinent labs & imaging results that were available during my care of the patient were reviewed by me and considered in my medical decision making (see chart for details).     The patient presents with a 4-day history of pain beginning in the upper back, which has progressed with radiation into the right shoulder, and now affecting the right upper arm. The pain is throbbing  and associated with weakness, numbness, difficulty lifting the arm, and trouble sleeping. Examination shows intact strength, sensation, and range of motion of the cervical spine and upper extremities. Findings are most consistent with cervical radiculopathy, likely secondary to a pinched nerve or bulging cervical  disc. The patient was treated in clinic with decadron  and toradol  injections and prescribed naproxen  twice daily, with instructions to avoid other NSAIDs while taking this medication but may use Tylenol  for additional pain control. A Medrol  Dosepak was prescribed to start in the morning, and a muscle relaxer was provided for nighttime use. Supportive care was recommended, including use of a neck-supporting pillow and application of moist heat several times a day. The patient was advised to follow up with Orthopedics if symptoms persist for further evaluation, including possible MRI, and to seek emergency care for worsening neurological symptoms, such as significant weakness, loss of sensation, or loss of bladder or bowel control.  Today's evaluation has revealed no signs of a dangerous process. Discussed diagnosis with patient and/or guardian. Patient and/or guardian aware of their diagnosis, possible red flag symptoms to watch out for and need for close follow up. Patient and/or guardian understands verbal and written discharge instructions. Patient and/or guardian comfortable with plan and disposition.  Patient and/or guardian has a clear mental status at this time, good insight into illness (after discussion and teaching) and has clear judgment to make decisions regarding their care  Documentation was completed with the aid of voice recognition software. Transcription may contain typographical errors. Final Clinical Impressions(s) / UC Diagnoses   Final diagnoses:  Right cervical radiculopathy     Discharge Instructions      You were seen today for pain that began in your upper back and has moved into your right shoulder and right upper arm. Your symptoms are most consistent with cervical radiculopathy, which occurs when a nerve in the neck is irritated or pinched, often from a bulging disc or muscle strain. You received injections of Toradol  and Decadron  in the clinic for pain and inflammation.  Starting tomorrow morning, you should begin a steroids as prescribed and also take naproxen  twice daily with food. Do not take any other NSAIDs, such as ibuprofen  or Aleve , while on naproxen . You may take Tylenol  if you need additional pain relief. A muscle relaxer has also been prescribed to take at bedtime to help with nighttime pain and spasms.  At home, you should use a neck-supporting pillow when resting or sleeping to keep your spine aligned. Applying moist heat, such as a warm washcloth with a heating pad on low, may help relieve discomfort.  Gentle stretching and avoiding heavy lifting or awkward neck positions may also reduce irritation.  Follow up with your primary care provider if your symptoms do not begin to improve within several days. If your pain continues or worsens, you may need to see an orthopedic specialist for further evaluation, which may include an MRI. Go to the emergency department right away if you develop sudden or severe weakness, new or worsening numbness, loss of bladder or bowel control, or inability to move your arm normally.      ED Prescriptions     Medication Sig Dispense Auth. Provider   naproxen  (NAPROSYN ) 500 MG tablet Take 1 tablet (500 mg total) by mouth 2 (two) times daily with a meal. Take with food to avoid stomach upset. Do not take any additional NSAIDs while on this. You may take tylenol  in addition to this if needed for extra  pain relief. 20 tablet Iola Lukes, FNP   cyclobenzaprine  (FLEXERIL ) 10 MG tablet Take 1 tablet (10 mg total) by mouth at bedtime. 10 tablet Iola Lukes, FNP   methylPREDNISolone  (MEDROL  DOSEPAK) 4 MG TBPK tablet Take as directed 21 tablet Iola Lukes, FNP      PDMP not reviewed this encounter.   Iola Lukes, [place] 03/10/24 [phone]

## 2024-03-19 ENCOUNTER — Encounter: Payer: Self-pay | Admitting: Pharmacist

## 2024-03-19 DIAGNOSIS — G459 Transient cerebral ischemic attack, unspecified: Secondary | ICD-10-CM

## 2024-03-19 DIAGNOSIS — E669 Obesity, unspecified: Secondary | ICD-10-CM

## 2024-03-19 MED ORDER — WEGOVY 1 MG/0.5ML ~~LOC~~ SOAJ: 1.0000 mg | SUBCUTANEOUS | 0 refills | Status: AC

## 2024-03-20 ENCOUNTER — Encounter (HOSPITAL_BASED_OUTPATIENT_CLINIC_OR_DEPARTMENT_OTHER): Payer: Self-pay | Admitting: Urology

## 2024-03-20 ENCOUNTER — Other Ambulatory Visit: Payer: Self-pay

## 2024-03-20 ENCOUNTER — Emergency Department (HOSPITAL_BASED_OUTPATIENT_CLINIC_OR_DEPARTMENT_OTHER)
Admission: EM | Admit: 2024-03-20 | Discharge: 2024-03-21 | Disposition: A | Discharge status: Home / Self Care | Attending: Emergency Medicine | Admitting: Emergency Medicine

## 2024-03-20 DIAGNOSIS — M5412 Radiculopathy, cervical region: Secondary | ICD-10-CM | POA: Insufficient documentation

## 2024-03-20 DIAGNOSIS — M542 Cervicalgia: Secondary | ICD-10-CM | POA: Diagnosis not present

## 2024-03-20 NOTE — ED Triage Notes (Signed)
 Pt states reoccurring neck and back pain  States poterior neck pain, radiating down right arm and numbness to right hand fingers  States chronic pain   Took naproxen  with no relief

## 2024-03-21 ENCOUNTER — Emergency Department (HOSPITAL_BASED_OUTPATIENT_CLINIC_OR_DEPARTMENT_OTHER)

## 2024-03-21 DIAGNOSIS — M4722 Other spondylosis with radiculopathy, cervical region: Secondary | ICD-10-CM | POA: Diagnosis not present

## 2024-03-21 DIAGNOSIS — M50121 Cervical disc disorder at C4-C5 level with radiculopathy: Secondary | ICD-10-CM | POA: Diagnosis not present

## 2024-03-21 DIAGNOSIS — M4802 Spinal stenosis, cervical region: Secondary | ICD-10-CM | POA: Diagnosis not present

## 2024-03-21 DIAGNOSIS — M79601 Pain in right arm: Secondary | ICD-10-CM | POA: Diagnosis not present

## 2024-03-21 MED ORDER — OXYCODONE-ACETAMINOPHEN 5-325 MG PO TABS
1.0000 | ORAL_TABLET | Freq: Once | ORAL | Status: AC
  Administered 2024-03-21: 1 via ORAL
  Filled 2024-03-21: qty 1

## 2024-03-21 MED ORDER — HYDROCODONE-ACETAMINOPHEN 5-325 MG PO TABS: 1.0000 | ORAL_TABLET | ORAL | 0 refills | Status: AC | PRN

## 2024-03-21 NOTE — ED Provider Notes (Signed)
 [place] [facility] Provider Note   CSN: [id number] Arrival date & time: 03/20/24  2346     Patient presents with: Pain   Nathan Baird is a 43 y.o. male.   Patient presents to the emergency department for evaluation of neck pain.  Patient reports that symptoms have been ongoing for about 2 weeks.  He was seen at urgent care, given a shot and started on steroids.  He reports that it did not help.  Patient having pain from the right side of his neck down his right arm.  He has tingling of his 2nd and 3rd fingers on the right side.  No weakness.  Denies any injury.       Prior to Admission medications   Medication Sig Start Date End Date Taking? Authorizing Provider  HYDROcodone -acetaminophen  (NORCO/VICODIN) 5-325 MG tablet Take 1 tablet by mouth every 4 (four) hours as needed for moderate pain (pain score 4-6). 03/21/24  Yes Lesley Galentine, Lonni JINNY, MD  carvedilol  (COREG ) 25 MG tablet Take 1 tablet (25 mg total) by mouth 2 (two) times daily with a meal. 12/23/23   Kennyth Chew, MD  cyclobenzaprine  (FLEXERIL ) 10 MG tablet Take 1 tablet (10 mg total) by mouth at bedtime. 03/10/24   Murrill, Samantha, FNP  furosemide  (LASIX ) 20 MG tablet Take 1 tablet (20 mg total) by mouth daily as needed. 12/23/23   Kennyth Chew, MD  methylPREDNISolone  (MEDROL  DOSEPAK) 4 MG TBPK tablet Take as directed 03/11/24   Iola Lukes, FNP  naproxen  (NAPROSYN ) 500 MG tablet Take 1 tablet (500 mg total) by mouth 2 (two) times daily with a meal. Take with food to avoid stomach upset. Do not take any additional NSAIDs while on this. You may take tylenol  in addition to this if needed for extra pain relief. 03/11/24   Murrill, Samantha, FNP  sacubitril -valsartan  (ENTRESTO ) 49-51 MG Take 1 tablet by mouth 2 (two) times daily. 12/23/23   Kennyth Chew, MD  semaglutide -weight management (WEGOVY ) 1 MG/0.5ML SOAJ SQ injection Inject 1 mg into the skin once a week. 03/19/24   Kennyth Chew, MD  spironolactone  (ALDACTONE ) 25 MG tablet Take 1 tablet (25 mg total) by mouth daily. 10/30/22   Fernande Elspeth BROCKS, MD    Allergies: Patient has no known allergies.    Review of Systems  Updated Vital Signs BP (!) 128/97 (BP Location: Right Arm)   Pulse 70   Temp 98.2 F (36.8 C) (Oral)   Resp 18   Ht 6' 2 (1.88 m)   Wt 132 kg   SpO2 98%   BMI 37.36 kg/m   Physical Exam Vitals and nursing note reviewed.  Constitutional:      General: He is not in acute distress.    Appearance: He is well-developed.  HENT:     Head: Normocephalic and atraumatic.     Mouth/Throat:     Mouth: Mucous membranes are moist.  Eyes:     General: Vision grossly intact. Gaze aligned appropriately.     Extraocular Movements: Extraocular movements intact.     Conjunctiva/sclera: Conjunctivae normal.  Neck:   Cardiovascular:     Rate and Rhythm: Normal rate and regular rhythm.     Pulses: Normal pulses.     Heart sounds: Normal heart sounds, S1 normal and S2 normal. No murmur heard.    No friction rub. No gallop.  Pulmonary:     Effort: Pulmonary effort is normal. No respiratory distress.     Breath  sounds: Normal breath sounds.  Abdominal:     Palpations: Abdomen is soft.     Tenderness: There is no abdominal tenderness. There is no guarding or rebound.     Hernia: No hernia is present.  Musculoskeletal:        General: No swelling.     Cervical back: Full passive range of motion without pain, normal range of motion and neck supple. No pain with movement, spinous process tenderness or muscular tenderness. Normal range of motion.     Right lower leg: No edema.     Left lower leg: No edema.  Skin:    General: Skin is warm and dry.     Capillary Refill: Capillary refill takes less than 2 seconds.     Findings: No ecchymosis, erythema, lesion or wound.  Neurological:     Mental Status: He is alert and oriented to person, place, and time.     GCS: GCS eye subscore is 4. GCS verbal  subscore is 5. GCS motor subscore is 6.     Cranial Nerves: Cranial nerves 2-12 are intact.     Sensory: Sensation is intact.     Motor: Motor function is intact. No weakness or abnormal muscle tone.     Coordination: Coordination is intact.  Psychiatric:        Mood and Affect: Mood normal.        Speech: Speech normal.        Behavior: Behavior normal.     (all labs ordered are listed, but only abnormal results are displayed) Labs Reviewed - No data to display  EKG: None  Radiology: CT CERVICAL SPINE WO CONTRAST Result Date: 03/21/2024 EXAM: CT CERVICAL SPINE WITHOUT CONTRAST 03/21/2024 12:45:00 AM TECHNIQUE: CT of the cervical spine was performed without the administration of intravenous contrast. Multiplanar reformatted images are provided for review. Automated exposure control, iterative reconstruction, and/or weight based adjustment of the mA/kV was utilized to reduce the radiation dose to as low as reasonably achievable. COMPARISON: None available. CLINICAL HISTORY: Cervical radiculopathy, no red flags. Pt states reoccurring neck and back pain; states posterior neck pain, radiating down right arm and numbness to right hand fingers; states chronic pain. FINDINGS: CERVICAL SPINE: BONES AND ALIGNMENT: No acute fracture or traumatic malalignment. DEGENERATIVE CHANGES: Disc space narrowing and endplate remodeling at C4-C7 in keeping with changes of mild degenerative disc disease. Mild central disc herniation at C4-5 results in mild central canal stenosis with an AP diameter of the spinal canal of 7-8 mm and mild flattening of the thecal sac. The spinal canal is otherwise likely patent. Moderate bilateral neuroforaminal narrowing at C6-7 secondary to uncovertebral arthrosis. Neural foramina are otherwise widely patent. SOFT TISSUES: No prevertebral soft tissue swelling. IMPRESSION: 1. Mild central disc herniation at C4-5 resulting in mild central canal stenosis with an AP diameter of 7-8 mm and  mild flattening of the thecal sac. 2. Moderate bilateral neuroforaminal narrowing at C6-7 secondary to uncovertebral arthrosis. Electronically signed by: Dorethia Molt MD 03/21/2024 12:57 AM EDT RP Workstation: [id number]     Procedures   Medications Ordered in the ED  oxyCODONE -acetaminophen  (PERCOCET/ROXICET) 5-325 MG per tablet 1 tablet (1 tablet Oral Given 03/21/24 0032)                                    Medical Decision Making Amount and/or Complexity of Data Reviewed Radiology: ordered.  Risk Prescription drug management.  Presents with right sided neck pain with radiation of pain down the arm.  Patient with some tingling in fingers.  Patient does have preserved sensation and normal strength.  No red flags.  No known injury.  Patient completed a course of steroids without improvement.  CT scan performed.  Patient does have C4-5 disc herniation which may be causing some of the symptoms.  Probably does not completely explain finger numbness (question median nerve distribution) but negative Phalen's and Tinel, so no signs of carpal tunnel.  Does not require emergent neurosurgical intervention, provide analgesia and refer as outpatient.     Final diagnoses:  Cervical radiculopathy    ED Discharge Orders          Ordered    HYDROcodone -acetaminophen  (NORCO/VICODIN) 5-325 MG tablet  Every 4 hours PRN        03/21/24 0105               Haze Lonni PARAS, MD 03/21/24 0105

## 2024-03-22 ENCOUNTER — Telehealth (HOSPITAL_COMMUNITY): Payer: Self-pay | Admitting: Emergency Medicine

## 2024-03-22 MED ORDER — HYDROCODONE-ACETAMINOPHEN 5-325 MG PO TABS: 1.0000 | ORAL_TABLET | Freq: Four times a day (QID) | ORAL | 0 refills | Status: AC | PRN

## 2024-03-22 NOTE — Telephone Encounter (Signed)
 I received a call from the social worker requesting that patient's narcotics be resent to the pharmacy.  I reviewed patient's records, it appears that Norco was sent to [facility].

## 2024-03-25 DIAGNOSIS — M5412 Radiculopathy, cervical region: Secondary | ICD-10-CM | POA: Diagnosis not present

## 2024-03-26 ENCOUNTER — Other Ambulatory Visit: Payer: Self-pay | Admitting: Student

## 2024-03-26 DIAGNOSIS — M5412 Radiculopathy, cervical region: Secondary | ICD-10-CM

## 2024-03-30 ENCOUNTER — Encounter: Payer: Self-pay | Admitting: Student

## 2024-04-05 ENCOUNTER — Other Ambulatory Visit (HOSPITAL_COMMUNITY): Payer: Self-pay

## 2024-04-05 ENCOUNTER — Telehealth: Payer: Self-pay | Admitting: Pharmacy Technician

## 2024-04-05 NOTE — Progress Notes (Signed)
 Remote ICD Transmission

## 2024-04-05 NOTE — Telephone Encounter (Signed)
 Pharmacy Patient Advocate Encounter   Received notification from Pt Calls Messages that prior authorization for Wegovy  1MG  is required/requested.   Insurance verification completed.   The patient is insured through [place] .   Per test claim: PA required; PA submitted to above mentioned insurance via Latent Key/confirmation #/EOC [id number] Status is pending

## 2024-04-06 NOTE — Telephone Encounter (Signed)
 Pharmacy Patient Advocate Encounter  Received notification from [employer] that Prior Authorization for wegovy  has been DENIED.  Full denial letter will be uploaded to the media tab. See denial reason below.   PA #/Case ID/Reference #: [id number]

## 2024-04-08 ENCOUNTER — Telehealth: Payer: Self-pay | Admitting: Cardiology

## 2024-04-08 NOTE — Telephone Encounter (Signed)
 Pt requesting a c/b regarding whether he is able to have MRI done being that he has a device. Please advise

## 2024-04-08 NOTE — Telephone Encounter (Signed)
 Reviewed the patient's chart.   BSX SICD: PG= A219 Emblem MRI S-ICD Lead= 3401 Emblem S-ICD Electrode  Per [employer]:    The patient was last seen by Dr. Kennyth 12/23/23 and a note in the chart indicated:  # S/p ICD - H/o class I recall-generator and lead  Reviewed with Joey, BSX rep- ok to proceed with MRI.   Attempted to contact the patient to discuss and advise that an MRI clearance form would need to be sent to us  from the performing facility prior to testing. No answer- I left a message to please call back.

## 2024-04-09 ENCOUNTER — Telehealth: Payer: Self-pay | Admitting: Pharmacy Technician

## 2024-04-09 NOTE — Telephone Encounter (Signed)
      Faxed more information to [phone] as requested-but it was denied for 4 reasons. Reason 1- he is not 46 yrs-he is 43. Reason 2- provided updated medical records as requested. Reason 3-provided medical records. Reason 4-they said we have to provide records confirming he does not have heart failure but then says class4 -he does have hf but says modest class II heart failure-last echo sent in.

## 2024-04-12 ENCOUNTER — Other Ambulatory Visit (HOSPITAL_COMMUNITY): Payer: Self-pay

## 2024-04-13 ENCOUNTER — Other Ambulatory Visit (HOSPITAL_COMMUNITY): Payer: Self-pay

## 2024-04-13 NOTE — Telephone Encounter (Signed)
 Mychart message sent to Pt to advise ok to have MRI.  Device clinic fax # given.  Await further needs.

## 2024-04-14 ENCOUNTER — Other Ambulatory Visit (HOSPITAL_COMMUNITY): Payer: Self-pay

## 2024-04-15 ENCOUNTER — Other Ambulatory Visit (HOSPITAL_COMMUNITY): Payer: Self-pay

## 2024-04-16 ENCOUNTER — Other Ambulatory Visit (HOSPITAL_COMMUNITY): Payer: Self-pay

## 2024-04-16 NOTE — Telephone Encounter (Signed)
 I called and they did receive the appeal and they said we should hear 8 days from today  Verified our fax

## 2024-04-19 NOTE — Telephone Encounter (Signed)
  Pharmacy Patient Advocate Encounter  Received notification from [employer] that Prior Authorization for wegovy  has been DENIED.  Full denial letter will be uploaded to the media tab. See denial reason below.

## 2024-05-07 ENCOUNTER — Other Ambulatory Visit: Payer: Self-pay | Admitting: Cardiology

## 2024-05-07 DIAGNOSIS — I5022 Chronic systolic (congestive) heart failure: Secondary | ICD-10-CM

## 2024-05-07 DIAGNOSIS — I428 Other cardiomyopathies: Secondary | ICD-10-CM

## 2024-05-12 ENCOUNTER — Ambulatory Visit (INDEPENDENT_AMBULATORY_CARE_PROVIDER_SITE_OTHER): Payer: BLUE CROSS/BLUE SHIELD

## 2024-05-12 DIAGNOSIS — I428 Other cardiomyopathies: Secondary | ICD-10-CM

## 2024-05-14 LAB — CUP PACEART REMOTE DEVICE CHECK
Battery Remaining Percentage: 18 %
Battery Voltage: 18
Date Time Interrogation Session: 20251105194400
HighPow Impedance: 90 Ohm
Implantable Lead Connection Status: 753985
Implantable Lead Implant Date: 20180706
Implantable Lead Location: 753862
Implantable Lead Model: 3401
Implantable Lead Serial Number: 114025
Implantable Pulse Generator Implant Date: 20180706
Pulse Gen Serial Number: 228369

## 2024-05-15 ENCOUNTER — Ambulatory Visit: Payer: Self-pay | Admitting: Cardiology

## 2024-05-17 NOTE — Progress Notes (Signed)
 Remote ICD Transmission

## 2024-05-20 NOTE — CV Procedure (Signed)
  Device system confirmed to be MRI conditional, with implant date > 6 weeks ago, and no evidence of abandoned or epicardial leads in review of most recent CXR  Device last cleared by EP Provider: Charlies Arthur 05/20/24  Clearance is good through for 1 year as long as parameters remain stable at time of check. If pt undergoes a cardiac device procedure during that time, they should be re-cleared.   Tachy-therapies to be programmed off if applicable with device back to pre-MRI settings after completion of exam.  [employer] was available remotely to assist in programming recommendations.   Rocky Catalan, RT  05/20/2024 7:23 PM

## 2024-05-27 ENCOUNTER — Ambulatory Visit (HOSPITAL_COMMUNITY)
Admission: RE | Admit: 2024-05-27 | Discharge: 2024-05-27 | Disposition: A | Discharge status: Home / Self Care | Source: Ambulatory Visit | Attending: Student | Admitting: Student

## 2024-05-27 DIAGNOSIS — M5021 Other cervical disc displacement,  high cervical region: Secondary | ICD-10-CM | POA: Diagnosis not present

## 2024-05-27 DIAGNOSIS — M4802 Spinal stenosis, cervical region: Secondary | ICD-10-CM | POA: Diagnosis not present

## 2024-05-27 DIAGNOSIS — M4319 Spondylolisthesis, multiple sites in spine: Secondary | ICD-10-CM | POA: Diagnosis not present

## 2024-05-27 DIAGNOSIS — M50323 Other cervical disc degeneration at C6-C7 level: Secondary | ICD-10-CM | POA: Diagnosis not present

## 2024-05-27 DIAGNOSIS — M5412 Radiculopathy, cervical region: Secondary | ICD-10-CM | POA: Insufficient documentation

## 2024-05-27 NOTE — Progress Notes (Signed)
 Patient was monitored by this RN during MRI scan due to presence of a pacemaker. Cardiac rhythm was continuously monitored throughout the procedure. Prior to the start of the scan, the pacemaker was placed in MRI-safe mode by the MRI technician. Following the completion of the scan, the device was returned to its pre-MRI settings. Neurological status and orientation post-procedure were unchanged from baseline.   Pre-procedure Heart Rate (Prior to being placed in MRI safe mode): 80 Post-procedure Heart Rate (Once pacemaker is returned to baseline mode): 77

## 2024-06-01 DIAGNOSIS — Z6836 Body mass index (BMI) 36.0-36.9, adult: Secondary | ICD-10-CM | POA: Diagnosis not present

## 2024-06-01 DIAGNOSIS — M5412 Radiculopathy, cervical region: Secondary | ICD-10-CM | POA: Diagnosis not present

## 2024-06-14 DIAGNOSIS — M5412 Radiculopathy, cervical region: Secondary | ICD-10-CM | POA: Diagnosis not present

## 2024-08-11 ENCOUNTER — Ambulatory Visit

## 2024-08-12 ENCOUNTER — Inpatient Hospital Stay: Admission: RE | Admit: 2024-08-12 | Discharge: 2024-08-12 | Payer: Self-pay

## 2024-08-12 VITALS — BP 125/89 | HR 79 | Temp 98.2°F | Resp 18 | Wt 300.0 lb

## 2024-08-12 DIAGNOSIS — Z113 Encounter for screening for infections with a predominantly sexual mode of transmission: Secondary | ICD-10-CM

## 2024-08-12 DIAGNOSIS — Z9189 Other specified personal risk factors, not elsewhere classified: Secondary | ICD-10-CM | POA: Diagnosis not present

## 2024-08-12 DIAGNOSIS — R300 Dysuria: Secondary | ICD-10-CM | POA: Diagnosis not present

## 2024-08-12 LAB — CUP PACEART REMOTE DEVICE CHECK
Battery Remaining Percentage: 16 %
Date Time Interrogation Session: 20260204193600
HighPow Impedance: 100 Ohm
Implantable Lead Connection Status: 753985
Implantable Lead Implant Date: 20180706
Implantable Lead Location: 753862
Implantable Lead Model: 3401
Implantable Lead Serial Number: 114025
Implantable Pulse Generator Implant Date: 20180706
Pulse Gen Serial Number: 228369

## 2024-08-12 LAB — POCT URINE DIPSTICK
Bilirubin, UA: NEGATIVE
Blood, UA: NEGATIVE
Glucose, UA: NEGATIVE mg/dL
Ketones, POC UA: NEGATIVE mg/dL
Nitrite, UA: NEGATIVE
Protein Ur, POC: NEGATIVE mg/dL
Spec Grav, UA: 1.02
Urobilinogen, UA: 1 U/dL
pH, UA: 6

## 2024-08-12 MED ORDER — DOXYCYCLINE HYCLATE 100 MG PO CAPS: 100.0000 mg | ORAL_CAPSULE | Freq: Two times a day (BID) | ORAL | 0 refills | Status: AC

## 2024-08-12 MED ORDER — CEFTRIAXONE SODIUM 500 MG IJ SOLR
500.0000 mg | Freq: Once | INTRAMUSCULAR | Status: AC
  Administered 2024-08-12: 500 mg via INTRAMUSCULAR

## 2024-08-12 NOTE — ED Provider Notes (Signed)
 " [facility]    CSN: [id number] Arrival date & time: 08/12/24  1653      History   Chief Complaint Chief Complaint  Patient presents with   SEXUALLY TRANSMITTED DISEASE    Trich / chlamydia - Entered by patient    HPI [facility] is a 44 y.o. male.   44 year old, Nathan [facility], presents to urgent care for evaluation of possible STI exposure. Pt reports new partner and did not use protection, being stupid. He reports 2 days of dysuria and frequency.  No cloudy or odorous urine, hematuria, penile rash, discharge, scrotal/testicular pain or swelling, nausea, vomiting, fevers, abdominal, pelvic, back pain.  No aggravating or alleviating factors.  He has not tried anything for his symptoms.  He has a past medical history of chlamydia, trichomonas.  No history of gonorrhea, syphilis, HIV, HSV, diabetes, UTI, prostatitis. Patient has a past medical history of ischemic CVA, nonischemic cardiomyopathy, CHF, paroxysmal atrial fibrillation, status post defibrillator, PFO.  Discussed high risk sexual behavior, need to wear condoms with all sexual activity and risk for STI's; pt declines HIV or RPR at this time, we will send swab for STI testing and obtain UA in office for dysuria and frequency.   The history is provided by the patient. No language interpreter was used.    Past Medical History:  Diagnosis Date   CHF (congestive heart failure) (HCC)    Cough    a. PFTS 09/2012: restriction probable, further examinations recommended. Saw pulm 11/2012: placed on GERD rx and short course prednisone , planned to regroup.   CVA (cerebral vascular accident) (HCC) 01/23/2013   tPA administered; MRI brain showed small acute R MCA infarct   Nonischemic cardiomyopathy (HCC)    a. Identified 10/2012 by echo EF 20-25%. b. Echo 01/2013: EF 20-25%, TEE EF 15% with severe RV dysfunction as well.   Paroxysmal atrial fibrillation (HCC)    PFO (patent foramen ovale)    a. By TEE 01/2013.    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   Right shoulder pain 08/24/2014   TIA (transient ischemic attack) 05/20/2014   History of CVA (cerebrovascular accident)    Essential hypertension    Encounter for long-term (current) use of high-risk medication 01/27/2013   PFO (patent foramen ovale) 01/27/2013   Stroke, acute, embolic (HCC) 01/25/2013   Cough 10/20/2012   Chronic systolic congestive heart failure (HCC) 10/20/2012   Penile pain 05/19/2012   ATTENTION DEFICIT, W/HYPERACTIVITY 09/04/2006    Past Surgical History:  Procedure Laterality Date   NO PAST SURGERIES     SUBQ ICD IMPLANT N/A 01/10/2017   Procedure: SubQ ICD Implant;  Surgeon: Fernande Elspeth BROCKS, MD;  Location: [facility] INVASIVE CV LAB;  Service: Cardiovascular;  Laterality: N/A;   TEE WITHOUT CARDIOVERSION N/A 01/26/2013   Procedure: TRANSESOPHAGEAL ECHOCARDIOGRAM (TEE);  Surgeon: Redell GORMAN Shallow, MD;  Location: [facility] ENDOSCOPY;  Service: Cardiovascular;  Laterality: N/A;       Home Medications    Prior to Admission medications  Medication Sig Start Date End Date  Taking? Authorizing Provider  doxycycline  (VIBRAMYCIN ) 100 MG capsule Take 1 capsule (100 mg total) by mouth 2 (two) times daily for 7 days. 08/12/24 08/19/24 Yes Alayziah Tangeman, NP  carvedilol  (COREG ) 25 MG tablet TAKE 1 TABLET(25 MG) BY MOUTH TWICE DAILY WITH A MEAL 05/07/24   Kennyth Chew, MD  cyclobenzaprine  (FLEXERIL ) 10 MG tablet Take 1 tablet (10 mg total) by mouth at  bedtime. Patient not taking: Reported on 08/12/2024 03/10/24   Murrill, Samantha, FNP  furosemide  (LASIX ) 20 MG tablet Take 1 tablet (20 mg total) by mouth daily as needed. Patient not taking: Reported on 08/12/2024 12/23/23   Kennyth Chew, MD  gabapentin  (NEURONTIN ) 300 MG capsule Take 300 mg by mouth 3 (three) times daily.    [provider]  HYDROcodone -acetaminophen  (NORCO/VICODIN) 5-325 MG tablet Take 1 tablet by mouth every 4 (four) hours as needed for moderate pain (pain score 4-6). Patient not taking: Reported on 08/12/2024 03/21/24   Haze Lonni PARAS, MD  HYDROcodone -acetaminophen  (NORCO/VICODIN) 5-325 MG tablet Take 1 tablet by mouth every 6 (six) hours as needed. Patient not taking: Reported on 08/12/2024 03/22/24   Charlyn Sora, MD  methylPREDNISolone  (MEDROL  DOSEPAK) 4 MG TBPK tablet Take as directed Patient not taking: Reported on 08/12/2024 03/11/24   Murrill, Samantha, FNP  naproxen  (NAPROSYN ) 500 MG tablet Take 1 tablet (500 mg total) by mouth 2 (two) times daily with a meal. Take with food to avoid stomach upset. Do not take any additional NSAIDs while on this. You may take tylenol  in addition to this if needed for extra pain relief. Patient not taking: Reported on 08/12/2024 03/11/24   Iola Lukes, FNP  sacubitril -valsartan  (ENTRESTO ) 49-51 MG Take 1 tablet by mouth 2 (two) times daily. 12/23/23   Kennyth Chew, MD  semaglutide -weight management (WEGOVY ) 1 MG/0.5ML SOAJ SQ injection Inject 1 mg into the skin once a week. Patient not taking: Reported on 08/12/2024 03/19/24   Kennyth Chew, MD  spironolactone  (ALDACTONE ) 25 MG tablet Take 1 tablet (25 mg total) by mouth daily. 10/30/22   Fernande Elspeth BROCKS, MD    Family History Family History  Problem Relation Age of Onset   Sarcoidosis Mother    Heart Problems Mother        PPM implantation age 9    Social History Social History[1]   Allergies   Patient has no known allergies.   Review of Systems Review of  Systems  Constitutional:  Negative for fever.  Gastrointestinal:  Negative for abdominal pain, diarrhea, nausea and vomiting.  Genitourinary:  Positive for dysuria and frequency. Negative for genital sores and penile discharge.  Musculoskeletal:  Negative for back pain.  All other systems reviewed and are negative.    Physical Exam Triage Vital Signs ED Triage Vitals  Encounter Vitals Group     BP      Girls Systolic BP Percentile      Girls Diastolic BP Percentile      Boys Systolic BP Percentile      Boys Diastolic BP Percentile      Pulse      Resp      Temp      Temp src      SpO2      Weight      Height      Head Circumference      Peak Flow      Pain Score      Pain Loc      Pain Education  Exclude from Growth Chart    No data found.  Updated Vital Signs BP 125/89   Pulse 79   Temp 98.2 F (36.8 C)   Resp 18   Wt 300 lb (136.1 kg)   SpO2 98%   BMI 38.52 kg/m   Visual Acuity Right Eye Distance:   Left Eye Distance:   Bilateral Distance:    Right Eye Near:   Left Eye Near:    Bilateral Near:     Physical Exam Vitals and nursing note reviewed.  Genitourinary:    Comments: Deferred ,pt self swabbed,UA obtained after swab collected Neurological:     General: No focal deficit present.     Mental Status: He is alert and oriented to person, place, and time.     GCS: GCS eye subscore is 4. GCS verbal subscore is 5. GCS motor subscore is 6.  Psychiatric:        Attention and Perception: Attention normal.        Mood and Affect: Mood normal.        Speech: Speech normal.        Behavior: Behavior normal. Behavior is cooperative.      UC Treatments / Results  Labs (all labs ordered are listed, but only abnormal results are displayed) Labs Reviewed  POCT URINE DIPSTICK - Abnormal; Notable for the following components:      Result Value   Leukocytes, UA Small (1+) (*)    All other components within normal limits  URINE CULTURE  CYTOLOGY,  (ORAL, ANAL, URETHRAL) ANCILLARY ONLY    EKG   Radiology CUP PACEART REMOTE DEVICE CHECK Result Date: 08/12/2024 S-ICD: Scheduled remote reviewed. Normal device function.  Presenting rhythm: VS Next remote transmission per protocol. ML, CVRSSensing Configuration: Primary Gain Setting: 1X Post Shock Pacing: ON   Procedures Procedures (including critical care time)  Medications Ordered in UC Medications  cefTRIAXone  (ROCEPHIN ) injection 500 mg (500 mg Intramuscular Given 08/12/24 1743)    Initial Impression / Assessment and Plan / UC Course  I have reviewed the triage vital signs and the nursing notes.  Pertinent labs & imaging results that were available during my care of the patient were reviewed by me and considered in my medical decision making (see chart for details).    Discussed exam findings and plan of care with patient :you were given Rocephin  500 mg IM for (dysuria, + leukocytes in urine), pending urine culture results for further treatment if needed for UTI. Most likely you have contracted a sexual transmitted disease(labs pending). We will send in doxycycline : take as directed for chlamydia. Please use condoms with all future sexual activity,safe sex precautions Avoid sexual activity until results known, treatment for yourself and partner(s) completed; if are urine culture or STI screening is positive and you need additional STI or UTI treatment you will be notified, if your test is negative,you will not be notified. Please consider HIV and syphilis testing, can go to local health department/PCP for further STI testing,treatment options as well.  Patient verbalized understanding to this provider.  Ddx: High risk sexual activity, possible STI, UTI Final Clinical Impressions(s) / UC Diagnoses   Final diagnoses:  Routine screening for STI (sexually transmitted infection)  At high risk for sexually transmitted infection  Dysuria     Discharge Instructions      You were  given Rocephin  500 mg IM for (dysuria, + leukocytes in urine), pending urine culture results for further treatment if needed for UTI. Most likely you  have contracted a sexual transmitted disease(labs pending). We will send in doxycycline : take as directed for chlamydia. Please use condoms with all future sexual activity,safe sex precautions Avoid sexual activity until results known, treatment for yourself and partner(s) completed; if are urine culture or STI screening is positive and you need additional STI or UTI treatment you will be notified, if your test is negative,you will not be notified. Please consider HIV and syphilis testing, can go to local health department/PCP for further STI testing,treatment options as well.     ED Prescriptions     Medication Sig Dispense Auth. Provider   doxycycline  (VIBRAMYCIN ) 100 MG capsule Take 1 capsule (100 mg total) by mouth 2 (two) times daily for 7 days. 14 capsule Nathon Stefanski, NP      PDMP not reviewed this encounter.     [1]  Social History Tobacco Use   Smoking status: Never   Smokeless tobacco: Never  Vaping Use   Vaping status: Never Used  Substance Use Topics   Alcohol use: Yes    Comment: rare   Drug use: No     Mahmoud Blazejewski, Rilla, NP 08/12/24 1814  "

## 2024-08-12 NOTE — ED Triage Notes (Signed)
 Patient to Urgent Care with complaints of  dysuria. Reports feeling irritated.  Symptoms x2 days. Reports his partner had chlamydia and trich. Requests std testing.

## 2024-08-12 NOTE — Discharge Instructions (Addendum)
 You were given Rocephin  500 mg IM for (dysuria, + leukocytes in urine), pending urine culture results for further treatment if needed for UTI. Most likely you have contracted a sexual transmitted disease(labs pending). We will send in doxycycline : take as directed for chlamydia. Please use condoms with all future sexual activity,safe sex precautions Avoid sexual activity until results known, treatment for yourself and partner(s) completed; if are urine culture or STI screening is positive and you need additional STI or UTI treatment you will be notified, if your test is negative,you will not be notified. Please consider HIV and syphilis testing, can go to local health department/PCP for further STI testing,treatment options as well.

## 2024-08-13 ENCOUNTER — Telehealth: Payer: Self-pay | Admitting: Family Medicine

## 2024-08-13 LAB — CYTOLOGY, (ORAL, ANAL, URETHRAL) ANCILLARY ONLY
Chlamydia: NEGATIVE
Comment: NEGATIVE
Comment: NEGATIVE
Comment: NORMAL
Neisseria Gonorrhea: NEGATIVE
Trichomonas: POSITIVE — AB

## 2024-08-13 MED ORDER — METRONIDAZOLE 500 MG PO TABS: 2000.0000 mg | ORAL_TABLET | Freq: Once | ORAL | 0 refills | Status: AC

## 2024-08-13 MED ORDER — METRONIDAZOLE 500 MG PO TABS: 2000.0000 mg | ORAL_TABLET | Freq: Once | ORAL | 0 refills | Status: DC

## 2024-08-13 NOTE — Telephone Encounter (Signed)
 Patient called the urgent care as his test results are positive for trichomonas.  Prescription called to his preferred pharmacy.  RN will need to notify the health department.    Caprice Porteous, DO

## 2024-11-10 ENCOUNTER — Ambulatory Visit

## 2025-02-09 ENCOUNTER — Ambulatory Visit

## 2025-05-11 ENCOUNTER — Ambulatory Visit
